# Patient Record
Sex: Male | Born: 1960
Health system: Southern US, Community
[De-identification: ages and names within clinical notes are randomized; demographics above are authoritative.]

## PROBLEM LIST (undated history)

## (undated) DIAGNOSIS — I4891 Unspecified atrial fibrillation: Secondary | ICD-10-CM

## (undated) DIAGNOSIS — I4819 Other persistent atrial fibrillation: Secondary | ICD-10-CM

## (undated) DIAGNOSIS — N529 Male erectile dysfunction, unspecified: Secondary | ICD-10-CM

## (undated) DIAGNOSIS — R072 Precordial pain: Secondary | ICD-10-CM

## (undated) DIAGNOSIS — Z79899 Other long term (current) drug therapy: Secondary | ICD-10-CM

## (undated) DIAGNOSIS — Z5181 Encounter for therapeutic drug level monitoring: Secondary | ICD-10-CM

## (undated) DIAGNOSIS — K219 Gastro-esophageal reflux disease without esophagitis: Secondary | ICD-10-CM

## (undated) DIAGNOSIS — E876 Hypokalemia: Secondary | ICD-10-CM

## (undated) DIAGNOSIS — J302 Other seasonal allergic rhinitis: Secondary | ICD-10-CM

## (undated) DIAGNOSIS — I10 Essential (primary) hypertension: Secondary | ICD-10-CM

## (undated) DIAGNOSIS — R42 Dizziness and giddiness: Secondary | ICD-10-CM

## (undated) DIAGNOSIS — I4892 Unspecified atrial flutter: Secondary | ICD-10-CM

## (undated) DIAGNOSIS — E785 Hyperlipidemia, unspecified: Secondary | ICD-10-CM

## (undated) DIAGNOSIS — M1990 Unspecified osteoarthritis, unspecified site: Secondary | ICD-10-CM

## (undated) DIAGNOSIS — K648 Other hemorrhoids: Secondary | ICD-10-CM

## (undated) DIAGNOSIS — R001 Bradycardia, unspecified: Secondary | ICD-10-CM

## (undated) DIAGNOSIS — G4730 Sleep apnea, unspecified: Secondary | ICD-10-CM

## (undated) HISTORY — DX: Other seasonal allergic rhinitis: J30.2

## (undated) HISTORY — DX: Hypokalemia: E87.6

## (undated) HISTORY — DX: Unspecified atrial flutter: I48.92

## (undated) HISTORY — DX: Sleep apnea, unspecified: G47.30

## (undated) HISTORY — PX: ELBOW SURGERY: SHX618

## (undated) HISTORY — DX: Hyperlipidemia, unspecified: E78.5

## (undated) HISTORY — DX: Essential (primary) hypertension: I10

## (undated) HISTORY — DX: Gastro-esophageal reflux disease without esophagitis: K21.9

## (undated) HISTORY — PX: CHALAZION EXCISION: SHX213

## (undated) HISTORY — DX: Dizziness and giddiness: R42

## (undated) HISTORY — DX: Unspecified atrial fibrillation: I48.91

## (undated) HISTORY — DX: Other hemorrhoids: K64.8

## (undated) HISTORY — DX: Male erectile dysfunction, unspecified: N52.9

## (undated) HISTORY — DX: Precordial pain: R07.2

---

## 1998-03-31 ENCOUNTER — Encounter: Payer: Self-pay | Admitting: Family Medicine

## 1998-03-31 ENCOUNTER — Ambulatory Visit (HOSPITAL_COMMUNITY): Admission: RE | Admit: 1998-03-31 | Discharge: 1998-03-31 | Payer: Self-pay | Admitting: Family Medicine

## 1999-06-04 ENCOUNTER — Encounter: Payer: Self-pay | Admitting: Emergency Medicine

## 1999-06-04 ENCOUNTER — Emergency Department (HOSPITAL_COMMUNITY): Admission: EM | Admit: 1999-06-04 | Discharge: 1999-06-04 | Payer: Self-pay | Admitting: Emergency Medicine

## 2003-08-31 LAB — HM COLONOSCOPY: HM Colonoscopy: NORMAL

## 2004-07-13 ENCOUNTER — Ambulatory Visit: Payer: Self-pay | Admitting: Internal Medicine

## 2004-07-20 ENCOUNTER — Ambulatory Visit: Payer: Self-pay | Admitting: Internal Medicine

## 2004-07-31 ENCOUNTER — Ambulatory Visit: Payer: Self-pay

## 2004-08-06 ENCOUNTER — Ambulatory Visit: Payer: Self-pay | Admitting: Internal Medicine

## 2004-08-10 ENCOUNTER — Ambulatory Visit: Payer: Self-pay

## 2004-08-10 ENCOUNTER — Ambulatory Visit: Payer: Self-pay | Admitting: Internal Medicine

## 2004-08-14 ENCOUNTER — Ambulatory Visit: Payer: Self-pay | Admitting: Cardiology

## 2004-08-22 ENCOUNTER — Ambulatory Visit: Payer: Self-pay | Admitting: Cardiovascular Disease

## 2004-08-29 ENCOUNTER — Ambulatory Visit: Payer: Self-pay | Admitting: Cardiology

## 2004-09-05 ENCOUNTER — Ambulatory Visit: Payer: Self-pay | Admitting: *Deleted

## 2004-09-19 ENCOUNTER — Ambulatory Visit: Payer: Self-pay | Admitting: Internal Medicine

## 2004-10-03 ENCOUNTER — Ambulatory Visit: Payer: Self-pay | Admitting: *Deleted

## 2004-10-17 ENCOUNTER — Ambulatory Visit: Payer: Self-pay | Admitting: Cardiology

## 2004-11-13 ENCOUNTER — Ambulatory Visit: Payer: Self-pay | Admitting: Internal Medicine

## 2004-11-15 ENCOUNTER — Ambulatory Visit (HOSPITAL_COMMUNITY): Admission: RE | Admit: 2004-11-15 | Discharge: 2004-11-15 | Payer: Self-pay | Admitting: Internal Medicine

## 2004-11-20 ENCOUNTER — Ambulatory Visit: Payer: Self-pay | Admitting: Internal Medicine

## 2004-12-06 ENCOUNTER — Ambulatory Visit: Payer: Self-pay | Admitting: Internal Medicine

## 2004-12-13 ENCOUNTER — Ambulatory Visit: Payer: Self-pay | Admitting: Internal Medicine

## 2004-12-20 ENCOUNTER — Ambulatory Visit: Payer: Self-pay

## 2004-12-27 ENCOUNTER — Ambulatory Visit: Payer: Self-pay | Admitting: Internal Medicine

## 2005-01-01 ENCOUNTER — Ambulatory Visit: Payer: Self-pay | Admitting: Internal Medicine

## 2005-01-08 ENCOUNTER — Ambulatory Visit: Payer: Self-pay | Admitting: Internal Medicine

## 2005-02-19 ENCOUNTER — Ambulatory Visit: Payer: Self-pay | Admitting: Internal Medicine

## 2005-03-05 ENCOUNTER — Ambulatory Visit: Payer: Self-pay | Admitting: *Deleted

## 2005-03-26 ENCOUNTER — Ambulatory Visit: Payer: Self-pay | Admitting: Cardiology

## 2005-04-08 ENCOUNTER — Ambulatory Visit: Payer: Self-pay | Admitting: Internal Medicine

## 2005-04-09 ENCOUNTER — Ambulatory Visit: Payer: Self-pay | Admitting: Cardiology

## 2005-04-09 ENCOUNTER — Ambulatory Visit: Payer: Self-pay

## 2005-04-19 ENCOUNTER — Ambulatory Visit: Payer: Self-pay | Admitting: Cardiology

## 2005-05-24 ENCOUNTER — Ambulatory Visit: Payer: Self-pay | Admitting: Internal Medicine

## 2005-06-07 ENCOUNTER — Ambulatory Visit: Payer: Self-pay | Admitting: Cardiology

## 2005-06-21 ENCOUNTER — Ambulatory Visit: Payer: Self-pay | Admitting: Cardiology

## 2005-07-12 ENCOUNTER — Ambulatory Visit: Payer: Self-pay | Admitting: Cardiovascular Disease

## 2005-08-09 ENCOUNTER — Ambulatory Visit: Payer: Self-pay | Admitting: Cardiology

## 2005-09-05 ENCOUNTER — Ambulatory Visit: Payer: Self-pay | Admitting: Cardiology

## 2005-11-18 ENCOUNTER — Ambulatory Visit: Payer: Self-pay | Admitting: Cardiology

## 2005-12-10 ENCOUNTER — Ambulatory Visit: Payer: Self-pay | Admitting: Internal Medicine

## 2005-12-16 ENCOUNTER — Ambulatory Visit: Payer: Self-pay | Admitting: Cardiology

## 2005-12-31 ENCOUNTER — Ambulatory Visit: Payer: Self-pay | Admitting: Internal Medicine

## 2006-01-27 ENCOUNTER — Ambulatory Visit: Payer: Self-pay | Admitting: Cardiology

## 2006-02-24 ENCOUNTER — Ambulatory Visit: Payer: Self-pay | Admitting: Cardiology

## 2006-03-14 ENCOUNTER — Ambulatory Visit: Payer: Self-pay | Admitting: Cardiology

## 2006-03-28 ENCOUNTER — Ambulatory Visit: Payer: Self-pay | Admitting: Internal Medicine

## 2006-05-07 ENCOUNTER — Ambulatory Visit: Payer: Self-pay | Admitting: Cardiology

## 2006-07-17 ENCOUNTER — Ambulatory Visit: Payer: Self-pay | Admitting: Internal Medicine

## 2006-07-19 ENCOUNTER — Ambulatory Visit: Payer: Self-pay | Admitting: Family Medicine

## 2006-08-11 ENCOUNTER — Ambulatory Visit: Payer: Self-pay | Admitting: Internal Medicine

## 2006-09-05 ENCOUNTER — Ambulatory Visit: Payer: Self-pay | Admitting: Internal Medicine

## 2006-09-05 ENCOUNTER — Ambulatory Visit: Payer: Self-pay | Admitting: Cardiology

## 2006-10-15 ENCOUNTER — Ambulatory Visit: Payer: Self-pay | Admitting: Internal Medicine

## 2006-10-21 ENCOUNTER — Ambulatory Visit (HOSPITAL_BASED_OUTPATIENT_CLINIC_OR_DEPARTMENT_OTHER): Admission: RE | Admit: 2006-10-21 | Discharge: 2006-10-21 | Payer: Self-pay | Admitting: General Surgery

## 2006-10-21 ENCOUNTER — Encounter (INDEPENDENT_AMBULATORY_CARE_PROVIDER_SITE_OTHER): Payer: Self-pay | Admitting: General Surgery

## 2006-11-02 HISTORY — PX: INGUINAL HERNIA REPAIR: SUR1180

## 2006-11-03 ENCOUNTER — Ambulatory Visit: Payer: Self-pay | Admitting: Cardiovascular Disease

## 2006-12-09 ENCOUNTER — Ambulatory Visit: Payer: Self-pay | Admitting: Internal Medicine

## 2006-12-09 LAB — CONVERTED CEMR LAB
ALT: 27 units/L (ref 0–53)
AST: 24 units/L (ref 0–37)
Albumin: 4 g/dL (ref 3.5–5.2)
Alkaline Phosphatase: 43 units/L (ref 39–117)
BUN: 14 mg/dL (ref 6–23)
Basophils Absolute: 0.1 10*3/uL (ref 0.0–0.1)
Basophils Relative: 0.7 % (ref 0.0–1.0)
Bilirubin Urine: NEGATIVE
Bilirubin, Direct: 0.2 mg/dL (ref 0.0–0.3)
CO2: 31 meq/L (ref 19–32)
Calcium: 9.1 mg/dL (ref 8.4–10.5)
Chloride: 107 meq/L (ref 96–112)
Cholesterol: 150 mg/dL (ref 0–200)
Creatinine, Ser: 1.2 mg/dL (ref 0.4–1.5)
Direct LDL: 59.4 mg/dL
Eosinophils Absolute: 0.2 10*3/uL (ref 0.0–0.6)
Eosinophils Relative: 1.7 % (ref 0.0–5.0)
GFR calc Af Amer: 83 mL/min
GFR calc non Af Amer: 69 mL/min
Glucose, Bld: 89 mg/dL (ref 70–99)
HCT: 38.1 % — ABNORMAL LOW (ref 39.0–52.0)
HDL: 44.4 mg/dL (ref >39.0)
Hemoglobin, Urine: NEGATIVE
Hemoglobin: 13.3 g/dL (ref 13.0–17.0)
Ketones, ur: NEGATIVE mg/dL
Leukocytes, UA: NEGATIVE
Lymphocytes Relative: 25.5 % (ref 12.0–46.0)
MCHC: 34.9 g/dL (ref 30.0–36.0)
MCV: 87.7 fL (ref 78.0–100.0)
Monocytes Absolute: 1 10*3/uL — ABNORMAL HIGH (ref 0.2–0.7)
Monocytes Relative: 10.1 % (ref 3.0–11.0)
Neutro Abs: 6.4 10*3/uL (ref 1.4–7.7)
Neutrophils Relative %: 62 % (ref 43.0–77.0)
Nitrite: NEGATIVE
PSA: 0.38 ng/mL (ref 0.10–4.00)
Platelets: 233 10*3/uL (ref 150–400)
Potassium: 3.5 meq/L (ref 3.5–5.1)
RBC: 4.34 M/uL (ref 4.22–5.81)
RDW: 12.3 % (ref 11.5–14.6)
Sodium: 140 meq/L (ref 135–145)
Specific Gravity, Urine: 1.02 (ref 1.000–1.03)
TSH: 1.55 microintl units/mL (ref 0.35–5.50)
Total Bilirubin: 1.3 mg/dL — ABNORMAL HIGH (ref 0.3–1.2)
Total CHOL/HDL Ratio: 3.4
Total Protein, Urine: NEGATIVE mg/dL
Total Protein: 7.3 g/dL (ref 6.0–8.3)
Triglycerides: 201 mg/dL (ref 0–149)
Urine Glucose: NEGATIVE mg/dL
Urobilinogen, UA: 1 (ref 0.0–1.0)
VLDL: 40 mg/dL (ref 0–40)
WBC: 10.3 10*3/uL (ref 4.5–10.5)
pH: 8 (ref 5.0–8.0)

## 2006-12-12 ENCOUNTER — Ambulatory Visit: Payer: Self-pay | Admitting: Internal Medicine

## 2007-01-29 ENCOUNTER — Ambulatory Visit: Payer: Self-pay | Admitting: Cardiology

## 2007-04-14 ENCOUNTER — Ambulatory Visit: Payer: Self-pay | Admitting: Internal Medicine

## 2007-04-28 ENCOUNTER — Ambulatory Visit: Payer: Self-pay | Admitting: Cardiovascular Disease

## 2007-05-27 ENCOUNTER — Ambulatory Visit: Payer: Self-pay | Admitting: Internal Medicine

## 2007-05-27 LAB — CONVERTED CEMR LAB
ALT: 49 units/L (ref 0–53)
AST: 25 units/L (ref 0–37)
Albumin: 4.1 g/dL (ref 3.5–5.2)
Alkaline Phosphatase: 44 units/L (ref 39–117)
BUN: 19 mg/dL (ref 6–23)
Bilirubin, Direct: 0.2 mg/dL (ref 0.0–0.3)
CO2: 30 meq/L (ref 19–32)
Calcium: 9.3 mg/dL (ref 8.4–10.5)
Chloride: 102 meq/L (ref 96–112)
Cholesterol: 203 mg/dL (ref 0–200)
Creatinine, Ser: 1 mg/dL (ref 0.4–1.5)
Direct LDL: 94.7 mg/dL
GFR calc Af Amer: 103 mL/min
GFR calc non Af Amer: 85 mL/min
Glucose, Bld: 104 mg/dL — ABNORMAL HIGH (ref 70–99)
HDL: 67.3 mg/dL (ref >39.0)
Potassium: 3.5 meq/L (ref 3.5–5.1)
Sodium: 138 meq/L (ref 135–145)
Total Bilirubin: 1.5 mg/dL — ABNORMAL HIGH (ref 0.3–1.2)
Total CHOL/HDL Ratio: 3
Total Protein: 7.9 g/dL (ref 6.0–8.3)
Triglycerides: 91 mg/dL (ref 0–149)
VLDL: 18 mg/dL (ref 0–40)

## 2007-07-13 ENCOUNTER — Ambulatory Visit: Payer: Self-pay | Admitting: Internal Medicine

## 2007-08-10 ENCOUNTER — Ambulatory Visit: Payer: Self-pay | Admitting: Cardiology

## 2007-09-07 ENCOUNTER — Ambulatory Visit: Payer: Self-pay | Admitting: Cardiovascular Disease

## 2007-09-10 ENCOUNTER — Ambulatory Visit: Payer: Self-pay | Admitting: Internal Medicine

## 2007-09-21 ENCOUNTER — Emergency Department (HOSPITAL_COMMUNITY): Admission: EM | Admit: 2007-09-21 | Discharge: 2007-09-21 | Payer: Self-pay | Admitting: Emergency Medicine

## 2007-10-23 ENCOUNTER — Telehealth (INDEPENDENT_AMBULATORY_CARE_PROVIDER_SITE_OTHER): Payer: Self-pay | Admitting: *Deleted

## 2007-12-11 ENCOUNTER — Ambulatory Visit: Payer: Self-pay | Admitting: Internal Medicine

## 2007-12-11 LAB — CONVERTED CEMR LAB
ALT: 37 units/L (ref 0–53)
AST: 23 units/L (ref 0–37)
Albumin: 4.1 g/dL (ref 3.5–5.2)
Alkaline Phosphatase: 39 units/L (ref 39–117)
BUN: 20 mg/dL (ref 6–23)
Basophils Absolute: 0.1 10*3/uL (ref 0.0–0.1)
Basophils Relative: 1 % (ref 0.0–1.0)
Bilirubin Urine: NEGATIVE
Bilirubin, Direct: 0.2 mg/dL (ref 0.0–0.3)
CO2: 28 meq/L (ref 19–32)
Calcium: 9.2 mg/dL (ref 8.4–10.5)
Chloride: 104 meq/L (ref 96–112)
Cholesterol: 135 mg/dL (ref 0–200)
Creatinine, Ser: 1.2 mg/dL (ref 0.4–1.5)
Eosinophils Absolute: 0.2 10*3/uL (ref 0.0–0.7)
Eosinophils Relative: 1.7 % (ref 0.0–5.0)
GFR calc Af Amer: 83 mL/min
GFR calc non Af Amer: 69 mL/min
Glucose, Bld: 96 mg/dL (ref 70–99)
HCT: 38.6 % — ABNORMAL LOW (ref 39.0–52.0)
HDL: 43.5 mg/dL (ref >39.0)
Hemoglobin: 13.2 g/dL (ref 13.0–17.0)
Ketones, ur: NEGATIVE mg/dL
LDL Cholesterol: 69 mg/dL (ref 0–99)
Leukocytes, UA: NEGATIVE
Lymphocytes Relative: 24 % (ref 12.0–46.0)
MCHC: 34.2 g/dL (ref 30.0–36.0)
MCV: 89.3 fL (ref 78.0–100.0)
Monocytes Absolute: 0.8 10*3/uL (ref 0.1–1.0)
Monocytes Relative: 8.7 % (ref 3.0–12.0)
Neutro Abs: 5.7 10*3/uL (ref 1.4–7.7)
Neutrophils Relative %: 64.6 % (ref 43.0–77.0)
Nitrite: NEGATIVE
PSA: 0.4 ng/mL (ref 0.10–4.00)
Platelets: 215 10*3/uL (ref 150–400)
Potassium: 3.4 meq/L — ABNORMAL LOW (ref 3.5–5.1)
RBC: 4.32 M/uL (ref 4.22–5.81)
RDW: 12.4 % (ref 11.5–14.6)
Sodium: 138 meq/L (ref 135–145)
Specific Gravity, Urine: >=1.03 (ref 1.000–1.03)
TSH: 0.9 microintl units/mL (ref 0.35–5.50)
Total Bilirubin: 1.3 mg/dL — ABNORMAL HIGH (ref 0.3–1.2)
Total CHOL/HDL Ratio: 3.1
Total Protein: 7.2 g/dL (ref 6.0–8.3)
Triglycerides: 113 mg/dL (ref 0–149)
Urine Glucose: NEGATIVE mg/dL
Urobilinogen, UA: 1 (ref 0.0–1.0)
VLDL: 23 mg/dL (ref 0–40)
WBC: 9 10*3/uL (ref 4.5–10.5)
pH: 6 (ref 5.0–8.0)

## 2007-12-15 ENCOUNTER — Ambulatory Visit: Payer: Self-pay | Admitting: Internal Medicine

## 2007-12-15 DIAGNOSIS — E876 Hypokalemia: Secondary | ICD-10-CM | POA: Insufficient documentation

## 2007-12-15 DIAGNOSIS — I10 Essential (primary) hypertension: Secondary | ICD-10-CM | POA: Insufficient documentation

## 2007-12-15 DIAGNOSIS — E785 Hyperlipidemia, unspecified: Secondary | ICD-10-CM | POA: Insufficient documentation

## 2007-12-15 HISTORY — DX: Hyperlipidemia, unspecified: E78.5

## 2007-12-15 HISTORY — DX: Essential (primary) hypertension: I10

## 2007-12-15 HISTORY — DX: Hypokalemia: E87.6

## 2007-12-17 ENCOUNTER — Ambulatory Visit: Payer: Self-pay | Admitting: Cardiology

## 2007-12-31 ENCOUNTER — Ambulatory Visit: Payer: Self-pay | Admitting: Internal Medicine

## 2007-12-31 ENCOUNTER — Telehealth (INDEPENDENT_AMBULATORY_CARE_PROVIDER_SITE_OTHER): Payer: Self-pay | Admitting: *Deleted

## 2008-01-01 ENCOUNTER — Ambulatory Visit: Payer: Self-pay | Admitting: Internal Medicine

## 2008-01-01 DIAGNOSIS — R1013 Epigastric pain: Secondary | ICD-10-CM | POA: Insufficient documentation

## 2008-01-01 LAB — CONVERTED CEMR LAB: H Pylori IgG: NEGATIVE

## 2008-01-12 ENCOUNTER — Ambulatory Visit: Payer: Self-pay | Admitting: Internal Medicine

## 2008-01-26 ENCOUNTER — Ambulatory Visit: Payer: Self-pay | Admitting: Cardiology

## 2008-02-16 ENCOUNTER — Ambulatory Visit: Payer: Self-pay | Admitting: Cardiology

## 2008-03-08 ENCOUNTER — Ambulatory Visit: Payer: Self-pay | Admitting: Cardiovascular Disease

## 2008-03-08 ENCOUNTER — Ambulatory Visit: Payer: Self-pay | Admitting: Internal Medicine

## 2008-03-22 ENCOUNTER — Encounter: Payer: Self-pay | Admitting: Internal Medicine

## 2008-04-05 ENCOUNTER — Ambulatory Visit: Payer: Self-pay | Admitting: Internal Medicine

## 2008-04-14 ENCOUNTER — Ambulatory Visit: Payer: Self-pay | Admitting: Cardiology

## 2008-05-03 ENCOUNTER — Ambulatory Visit: Payer: Self-pay | Admitting: Cardiology

## 2008-05-24 ENCOUNTER — Ambulatory Visit: Payer: Self-pay | Admitting: Internal Medicine

## 2008-06-08 ENCOUNTER — Ambulatory Visit: Payer: Self-pay | Admitting: Cardiology

## 2008-06-27 ENCOUNTER — Ambulatory Visit: Payer: Self-pay | Admitting: Internal Medicine

## 2008-07-06 ENCOUNTER — Ambulatory Visit: Payer: Self-pay | Admitting: Internal Medicine

## 2008-07-06 DIAGNOSIS — R42 Dizziness and giddiness: Secondary | ICD-10-CM

## 2008-07-06 HISTORY — DX: Dizziness and giddiness: R42

## 2008-07-11 ENCOUNTER — Ambulatory Visit: Payer: Self-pay | Admitting: Cardiology

## 2008-08-01 ENCOUNTER — Ambulatory Visit: Payer: Self-pay | Admitting: Cardiology

## 2008-08-29 ENCOUNTER — Ambulatory Visit: Payer: Self-pay | Admitting: Cardiology

## 2008-09-12 ENCOUNTER — Ambulatory Visit: Payer: Self-pay | Admitting: Internal Medicine

## 2008-09-12 ENCOUNTER — Encounter: Payer: Self-pay | Admitting: Internal Medicine

## 2008-09-26 ENCOUNTER — Ambulatory Visit: Payer: Self-pay | Admitting: Cardiology

## 2008-10-11 ENCOUNTER — Ambulatory Visit: Payer: Self-pay | Admitting: Internal Medicine

## 2008-11-01 ENCOUNTER — Ambulatory Visit: Payer: Self-pay | Admitting: Cardiology

## 2008-11-01 ENCOUNTER — Encounter: Payer: Self-pay | Admitting: *Deleted

## 2008-11-01 LAB — CONVERTED CEMR LAB
POC INR: 2.8
Protime: 20.4

## 2008-11-22 ENCOUNTER — Ambulatory Visit: Payer: Self-pay | Admitting: Cardiovascular Disease

## 2008-11-22 LAB — CONVERTED CEMR LAB
POC INR: 2.7
Protime: 19.8

## 2008-12-07 ENCOUNTER — Encounter: Payer: Self-pay | Admitting: *Deleted

## 2008-12-20 ENCOUNTER — Ambulatory Visit: Payer: Self-pay | Admitting: Cardiology

## 2008-12-20 LAB — CONVERTED CEMR LAB: POC INR: 3.1

## 2009-01-09 ENCOUNTER — Ambulatory Visit: Payer: Self-pay | Admitting: Internal Medicine

## 2009-01-09 LAB — CONVERTED CEMR LAB
ALT: 31 units/L (ref 0–53)
AST: 23 units/L (ref 0–37)
Albumin: 4.1 g/dL (ref 3.5–5.2)
Alkaline Phosphatase: 53 units/L (ref 39–117)
BUN: 20 mg/dL (ref 6–23)
Basophils Absolute: 0 10*3/uL (ref 0.0–0.1)
Basophils Relative: 0.3 % (ref 0.0–3.0)
Bilirubin Urine: NEGATIVE
CO2: 28 meq/L (ref 19–32)
Calcium: 8.9 mg/dL (ref 8.4–10.5)
Chloride: 110 meq/L (ref 96–112)
Creatinine, Ser: 1 mg/dL (ref 0.4–1.5)
Eosinophils Relative: 1.9 % (ref 0.0–5.0)
HCT: 42 % (ref 39.0–52.0)
Hemoglobin: 14.4 g/dL (ref 13.0–17.0)
Ketones, ur: NEGATIVE mg/dL
LDL Cholesterol: 63 mg/dL (ref 0–99)
Lymphocytes Relative: 26.8 % (ref 12.0–46.0)
Lymphs Abs: 2.4 10*3/uL (ref 0.7–4.0)
Monocytes Relative: 10 % (ref 3.0–12.0)
Neutro Abs: 5.5 10*3/uL (ref 1.4–7.7)
RBC: 4.73 M/uL (ref 4.22–5.81)
Specific Gravity, Urine: >=1.03 (ref 1.000–1.030)
Total CHOL/HDL Ratio: 2
Urine Glucose: NEGATIVE mg/dL
Urobilinogen, UA: 0.2 (ref 0.0–1.0)
WBC: 9 10*3/uL (ref 4.5–10.5)

## 2009-01-10 ENCOUNTER — Encounter: Payer: Self-pay | Admitting: Cardiology

## 2009-01-12 ENCOUNTER — Ambulatory Visit: Payer: Self-pay | Admitting: Internal Medicine

## 2009-01-17 ENCOUNTER — Ambulatory Visit: Payer: Self-pay | Admitting: Cardiovascular Disease

## 2009-01-17 LAB — CONVERTED CEMR LAB: POC INR: 2.7

## 2009-02-13 ENCOUNTER — Ambulatory Visit: Payer: Self-pay | Admitting: Cardiovascular Disease

## 2009-02-13 LAB — CONVERTED CEMR LAB: POC INR: 2.6

## 2009-03-13 ENCOUNTER — Ambulatory Visit: Payer: Self-pay | Admitting: Internal Medicine

## 2009-04-12 ENCOUNTER — Ambulatory Visit: Payer: Self-pay | Admitting: Internal Medicine

## 2009-04-12 LAB — CONVERTED CEMR LAB: POC INR: 2.6

## 2009-05-10 ENCOUNTER — Ambulatory Visit: Payer: Self-pay | Admitting: Cardiology

## 2009-05-10 LAB — CONVERTED CEMR LAB: POC INR: 2.1

## 2009-05-25 ENCOUNTER — Ambulatory Visit: Payer: Self-pay | Admitting: Internal Medicine

## 2009-05-25 DIAGNOSIS — R072 Precordial pain: Secondary | ICD-10-CM | POA: Insufficient documentation

## 2009-05-25 HISTORY — DX: Precordial pain: R07.2

## 2009-06-07 ENCOUNTER — Ambulatory Visit: Payer: Self-pay | Admitting: Cardiovascular Disease

## 2009-06-07 LAB — CONVERTED CEMR LAB: POC INR: 1.8

## 2009-07-06 ENCOUNTER — Ambulatory Visit: Payer: Self-pay

## 2009-07-06 ENCOUNTER — Ambulatory Visit: Payer: Self-pay | Admitting: Internal Medicine

## 2009-07-06 ENCOUNTER — Ambulatory Visit: Payer: Self-pay | Admitting: Cardiology

## 2009-07-27 ENCOUNTER — Ambulatory Visit: Payer: Self-pay | Admitting: Cardiology

## 2009-07-27 LAB — CONVERTED CEMR LAB: POC INR: 2.4

## 2009-08-15 ENCOUNTER — Telehealth: Payer: Self-pay | Admitting: Internal Medicine

## 2009-08-23 ENCOUNTER — Ambulatory Visit: Payer: Self-pay | Admitting: Internal Medicine

## 2009-09-25 ENCOUNTER — Ambulatory Visit: Payer: Self-pay | Admitting: Cardiovascular Disease

## 2009-10-25 ENCOUNTER — Ambulatory Visit: Payer: Self-pay | Admitting: Internal Medicine

## 2009-12-20 ENCOUNTER — Telehealth (INDEPENDENT_AMBULATORY_CARE_PROVIDER_SITE_OTHER): Payer: Self-pay | Admitting: *Deleted

## 2009-12-20 ENCOUNTER — Ambulatory Visit: Payer: Self-pay | Admitting: Cardiology

## 2009-12-20 LAB — CONVERTED CEMR LAB: POC INR: 2.6

## 2010-01-12 ENCOUNTER — Ambulatory Visit: Payer: Self-pay | Admitting: Internal Medicine

## 2010-01-16 ENCOUNTER — Ambulatory Visit: Payer: Self-pay | Admitting: Internal Medicine

## 2010-01-16 DIAGNOSIS — N529 Male erectile dysfunction, unspecified: Secondary | ICD-10-CM

## 2010-01-16 HISTORY — DX: Male erectile dysfunction, unspecified: N52.9

## 2010-01-16 LAB — CONVERTED CEMR LAB: POC INR: 2.3

## 2010-01-19 ENCOUNTER — Ambulatory Visit: Payer: Self-pay | Admitting: Internal Medicine

## 2010-01-19 LAB — CONVERTED CEMR LAB
Albumin: 4.2 g/dL (ref 3.5–5.2)
Alkaline Phosphatase: 50 units/L (ref 39–117)
Basophils Absolute: 0 10*3/uL (ref 0.0–0.1)
Bilirubin Urine: NEGATIVE
Bilirubin, Direct: 0.1 mg/dL (ref 0.0–0.3)
Chloride: 110 meq/L (ref 96–112)
Cholesterol: 141 mg/dL (ref 0–200)
Eosinophils Absolute: 0.1 10*3/uL (ref 0.0–0.7)
LDL Cholesterol: 65 mg/dL (ref 0–99)
Leukocytes, UA: NEGATIVE
Lymphocytes Relative: 25.7 % (ref 12.0–46.0)
MCHC: 34.5 g/dL (ref 30.0–36.0)
Neutrophils Relative %: 63.6 % (ref 43.0–77.0)
Nitrite: NEGATIVE
Platelets: 222 10*3/uL (ref 150.0–400.0)
Potassium: 3.9 meq/L (ref 3.5–5.1)
RDW: 13.5 % (ref 11.5–14.6)
Specific Gravity, Urine: >=1.03 (ref 1.000–1.030)
Testosterone: 462.72 ng/dL (ref 350.00–890.00)
Triglycerides: 133 mg/dL (ref 0.0–149.0)
VLDL: 26.6 mg/dL (ref 0.0–40.0)
pH: 5.5 (ref 5.0–8.0)

## 2010-01-24 ENCOUNTER — Ambulatory Visit: Payer: Self-pay | Admitting: Internal Medicine

## 2010-02-13 ENCOUNTER — Ambulatory Visit: Payer: Self-pay | Admitting: Cardiology

## 2010-03-08 ENCOUNTER — Telehealth: Payer: Self-pay | Admitting: Internal Medicine

## 2010-03-13 ENCOUNTER — Ambulatory Visit: Payer: Self-pay | Admitting: Internal Medicine

## 2010-03-13 LAB — CONVERTED CEMR LAB: POC INR: 2.2

## 2010-03-19 ENCOUNTER — Telehealth: Payer: Self-pay | Admitting: Internal Medicine

## 2010-04-10 ENCOUNTER — Ambulatory Visit: Payer: Self-pay | Admitting: Cardiology

## 2010-04-10 LAB — CONVERTED CEMR LAB: POC INR: 1.9

## 2010-05-09 ENCOUNTER — Ambulatory Visit: Payer: Self-pay | Admitting: Internal Medicine

## 2010-05-09 LAB — CONVERTED CEMR LAB: POC INR: 2.2

## 2010-05-29 ENCOUNTER — Ambulatory Visit: Payer: Self-pay | Admitting: Internal Medicine

## 2010-06-26 ENCOUNTER — Ambulatory Visit: Admit: 2010-06-26 | Payer: Self-pay

## 2010-06-27 ENCOUNTER — Ambulatory Visit
Admission: RE | Admit: 2010-06-27 | Discharge: 2010-06-27 | Payer: Self-pay | Source: Home / Self Care | Attending: Cardiology | Admitting: Cardiology

## 2010-06-27 LAB — CONVERTED CEMR LAB
INR: 2.4
POC INR: 2.4

## 2010-07-03 NOTE — Progress Notes (Signed)
Summary: Levitra ?  Phone Note Call from Patient Call back at Work Phone [phone]   Caller: Patient Summary of Call: pt called stating he would like Levitra sent to [facility] [address]  in place of Cialis.  He states he talked to you about it 2 wks ago but pharm is telling him they never got it.  Initial call taken by: Lanier Prude, [employer]),  March 19, 2010 12:34 PM  Follow-up for Phone Call        Rx for Cialis was faxed instead of requested medication, Levitra  * see phone note 03/08/2010*. Okay to Rx Levitra for pt? Follow-up by: Margaret Pyle, CMA,  March 19, 2010 1:29 PM  Additional Follow-up for Phone Call Additional follow up Details #1::        done per emr Additional Follow-up by: Corwin Levins MD,  March 19, 2010 2:12 PM    Additional Follow-up for Phone Call Additional follow up Details #2::    Pt advised Follow-up by: Margaret Pyle, CMA,  March 19, 2010 3:42 PM  New/Updated Medications: LEVITRA 20 MG TABS (VARDENAFIL HCL) 1po every other day as needed Prescriptions: LEVITRA 20 MG TABS (VARDENAFIL HCL) 1po every other day as needed  #5 x 11   Entered and Authorized by:   Corwin Levins MD   Signed by:   Corwin Levins MD on 03/19/2010   Method used:   Electronically to        [facility] [phone]* (retail)       [address]       [place], [place]  [address]       Ph: [phone]       Fax: [phone]   RxID:   [id number]

## 2010-07-03 NOTE — Medication Information (Signed)
Summary: rov/ewj  Anticoagulant Therapy  Managed by: Bethena Midget, RN, BSN Referring MD: Arvilla Meres PCP: Corwin Levins MD Supervising MD: Graciela Husbands MD, Viviann Spare Indication 1: Atrial Fibrillation (ICD-427.31) Lab Used: [facility] Site: [facility] INR POC 1.9 INR RANGE 2 - 3  Dietary changes: yes       Details: Slightly more leafy veggies  Health status changes: no    Bleeding/hemorrhagic complications: no    Recent/future hospitalizations: no    Any changes in medication regimen? no    Recent/future dental: no  Any missed doses?: no       Is patient compliant with meds? yes      Comments: Pending eye procedure on Friday where they will scrape tissue under eyelid to clear possible blocked duct.   Allergies: 1)  ! Ibuprofen  Anticoagulation Management History:      The patient is taking warfarin and comes in today for a routine follow up visit.  Negative risk factors for bleeding include an age less than 50 years old and no history of CVA/TIA.  The bleeding index is 'low risk'.  Positive CHADS2 values include History of HTN.  Negative CHADS2 values include Age > 46 years old, History of Diabetes, and Prior Stroke/CVA/TIA.  The start date was 08/10/2004.  Anticoagulation responsible provider: Graciela Husbands MD, Viviann Spare.  INR POC: 1.9.  Cuvette Lot#: [id number].  Exp: 05/2011.    Anticoagulation Management Assessment/Plan:      The patient's current anticoagulation dose is Coumadin 5 mg tabs: Take as directed by coumadin clinic..  The target INR is 2.0-3.0.  The next INR is due 05/08/2010.  Anticoagulation instructions were given to patient.  Results were reviewed/authorized by Bethena Midget, RN, BSN.  He was notified by Bethena Midget, RN, BSN.         Prior Anticoagulation Instructions: INR 2.2  Continue on same dosage 1.5 tablets daily except 1 tablet on Mondays and Fridays.  Recheck in 4 weeks.    Current Anticoagulation Instructions: INR 1.9 Today take 10mg s then resume 7.5mg s daily  except 5mg s on Mondays and Fridays. Recheck in 4 weeks.

## 2010-07-03 NOTE — Medication Information (Signed)
Summary: rov/mlw  Anticoagulant Therapy  Managed by: Weston Brass, PharmD Referring MD: Arvilla Meres PCP: Corwin Levins MD Supervising MD: Gala Romney MD, Reuel Boom Indication 1: Atrial Fibrillation (ICD-427.31) Lab Used: [facility] Site: [facility] INR POC 2.1 INR RANGE 2 - 3  Dietary changes: no    Health status changes: no    Bleeding/hemorrhagic complications: no    Recent/future hospitalizations: no    Any changes in medication regimen? no    Recent/future dental: no  Any missed doses?: no       Is patient compliant with meds? yes       Allergies: 1)  ! Ibuprofen  Anticoagulation Management History:      The patient is taking warfarin and comes in today for a routine follow up visit.  Negative risk factors for bleeding include an age less than 88 years old and no history of CVA/TIA.  The bleeding index is 'low risk'.  Positive CHADS2 values include History of HTN.  Negative CHADS2 values include Age > 35 years old, History of Diabetes, and Prior Stroke/CVA/TIA.  The start date was 08/10/2004.  Anticoagulation responsible provider: Brian Zeitlin MD, Reuel Boom.  INR POC: 2.1.  Exp: 10/2010.    Anticoagulation Management Assessment/Plan:      The patient's current anticoagulation dose is Coumadin 5 mg tabs: Take as directed by coumadin clinic..  The target INR is 2.0-3.0.  The next INR is due 11/22/2009.  Anticoagulation instructions were given to patient.  Results were reviewed/authorized by Weston Brass, PharmD.  He was notified by Weston Brass PharmD.         Prior Anticoagulation Instructions: INR 2.3  The patient is to continue with the same dose of coumadin.  This dosage includes: 1.5 tabs (7.5 mg) daily, except 1 tablet (5 mg) on Mondays and Fridays.   Next INR in 4 weeks: May 23rd at 8:30 am.   Current Anticoagulation Instructions: INR 2.1  Continue same dose of 1 1/2 tablet every day except 1 tablet on Monday and Friday

## 2010-07-03 NOTE — Medication Information (Signed)
Summary: rov/jaj  Anticoagulant Therapy  Managed by: Cloyde Reams, RN, BSN Referring MD: Arvilla Meres PCP: Corwin Levins MD Supervising MD: Gala Romney MD, Reuel Boom Indication 1: Atrial Fibrillation (ICD-427.31) Lab Used: [facility] Site: [facility] INR POC 2.2 INR RANGE 2 - 3  Dietary changes: no    Health status changes: no    Bleeding/hemorrhagic complications: no    Recent/future hospitalizations: no    Any changes in medication regimen? no    Recent/future dental: no  Any missed doses?: no       Is patient compliant with meds? yes       Allergies: 1)  ! Ibuprofen  Anticoagulation Management History:      The patient is taking warfarin and comes in today for a routine follow up visit.  Negative risk factors for bleeding include an age less than 28 years old and no history of CVA/TIA.  The bleeding index is 'low risk'.  Positive CHADS2 values include History of HTN.  Negative CHADS2 values include Age > 36 years old, History of Diabetes, and Prior Stroke/CVA/TIA.  The start date was 08/10/2004.  Anticoagulation responsible provider: Verlyn Lambert MD, Reuel Boom.  INR POC: 2.2.  Cuvette Lot#: [id number].  Exp: 04/2011.    Anticoagulation Management Assessment/Plan:      The patient's current anticoagulation dose is Coumadin 5 mg tabs: Take as directed by coumadin clinic..  The target INR is 2.0-3.0.  The next INR is due 04/10/2010.  Anticoagulation instructions were given to patient.  Results were reviewed/authorized by Cloyde Reams, RN, BSN.  He was notified by Cloyde Reams RN.         Prior Anticoagulation Instructions: INR 3.1  Take only 1 tablet today. Then, continue taking 1 1/2 tablets everyday except take 1 tablet on Mondays and Fridays. Re-check INR in 4 weeks.     Current Anticoagulation Instructions: INR 2.2  Continue on same dosage 1.5 tablets daily except 1 tablet on Mondays and Fridays.  Recheck in 4 weeks.   Prescriptions: COUMADIN 5 MG TABS (WARFARIN  SODIUM) Take as directed by coumadin clinic.  #45 x 3   Entered by:   Cloyde Reams RN   Authorized by:   Dolores Patty, MD, [facility]   Signed by:   Cloyde Reams RN on 03/13/2010   Method used:   Electronically to        [facility] [address] [id number] (retail)       [address]       [place], [place]  [address]       Ph: [phone]       Fax: [phone]   RxID:   [id number]

## 2010-07-03 NOTE — Medication Information (Signed)
Summary: rov/tm  Anticoagulant Therapy  Managed by: Bethena Midget, RN, BSN Referring MD: Arvilla Meres PCP: Corwin Levins MD Supervising MD: Gala Romney MD, Reuel Boom Indication 1: Atrial Fibrillation (ICD-427.31) Lab Used: [facility] Site: [facility] INR POC 2.4 INR RANGE 2 - 3  Dietary changes: no    Health status changes: no    Bleeding/hemorrhagic complications: no    Recent/future hospitalizations: no    Any changes in medication regimen? no    Recent/future dental: no  Any missed doses?: no       Is patient compliant with meds? yes       Allergies: 1)  ! Ibuprofen  Anticoagulation Management History:      The patient is taking warfarin and comes in today for a routine follow up visit.  Negative risk factors for bleeding include an age less than 66 years old and no history of CVA/TIA.  The bleeding index is 'low risk'.  Positive CHADS2 values include History of HTN.  Negative CHADS2 values include Age > 74 years old, History of Diabetes, and Prior Stroke/CVA/TIA.  The start date was 08/10/2004.  Anticoagulation responsible provider: Bensimhon MD, Reuel Boom.  INR POC: 2.4.  Cuvette Lot#: [id number].  Exp: 09/2010.    Anticoagulation Management Assessment/Plan:      The patient's current anticoagulation dose is Coumadin 5 mg tabs: Take as directed by coumadin clinic..  The target INR is 2.0-3.0.  The next INR is due 08/24/2009.  Anticoagulation instructions were given to patient.  Results were reviewed/authorized by Bethena Midget, RN, BSN.  He was notified by Bethena Midget, RN, BSN.         Prior Anticoagulation Instructions: INR 2.0 Change dose to 7.5mg s everyday except 5mg s on Mondays and Fridays. Recheck in 3 weeks.   Current Anticoagulation Instructions: INR 2.4 Continue 7.5mg s daily except 5mg s on Mondays and Fridays. Recheck in 4 weeks.

## 2010-07-03 NOTE — Medication Information (Signed)
Summary: rov/tm  Anticoagulant Therapy  Managed by: Bethena Midget, RN, BSN Referring MD: Arvilla Meres PCP: Corwin Levins MD Supervising MD: Juanda Chance MD, Kirill Chatterjee Indication 1: Atrial Fibrillation (ICD-427.31) Lab Used: [facility] Site: [facility] INR POC 2.0 INR RANGE 2 - 3  Dietary changes: yes       Details: has eating more green leafy vegetables  Health status changes: no    Bleeding/hemorrhagic complications: no    Recent/future hospitalizations: no    Any changes in medication regimen? no    Recent/future dental: no  Any missed doses?: no       Is patient compliant with meds? yes       Allergies: 1)  ! Ibuprofen  Anticoagulation Management History:      The patient is taking warfarin and comes in today for a routine follow up visit.  Negative risk factors for bleeding include an age less than 75 years old.  The bleeding index is 'low risk'.  Positive CHADS2 values include History of HTN.  Negative CHADS2 values include Age > 56 years old.  The start date was 08/10/2004.  Anticoagulation responsible provider: Juanda Chance MD, Smitty Cords.  INR POC: 2.0.  Cuvette Lot#: [id number].  Exp: 09/2010.    Anticoagulation Management Assessment/Plan:      The patient's current anticoagulation dose is Coumadin 5 mg tabs: Take as directed by coumadin clinic..  The target INR is 2.0-3.0.  The next INR is due 07/27/2009.  Anticoagulation instructions were given to patient.  Results were reviewed/authorized by Bethena Midget, RN, BSN.  He was notified by Bethena Midget, RN, BSN.         Prior Anticoagulation Instructions: INR 1.8 Today take 7.5mg s then resume 7.5mg s everyday except 5mg s on Mondays, Wednesdays, and Fridays. Recheck in 3-4weeks.   Current Anticoagulation Instructions: INR 2.0 Change dose to 7.5mg s everyday except 5mg s on Mondays and Fridays. Recheck in 3 weeks.

## 2010-07-03 NOTE — Progress Notes (Signed)
Summary: med change?  Phone Note Call from Patient Call back at Work Phone [phone]   Caller: Patient Summary of Call: Pt states that he cannot afford Cialis and would like to try Levitra instead-please advise Initial call taken by: Brenton Grills MA,  March 08, 2010 4:45 PM  Follow-up for Phone Call        ok to change  done hardcopy to LIM side B - dahlia  Follow-up by: Corwin Levins MD,  March 08, 2010 4:48 PM  Additional Follow-up for Phone Call Additional follow up Details #1::        Rx faxed to [facility] [address] , pt informed via VM Additional Follow-up by: Margaret Pyle, CMA,  March 08, 2010 5:00 PM    Prescriptions: CIALIS 20 MG TABS (TADALAFIL) 1 by mouth once daily as needed  #5 x 11   Entered and Authorized by:   Corwin Levins MD   Signed by:   Corwin Levins MD on 03/08/2010   Method used:   Print then Give to Patient   RxID:   [id number]

## 2010-07-03 NOTE — Medication Information (Signed)
Summary: ccr/jml      Allergies Added:  Anticoagulant Therapy  Managed by: Lynann Bologna, PharmD Referring MD: Arvilla Meres PCP: Corwin Levins MD Supervising MD: Excell Seltzer MD, Casimiro Needle Indication 1: Atrial Fibrillation (ICD-427.31) Lab Used: [facility] Site: [facility] INR POC 2.3 INR RANGE 2 - 3  Dietary changes: no    Health status changes: no    Bleeding/hemorrhagic complications: no    Recent/future hospitalizations: no    Any changes in medication regimen? no    Recent/future dental: no  Any missed doses?: no       Is patient compliant with meds? yes       Current Medications (verified): 1)  Cialis 20 Mg Tabs (Tadalafil) .Marland Kitchen.. 1 By Mouth Once Daily As Needed 2)  Flecainide Acetate 100 Mg Tabs (Flecainide Acetate) .... Take 1 1/2  Tablet Twice A Day 3)  Losartan Potassium 100 Mg Tabs (Losartan Potassium) .Marland Kitchen.. 1po Once Daily 4)  Lipitor 20 Mg Tabs (Atorvastatin Calcium) .Marland Kitchen.. 1 By Mouth Once Daily 5)  Coumadin 5 Mg Tabs (Warfarin Sodium) .... Take As Directed By Coumadin Clinic. 6)  Aspirin 81 Mg Tbec (Aspirin) .Marland Kitchen.. 1 By Mouth Once Daily 7)  Amlodipine Besylate 2.5 Mg Tabs (Amlodipine Besylate) .... Take One Tablet By Mouth Daily 8)  Nexium 40 Mg Cpdr (Esomeprazole Magnesium) .... Once Daily  Allergies (verified): 1)  ! Ibuprofen  Anticoagulation Management History:      The patient is taking warfarin and comes in today for a routine follow up visit.  Negative risk factors for bleeding include an age less than 10 years old and no history of CVA/TIA.  The bleeding index is 'low risk'.  Positive CHADS2 values include History of HTN.  Negative CHADS2 values include Age > 22 years old, History of Diabetes, and Prior Stroke/CVA/TIA.  The start date was 08/10/2004.  Anticoagulation responsible provider: Excell Seltzer MD, Casimiro Needle.  INR POC: 2.3.  Cuvette Lot#: [id number].  Exp: 10/2010.    Anticoagulation Management Assessment/Plan:      The patient's current anticoagulation  dose is Coumadin 5 mg tabs: Take as directed by coumadin clinic..  The target INR is 2.0-3.0.  The next INR is due 10/23/2009.  Anticoagulation instructions were given to patient.  Results were reviewed/authorized by Lynann Bologna, PharmD.  He was notified by Lynann Bologna.         Prior Anticoagulation Instructions: INR 1.9 Today take 10mg s then resume 7.5mg s daily except 5mg s on Mondays and Fridays. Recheck in 4 weeks.   Current Anticoagulation Instructions: INR 2.3  The patient is to continue with the same dose of coumadin.  This dosage includes: 1.5 tabs (7.5 mg) daily, except 1 tablet (5 mg) on Mondays and Fridays.   Next INR in 4 weeks: May 23rd at 8:30 am.

## 2010-07-03 NOTE — Assessment & Plan Note (Signed)
Summary: CPX/BCBS/#/CD   Vital Signs:  Patient profile:   50 year old male Height:      72.5 inches Weight:      192 pounds BMI:     25.77 O2 Sat:      98 % on Room air Temp:     98.1 degrees F oral Pulse rate:   112 / minute BP sitting:   122 / 90  (left arm) Cuff size:   regular  Vitals Entered By: Zella Ball Ewing CMA Duncan Dull) (January 24, 2010 2:12 PM)  O2 Flow:  Room air  CC: Adult Physical/RE   Primary Care Provider:  Corwin Levins MD  CC:  Adult Physical/RE.  History of Present Illness: here to f/u - overall doing well;  Pt denies CP, worsening sob, doe, wheezing, orthopnea, pnd, worsening LE edema, palps, dizziness or syncope  Pt denies new neuro symptoms such as headache, facial or extremity weakness  No fever, wt loss, night sweats, loss of appetite or other constitutional symptoms Overall good med compliacne and good tolerance.    Problems Prior to Update: 1)  Erectile Dysfunction, Organic  (ICD-607.84) 2)  Chest Pain-precordial  (ICD-786.51) 3)  Postural Lightheadedness  (ICD-780.4) 4)  Abdominal Pain, Epigastric  (ICD-789.06) 5)  Hypokalemia  (ICD-276.8) 6)  Preventive Health Care  (ICD-V70.0) 7)  Anticoagulation Therapy  (ICD-V58.61) 8)  Hypertension  (ICD-401.9) 9)  Hyperlipidemia  (ICD-272.4) 10)  Atrial Fibrillation  (ICD-427.31)  Medications Prior to Update: 1)  Cialis 20 Mg Tabs (Tadalafil) .Marland Kitchen.. 1 By Mouth Once Daily As Needed 2)  Flecainide Acetate 100 Mg Tabs (Flecainide Acetate) .... Take 1 1/2  Tablet Twice A Day 3)  Losartan Potassium 100 Mg Tabs (Losartan Potassium) .Marland Kitchen.. 1po Once Daily 4)  Lipitor 20 Mg Tabs (Atorvastatin Calcium) .Marland Kitchen.. 1 By Mouth Once Daily 5)  Coumadin 5 Mg Tabs (Warfarin Sodium) .... Take As Directed By Coumadin Clinic. 6)  Aspirin 81 Mg Tbec (Aspirin) .Marland Kitchen.. 1 By Mouth Once Daily 7)  Amlodipine Besylate 2.5 Mg Tabs (Amlodipine Besylate) .... Take One Tablet By Mouth Daily 8)  Cardura 1 Mg Tabs (Doxazosin Mesylate) .... Every  Evening  Current Medications (verified): 1)  Cialis 20 Mg Tabs (Tadalafil) .Marland Kitchen.. 1 By Mouth Once Daily As Needed 2)  Flecainide Acetate 100 Mg Tabs (Flecainide Acetate) .... Take 1 1/2  Tablet Twice A Day 3)  Losartan Potassium 100 Mg Tabs (Losartan Potassium) .Marland Kitchen.. 1po Once Daily 4)  Lipitor 20 Mg Tabs (Atorvastatin Calcium) .Marland Kitchen.. 1 By Mouth Once Daily 5)  Coumadin 5 Mg Tabs (Warfarin Sodium) .... Take As Directed By Coumadin Clinic. 6)  Aspirin 81 Mg Tbec (Aspirin) .Marland Kitchen.. 1 By Mouth Once Daily 7)  Amlodipine Besylate 2.5 Mg Tabs (Amlodipine Besylate) .... Take One Tablet By Mouth Daily 8)  Cardura 1 Mg Tabs (Doxazosin Mesylate) .... Every Evening  Allergies (verified): 1)  ! Ibuprofen  Past History:  Past Medical History: Last updated: 05/25/2009 1.Atrial fibrillation - complicated by sick sinus syndrome 2. Hyperlipidemia 3. Hypertension 4. Anticoagulation therapy 5. Erectil dysfunction 6. cervical disc disease 7. glucose intolerance 8. 1st degree AV block 9. 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         all otherwise negative per pt -  except for occasional  forgetfulness  Physical Exam  General:  alert and well-developed.   Head:  normocephalic and atraumatic.   Eyes:  vision grossly intact, pupils equal, and pupils round.   Ears:  R ear normal and L ear normal.   Nose:  no external deformity and no nasal discharge.   Mouth:  no gingival abnormalities and pharynx pink and moist.   Neck:  supple and no masses.   Lungs:  normal respiratory effort and normal breath sounds.   Heart:  normal rate and regular rhythm.   Abdomen:  soft, non-tender, and normal bowel sounds.   Msk:  no joint tenderness and no joint swelling.   Extremities:  no edema, no erythema  Neurologic:  cranial nerves II-XII intact and strength normal in all extremities.   Psych:  not anxious appearing and not depressed appearing.     Impression & Recommendations:  Problem # 1:  Preventive Health Care (ICD-V70.0) Overall doing well, age appropriate education and counseling updated and referral for appropriate preventive services done unless declined, immunizations up to date or declined, diet counseling done if overweight, urged to quit smoking if smokes , most recent labs reviewed and current ordered if appropriate, ecg reviewed or declined (interpretation per ECG scanned in the EMR if done); information regarding Medicare Prevention requirements given if appropriate; speciality referrals updated as appropriate   Complete Medication List: 1)  Cialis 20 Mg Tabs (Tadalafil) .Marland Kitchen.. 1 by mouth once daily as needed 2)  Flecainide Acetate 100 Mg Tabs (Flecainide acetate) .... Take 1 1/2  tablet twice a day 3)  Losartan Potassium 100 Mg Tabs (Losartan potassium) .Marland Kitchen.. 1po once daily 4)  Lipitor 20 Mg Tabs (Atorvastatin calcium) .Marland Kitchen.. 1 by mouth once daily 5)  Coumadin 5 Mg Tabs (Warfarin sodium) .... Take as directed by coumadin clinic. 6)  Aspirin 81 Mg Tbec (Aspirin) .Marland Kitchen.. 1 by mouth once daily 7)  Amlodipine Besylate 2.5 Mg Tabs (Amlodipine besylate) .... Take one tablet by mouth  daily 8)  Cardura 1 Mg Tabs (Doxazosin mesylate) .... Every evening  Patient Instructions: 1)  You are up to date with prevention 2)  Continue all previous medications as before this visit  3)  Please schedule a follow-up appointment in 1 year or sooner if needed Prescriptions: CIALIS 20 MG TABS (TADALAFIL) 1 by mouth once daily as needed  #5 x 11   Entered and Authorized by:   Corwin Levins MD   Signed by:   Corwin Levins MD on 01/24/2010   Method used:   Electronically to        [facility] [phone]* (retail)       [address]       [place], [place]  [address]       Ph: [phone]       Fax: [phone]   RxID:   [id number] CIALIS 20 MG TABS (TADALAFIL) 1 by mouth once daily as needed  #3 x 0   Entered and Authorized by:   Corwin Levins MD   Signed by:   Len Blalock  John MD on 01/24/2010   Method used:   Print then Give to Patient   RxID:   [id number]

## 2010-07-03 NOTE — Medication Information (Signed)
Summary: rov/tm   Anticoagulant Therapy  Managed by: Weston Brass, PharmD Referring MD: Arvilla Meres PCP: Corwin Levins MD Supervising MD: [name] MD, Sharlot Gowda Indication 1: Atrial Fibrillation (ICD-427.31) Lab Used: [facility] Site: [facility] INR POC 2.2 INR RANGE 2 - 3  Dietary changes: no    Health status changes: no    Bleeding/hemorrhagic complications: no    Recent/future hospitalizations: no    Any changes in medication regimen? no    Recent/future dental: no  Any missed doses?: no       Is patient compliant with meds? yes       Allergies: 1)  ! Ibuprofen  Anticoagulation Management History:      The patient is taking warfarin and comes in today for a routine follow up visit.  Negative risk factors for bleeding include an age less than 48 years old and no history of CVA/TIA.  The bleeding index is 'low risk'.  Positive CHADS2 values include History of HTN.  Negative CHADS2 values include Age > 80 years old, History of Diabetes, and Prior Stroke/CVA/TIA.  The start date was 08/10/2004.  Anticoagulation responsible provider: [name] MD, Sharlot Gowda.  INR POC: 2.2.  Cuvette Lot#: [id number].  Exp: 03/2011.    Anticoagulation Management Assessment/Plan:      The patient's current anticoagulation dose is Coumadin 5 mg tabs: Take as directed by coumadin clinic..  The target INR is 2.0-3.0.  The next INR is due 06/06/2010.  Anticoagulation instructions were given to patient.  Results were reviewed/authorized by Weston Brass, PharmD.  He was notified by Weston Brass PharmD.         Prior Anticoagulation Instructions: INR 1.9 Today take 10mg s then resume 7.5mg s daily except 5mg s on Mondays and Fridays. Recheck in 4 weeks.   Current Anticoagulation Instructions: INR 2.2  Continue same dose of 1 1/2 tablets every day except 1 tablet on Monday and Friday.  Recheck INR in 4 weeks.

## 2010-07-03 NOTE — Medication Information (Signed)
Summary: [name] [name]  Anticoagulant Therapy  Managed by: Bethena Midget, RN, BSN Referring MD: Arvilla Meres PCP: Corwin Levins MD Supervising MD: Clifton James MD, Cristal Deer Indication 1: Atrial Fibrillation (ICD-427.31) Lab Used: [facility] Site: [facility] INR POC 1.8 INR RANGE 2 - 3  Dietary changes: yes       Details: Ate more green leafy vegetables   Health status changes: no    Bleeding/hemorrhagic complications: no    Recent/future hospitalizations: no    Any changes in medication regimen? no    Recent/future dental: no  Any missed doses?: no       Is patient compliant with meds? yes       Allergies: 1)  ! Ibuprofen  Anticoagulation Management History:      The patient is taking warfarin and comes in today for a routine follow up visit.  Negative risk factors for bleeding include an age less than 25 years old.  The bleeding index is 'low risk'.  Positive CHADS2 values include History of HTN.  Negative CHADS2 values include Age > 44 years old.  The start date was 08/10/2004.  Anticoagulation responsible provider: Clifton James MD, Cristal Deer.  INR POC: 1.8.  Cuvette Lot#: [id number].  Exp: 07/2010.    Anticoagulation Management Assessment/Plan:      The patient's current anticoagulation dose is Coumadin 5 mg tabs: Take as directed by coumadin clinic..  The target INR is 2.0-3.0.  The next INR is due 07/06/2009.  Anticoagulation instructions were given to patient.  Results were reviewed/authorized by Bethena Midget, RN, BSN.  He was notified by Bethena Midget, RN, BSN.         Prior Anticoagulation Instructions: INR 2.1  Continue on same dosage 1.5 tablets daily except 1 tablet on Mondays, Wednesdays and Fridays.  Recheck in 4 weeks.    Current Anticoagulation Instructions: INR 1.8 Today take 7.5mg s then resume 7.5mg s everyday except 5mg s on Mondays, Wednesdays, and Fridays. Recheck in 3-4weeks.

## 2010-07-03 NOTE — Progress Notes (Signed)
Summary: referral  Phone Note Call from Patient Call back at Work Phone [phone]   Caller: Patient Summary of Call: pt called requesting referral to ENT. pt says that he has LT side ear pain after using Q-Tip. Initial call taken by: Margaret Pyle, CMA,  August 15, 2009 2:16 PM  Follow-up for Phone Call        if no blood or hearing loss, please make appt here for evaluation Follow-up by: Corwin Levins MD,  August 15, 2009 7:48 PM  Additional Follow-up for Phone Call Additional follow up Details #1::        pt sttaed thathe went to an ENT MD yesterday because the pain was intense. pt will sch f/u with PCP as needed Additional Follow-up by: Margaret Pyle, CMA,  August 16, 2009 8:55 AM

## 2010-07-03 NOTE — Progress Notes (Signed)
Summary: Pt request call   Phone Note Call from Patient Call back at Work Phone [phone]   Summary of Call: pt request call about appt for coumadin Initial call taken by: Judie Grieve,  December 20, 2009 9:27 AM  Follow-up for Phone Call        Spoke with pt and appt schedule for today.  Follow-up by: Bethena Midget, RN, BSN,  December 20, 2009 10:57 AM

## 2010-07-03 NOTE — Medication Information (Signed)
Summary: rov/tm   Anticoagulant Therapy  Managed by: Weston Brass, PharmD Referring MD: Arvilla Meres PCP: Corwin Levins MD Supervising MD: Shirlee Latch MD, Dalton Indication 1: Atrial Fibrillation (ICD-427.31) Lab Used[facility]  Site: [facility] INR POC 3.1 INR RANGE 2 - 3  Dietary changes: yes       Details: Less greens than usual  Health status changes: no    Bleeding/hemorrhagic complications: no    Recent/future hospitalizations: no    Any changes in medication regimen? no    Recent/future dental: no  Any missed doses?: yes     Details: Possibly missed 1/2 tablet one day 3 weeks ago  Is patient compliant with meds? yes       Allergies: 1)  ! Ibuprofen  Anticoagulation Management History:      The patient is taking warfarin and comes in today for a routine follow up visit.  Negative risk factors for bleeding include an age less than 45 years old and no history of CVA/TIA.  The bleeding index is 'low risk'.  Positive CHADS2 values include History of HTN.  Negative CHADS2 values include Age > 31 years old, History of Diabetes, and Prior Stroke/CVA/TIA.  The start date was 08/10/2004.  Anticoagulation responsible [name] [name]: Shirlee Latch MD, Dalton.  INR POC: 3.1.  Cuvette Lot#: [id number].  Exp: 04/2011.    Anticoagulation Management Assessment/Plan:      The patient's current anticoagulation dose is Coumadin 5 mg tabs: Take as directed by coumadin clinic..  The target INR is 2.0-3.0.  The next INR is due 03/13/2010.  Anticoagulation instructions were given to patient.  Results were reviewed/authorized by Weston Brass, PharmD.  He was notified by Harrel Carina, PharmD candidate.         Prior Anticoagulation Instructions: INR 2.3 Continue 7.5mg s daily except 5mg s on Mondays and Fridays. Recheck in 4 weeks.   Current Anticoagulation Instructions: INR 3.1  Take only 1 tablet today. Then, continue taking 1 1/2 tablets everyday except take 1 tablet on Mondays and Fridays. Re-check  INR in 4 weeks.

## 2010-07-03 NOTE — Medication Information (Signed)
Summary: rov/tm  Anticoagulant Therapy  Managed by: Bethena Midget, RN, BSN Referring MD: Arvilla Meres PCP: Corwin Levins MD Supervising MD: Gala Romney MD, Reuel Boom Indication 1: Atrial Fibrillation (ICD-427.31) Lab Used: [facility] Site: [facility] INR POC 1.9 INR RANGE 2 - 3  Dietary changes: yes       Details: Over the past weekend had extra green leafy veggies.   Health status changes: no    Bleeding/hemorrhagic complications: no    Recent/future hospitalizations: no    Any changes in medication regimen? no    Recent/future dental: no  Any missed doses?: no       Is patient compliant with meds? yes       Allergies: 1)  ! Ibuprofen  Anticoagulation Management History:      The patient is taking warfarin and comes in today for a routine follow up visit.  Negative risk factors for bleeding include an age less than 59 years old and no history of CVA/TIA.  The bleeding index is 'low risk'.  Positive CHADS2 values include History of HTN.  Negative CHADS2 values include Age > 80 years old, History of Diabetes, and Prior Stroke/CVA/TIA.  The start date was 08/10/2004.  Anticoagulation responsible provider: Breeanne Oblinger MD, Reuel Boom.  INR POC: 1.9.  Cuvette Lot#: [id number].  Exp: 10/2010.    Anticoagulation Management Assessment/Plan:      The patient's current anticoagulation dose is Coumadin 5 mg tabs: Take as directed by coumadin clinic..  The target INR is 2.0-3.0.  The next INR is due 09/20/2009.  Anticoagulation instructions were given to patient.  Results were reviewed/authorized by Bethena Midget, RN, BSN.  He was notified by Bethena Midget, RN, BSN.         Prior Anticoagulation Instructions: INR 2.4 Continue 7.5mg s daily except 5mg s on Mondays and Fridays. Recheck in 4 weeks.   Current Anticoagulation Instructions: INR 1.9 Today take 10mg s then resume 7.5mg s daily except 5mg s on Mondays and Fridays. Recheck in 4 weeks.

## 2010-07-03 NOTE — Medication Information (Signed)
Summary: rov/sp  Anticoagulant Therapy  Managed by: Bethena Midget, RN, BSN Referring MD: Arvilla Meres PCP: Corwin Levins MD Supervising MD: Gala Romney MD, Reuel Boom Indication 1: Atrial Fibrillation (ICD-427.31) Lab Used: [facility] Site: [facility] INR POC 2.3 INR RANGE 2 - 3  Dietary changes: no    Health status changes: no    Bleeding/hemorrhagic complications: no    Recent/future hospitalizations: no    Any changes in medication regimen? yes       Details: Added today new med Cardura 1mg  daily  Recent/future dental: no  Any missed doses?: no       Is patient compliant with meds? yes      Comments: Saw Dr Gala Romney today.   Allergies: 1)  ! Ibuprofen  Anticoagulation Management History:      The patient is taking warfarin and comes in today for a routine follow up visit.  Negative risk factors for bleeding include an age less than 20 years old and no history of CVA/TIA.  The bleeding index is 'low risk'.  Positive CHADS2 values include History of HTN.  Negative CHADS2 values include Age > 75 years old, History of Diabetes, and Prior Stroke/CVA/TIA.  The start date was 08/10/2004.  Anticoagulation responsible provider: Oran Dillenburg MD, Reuel Boom.  INR POC: 2.3.  Cuvette Lot#: [id number].  Exp: 03/2011.    Anticoagulation Management Assessment/Plan:      The patient's current anticoagulation dose is Coumadin 5 mg tabs: Take as directed by coumadin clinic..  The target INR is 2.0-3.0.  The next INR is due 02/13/2010.  Anticoagulation instructions were given to patient.  Results were reviewed/authorized by Bethena Midget, RN, BSN.  He was notified by Bethena Midget, RN, BSN.         Prior Anticoagulation Instructions: INR 2.6  Continue same dose of 1 1/2 tablets every day except 1 tablet on Monday and Friday.   Current Anticoagulation Instructions: INR 2.3 Continue 7.5mg s daily except 5mg s on Mondays and Fridays. Recheck in 4 weeks.

## 2010-07-03 NOTE — Assessment & Plan Note (Signed)
Summary: [name] [name] per pt call/lg  Medications Added CARDURA 1 MG TABS (DOXAZOSIN MESYLATE) every evening        Visit Type:  Follow-up Primary Provider:  Corwin Levins MD   History of Present Illness: [name] [name] is a delightful 50 year old male with history of paroxysmal atrial fibrillation complicated by sick sinus syndrome. He is maintaining sinus rhythm on Flecainide. He also has hypertension and hyperlipidemia.  He returns today for routine followup.   He is doing well. Feels good. Taking BP regularly usually 130/80s and occasionally 140s/90-100. He has just very occasional minimal palpitations, these just last for a few seconds, nothing prolonged.  He has not had any bleeding on his Coumadin.  No syncope or presyncope. Having nocturia 2-3x/night   Current Medications (verified): 1)  Cialis 20 Mg Tabs (Tadalafil) .Marland Kitchen.. 1 By Mouth Once Daily As Needed 2)  Flecainide Acetate 100 Mg Tabs (Flecainide Acetate) .... Take 1 1/2  Tablet Twice A Day 3)  Losartan Potassium 100 Mg Tabs (Losartan Potassium) .Marland Kitchen.. 1po Once Daily 4)  Lipitor 20 Mg Tabs (Atorvastatin Calcium) .Marland Kitchen.. 1 By Mouth Once Daily 5)  Coumadin 5 Mg Tabs (Warfarin Sodium) .... Take As Directed By Coumadin Clinic. 6)  Aspirin 81 Mg Tbec (Aspirin) .Marland Kitchen.. 1 By Mouth Once Daily 7)  Amlodipine Besylate 2.5 Mg Tabs (Amlodipine Besylate) .... Take One Tablet By Mouth Daily  Allergies: 1)  ! Ibuprofen  Past History:  Past Medical History: Last updated: 05/25/2009 1.Atrial fibrillation - complicated by sick sinus syndrome 2. Hyperlipidemia 3. Hypertension 4. Anticoagulation therapy 5. Erectil dysfunction 6. cervical disc disease 7. glucose intolerance 8. 1st degree AV block 9. H/o CP     -normal ETT 2006  Review of Systems       As per HPI and past medical history; otherwise all systems negative.   Vital Signs:  Patient profile:   50 year old male Height:      72.25 inches Weight:      200 pounds BMI:     27.03 Pulse rate:    47 / minute BP sitting:   160 / 104  (right arm)  Vitals Entered By: Laurance Flatten CMA (January 16, 2010 8:37 AM)   Physical Exam  General:  Muscular. no acute distress. HEENT: normal Neck: supple. no JVD. Carotids 2+ bilat; no bruits. No lymphadenopathy or thryomegaly appreciated. Cor: PMI nondisplaced. Regular. Bradycardic. No rubs, gallops, murmur. Lungs: clear Abdomen: soft, nontender, nondistended. No hepatosplenomegaly. No bruits or masses. Good bowel sounds. Extremities: no cyanosis, clubbing, rash, edema Neuro: alert & orientedx3, cranial nerves grossly intact. moves all 4 extremities w/o difficulty. affect pleasant    Impression & Recommendations:  Problem # 1:  ATRIAL FIBRILLATION (ICD-427.31) Maintaining SR on flecainide. continue coumadin.  Orders: EKG w/ Interpretation (93000)  Problem # 2:  HYPERTENSION (ICD-401.9) BP moderately elevated. Given BPH symptoms will statrt Cardura 1mg  once daily.  Problem # 3:  ERECTILE DYSFUNCTION, ORGANIC (ICD-607.84) Continue as needed Cialis. Check testerone level at his request.   Patient Instructions: 1)  Your physician recommends that you schedule a follow-up appointment in: 9 months with DrBensimhon 2)  Your physician has recommended you make the following change in your medication: start Cardura 1mg  every night Prescriptions: CARDURA 1 MG TABS (DOXAZOSIN MESYLATE) every evening  #30 x 11   Entered by:   Charolotte Capuchin, RN   Authorized by:   Dolores Patty, MD, [facility]   Signed by:   Charolotte Capuchin, RN on 01/16/2010  Method used:   Electronically to        [facility] (ret[facility])       [address]       [place], [place]  [address]       Ph: [phone]       Fax: [phone]   RxID:   [id number]

## 2010-07-03 NOTE — Medication Information (Signed)
Summary: rov/tm  Anticoagulant Therapy  Managed by: Weston Brass, PharmD Referring MD: Arvilla Meres PCP: Corwin Levins MD Supervising MD: Shirlee Latch MD, Goldman Birchall Indication 1: Atrial Fibrillation (ICD-427.31) Lab Used: [facility] Site: [facility] INR POC 2.6 INR RANGE 2 - 3  Dietary changes: no    Health status changes: no    Bleeding/hemorrhagic complications: no    Recent/future hospitalizations: no    Any changes in medication regimen? no    Recent/future dental: no  Any missed doses?: yes     Details: missed 1 dose on Friday  Is patient compliant with meds? yes       Allergies: 1)  ! Ibuprofen  Anticoagulation Management History:      The patient is taking warfarin and comes in today for a routine follow up visit.  Negative risk factors for bleeding include an age less than 31 years old and no history of CVA/TIA.  The bleeding index is 'low risk'.  Positive CHADS2 values include History of HTN.  Negative CHADS2 values include Age > 31 years old, History of Diabetes, and Prior Stroke/CVA/TIA.  The start date was 08/10/2004.  Anticoagulation responsible provider: Shirlee Latch MD, Shanetha Bradham.  INR POC: 2.6.  Cuvette Lot#: [id number].  Exp: 03/2011.    Anticoagulation Management Assessment/Plan:      The patient's current anticoagulation dose is Coumadin 5 mg tabs: Take as directed by coumadin clinic..  The target INR is 2.0-3.0.  The next INR is due 01/16/2010.  Anticoagulation instructions were given to patient.  Results were reviewed/authorized by Weston Brass, PharmD.  He was notified by Weston Brass PharmD.         Prior Anticoagulation Instructions: INR 2.1  Continue same dose of 1 1/2 tablet every day except 1 tablet on Monday and Friday   Current Anticoagulation Instructions: INR 2.6  Continue same dose of 1 1/2 tablets every day except 1 tablet on Monday and Friday.

## 2010-07-05 NOTE — Medication Information (Signed)
Summary: [name] [name]   Anticoagulant Therapy  Managed by: Louann Sjogren, PharmD Referring MD: Arvilla Meres PCP: Corwin Levins MD Supervising MD: Patty Sermons MD Indication 1: Atrial Fibrillation (ICD-427.31) Lab Used[facility]  Site: [facility] INR POC 2.4 INR RANGE 2 - 3  Dietary changes: no    Health status changes: no    Bleeding/hemorrhagic complications: no    Recent/future hospitalizations: no    Any changes in medication regimen? no    Recent/future dental: no  Any missed doses?: no       Is patient compliant with meds? yes       Allergies: 1)  ! Ibuprofen  Anticoagulation Management History:      The patient is taking warfarin and comes in today for a routine follow up visit.  Negative risk factors for bleeding include an age less than 51 years old and no history of CVA/TIA.  The bleeding index is 'low risk'.  Positive CHADS2 values include History of HTN.  Negative CHADS2 values include Age > 51 years old, History of Diabetes, and Prior Stroke/CVA/TIA.  The start date was 08/10/2004.  Today's INR is 2.4.  Anticoagulation responsible provider: Amarri Michaelson MD.  INR POC: 2.4.  Cuvette Lot#: [id number].  Exp: 07/2011.    Anticoagulation Management Assessment/Plan:      The patient's current anticoagulation dose is Coumadin 5 mg tabs: Take as directed by coumadin clinic..  The target INR is 2.0-3.0.  The next INR is due 07/25/2010.  Anticoagulation instructions were given to patient.  Results were reviewed/authorized by Louann Sjogren, PharmD.         Prior Anticoagulation Instructions: INR 3.1  Take 1 tablet today then resume same dose of 1 1/2 tablets every day except 1 tablet on Monday and Friday.  Recheck INR in 4 weeks.   Current Anticoagulation Instructions: INR 2.4 (goal 2-3)  Continue taking 1 1/2 tablets everyday except take 1 tablet on Mondays and Fridays. Recheck INR in 4 weeks.

## 2010-07-05 NOTE — Medication Information (Signed)
Summary: rov/sp   Anticoagulant Therapy  Managed by: Weston Brass, PharmD Referring MD: Arvilla Meres PCP: Corwin Levins MD Supervising MD: Graciela Husbands MD, Viviann Spare Indication 1: Atrial Fibrillation (ICD-427.31) Lab Used: [facility] Site: [facility] INR POC 3.1 INR RANGE 2 - 3  Dietary changes: no    Health status changes: no    Bleeding/hemorrhagic complications: no    Recent/future hospitalizations: no    Any changes in medication regimen? no    Recent/future dental: no  Any missed doses?: no       Is patient compliant with meds? yes       Allergies: 1)  ! Ibuprofen  Anticoagulation Management History:      The patient is taking warfarin and comes in today for a routine follow up visit.  Negative risk factors for bleeding include an age less than 82 years old and no history of CVA/TIA.  The bleeding index is 'low risk'.  Positive CHADS2 values include History of HTN.  Negative CHADS2 values include Age > 62 years old, History of Diabetes, and Prior Stroke/CVA/TIA.  The start date was 08/10/2004.  Anticoagulation responsible Demaurion Dicioccio: Graciela Husbands MD, Viviann Spare.  INR POC: 3.1.  Cuvette Lot#: [id number].  Exp: 07/2011.    Anticoagulation Management Assessment/Plan:      The patient's current anticoagulation dose is Coumadin 5 mg tabs: Take as directed by coumadin clinic..  The target INR is 2.0-3.0.  The next INR is due 06/26/2010.  Anticoagulation instructions were given to patient.  Results were reviewed/authorized by Weston Brass, PharmD.  He was notified by Weston Brass PharmD.         Prior Anticoagulation Instructions: INR 2.2  Continue same dose of 1 1/2 tablets every day except 1 tablet on Monday and Friday.  Recheck INR in 4 weeks.   Current Anticoagulation Instructions: INR 3.1  Take 1 tablet today then resume same dose of 1 1/2 tablets every day except 1 tablet on Monday and Friday.  Recheck INR in 4 weeks.

## 2010-07-17 DIAGNOSIS — I4891 Unspecified atrial fibrillation: Secondary | ICD-10-CM

## 2010-10-04 ENCOUNTER — Encounter: Payer: PRIVATE HEALTH INSURANCE | Admitting: *Deleted

## 2010-10-05 ENCOUNTER — Ambulatory Visit (INDEPENDENT_AMBULATORY_CARE_PROVIDER_SITE_OTHER): Payer: PRIVATE HEALTH INSURANCE | Admitting: *Deleted

## 2010-10-05 ENCOUNTER — Encounter: Payer: PRIVATE HEALTH INSURANCE | Admitting: *Deleted

## 2010-10-05 DIAGNOSIS — I4891 Unspecified atrial fibrillation: Secondary | ICD-10-CM

## 2010-10-05 LAB — POCT INR: INR: 2.5

## 2010-10-16 NOTE — Op Note (Signed)
NAMEBENN, TARVER              ACCOUNT NO.:  [id number]   MEDICAL RECORD NO.:  [id number]          PATIENT TYPE:  AMB   LOCATION:  [facility]                         FACILITY:  [facility]   PHYSICIAN:  Anselm Pancoast. Weatherly, M.D.DATE OF BIRTH:  01/20/1961   DATE OF PROCEDURE:  10/21/2006  DATE OF DISCHARGE:                               OPERATIVE REPORT   PREOPERATIVE DIAGNOSIS:  Right inguinal hernia, it is indirect direct  combination.   POSTOPERATIVE DIAGNOSIS:  Right inguinal hernia, it is indirect direct  combination.   OPERATION:  Right inguinal herniorrhaphy with mesh reinforcement.   Local anesthesia with sedation.   SURGEON:  Dr. Consuello Bossier.   HISTORY:  Sarim L. Staubs is a 50 year old fairly muscular male who  was seen originally in our office by Dr. Luisa Hart for a right inguinal  hernia.  He is friends with several patients that I have repaired  hernias on and he switched and I first saw him about the 1st of April  with the right inguinal hernia.  He desired that I fix his hernia and he  is here today for the scheduled procedure.  The patient has a history of  atrial fibrillation, he is on chronic Coumadin and an adult aspirin a  day.  Dr. [name] is his cardiologist and he thought it would be fine to  just stop the Coumadin for about 5 days, since he is in normal sinus  rhythm today, and then go ahead and proceed with the hernia repair and  restart the Coumadin on the day of surgery.  The patient is here for  this planned procedure.  His INR yesterday was 14 and preoperatively he  was given 1 g of Ancef.  The patient was taken to the operative suite.  The right area had been marked and initialed and then after the IV had  been started, the gram of Kefzol, the clippers were used to shave the  pubic hair in the right inguinal area.  He was then prepped with  Betadine solution and draped in a sterile manner.  I infiltrated the  skin and subcutaneous tissue where the  incision will be with about 10 mL  of a mixture of 0.5% Xylocaine and 0.25 Marcaine with adrenaline and  then the ilioinguinal nerve area was anesthetized with approximately 10  mL of the same solution at the anterior iliac crest with a blunted 22  gauge needle.  Sharp dissection down through the skin and subcutaneous  tissue.  Three veins were identified, clamped, divided and ligated with  Vicryl and the external oblique aponeurosis was opened through the  external ring.  The cord structures were elevated.  It is a fairly large  indirect hernia that you could see medial and superior to the cord  structures and the floor has been weakened because of the large hernia  sac kind of stretching everything.  I first separated the hernia sac and  a little lipoma from the cord structures and then opened the sac and  then did a high sac ligation under direct vision, taking care that the  vessels were  not incorporated in this high sac ligation.  A second  suture of #2-0 silk was placed, Surgilon was placed just distal to the  first and then the hernia sac, removed some of the lipomatas.  Pedicles  were clamped and ligated with #4-0 Vicryl.  Next, the floor was closed  in a Lichtenstein modified procedure, more like Shouldice suturing, with  a running #2-0 Prolene reforming the internal ring and then going back  suturing the two and tying the two ends together.  Next, a piece of  Prolene mesh, like a sail slit laterally, was used to reinforce the  floor of the ilioinguinal nerve that had been dissected, protected  inferiorly, was placed back with the cord structures to come out with  the cord structures.  Next, the inferior limb edge was sutured to the  inguinal ligament with a running #2-0 Prolene, the two tails sutured  together laterally and the knot distally and I protected the area.  Next, the superior flap was sutured down with interrupted sutures of #2-  0 Prolene, about 6 or 7 sutures were  used and the mesh was lying flat to  provide excessive tension.  I had put some more Marcaine in the floor  where I had repaired it and also about 2 or 3 mL where the high sac  ligation had been performed.  The external oblique was closed with a  running #2-0 Vicryl, some #3-0 Vicryl were used to close Scarpa's fascia  and then a #4-0 Vicryl subcuticular, benzoin and Steri-Strips on the  skin.  Patient tolerated procedure nicely and was taken to the recovery  room in a stable postop condition.  He will be released after a short  stay in the recovery room, hopefully will be able to void and will  resume all of his chronic medications.  I am going to use Vicodin for  postoperative pain management and I will see him back in the office in  approximately a week and he will be out of work approximately 2 weeks.           ______________________________  Anselm Pancoast. Zachery Dakins, M.D.     WJW/MEDQ  D:  10/21/2006  T:  10/21/2006  Job:  [id number]   cc:   Corwin Levins, MD  [address]  [place]  [place] [address]   Patient's Chart

## 2010-10-16 NOTE — Assessment & Plan Note (Signed)
[facility]                            CARDIOLOGY OFFICE NOTE   ARMON, ORVIS                     MRN:          [id number]  DATE:03/08/2008                            DOB:          28-Dec-1960    PRIMARY CARE PHYSICIAN:  Corwin Levins, MD   INTERVAL HISTORY:  [name] [name] is a delightful 50 year old male with history  of paroxysmal atrial fibrillation complicated by sick sinus syndrome.  He also has hypertension and hyperlipidemia.  He returns today for  routine followup.  He is doing well.  He has just very occasional  minimal palpitations, these just last for a few seconds, nothing  prolonged.  He has not had any bleeding on his Coumadin.  No syncope or  presyncope.  No chest pain or shortness of breath, though he does have  some fatigue.  He has been taking Cialis for erectile dysfunction and  states the timing of it has not been working very well for him.   CURRENT MEDICATIONS:  1. Lipitor 20 a day.  2. Aspirin 81 a day.  3. Coumadin.  4. Hyzaar 100/25.  5. Flecainide 150 b.i.d.   PHYSICAL EXAMINATION:  GENERAL:  He is well appearing in no acute  distress, ambulatory around the clinic without any respiratory  difficulty.  VITAL SIGNS:  Blood pressure is 122/82, heart rate is 54, and weight is  206.  HEENT:  Normal.  NECK:  Supple.  No JVD.  Carotids are 2+ bilaterally without bruits.  There is no lymphadenopathy or thyromegaly.  CARDIAC:  PMI is nondisplaced.  He is bradycardic and regular with an  S4, no murmurs.  LUNGS:  Clear.  ABDOMEN:  Soft, nontender, and nondistended.  No hepatosplenomegaly, no  bruits, no masses.  Good bowel sounds.  EXTREMITIES:  Warm with no cyanosis, clubbing, or edema.  No rash.  NEURO:  Alert and oriented x3.  Cranial nerves II through XII are  grossly intact.  Moves all 4 extremities without difficulty.  Affect is  pleasant.   EKG shows sinus bradycardia at a rate of 54 with first-degree AV block  and  PR interval of 210 milliseconds.   ASSESSMENT/PLAN:  1. Paroxysmal atrial fibrillation complicated by sick sinus syndrome.      He is doing well, maintaining sinus rhythm with flecainide.      Unfortunately, we cannot have him on beta-blocker at the same time      due to his bradycardia; however, there has been no evidence of      proarrhythmia.  Continue current therapy.  No indication for a      pacemaker at this point.  2. Hypertension, well controlled.  3. Hyperlipidemia, followed by Dr. Jonny Ruiz.  4. Erectile dysfunction, given him a prescription for Viagra.  We will      see if that works better for him.     Bevelyn Buckles. Bensimhon, MD  Electronically Signed    DRB/MedQ  DD: 03/08/2008  DT: 03/08/2008  Job #: [id number]

## 2010-10-16 NOTE — Assessment & Plan Note (Signed)
[facility]                            CARDIOLOGY OFFICE NOTE   EVER, GUSTAFSON                     MRN:          [id number]  DATE:09/10/2007                            DOB:          25-Apr-1961    PRIMARY CARE PHYSICIAN:  Corwin Levins, MD.   INTERVAL HISTORY:  [name] [name] is a delightful 50 year old male with history  of paroxysmal atrial fibrillation complicated by sick sinus syndrome.  Also, has hypertension, hyperlipidemia.   Returns today for routine follow-up.  We previously increased his  flecainide to 150 b.i.d. for breakthrough atrial fibrillation.  He is  doing well.  He has not any palpitations.  No syncope or presyncope.  He  denies any chest pain or shortness of breath.  He is not exercising as  much as he would like.  He has not had any problems with Coumadin.   CURRENT MEDICATIONS:  1. Lipitor 20 a day.  2. Aspirin 81.  3. Coumadin,.  4. Hyzaar 100/25.  5. Flecainide 150 b.i.d.   PHYSICAL EXAMINATION:  GENERAL:  He is well-appearing.  No acute  distress.  Ambulates around the clinic without any respiratory  difficulty.  VITAL SIGNS:  Blood pressure 108/70, heart rate is 52, weight is 205.  HEENT:  Normal.  NECK:  Supple.  No JVD.  Carotid 2+ without bilaterally bruits.  There  is no lymphadenopathy or thyromegaly.  CARDIAC:  PMI is nondisplaced.  He is bradycardic and regular.  No  murmurs.  LUNGS:  Clear.  ABDOMEN:  Soft, nontender, nondistended, no hepatosplenomegaly.  No  bruits, no masses.  Good bowel sounds.  EXTREMITIES:  Warm with no cyanosis, clubbing or edema.  No rash.  NEUROLOGICAL:  Alert and oriented x3.  Cranial nerves II-XII intact.  Moves all four extremities without difficulty.  Affect is pleasant.   ASSESSMENT/PLAN:  1. Paroxysmal atrial fibrillation/flutter, was complicated by sick      sinus syndrome.  He is maintaining sinus rhythm well on flecainide.      His bradycardia has not been symptomatic.   His intervals are      stable.  2. Hypertension, well-controlled.  3. Hyperlipidemia followed by Dr. Jonny Ruiz.   DISPOSITION:  He will follow back with [place] in 6 months for routine follow-  up.     Bevelyn Buckles. Bensimhon, MD  Electronically Signed    DRB/MedQ  DD: 09/10/2007  DT: 09/10/2007  Job #: [id number]   cc:   Corwin Levins, MD

## 2010-10-16 NOTE — Assessment & Plan Note (Signed)
[name]                            CARDIOLOGY OFFICE NOTE   [name] [name]                     MRN:          [id number]  DATE:05/27/2007                            DOB:          06/16/1960    INTERVAL HISTORY:  [name] [name] is a delightful 50 year old male with a  history of paroxysmal atrial fibrillation, as well as hypertension and  hyperlipidemia.  He returns today for routine follow up.   We have maintained him on flecainide for his atrial fibrillation and he  has done very well.  He does note that he has some breakthrough  palpitations.  He goes out of rhythm about 2-3 times a week just for a  few hours and then pops back in.  He says he felt that this morning.  He  denies any chest pain or shortness of breath with this.  There is no  change in his functional capacity.  He has not had any problems with his  Coumadin.   CURRENT MEDICATIONS:  1. Lipitor 20 a day.  2. Aspirin 81 daily.  3. Coumadin.  4. Flecainide 100 b.i.d.  5. Hyzaar 100/25 daily.   PHYSICAL EXAMINATION:  GENERAL:  He is well appearing, in no acute  distress.  Ambulates around the clinic without any respiratory  difficulty.  VITAL SIGNS:  Blood pressure is 104/76, heart rate 66.  Weight is 204.  HEENT:  Normal.  NECK:  Supple with no JVD.  Carotids are 2+ bilaterally with no bruits.  There is no lymphadenopathy or thyromegaly.  CARDIAC:  PMI is not displaced.  It is irregular irregular with no  murmurs, rubs, or gallops.  LUNGS:  Clear.  ABDOMEN:  Soft, nontender, nondistended.  No hepatosplenomegaly.  No  bruits.  No masses.  Good bowel sounds.  EXTREMITIES:  Warm with no clubbing, cyanosis, or edema.  No rash.  NEUROLOGIC:  Alert and oriented x3.  Cranial nerves II-XII are intact.  Moves all 4 extremities without difficulty.   Initial EKG shows what looks to be atypical atrial flutter with a rate  of 66.  No significant ST-T wave changes.  Shortly after this  EKG, he  said he felt better.  A repeat EKG showed sinus bradycardia at a rate of  50.  Once again, no ST-T wave abnormalities.  He does have prominent T  waves.   ASSESSMENT AND PLAN:  1. Atrial fibrillation/flutter.  Overall, he is doing fairly well with      the flecainide.  I did show his EKG to Dr. [name], who thought this      was possibly an atypical atrial flutter and may not be easily      ablatable.  He suggested checking a flecainide trough level.  If      this was at the very low end of the therapeutic level, we could      consider bumping him up a little bit.  However, his resting heart      rate is quite slow, and I am a bit reluctant to do this at the  moment.  We will see what his flecainide level comes back at, and      we can re-discuss with Dr. [name].  2. Hypertension.  Blood pressure is well controlled.  3. Hyperlipidemia.  He will be due for lipids at his next visit.     [name] [name]. Bensimhon, MD  Electronically Signed    DRB/MedQ  DD: 05/27/2007  DT: 05/27/2007  Job #: [id number]

## 2010-10-18 ENCOUNTER — Other Ambulatory Visit: Payer: Self-pay | Admitting: Internal Medicine

## 2010-10-19 NOTE — Assessment & Plan Note (Signed)
[facility]                         ELECTROPHYSIOLOGY OFFICE NOTE   TORELL, MINDER                     MRN:          [id number]  DATE:08/11/2006                            DOB:          April 22, 1961    [name] is referred today by Dr. Luisa Hart with [facility] for preoperative evaluation for his pending hernia surgery. The  patient is a very pleasant 50 year old man with a history of atrial  fibrillation which has been controlled on flecainide, a history of  Coumadin therapy, history of hypertension, dyslipidemia. He has had  problems over the last several years with worsening pain in his groin  and was found to have an inguinal hernia. He has seen Dr. Luisa Hart with  [facility] regarding surgical repair of this and is here  now for surgical preoperative assessment.   His medications include:  1. Lipitor.  2. Aspirin.  3. Coumadin.  4. Flecainide 100 twice a day.  5. Hyzaar.   His past medical history is noted in the HPI.   SOCIAL HISTORY:  Is notable that the patient denies tobacco or ethanol  use presently. He works as a Electrical engineer at [employer] He  lives in [place].   FAMILY HISTORY:  Notable for a mother dying of stroke at age 89 and a  father dying of complications of peptic ulcer disease in his 58s.   REVIEW OF SYSTEMS:  Really negative except as noted in the HPI. He has  had no syncope. He denies PND or orthopnea. He has no lower extremity  swelling and no other significant symptoms.   His physical exam is notable for a pleasant, well-appearing, middle-aged  man in no acute distress. The blood pressure was 120/90. The pulse was  80 and regular. Respirations were 18. Weight is 207 pounds.  HEENT EXAM:  Normocephalic, atraumatic. Pupils equal and round.  Oropharynx was moist. Sclerae were anicteric.  NECK:  Revealed no jugular venous distention. There was no thyromegaly.  Trachea  was midline. The carotids were 2+ symmetric.  LUNGS:  Clear bilaterally to auscultation. No wheezes, rales or rhonchi.  No increased work of breathing.  CARDIOVASCULAR EXAM:  By my examination, revealed a regular rate and  rhythm with a normal S1 and S2. There were no murmurs, rubs, or gallops.  ABDOMINAL EXAM:  Was soft, nontender, nondistended. There was no  organomegaly. Bowel sounds were present. There was no rebound or  guarding.  EXTREMITIES:  Demonstrated no clubbing, cyanosis, or edema. The pulses  were 2+ and symmetric.   The EKG demonstrates atrial fibrillation with a controlled ventricular  response.   IMPRESSION:  1. Paroxysmal atrial fibrillation.  2. Chronic hypertension.  3. Dyslipidemia.  4. Chronic Coumadin therapy.  5. Inguinal hernia for possible surgical repair.   DISCUSSION:  Overall, Mr. Gonzaga will be a low surgical risk for  cardiopulmonary complications from surgery. With regard to his  anticoagulation regimen, stopping Coumadin four or five days ahead of  time would be very reasonable. He does not need overlapping Lovenox or  heparin. On the day of surgery, he should  go ahead and proceed back with  his initial Coumadin and aspirin therapy. We will be available should  any questions arise. Also, it would be very important to start his  flecainide back on the day of surgery and use IV beta blockers as needed  if he develops atrial fibrillation with a rapid ventricular response.     [name]. [name], MD  Electronically Signed    GWT/MedQ  DD: 08/11/2006  DT: 08/12/2006  Job #: [id number]   cc:   Thomas A. Cornett, M.D.

## 2010-10-19 NOTE — Assessment & Plan Note (Signed)
[facility]                              CARDIOLOGY OFFICE NOTE   BURLE, KWAN                     MRN:          [id number]  DATE:12/31/2005                            DOB:          February 10, 1961    PRIMARY CARE PHYSICIAN:  Dr. Oliver Barre   PATIENT IDENTIFICATION:  [name] [name] is a delightful 50 year old male who  returns for routine follow up.   PAST MEDICAL HISTORY:  1.  Sick sinus syndrome with atrial fibrillation.      1.  Currently on flecainide. Anticoagulated with Coumadin.      2.  Normal LV function by echocardiogram in 2006.  2.  Hypertension, well controlled.  3.  Hyperlipidemia.  4.  History of atypical chest pain.      1.  History of a stress-test several years ago which was negative.  5.  Erectile dysfunction.  6.  Cervical spine disease.   CURRENT MEDICATIONS:  1.  Lipitor 20 daily.  2.  Aspirin 81.  3.  Coumadin.  4.  Flecainide 100 b.i.d.  5.  Hyzaar 100/25.  6.  Cialis p.r.n.   PAST MEDICAL HISTORY:  1.  [name] [name] returns today for routine follow up. Overall he is doing      quite well. He is exercise intermittently without much difficulty though      at the end of 20-30 minutes he does feel quite fatigued. He does not      that over the past couple of weeks he has had several episodes of      profound fatigue. His doctor took him off the pindolol and he says he      feels like this is much better. Denies any syncope or presyncope. Denies      any palpitations.   PHYSICAL EXAMINATION:  GENERAL:  He is well-appearing, in no acute distress.  Ambulates in the clinic without any respiratory difficulty.  VITAL SIGNS:  Blood pressure is 118/72, heart rate is 57.  HEENT:  Sclerae are anicteric. EOMI. No xanthelasma. Mucous membranes are  moist.  NECK:  Supple, no JVD. No carotid bruits. No lymphadenopathy or thyromegaly.  CARDIAC:  Bradycardic and regular, soft S4, no murmur.  LUNGS:  Clear.  ABDOMEN:  Soft,  nontender, nondistended, no hepatosplenomegaly, no bruits,  no masses. Good bowel sounds.  EXTREMITIES:  Warm with no cyanosis, clubbing or edema.  NEURO:  He is alert and oriented x3. Cranial nerves are intact. Moves all  four extremities without difficulty.   EKG shows sinus bradycardia at a rate of 57.   ASSESSMENT AND PLAN:  Problem #1. Atrial fibrillation with sick sinus  syndrome. He is currently in sinus rhythm on flecainide. He does have  significant sick sinus syndrome with a maximum heart rate on his stress-test  of only about 94 beats per minute.  I suspect he will need a pacemaker at  some time in the future. However currently he is doing well. Should he have  more episodes of fatigue we will place a monitor on him to evaluate for  worsening bradycardia.  Problem #2. Hypertension. This is well controlled, continue current  medications.   Problem #3. Hyperlipidemia, currently on Lipitor which is followed by Dr.  Jonny Ruiz.   DISPOSITION:  Return to clinic in 9 months for routine follow up.                                [name] Buckles. Bensimhon, MD    DRB/MedQ  DD:  12/31/2005  DT:  12/31/2005  Job #:  [id number]   cc:   Corwin Levins, MD

## 2010-10-19 NOTE — Assessment & Plan Note (Signed)
[facility]                         ELECTROPHYSIOLOGY OFFICE NOTE   [name] [name], [name] [name]                     MRN:          [id number]  DATE:08/11/2006                            DOB:          [birth year]    Dictation ended at this point.     [name]. [name], MD     GWT/MedQ  DD: 08/11/2006  DT: 08/12/2006  Job #: [id number]

## 2010-10-19 NOTE — Cardiovascular Report (Signed)
NAMEJIMIE, [name] [name]              ACCOUNT NO.:  [id number]   MEDICAL RECORD NO.:  [id number]          PATIENT TYPE:  OIB   LOCATION:  2899                         FACILITY:  [facility]   PHYSICIAN:  Arvilla Meres, M.D. LHCDATE OF BIRTH:  Jun 20, 1960   DATE OF PROCEDURE:  11/15/2004  DATE OF DISCHARGE:                              CARDIAC CATHETERIZATION   PROCEDURE:  Direct current cardioversion.   PATIENT IDENTIFICATION:  [name] is a very pleasant 50 year old male  with a history of hypertension and an approximate three-year history of  atrial fibrillation whom I have been following in clinic.  He was referred  today for direct current cardioversion in an attempt to restore normal sinus  rhythm.  He has been managed with Coumadin with several months of adequate  INRs including most recently being 2.7 in the office on Tuesday.   DESCRIPTION OF PROCEDURE:  The risks and benefits of cardioversion were  explained to [name].  Consent was signed and placed on the chart.  Sedation was performed by anesthesiology with propofol 150 mg IV.  After  adequate sedation, the patient was given 150 joule synchronized biphasic  shock with prompt reversion to sinus bradycardia in the low 50s.  There were  no apparent complications.   [name] will be maintained on his current therapy including Coumadin.  He may  need to wean his Toprol if he remains bradycardic.       DB/MEDQ  D:  11/15/2004  T:  11/15/2004  Job:  [id number]   cc:   Corwin Levins, M.D. [facility]

## 2010-10-19 NOTE — Assessment & Plan Note (Signed)
[name]                           ELECTROPHYSIOLOGY OFFICE NOTE   [name] [name]                     MRN:          [id number]  DATE:03/28/2006                            DOB:          05-Sep-1960    [name] [name] is referred today by Dr. Nicholes [name] for consideration for  possible pacemaker insertion.   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  He was diagnosed with atrial fibrillation  over a year ago and initially was cardioverted but returned to atrial  fibrillation and has subsequently had to be on flecainide for control of his  atrial fibrillation.  The patient does have sinus node dysfunction and sinus  bradycardia which is for the most part asymptomatic.  Prior to several  months ago he had been on a beta-blocker in conjunction with his flecainide  but developed symptomatic bradycardia and this had to be discontinued.  Whether the beta-blocker resulted in bradycardia causing the symptoms or  whether it was related to the intrinsic nature of being on the beta-blocker  in someone who is 50 years old is unclear, but for whatever reason he was  not tolerating this medication.  The patient has never had syncope.  He  continues to be quite active though he does note that with exertion he gives  out a little earlier and easier than he had in the past.  The patient did  undergo exercise stress testing where his maximum heart rate got up only to  approximately 90 beats per minute; however, when he goes into atrial  fibrillation he has had a very rapid ventricular response in the past.   ADDITIONAL PAST MEDICAL HISTORY:  Notable for:  1. Hypertension.  2. Dyslipidemia.  3. Cervical spine disease.  4. Erectile dysfunction.   MEDICATIONS:  1. Cozaar.  2. Coumadin.  3. Flecainide  100 mg twice a day.  4. He is also on Lipitor.  5. He takes p.r.n. Cialis.  6. He is on  Hyzaar 100/25 daily.   SOCIAL HISTORY:  The patient is married and has 2 kids.  He lives in  [place].  He works as a Electrical engineer at [employer]  He  rarely uses alcohol or tobacco products.   FAMILY HISTORY:  Notable for mother dying age 79 of stroke.  Father died in  his 68s of peptic ulcer disease.  He has one sister with morbid obesity and  a questionable congestive heart failure.   REVIEW OF SYSTEMS:  As noted in the HPI.  As noted above, he has no frank  syncope.  He denies PND, orthopnea, or lower extremity swelling.   ON PHYSICAL EXAMINATION:  GENERAL:  He is a pleasant, well-appearing, young  man in no acute distress.  VITAL SIGNS:  Blood pressure today 133/89.  Pulse 63 and regular.  Respirations 18.  Weight 202 pounds.  HEENT EXAM:  Normocephalic, atraumatic.  Pupils equal and round.  Oropharynx  is moist.  Sclerae anicteric.  NECK:  Revealed no jugular venous distention.  There is  no thyromegaly.  The  trachea is midline.  Carotids are 2+ and symmetric.  LUNGS:  Clear bilaterally to auscultation.  There are no wheezes, rales, or  rhonchi.  CARDIOVASCULAR EXAM:  Revealed a regular bradycardia with normal S1 and S2.  There were no murmurs, rubs, or gallops.  EXTREMITIES:  Demonstrated no cyanosis, clubbing, or edema.  Pulses were 2+  and symmetric.  ABDOMINAL EXAM:  Soft, nontender, nondistended.  There is no organomegaly.  Bowel sounds are present.  NEUROLOGIC EXAM:  Alert and oriented x3.  Cranial nerves intact.  Strength  5/5 and symmetric.   EKG demonstrates sinus bradycardia.  Prior ECGs in atrial fibrillation  demonstrate a well controlled ventricular response.   IMPRESSION:  1. Paroxysmal atrial fibrillation.  2. Sinus node dysfunction.  3. Chronic flecainide therapy.  4. Chronic Coumadin therapy.   DISCUSSION:  [name] issues are somewhat difficult.  He really does  not have much in the way of symptoms, particularly not at rest, and  he  actually maintains a fairly active lifestyle with regard to his job walking  on a regular basis.  The patient has been pretty well controlled with regard  to his atrial fibrillation on flecainide.  Unfortunately, he is unable to  take a beta-blocker secondary to symptoms on the beta-blocker.  It is  unclear whether the symptoms on the beta-blocker were related to bradycardia  alone or whether they were from the intrinsic nature of the beta-blocker  that he developed fatigue on them.  Specific issue was that to discuss the  possibility of pacemaker insertion or whether other alternative therapy,  i.e., catheter ablation of his atrial fibrillation would be a consideration.  The patient does have clear sinus node dysfunction and some evidence of  chronotropic incompetence, but the central question is, at his young age,  whether proceeding with pacemaker implantation is the best for him.  At the  present time there is no absolute indication for pacemaker implant.  I have  recommended an initial period of watchful waiting.  Secondly, it is not  clear how symptomatic he is when he goes into atrial fibrillation and  ultimately I wonder if down the road, if need be, we could stop his  flecainide.  He would certainly return to fib, but I suspect that his long-  term likelihood of atrial fibrillation is quite high.  Finally, catheter  ablation of the atrial fibrillation would be a consideration, although the  patient is not particularly symptomatic.  For now, I have  not recommended  that we proceed with an ablation procedure secondary to his really decreased  amount of symptoms of atrial fibrillation.  I will plan to see the patient  back in approximately 3-4 months.    ______________________________  [name] Canning. [name], MD    GWT/MedQ  DD: 03/28/2006  DT: 03/30/2006  Job #: [id number]   cc:   [name] Levins, MD

## 2010-10-19 NOTE — Assessment & Plan Note (Signed)
[facility]                            CARDIOLOGY OFFICE NOTE   [name] [name], [name] [name]                     MRN:          [id number]  DATE:09/05/2006                            DOB:          1961/04/02    INTERVAL HISTORY:  [name] [name] is a delightful 50 year old male with a  history of sick sinus syndrome with atrial fibrillation. He is currently  maintaining sinus rhythm on Flecainide and being anticoagulated with  Coumadin. He has normal LV function by echocardiogram. He also has a  history of hypertension and hyperlipidemia. He returns today for routine  follow up. He is doing quite well. He denies any chest pain. There is no  shortness of breath, nor palpitations. His exercise capacity remains  good. He continues to work as the Runner, broadcasting/film/video for [employer]. He is scheduled for an upcoming inguinal hernia repair for  which we have given him the okay to hold his Coumadin.   CURRENT MEDICATIONS:  1. Lipitor 20 mg daily.  2. Aspirin 81 mg daily.  3. Coumadin.  4. Flecainide 100 mg b.i.d.  5. Cozaar 100/25 mg daily.   PHYSICAL EXAMINATION:  GENERAL:  He is well appearing in no acute  distress. He ambulates around the clinic without respiratory difficulty.  VITAL SIGNS:  Blood pressure 116/82, heart rate 57.  HEENT:  Sclerae anicteric. EO MI. There are no xanthelasmas. Mucous  membranes are moist. Oral pharynx is clear.  NECK:  Supple, no JVD, no carotid bruits. There is no lymphadenopathy or  thyromegaly.  CARDIAC:  He is bradycardic and regular with an S4, no murmur.  LUNGS:  Clear.  ABDOMEN:  Soft and nontender, nondistended, no hepatosplenomegaly, no  bruits, no masses, good bowel sounds.  EXTREMITIES:  Warm with no cyanosis, clubbing, or edema.  NEUROLOGIC:  He alert and oriented x3. Cranial nerves 2-12 are intact.  He can use all 4 extremities without difficulty. Affect is very  pleasant.   EKG shows sinus bradycardia at a  rate of 57. There are no ST-T wave  abnormalities.   ASSESSMENT AND PLAN:  1. Atrial fibrillation with sick sinus syndrome. He is currently doing      well. I would continue current therapy.  2. Hypertension, well controlled.  3. Hyperlipidemia. He is on Lipitor and this is followed by [name] [name].      He is due to follow up with him later this month.   DISPOSITION:  Return to clinic in 9 months for routine follow up.     [name] Buckles. Bensimhon, MD  Electronically Signed    DRB/MedQ  DD: 09/05/2006  DT: 09/06/2006  Job #: [id number]

## 2010-11-02 ENCOUNTER — Ambulatory Visit (INDEPENDENT_AMBULATORY_CARE_PROVIDER_SITE_OTHER): Payer: PRIVATE HEALTH INSURANCE | Admitting: *Deleted

## 2010-11-02 DIAGNOSIS — I4891 Unspecified atrial fibrillation: Secondary | ICD-10-CM

## 2010-11-30 ENCOUNTER — Ambulatory Visit (INDEPENDENT_AMBULATORY_CARE_PROVIDER_SITE_OTHER): Payer: PRIVATE HEALTH INSURANCE | Admitting: *Deleted

## 2010-11-30 DIAGNOSIS — I4891 Unspecified atrial fibrillation: Secondary | ICD-10-CM

## 2010-11-30 LAB — POCT INR: INR: 2.6

## 2010-12-03 ENCOUNTER — Other Ambulatory Visit: Payer: Self-pay | Admitting: Internal Medicine

## 2010-12-03 NOTE — Telephone Encounter (Signed)
Rx Done . 

## 2010-12-24 ENCOUNTER — Other Ambulatory Visit: Payer: Self-pay | Admitting: Internal Medicine

## 2010-12-26 NOTE — Telephone Encounter (Signed)
Lm for pt to call

## 2010-12-28 ENCOUNTER — Other Ambulatory Visit: Payer: Self-pay | Admitting: *Deleted

## 2010-12-28 ENCOUNTER — Ambulatory Visit (INDEPENDENT_AMBULATORY_CARE_PROVIDER_SITE_OTHER): Payer: PRIVATE HEALTH INSURANCE | Admitting: *Deleted

## 2010-12-28 DIAGNOSIS — I4891 Unspecified atrial fibrillation: Secondary | ICD-10-CM

## 2010-12-28 MED ORDER — FLECAINIDE ACETATE 100 MG PO TABS: 150.0000 mg | ORAL_TABLET | Freq: Two times a day (BID) | ORAL | Status: DC

## 2010-12-28 NOTE — Telephone Encounter (Signed)
Pt in for cvrr and needs refill, rx sent into pharmacy, he has been out a few days but will get and start back today

## 2010-12-28 NOTE — Telephone Encounter (Signed)
Pt calling asking to speak with Jewel, as she had called pt earlier. I was unable to locate Jewel. Pt is on his way here for a coumadin appt and I told him to ask to speak with Jewel at that time. Pt said his flecainide refill request was stopped and pt was informed he needed to speak with Jewel.   Pt needs flecainide RX refill called into [facility] on [address]. Please call in refill.

## 2011-01-02 ENCOUNTER — Telehealth: Payer: Self-pay

## 2011-01-02 MED ORDER — FLECAINIDE ACETATE 100 MG PO TABS: 150.0000 mg | ORAL_TABLET | Freq: Two times a day (BID) | ORAL | Status: DC

## 2011-01-02 NOTE — Telephone Encounter (Signed)
Requested a refill for flecainide acetate 100 mg take 1 and 1/2 tablet twice a day.

## 2011-01-10 ENCOUNTER — Other Ambulatory Visit: Payer: Self-pay | Admitting: Internal Medicine

## 2011-01-11 ENCOUNTER — Other Ambulatory Visit: Payer: Self-pay

## 2011-01-11 MED ORDER — ATORVASTATIN CALCIUM 20 MG PO TABS: 20.0000 mg | ORAL_TABLET | Freq: Every day | ORAL | Status: DC

## 2011-01-23 ENCOUNTER — Telehealth: Payer: Self-pay

## 2011-01-23 ENCOUNTER — Other Ambulatory Visit: Payer: Self-pay | Admitting: Internal Medicine

## 2011-01-23 DIAGNOSIS — Z0000 Encounter for general adult medical examination without abnormal findings: Secondary | ICD-10-CM

## 2011-01-23 NOTE — Telephone Encounter (Signed)
Ordered labs for physical in September 2012.

## 2011-01-25 ENCOUNTER — Ambulatory Visit (INDEPENDENT_AMBULATORY_CARE_PROVIDER_SITE_OTHER): Payer: PRIVATE HEALTH INSURANCE | Admitting: *Deleted

## 2011-01-25 DIAGNOSIS — I4891 Unspecified atrial fibrillation: Secondary | ICD-10-CM

## 2011-01-31 ENCOUNTER — Other Ambulatory Visit: Payer: Self-pay | Admitting: Internal Medicine

## 2011-02-07 ENCOUNTER — Other Ambulatory Visit: Payer: PRIVATE HEALTH INSURANCE

## 2011-02-08 ENCOUNTER — Other Ambulatory Visit: Payer: Self-pay | Admitting: Internal Medicine

## 2011-02-08 ENCOUNTER — Other Ambulatory Visit (INDEPENDENT_AMBULATORY_CARE_PROVIDER_SITE_OTHER): Payer: PRIVATE HEALTH INSURANCE

## 2011-02-08 ENCOUNTER — Encounter: Payer: Self-pay | Admitting: Internal Medicine

## 2011-02-08 DIAGNOSIS — Z0000 Encounter for general adult medical examination without abnormal findings: Secondary | ICD-10-CM | POA: Insufficient documentation

## 2011-02-08 DIAGNOSIS — Z0001 Encounter for general adult medical examination with abnormal findings: Secondary | ICD-10-CM | POA: Insufficient documentation

## 2011-02-08 LAB — URINALYSIS, ROUTINE W REFLEX MICROSCOPIC
Hgb urine dipstick: NEGATIVE
Leukocytes, UA: NEGATIVE
Urine Glucose: NEGATIVE
Urobilinogen, UA: 0.2 (ref 0.0–1.0)

## 2011-02-08 LAB — CBC WITH DIFFERENTIAL/PLATELET
Basophils Relative: 0.5 % (ref 0.0–3.0)
Eosinophils Relative: 1.8 % (ref 0.0–5.0)
HCT: 41.8 % (ref 39.0–52.0)
Hemoglobin: 13.9 g/dL (ref 13.0–17.0)
Lymphs Abs: 1.9 10*3/uL (ref 0.7–4.0)
MCV: 89.7 fl (ref 78.0–100.0)
Monocytes Absolute: 1 10*3/uL (ref 0.1–1.0)
Monocytes Relative: 10.8 % (ref 3.0–12.0)
Neutro Abs: 6.1 10*3/uL (ref 1.4–7.7)
RBC: 4.66 Mil/uL (ref 4.22–5.81)
WBC: 9.2 10*3/uL (ref 4.5–10.5)

## 2011-02-08 LAB — LIPID PANEL
Cholesterol: 138 mg/dL (ref 0–200)
LDL Cholesterol: 66 mg/dL (ref 0–99)
Total CHOL/HDL Ratio: 3
Triglycerides: 92 mg/dL (ref 0.0–149.0)
VLDL: 18.4 mg/dL (ref 0.0–40.0)

## 2011-02-08 LAB — HEPATIC FUNCTION PANEL
Albumin: 4.3 g/dL (ref 3.5–5.2)
Alkaline Phosphatase: 48 U/L (ref 39–117)
Total Protein: 7.4 g/dL (ref 6.0–8.3)

## 2011-02-08 LAB — BASIC METABOLIC PANEL
BUN: 19 mg/dL (ref 6–23)
CO2: 23 mEq/L (ref 19–32)
Calcium: 9.2 mg/dL (ref 8.4–10.5)
Creatinine, Ser: 0.9 mg/dL (ref 0.4–1.5)
GFR: 109.95 mL/min (ref >60.00)
Glucose, Bld: 99 mg/dL (ref 70–99)
Sodium: 140 mEq/L (ref 135–145)

## 2011-02-08 LAB — TSH: TSH: 0.45 u[IU]/mL (ref 0.35–5.50)

## 2011-02-14 ENCOUNTER — Encounter: Payer: Self-pay | Admitting: Internal Medicine

## 2011-02-14 ENCOUNTER — Ambulatory Visit (INDEPENDENT_AMBULATORY_CARE_PROVIDER_SITE_OTHER): Payer: PRIVATE HEALTH INSURANCE | Admitting: Internal Medicine

## 2011-02-14 VITALS — BP 110/80 | HR 47 | Temp 98.0°F | Ht 72.0 in | Wt 202.0 lb

## 2011-02-14 DIAGNOSIS — R351 Nocturia: Secondary | ICD-10-CM | POA: Insufficient documentation

## 2011-02-14 DIAGNOSIS — Z0000 Encounter for general adult medical examination without abnormal findings: Secondary | ICD-10-CM

## 2011-02-14 DIAGNOSIS — Z23 Encounter for immunization: Secondary | ICD-10-CM

## 2011-02-14 DIAGNOSIS — N529 Male erectile dysfunction, unspecified: Secondary | ICD-10-CM | POA: Insufficient documentation

## 2011-02-14 NOTE — Assessment & Plan Note (Signed)

## 2011-02-14 NOTE — Patient Instructions (Signed)
You had the flu shot today You will be contacted regarding the referral for: urology Continue all other medications as before Please have the pharmacy call if you need refills

## 2011-02-14 NOTE — Assessment & Plan Note (Signed)
Severe, unresponsive to usual meds, for urology referral

## 2011-02-14 NOTE — Progress Notes (Signed)
Subjective:    Patient ID: [name] [name], male    DOB: [birth year], 50 y.o.   MRN: [id number]  HPI  Here for wellness and f/u;  Overall doing ok;  Pt denies CP, worsening SOB, DOE, wheezing, orthopnea, PND, worsening LE edema, palpitations, dizziness or syncope.  Pt denies neurological change such as new Headache, facial or extremity weakness.  Pt denies polydipsia, polyuria, or low sugar symptoms. Pt states overall good compliance with treatment and medications, good tolerability, and trying to follow lower cholesterol diet.  Pt denies worsening depressive symptoms, suicidal ideation or panic. No fever, wt loss, night sweats, loss of appetite, or other constitutional symptoms.  Pt states good ability with ADL's, low fall risk, home safety reviewed and adequate, no significant changes in hearing or vision, and occasionally active with exercise.  Has had some signifcant urinary urgency and freq without other such as hesitancy, slow stream, dysuria. N hx of BPH or prostatitis. ALso with signficant Ed symptioms, and really has little improvement of function on the cialis, levitra and viagra in the past. Past Medical History  Diagnosis Date  . Abdominal pain, epigastric 01/01/2008  . Atrial fibrillation 12/15/2007  . CHEST PAIN-PRECORDIAL 05/25/2009  . ERECTILE DYSFUNCTION, ORGANIC 01/16/2010  . HYPERLIPIDEMIA 12/15/2007  . HYPERTENSION 12/15/2007  . HYPOKALEMIA 12/15/2007  . POSTURAL LIGHTHEADEDNESS 07/06/2008   Past Surgical History  Procedure Date  . Inguinal herniorrhapy 6/08    reports that he has been smoking.  He does not have any smokeless tobacco history on file. He reports that he drinks alcohol. His drug history not on file. family history includes Heart disease in his sister and Stroke in his mother. Allergies  Allergen Reactions  . Ibuprofen     REACTION: rapid heart beat   Current Outpatient Prescriptions on File Prior to Visit  Medication Sig Dispense Refill  . amLODipine (NORVASC)  2.5 MG tablet TAKE ONE TABLET BY MOUTH DAILY  30 tablet  5  . atorvastatin (LIPITOR) 20 MG tablet Take 1 tablet (20 mg total) by mouth daily.  30 tablet  1  . doxazosin (CARDURA) 1 MG tablet TAKE 1 TABLET BY MOUTH EVERY DAY IN THE EVENING  30 tablet  11  . flecainide (TAMBOCOR) 100 MG tablet TAKE 1 AND 1/2 TABLET TWICE DAILY  90 tablet  4  . flecainide (TAMBOCOR) 100 MG tablet Take 1.5 tablets (150 mg total) by mouth 2 (two) times daily.  45 tablet  6  . LEVITRA 20 MG tablet TAKE ONE TABLET BY MOUTH EVERY OTHER DAY AS NEEDED  5 each  11  . losartan (COZAAR) 100 MG tablet TAKE 1 TABLET EVERY DAY  30 tablet  1  . warfarin (COUMADIN) 5 MG tablet TAKE AS DIRECTED  135 tablet  0   Review of Systems Review of Systems  Constitutional: Negative for diaphoresis, activity change, appetite change and unexpected weight change.  HENT: Negative for hearing loss, ear pain, facial swelling, mouth sores and neck stiffness.   Eyes: Negative for pain, redness and visual disturbance.  Respiratory: Negative for shortness of breath and wheezing.   Cardiovascular: Negative for chest pain and palpitations.  Gastrointestinal: Negative for diarrhea, blood in stool, abdominal distention and rectal pain.  Genitourinary: Negative for hematuria, flank pain and decreased urine volume.  Musculoskeletal: Negative for myalgias and joint swelling.  Skin: Negative for color change and wound.  Neurological: Negative for syncope and numbness.  Hematological: Negative for adenopathy.  Psychiatric/Behavioral: Negative for hallucinations, self-injury, decreased concentration and  agitation.      Objective:   Physical Exam BP 110/80  Pulse 47  Temp(Src) 98 F (36.7 C) (Oral)  Ht 6' (1.829 m)  Wt 202 lb (91.627 kg)  BMI 27.40 kg/m2  SpO2 96% Physical Exam  VS noted Constitutional: Pt is oriented to person, place, and time. Appears well-developed and well-nourished.  HENT:  Head: Normocephalic and atraumatic.  Right Ear:  External ear normal.  Left Ear: External ear normal.  Nose: Nose normal.  Mouth/Throat: Oropharynx is clear and moist.  Eyes: Conjunctivae and EOM are normal. Pupils are equal, round, and reactive to light.  Neck: Normal range of motion. Neck supple. No JVD present. No tracheal deviation present.  Cardiovascular: Normal rate, regular rhythm, normal heart sounds and intact distal pulses.   Pulmonary/Chest: Effort normal and breath sounds normal.  Abdominal: Soft. Bowel sounds are normal. There is no tenderness.  Musculoskeletal: Normal range of motion. Exhibits no edema.  Lymphadenopathy:  Has no cervical adenopathy.  Neurological: Pt is alert and oriented to person, place, and time. Pt has normal reflexes. No cranial nerve deficit.  Skin: Skin is warm and dry. No rash noted.  Psychiatric:  Has  normal mood and affect. Behavior is normal.     Assessment & Plan:

## 2011-02-22 ENCOUNTER — Encounter: Payer: PRIVATE HEALTH INSURANCE | Admitting: *Deleted

## 2011-03-22 ENCOUNTER — Other Ambulatory Visit: Payer: Self-pay | Admitting: Internal Medicine

## 2011-03-28 ENCOUNTER — Other Ambulatory Visit: Payer: Self-pay | Admitting: *Deleted

## 2011-03-28 MED ORDER — FLECAINIDE ACETATE 100 MG PO TABS: 150.0000 mg | ORAL_TABLET | Freq: Two times a day (BID) | ORAL | Status: DC

## 2011-04-03 ENCOUNTER — Other Ambulatory Visit: Payer: Self-pay | Admitting: Internal Medicine

## 2011-04-04 ENCOUNTER — Other Ambulatory Visit: Payer: Self-pay | Admitting: Internal Medicine

## 2011-04-06 ENCOUNTER — Other Ambulatory Visit: Payer: Self-pay | Admitting: Internal Medicine

## 2011-04-15 ENCOUNTER — Other Ambulatory Visit: Payer: Self-pay | Admitting: Internal Medicine

## 2011-04-26 ENCOUNTER — Other Ambulatory Visit: Payer: Self-pay | Admitting: Internal Medicine

## 2011-04-26 NOTE — Telephone Encounter (Signed)
Done per emr 

## 2011-05-10 ENCOUNTER — Ambulatory Visit (INDEPENDENT_AMBULATORY_CARE_PROVIDER_SITE_OTHER): Payer: PRIVATE HEALTH INSURANCE | Admitting: *Deleted

## 2011-05-10 DIAGNOSIS — I4891 Unspecified atrial fibrillation: Secondary | ICD-10-CM

## 2011-05-10 LAB — POCT INR: INR: 1.9

## 2011-05-27 ENCOUNTER — Ambulatory Visit (INDEPENDENT_AMBULATORY_CARE_PROVIDER_SITE_OTHER): Payer: PRIVATE HEALTH INSURANCE | Admitting: Internal Medicine

## 2011-05-27 ENCOUNTER — Encounter: Payer: Self-pay | Admitting: Internal Medicine

## 2011-05-27 VITALS — BP 126/90 | HR 66 | Ht 72.0 in | Wt 202.0 lb

## 2011-05-27 DIAGNOSIS — I4891 Unspecified atrial fibrillation: Secondary | ICD-10-CM

## 2011-05-27 DIAGNOSIS — R0683 Snoring: Secondary | ICD-10-CM | POA: Insufficient documentation

## 2011-05-27 DIAGNOSIS — R0609 Other forms of dyspnea: Secondary | ICD-10-CM

## 2011-05-27 DIAGNOSIS — R0989 Other specified symptoms and signs involving the circulatory and respiratory systems: Secondary | ICD-10-CM

## 2011-05-27 DIAGNOSIS — I10 Essential (primary) hypertension: Secondary | ICD-10-CM

## 2011-05-27 NOTE — Patient Instructions (Addendum)
Your physician recommends that you schedule a follow-up appointment in the [facility] with Dr. Gala Romney in 1 month.  Your physician has recommended that you wear a holter monitor. Holter monitors are medical devices that record the heart's electrical activity. Doctors most often use these monitors to diagnose arrhythmias. Arrhythmias are problems with the speed or rhythm of the heartbeat. The monitor is a small, portable device. You can wear one while you do your normal daily activities. This is usually used to diagnose what is causing palpitations/syncope (passing out).  Your physician has recommended that you have a sleep study. This test records several body functions during sleep, including: brain activity, eye movement, oxygen and carbon dioxide blood levels, heart rate and rhythm, breathing rate and rhythm, the flow of air through your mouth and nose, snoring, body muscle movements, and chest and belly movement.  You have been referred to a pulmonologist for a sleep study.

## 2011-05-27 NOTE — Assessment & Plan Note (Signed)
Suspect he has OSA. Will refer to pulmonary for eval.

## 2011-05-27 NOTE — Assessment & Plan Note (Signed)
SBP looks good but DBP remains slightly up. Will continue meds at current dose as I worry about creating systolic hypotension if we push much further. Suggested more exercise and keeping weight down as this seems to help with his BP the most.

## 2011-05-27 NOTE — Assessment & Plan Note (Addendum)
He is back in AF today. Hard to tell how symptomatic this is for him. Will place 48 monitor to assess for AF burden. If burden is low will continue  Flecainide as is. If burden is high will consider switching to Tikosyn versus evaluation for AF ablation. Will get sleep study as I suspect is contibuting to his symptoms and AF burden. We discussed AF ablation and he is interested in considering if it will help.

## 2011-05-27 NOTE — Progress Notes (Signed)
HPI:  [name] [name] is a delightful 50 year old male with history of paroxysmal atrial fibrillation complicated by sick sinus syndrome. He is maintaining sinus rhythm on Flecainide 150 bid. He also has hypertension and hyperlipidemia.  He returns today for routine followup.  Says he feels his heart go out of rhythm about 1x/week. Mostly at night. Episodes can last hours. Feels fatigued but not sure it is worse than before. Taking BP regularly usually 120-125/85-90.  He has not had any bleeding on his Coumadin.  No syncope or presyncope. No CP. Moderate snoring if he lays on his back. Says BP and AF are better when his weight is down.   ROS: All systems negative except as listed in HPI, PMH and Problem List.  Past Medical History  Diagnosis Date  . Abdominal pain, epigastric 01/01/2008  . Atrial fibrillation 12/15/2007  . CHEST PAIN-PRECORDIAL 05/25/2009  . ERECTILE DYSFUNCTION, ORGANIC 01/16/2010  . HYPERLIPIDEMIA 12/15/2007  . HYPERTENSION 12/15/2007  . HYPOKALEMIA 12/15/2007  . POSTURAL LIGHTHEADEDNESS 07/06/2008    Current Outpatient Prescriptions  Medication Sig Dispense Refill  . amLODipine (NORVASC) 2.5 MG tablet TAKE ONE TABLET BY MOUTH DAILY  30 tablet  5  . atorvastatin (LIPITOR) 20 MG tablet TAKE 1 TABLET BY MOUTH EVERY DAY  30 tablet  5  . CIALIS 20 MG tablet TAKE ONE TABLET BY MOUTH EVERY DAY AS NEEDED  5 each  5  . doxazosin (CARDURA) 1 MG tablet TAKE 1 TABLET BY MOUTH EVERY DAY IN THE EVENING  30 tablet  11  . flecainide (TAMBOCOR) 100 MG tablet Take 1.5 tablets (150 mg total) by mouth 2 (two) times daily.  90 tablet  3  . LEVITRA 20 MG tablet TAKE ONE TABLET BY MOUTH EVERY OTHER DAY AS NEEDED  5 each  11  . losartan (COZAAR) 100 MG tablet TAKE 1 TABLET EVERY DAY  30 tablet  11  . warfarin (COUMADIN) 5 MG tablet TAKE AS DIRECTED  135 tablet  3     PHYSICAL EXAM: Filed Vitals:   05/27/11 1021  BP: 100/90  Pulse: 66   General:  Well appearing. No resp difficulty HEENT:  normal Neck: supple. JVP flat. Carotids 2+ bilaterally; no bruits. No lymphadenopathy or thryomegaly appreciated. Cor: PMI normal. Irregular rhythm. No rubs, gallops or murmurs. Lungs: clear Abdomen: soft, nontender, nondistended. No hepatosplenomegaly. No bruits or masses. Good bowel sounds. Extremities: no cyanosis, clubbing, rash, edema Neuro: alert & orientedx3, cranial nerves grossly intact. Moves all 4 extremities w/o difficulty. Affect pleasant.    ECG: AF 66 No ST-T wave abnormalities.     ASSESSMENT & PLAN:

## 2011-06-07 ENCOUNTER — Encounter (INDEPENDENT_AMBULATORY_CARE_PROVIDER_SITE_OTHER): Payer: BC Managed Care – PPO

## 2011-06-07 ENCOUNTER — Encounter: Payer: PRIVATE HEALTH INSURANCE | Admitting: *Deleted

## 2011-06-07 ENCOUNTER — Ambulatory Visit (INDEPENDENT_AMBULATORY_CARE_PROVIDER_SITE_OTHER): Payer: BC Managed Care – PPO | Admitting: *Deleted

## 2011-06-07 DIAGNOSIS — I4891 Unspecified atrial fibrillation: Secondary | ICD-10-CM

## 2011-06-07 LAB — POCT INR: INR: 1.6

## 2011-06-12 ENCOUNTER — Telehealth: Payer: Self-pay | Admitting: Internal Medicine

## 2011-06-12 ENCOUNTER — Telehealth: Payer: Self-pay | Admitting: *Deleted

## 2011-06-12 NOTE — Telephone Encounter (Signed)
New Problem:    Representative from [employer] called in to try and get consent for [name] to have a tooth extraction procedure done while he is still on coumadin. Please call back or fax consent form to (fax: [phone]).

## 2011-06-12 NOTE — Telephone Encounter (Signed)
Okay for patient to have a tooth extraction per Marilynn Rail Pharm-D.  Pt's INR 1.6 yesterday.

## 2011-06-12 NOTE — Telephone Encounter (Signed)
FU Call: [employer] calling back to speak to someone regarding pt tooth extraction. Pt is in the chair/office. Please call asap.

## 2011-06-12 NOTE — Telephone Encounter (Signed)
OKAY FOR PATIENT TO HAVE A TOOTH EXTRACTION PER [facility] PHARM-D. Pt's INR was 1.6 yesterday.

## 2011-06-13 ENCOUNTER — Telehealth: Payer: Self-pay | Admitting: Internal Medicine

## 2011-06-13 NOTE — Telephone Encounter (Signed)
New Problem  Patient called for the second time.  Need Dr. Ardyth Gal for dentist appnt.  Patient has abscess

## 2011-06-13 NOTE — Telephone Encounter (Signed)
N/A.  LMTC. 

## 2011-06-19 ENCOUNTER — Telehealth: Payer: Self-pay

## 2011-06-19 ENCOUNTER — Ambulatory Visit (INDEPENDENT_AMBULATORY_CARE_PROVIDER_SITE_OTHER): Payer: BC Managed Care – PPO | Admitting: Pulmonary Disease

## 2011-06-19 ENCOUNTER — Encounter: Payer: Self-pay | Admitting: Pulmonary Disease

## 2011-06-19 VITALS — BP 140/88 | HR 52 | Temp 98.3°F | Ht 72.0 in | Wt 195.0 lb

## 2011-06-19 DIAGNOSIS — R0683 Snoring: Secondary | ICD-10-CM

## 2011-06-19 DIAGNOSIS — R0609 Other forms of dyspnea: Secondary | ICD-10-CM

## 2011-06-19 DIAGNOSIS — R351 Nocturia: Secondary | ICD-10-CM

## 2011-06-19 DIAGNOSIS — R0989 Other specified symptoms and signs involving the circulatory and respiratory systems: Secondary | ICD-10-CM

## 2011-06-19 NOTE — Telephone Encounter (Signed)
Patient missed urologist appointment at the end of the year. He is still having urinating problems and would like a referral please. Call back number is [phone] or [phone]

## 2011-06-19 NOTE — Assessment & Plan Note (Signed)
He does not have other symptoms of prostatism. In the context of loud snoring & other sleep symptoms, obstructive sleep apnea is probable. Given excessive daytime somnolence, narrow pharyngeal exam, witnessed apneas & loud snoring, obstructive sleep apnea is very likely & an overnight polysomnogram will be scheduled as a split study. The pathophysiology of obstructive sleep apnea , it's cardiovascular consequences & modes of treatment including CPAP were discused with the patient in detail & he evidenced understanding. Relation of atrial fibrillation & obstructive sleep apnea was discussed

## 2011-06-19 NOTE — Patient Instructions (Signed)
Schedule sleep study

## 2011-06-19 NOTE — Progress Notes (Signed)
  Subjective:    Patient ID: [name] [name], male    DOB: 1960/11/06, 51 y.o.   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 He has a history of paroxysmal atrial fibrillation complicated by sick sinus syndrome. He is maintaining sinus rhythm on Flecainide 150 bid. He also has hypertension and hyperlipidemia. Af ablation is being considered.  His wife has noted loud snoring but not witnessed apneas. His main complaint is nocturia, He has 3-4 awakenings & a prolonged post void latency. He reports a good urine stream & denies hesitancy or burning micturition.  ESS 1/24 Bedtime 10-11 30pm, latency 20 mins, 3-4 awakenings, sleeps on his side x 1-2 pillows, oob at 0630, tired, no dryness or headaches. Feel he may be under-reporting daytime fatigue & somnolence. There is no history suggestive of cataplexy, sleep paralysis or parasomnias    Review of Systems  Constitutional: Negative for fever, appetite change and unexpected weight change.  HENT: Positive for dental problem. Negative for ear pain, congestion, sore throat, rhinorrhea, sneezing, trouble swallowing and postnasal drip.   Eyes: Positive for redness and itching.  Respiratory: Positive for shortness of breath. Negative for cough and wheezing.   Cardiovascular: Positive for palpitations. Negative for chest pain and leg swelling.  Gastrointestinal: Positive for abdominal pain. Negative for nausea, vomiting and diarrhea.  Genitourinary: Negative for dysuria and urgency.  Musculoskeletal: Negative for joint swelling.  Skin: Negative for rash.  Neurological: Negative for syncope and headaches.  Hematological: Bruises/bleeds easily.  Psychiatric/Behavioral: Negative for dysphoric mood. The patient is not nervous/anxious.        Objective:   Physical Exam  Gen. Pleasant, well-nourished, in no distress, normal affect ENT - no lesions, no post nasal drip Neck: No  JVD, no thyromegaly, no carotid bruits Lungs: no use of accessory muscles, no dullness to percussion, clear without rales or rhonchi  Cardiovascular: Rhythm regular, heart sounds  normal, no murmurs or gallops, no peripheral edema Abdomen: soft and non-tender, no hepatosplenomegaly, BS normal. Musculoskeletal: No deformities, no cyanosis or clubbing Neuro:  alert, non focal       Assessment & Plan:

## 2011-06-19 NOTE — Telephone Encounter (Signed)
Done per emr 

## 2011-06-19 NOTE — Telephone Encounter (Signed)
Patient informed. 

## 2011-06-21 ENCOUNTER — Encounter: Payer: BC Managed Care – PPO | Admitting: *Deleted

## 2011-06-25 ENCOUNTER — Ambulatory Visit (INDEPENDENT_AMBULATORY_CARE_PROVIDER_SITE_OTHER): Payer: BC Managed Care – PPO

## 2011-06-25 DIAGNOSIS — I4891 Unspecified atrial fibrillation: Secondary | ICD-10-CM

## 2011-06-25 LAB — POCT INR: INR: 2.3

## 2011-07-03 ENCOUNTER — Telehealth (HOSPITAL_COMMUNITY): Payer: Self-pay | Admitting: *Deleted

## 2011-07-03 NOTE — Telephone Encounter (Signed)
Called pt with results from holter monitor which showed a-fib/flutter per Dr Gala Romney he would like for pt to see Dr Johney Frame to discuss af ablation vs. Tikosyn, pt is aware and agreeable, he will call me back to schedule appt when he can look at his calendar

## 2011-07-07 ENCOUNTER — Encounter (HOSPITAL_BASED_OUTPATIENT_CLINIC_OR_DEPARTMENT_OTHER): Payer: BC Managed Care – PPO

## 2011-07-08 ENCOUNTER — Ambulatory Visit (HOSPITAL_BASED_OUTPATIENT_CLINIC_OR_DEPARTMENT_OTHER): Payer: BC Managed Care – PPO | Attending: Pulmonary Disease | Admitting: Radiology

## 2011-07-08 VITALS — Ht 73.0 in | Wt 197.0 lb

## 2011-07-08 DIAGNOSIS — I4891 Unspecified atrial fibrillation: Secondary | ICD-10-CM | POA: Insufficient documentation

## 2011-07-08 DIAGNOSIS — G4733 Obstructive sleep apnea (adult) (pediatric): Secondary | ICD-10-CM | POA: Insufficient documentation

## 2011-07-08 DIAGNOSIS — Z79899 Other long term (current) drug therapy: Secondary | ICD-10-CM | POA: Insufficient documentation

## 2011-07-08 DIAGNOSIS — Z7982 Long term (current) use of aspirin: Secondary | ICD-10-CM | POA: Insufficient documentation

## 2011-07-08 DIAGNOSIS — R0683 Snoring: Secondary | ICD-10-CM

## 2011-07-08 DIAGNOSIS — I10 Essential (primary) hypertension: Secondary | ICD-10-CM | POA: Insufficient documentation

## 2011-07-10 NOTE — Telephone Encounter (Signed)
Pt is scheduled to see Dr Johney Frame 2/13

## 2011-07-14 DIAGNOSIS — Z7982 Long term (current) use of aspirin: Secondary | ICD-10-CM

## 2011-07-14 DIAGNOSIS — I10 Essential (primary) hypertension: Secondary | ICD-10-CM

## 2011-07-14 DIAGNOSIS — Z79899 Other long term (current) drug therapy: Secondary | ICD-10-CM

## 2011-07-14 DIAGNOSIS — I4891 Unspecified atrial fibrillation: Secondary | ICD-10-CM

## 2011-07-14 DIAGNOSIS — G4733 Obstructive sleep apnea (adult) (pediatric): Secondary | ICD-10-CM

## 2011-07-15 NOTE — Procedures (Signed)
[name] [name], [name] [name]              ACCOUNT NO.:  [id number]  MEDICAL RECORD NO.:  [id number]          PATIENT TYPE:  OUT  LOCATION:  SLEEP CENTER                 FACILITY:  [facility]  PHYSICIAN:  Oretha Milch, MD      DATE OF BIRTH:  April 19, 1961  DATE OF STUDY:  07/08/2011                           NOCTURNAL POLYSOMNOGRAM  REFERRING PHYSICIAN:  Oretha Milch, MD  INDICATION FOR STUDY:  Loud snoring, witnessed apneas, excessive daytime fatigue in this 51 year old gentleman with hypertension and atrial fibrillation. At the time of this study, he weighed 197 pounds with a height of 6 feet 1 inch, BMI of 27, neck size 17 inches.  EPWORTH SLEEPINESS SCORE:  3.  MEDICATIONS:  Bedtime medications include flecainide, losartan, warfarin, amlodipine, doxazosin, atorvastatin, and aspirin.  This nocturnal polysomnogram was performed with a sleep technologist in attendance.  EEG, EOG, EMG, EKG, and respiratory parameters were recorded.  Sleep stages arousals, limb movements, and respiratory data were scored according to criteria laid out by the American Academy of Sleep Medicine.  SLEEP ARCHITECTURE:  Lights out was at 11:00 p.m., lights on was at 5:07 a.m.  Total sleep time was 284 minutes with a sleep period time of 357 minutes.  Sleep latency was 9.5 minutes with latency to REM sleep was 67 minutes.  Sleep stages as a percentage of total sleep time was N1 10%, N2 68%, N3 0%, and REM sleep 22% (62 minutes).  Supine sleep was not noted and the longest period of REM sleep was around 3:00 am.  AROUSAL DATA:  There were total of 132 arousals with an arousal index of 28 events per hour, of these 123 were spontaneous, and the rest were associated with respiratory events.  RESPIRATORY DATA:  There were total of 6 obstructive apneas, 2 central apneas, 0 mixed apnea, and 15 hypopneas with an apnea-hypopnea index of 5 events per hour.  There were 68 respiratory effort-related arousals (RERAs) were  noted in an RDI of 19 events per hour.  Longest hypopnea was 34 seconds and longest apnea was 17 seconds.  Most of the events were noted during REM sleep with a REM-related AHI of 14 events per hour.  LIMB MOVEMENT DATA:  Very few limb movements were noted with an index of 0.8 events per hour.  OXYGEN SATURATION DATA:  The desaturation index was 3 events per hour. The lowest desaturation was 88%.  CARDIAC DATA:  The low heart rate was 35 beats per minute.  The high heart rate was 118 beats per minute.  No arrhythmias were noted other than occasional APCs.  DISCUSSION:  He did not have supine sleep, most events were noted during REM.  He did not meet sleep night criteria.  He was desensitized with a medium full-face mask.  IMPRESSION: 1. Mild obstructive sleep apnea with predominant hypopneas and     respiratory-effort related arousals causing sleep fragmentation and     mild oxygen desaturation. 2. No evidence of cardiac arrhythmias, limb movements, or behavioral     disturbance during sleep. 3. Spontaneous arousals causing sleep fragmentation.  RECOMMENDATION: 1. Treatment options for upper airway resistance syndrome includes     CPAP therapy,  oral appliance, and/or weight loss. 2. He should be asked to avoid medications sedating side effects. 3. He should be cautioned against driving when sleepy.     Oretha Milch, MD    RVA/MEDQ  D:  07/14/2011 14:48:44  T:  07/15/2011 07:12:53  Job:  [id number]

## 2011-07-17 ENCOUNTER — Encounter: Payer: Self-pay | Admitting: Internal Medicine

## 2011-07-17 ENCOUNTER — Encounter: Payer: Self-pay | Admitting: *Deleted

## 2011-07-17 ENCOUNTER — Other Ambulatory Visit (HOSPITAL_COMMUNITY): Payer: Self-pay | Admitting: Radiology

## 2011-07-17 ENCOUNTER — Ambulatory Visit (INDEPENDENT_AMBULATORY_CARE_PROVIDER_SITE_OTHER): Payer: BC Managed Care – PPO | Admitting: Internal Medicine

## 2011-07-17 VITALS — BP 112/72 | HR 51 | Ht 73.0 in | Wt 199.1 lb

## 2011-07-17 DIAGNOSIS — I10 Essential (primary) hypertension: Secondary | ICD-10-CM

## 2011-07-17 DIAGNOSIS — R0609 Other forms of dyspnea: Secondary | ICD-10-CM

## 2011-07-17 DIAGNOSIS — I4891 Unspecified atrial fibrillation: Secondary | ICD-10-CM

## 2011-07-17 DIAGNOSIS — R0683 Snoring: Secondary | ICD-10-CM

## 2011-07-17 NOTE — Assessment & Plan Note (Signed)
Stable No change required today Echo prior to ablation to evaluated for structural heart disease

## 2011-07-17 NOTE — Patient Instructions (Addendum)
Stop Aspirin  Your physician has requested that you have an echocardiogram. Echocardiography is a painless test that uses sound waves to create images of your heart. It provides your doctor with information about the size and shape of your heart and how well your heart's chambers and valves are working. This procedure takes approximately one hour. There are no restrictions for this procedure.  See instruction sheet for afib ablation.

## 2011-07-17 NOTE — Progress Notes (Signed)
Primary Care Physician: Izaiyah Kleinman John, MD, MD Referring Physician:  Dr Bensimhon  [name] [name] is a 50 y.o. male with a h/o paroxysmal atrial fibrillation who presents today for EP consultation.  He reports that he was initially diagnosed with atrial fibrillation 8-10 years ago after presenting to a local cardiologist with symptomatic palpitations.  Episodes increased in frequency and duration.  He required cardioversion 11/15/04 and was placed on metoprolol and coumadin.  He was placed on flecainide in 2007.  He continued to have increasing episodes of afib and therefore his flecainide was increased to 150mg BID.  Presently, he feels that he is in afib 4-5 times per week.  He reports symptoms of fatigue and being "washed out" with afib.  He also reports palpitations. Episodes typically last 1-2 hours and are more likely to occur at night.  He is unware of any other triggers or precipitants for his afib. 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 Today, he denies symptoms of chest pain, shortness of breath, orthopnea, PND, lower extremity edema, dizziness, presyncope, syncope, or neurologic sequela. The patient is tolerating medications without difficulties and is otherwise without complaint today.   Past Medical History  Diagnosis Date  . Abdominal pain, epigastric 01/01/2008  . Paroxysmal atrial fibrillation 12/15/2007  . CHEST PAIN-PRECORDIAL 05/25/2009  . ERECTILE DYSFUNCTION, ORGANIC 01/16/2010  . HYPERLIPIDEMIA 12/15/2007  . HYPERTENSION 12/15/2007  . HYPOKALEMIA 12/15/2007  . POSTURAL LIGHTHEADEDNESS 07/06/2008   Past Surgical History  Procedure Date  . Inguinal herniorrhapy 6/08    Current Outpatient Prescriptions  Medication Sig Dispense Refill  . amLODipine (NORVASC) 2.5 MG tablet TAKE ONE TABLET BY MOUTH DAILY  30 tablet  5  . aspirin 81 MG tablet Take 81 mg by mouth daily.       . atorvastatin (LIPITOR) 20 MG tablet TAKE 1 TABLET BY MOUTH EVERY DAY  30 tablet  5  .  CIALIS 20 MG tablet TAKE ONE TABLET BY MOUTH EVERY DAY AS NEEDED  5 each  5  . doxazosin (CARDURA) 1 MG tablet TAKE 1 TABLET BY MOUTH EVERY DAY IN THE EVENING  30 tablet  11  . flecainide (TAMBOCOR) 100 MG tablet Take 1.5 tablets (150 mg total) by mouth 2 (two) times daily.  90 tablet  3  . LEVITRA 20 MG tablet TAKE ONE TABLET BY MOUTH EVERY OTHER DAY AS NEEDED  5 each  11  . losartan (COZAAR) 100 MG tablet TAKE 1 TABLET EVERY DAY  30 tablet  11  . warfarin (COUMADIN) 5 MG tablet TAKE AS DIRECTED  135 tablet  3    Allergies  Allergen Reactions  . Ibuprofen     REACTION: rapid heart beat    History   Social History  . Marital Status: Married    Spouse Name: N/A    Number of Children: 2  . Years of Education: N/A   Occupational History  . SECURITY [employer]   Social History Main Topics  . Smoking status: Former Smoker -- 0.3 packs/day for 15 years    Types: Cigarettes    Quit date: 12/17/2010  . Smokeless tobacco: Never Used  . Alcohol Use: Yes     rare  . Drug Use: No  . Sexually Active: Not on file   Other Topics Concern  . Not on file   Social History Narrative   Pt lives in [place] with spouse. 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    Family History    Problem Relation Age of Onset  . Stroke Mother   . Heart disease Sister     heart problems, OSA and thyroid problems    ROS- All systems are reviewed and negative except as per the HPI above  Physical Exam: Filed Vitals:   07/17/11 0857  BP: 112/72  Pulse: 51  Height: 6' 1" (1.854 m)  Weight: 199 lb 1.9 oz (90.32 kg)    GEN- The patient is well appearing, alert and oriented x 3 today.   Head- normocephalic, atraumatic Eyes-  Sclera clear, conjunctiva pink Ears- hearing intact Oropharynx- clear Neck- supple, no JVP Lymph- no cervical lymphadenopathy Lungs- Clear to ausculation bilaterally, normal work of breathing Heart- Regular rate and rhythm, no murmurs, rubs or  gallops, PMI not laterally displaced GI- soft, NT, ND, + BS Extremities- no clubbing, cyanosis, or edema MS- no significant deformity or atrophy Skin- no rash or lesion Psych- euthymic mood, full affect Neuro- strength and sensation are intact  EKG today reveals afib with V rate 51 bpm, PR 196, QRS 104, Qtc 429 48 hour event monitor 06/07/11 reviewed which reveals both afib and atrial flutter  Assessment and Plan:  

## 2011-07-17 NOTE — Assessment & Plan Note (Signed)
Sleep study results are pending

## 2011-07-17 NOTE — Assessment & Plan Note (Signed)
The patient has symptomatic paroxysmal atrial fibrillation as well as recently detected atrial flutter.  He has failed medical therapy with flecainide and metoprolol.  He is appropriately anticoagulated with coumadin. Therapeutic strategies for afib and atrial flutter including medicine and ablation were discussed in detail with the patient today. Risk, benefits, and alternatives to EP study and radiofrequency ablation were also discussed in detail today. These risks include but are not limited to stroke, bleeding, vascular damage, tamponade, perforation, damage to the esophagus, lungs, and other structures, pulmonary vein stenosis, worsening renal function, and death. The patient understands these risk and wishes to proceed.  We will therefore proceed with catheter ablation at the next available time.  We will follow his INR closely in the interim.

## 2011-07-18 ENCOUNTER — Other Ambulatory Visit: Payer: Self-pay

## 2011-07-18 ENCOUNTER — Ambulatory Visit (HOSPITAL_COMMUNITY): Payer: BC Managed Care – PPO | Attending: Cardiology

## 2011-07-18 DIAGNOSIS — I4891 Unspecified atrial fibrillation: Secondary | ICD-10-CM

## 2011-07-18 DIAGNOSIS — R079 Chest pain, unspecified: Secondary | ICD-10-CM | POA: Insufficient documentation

## 2011-07-18 DIAGNOSIS — I10 Essential (primary) hypertension: Secondary | ICD-10-CM | POA: Insufficient documentation

## 2011-07-18 DIAGNOSIS — E785 Hyperlipidemia, unspecified: Secondary | ICD-10-CM | POA: Insufficient documentation

## 2011-07-23 ENCOUNTER — Ambulatory Visit (INDEPENDENT_AMBULATORY_CARE_PROVIDER_SITE_OTHER): Payer: BC Managed Care – PPO | Admitting: *Deleted

## 2011-07-23 DIAGNOSIS — I4891 Unspecified atrial fibrillation: Secondary | ICD-10-CM

## 2011-07-30 ENCOUNTER — Telehealth: Payer: Self-pay | Admitting: Pulmonary Disease

## 2011-07-30 ENCOUNTER — Other Ambulatory Visit: Payer: Self-pay | Admitting: Internal Medicine

## 2011-07-30 NOTE — Telephone Encounter (Signed)
I spoke with pt and he stated he wanted to know if Dr. Johney Frame would be able to see his sleep results. I advised him we share the same epic system so he should be able to see them. He voiced his understanding and had no other questions

## 2011-07-31 ENCOUNTER — Ambulatory Visit (INDEPENDENT_AMBULATORY_CARE_PROVIDER_SITE_OTHER): Payer: BC Managed Care – PPO | Admitting: *Deleted

## 2011-07-31 DIAGNOSIS — I4891 Unspecified atrial fibrillation: Secondary | ICD-10-CM

## 2011-08-05 ENCOUNTER — Institutional Professional Consult (permissible substitution): Payer: BC Managed Care – PPO | Admitting: Internal Medicine

## 2011-08-07 ENCOUNTER — Ambulatory Visit (INDEPENDENT_AMBULATORY_CARE_PROVIDER_SITE_OTHER): Payer: BC Managed Care – PPO | Admitting: *Deleted

## 2011-08-07 DIAGNOSIS — I4891 Unspecified atrial fibrillation: Secondary | ICD-10-CM

## 2011-08-07 LAB — CBC WITH DIFFERENTIAL/PLATELET
Basophils Relative: 0.3 % (ref 0.0–3.0)
Eosinophils Absolute: 0.2 10*3/uL (ref 0.0–0.7)
MCHC: 33 g/dL (ref 30.0–36.0)
MCV: 90.7 fl (ref 78.0–100.0)
Monocytes Absolute: 0.8 10*3/uL (ref 0.1–1.0)
Neutrophils Relative %: 60.9 % (ref 43.0–77.0)
RBC: 4.62 Mil/uL (ref 4.22–5.81)

## 2011-08-07 LAB — BASIC METABOLIC PANEL WITH GFR
BUN: 21 mg/dL (ref 6–23)
CO2: 27 mEq/L (ref 19–32)
Calcium: 9.7 mg/dL (ref 8.4–10.5)
Creat: 1.17 mg/dL (ref 0.50–1.35)
Glucose, Bld: 102 mg/dL — ABNORMAL HIGH (ref 70–99)

## 2011-08-09 ENCOUNTER — Encounter (HOSPITAL_COMMUNITY): Payer: Self-pay | Admitting: Pharmacy Technician

## 2011-08-09 ENCOUNTER — Other Ambulatory Visit: Payer: Self-pay | Admitting: *Deleted

## 2011-08-09 DIAGNOSIS — I4891 Unspecified atrial fibrillation: Secondary | ICD-10-CM

## 2011-08-12 ENCOUNTER — Telehealth: Payer: Self-pay | Admitting: Internal Medicine

## 2011-08-12 ENCOUNTER — Encounter (HOSPITAL_COMMUNITY)
Admission: RE | Disposition: A | Discharge status: Home / Self Care | Payer: Self-pay | Source: Ambulatory Visit | Attending: Internal Medicine

## 2011-08-12 ENCOUNTER — Ambulatory Visit: Payer: Self-pay | Admitting: Cardiology

## 2011-08-12 ENCOUNTER — Ambulatory Visit (HOSPITAL_COMMUNITY)
Admission: RE | Admit: 2011-08-12 | Discharge: 2011-08-12 | Disposition: A | Discharge status: Home / Self Care | Payer: BC Managed Care – PPO | Source: Ambulatory Visit | Attending: Internal Medicine | Admitting: Internal Medicine

## 2011-08-12 ENCOUNTER — Encounter (HOSPITAL_COMMUNITY): Payer: Self-pay | Admitting: *Deleted

## 2011-08-12 DIAGNOSIS — I4891 Unspecified atrial fibrillation: Secondary | ICD-10-CM

## 2011-08-12 HISTORY — PX: TEE WITHOUT CARDIOVERSION: SHX5443

## 2011-08-12 SURGERY — ECHOCARDIOGRAM, TRANSESOPHAGEAL
Anesthesia: Moderate Sedation

## 2011-08-12 MED ORDER — LIDOCAINE VISCOUS 2 % MT SOLN
OROMUCOSAL | Status: DC | PRN
  Administered 2011-08-12: 5 mL via OROMUCOSAL

## 2011-08-12 MED ORDER — BENZOCAINE 20 % MT SOLN: 1.0000 "application " | OROMUCOSAL | Status: DC | PRN

## 2011-08-12 MED ORDER — SODIUM CHLORIDE 0.9 % IJ SOLN: 3.0000 mL | INTRAMUSCULAR | Status: DC | PRN

## 2011-08-12 MED ORDER — MIDAZOLAM HCL 10 MG/2ML IJ SOLN
INTRAMUSCULAR | Status: AC
  Filled 2011-08-12: qty 4

## 2011-08-12 MED ORDER — FENTANYL CITRATE 0.05 MG/ML IJ SOLN
250.0000 ug | Freq: Once | INTRAMUSCULAR | Status: DC
Start: 2011-08-12 — End: 2011-08-13

## 2011-08-12 MED ORDER — FENTANYL CITRATE 0.05 MG/ML IJ SOLN
INTRAMUSCULAR | Status: AC
  Filled 2011-08-12: qty 4

## 2011-08-12 MED ORDER — SODIUM CHLORIDE 0.9 % IV SOLN
250.0000 mL | INTRAVENOUS | Status: DC | PRN
  Administered 2011-08-12: 500 mL via INTRAVENOUS

## 2011-08-12 MED ORDER — FENTANYL CITRATE 0.05 MG/ML IJ SOLN
INTRAMUSCULAR | Status: DC | PRN
  Administered 2011-08-12: 25 ug via INTRAVENOUS
  Administered 2011-08-12: 12.5 ug via INTRAVENOUS
  Administered 2011-08-12: 25 ug via INTRAVENOUS

## 2011-08-12 MED ORDER — MIDAZOLAM HCL 10 MG/2ML IJ SOLN
INTRAMUSCULAR | Status: DC | PRN
  Administered 2011-08-12: 1 mg via INTRAVENOUS
  Administered 2011-08-12 (×2): 2 mg via INTRAVENOUS

## 2011-08-12 MED ORDER — LIDOCAINE VISCOUS 2 % MT SOLN
OROMUCOSAL | Status: AC
  Filled 2011-08-12: qty 15

## 2011-08-12 MED ORDER — SODIUM CHLORIDE 0.9 % IJ SOLN: 3.0000 mL | Freq: Two times a day (BID) | INTRAMUSCULAR | Status: DC

## 2011-08-12 MED ORDER — MIDAZOLAM HCL 10 MG/2ML IJ SOLN: 10.0000 mg | Freq: Once | INTRAMUSCULAR | Status: DC

## 2011-08-12 NOTE — Progress Notes (Signed)
*  PRELIMINARY RESULTS* Echocardiogram Echocardiogram Transesophageal has been performed.  [name] [name] 08/12/2011, 12:11 PM

## 2011-08-12 NOTE — Interval H&P Note (Signed)
History and Physical Interval Note:  08/12/2011 11:42 AM  [name] [name]  has presented today for surgery, with the diagnosis of a  fib  The various methods of treatment have been discussed with the patient and family. After consideration of risks, benefits and other options for treatment, the patient has consented to  Procedure(s) (LRB): TRANSESOPHAGEAL ECHOCARDIOGRAM (TEE) (N/A) as a surgical intervention .  The patients' history has been reviewed, patient examined, no change in status, stable for surgery.  I have reviewed the patients' chart and labs.  Questions were answered to the patient's satisfaction.     [name] [name]  Patient seen.  Case reviewed.  Agree with findings/plans of J Allred.  Plan for TEE today

## 2011-08-12 NOTE — Telephone Encounter (Signed)
New msg Pt wants to ask a few questions about procedure scheduled for tomorrow. Please call him back

## 2011-08-12 NOTE — H&P (View-Only) (Signed)
Primary Care Physician: Jazmina Muhlenkamp John, MD, MD Referring Physician:  Dr Bensimhon  [name] [name] is a 51 y.o. male with a h/o paroxysmal atrial fibrillation who presents today for EP consultation.  He reports that he was initially diagnosed with atrial fibrillation 8-10 years ago after presenting to a local cardiologist with symptomatic palpitations.  Episodes increased in frequency and duration.  He required cardioversion 11/15/04 and was placed on metoprolol and coumadin.  He was placed on flecainide in 2007.  He continued to have increasing episodes of afib and therefore his flecainide was increased to 150mg BID.  Presently, he feels that he is in afib 4-5 times per week.  He reports symptoms of fatigue and being "washed out" with afib.  He also reports palpitations. Episodes typically last 1-2 hours and are more likely to occur at night.  He is unware of any other triggers or precipitants for his afib. 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 Today, he denies symptoms of chest pain, shortness of breath, orthopnea, PND, lower extremity edema, dizziness, presyncope, syncope, or neurologic sequela. The patient is tolerating medications without difficulties and is otherwise without complaint today.   Past Medical History  Diagnosis Date  . Abdominal pain, epigastric 01/01/2008  . Paroxysmal atrial fibrillation 12/15/2007  . CHEST PAIN-PRECORDIAL 05/25/2009  . ERECTILE DYSFUNCTION, ORGANIC 01/16/2010  . HYPERLIPIDEMIA 12/15/2007  . HYPERTENSION 12/15/2007  . HYPOKALEMIA 12/15/2007  . POSTURAL LIGHTHEADEDNESS 07/06/2008   Past Surgical History  Procedure Date  . Inguinal herniorrhapy 6/08    Current Outpatient Prescriptions  Medication Sig Dispense Refill  . amLODipine (NORVASC) 2.5 MG tablet TAKE ONE TABLET BY MOUTH DAILY  30 tablet  5  . aspirin 81 MG tablet Take 81 mg by mouth daily.       . atorvastatin (LIPITOR) 20 MG tablet TAKE 1 TABLET BY MOUTH EVERY DAY  30 tablet  5  .  CIALIS 20 MG tablet TAKE ONE TABLET BY MOUTH EVERY DAY AS NEEDED  5 each  5  . doxazosin (CARDURA) 1 MG tablet TAKE 1 TABLET BY MOUTH EVERY DAY IN THE EVENING  30 tablet  11  . flecainide (TAMBOCOR) 100 MG tablet Take 1.5 tablets (150 mg total) by mouth 2 (two) times daily.  90 tablet  3  . LEVITRA 20 MG tablet TAKE ONE TABLET BY MOUTH EVERY OTHER DAY AS NEEDED  5 each  11  . losartan (COZAAR) 100 MG tablet TAKE 1 TABLET EVERY DAY  30 tablet  11  . warfarin (COUMADIN) 5 MG tablet TAKE AS DIRECTED  135 tablet  3    Allergies  Allergen Reactions  . Ibuprofen     REACTION: rapid heart beat    History   Social History  . Marital Status: Married    Spouse Name: N/A    Number of Children: 2  . Years of Education: N/A   Occupational History  . SECURITY [employer]   Social History Main Topics  . Smoking status: Former Smoker -- 0.3 packs/day for 15 years    Types: Cigarettes    Quit date: 12/17/2010  . Smokeless tobacco: Never Used  . Alcohol Use: Yes     rare  . Drug Use: No  . Sexually Active: Not on file   Other Topics Concern  . Not on file   Social History Narrative   Pt lives in [place] with spouse. 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    Family History    Problem Relation Age of Onset  . Stroke Mother   . Heart disease Sister     heart problems, OSA and thyroid problems    ROS- All systems are reviewed and negative except as per the HPI above  Physical Exam: Filed Vitals:   07/17/11 0857  BP: 112/72  Pulse: 51  Height: 6' 1" (1.854 m)  Weight: 199 lb 1.9 oz (90.32 kg)    GEN- The patient is well appearing, alert and oriented x 3 today.   Head- normocephalic, atraumatic Eyes-  Sclera clear, conjunctiva pink Ears- hearing intact Oropharynx- clear Neck- supple, no JVP Lymph- no cervical lymphadenopathy Lungs- Clear to ausculation bilaterally, normal work of breathing Heart- Regular rate and rhythm, no murmurs, rubs or  gallops, PMI not laterally displaced GI- soft, NT, ND, + BS Extremities- no clubbing, cyanosis, or edema MS- no significant deformity or atrophy Skin- no rash or lesion Psych- euthymic mood, full affect Neuro- strength and sensation are intact  EKG today reveals afib with V rate 51 bpm, PR 196, QRS 104, Qtc 429 48 hour event monitor 06/07/11 reviewed which reveals both afib and atrial flutter  Assessment and Plan:  

## 2011-08-12 NOTE — Telephone Encounter (Signed)
INR today is 2.5  Spoke with patient all is good  He is still on for tomorrow

## 2011-08-12 NOTE — Op Note (Signed)
LA and LA appendage without masses. No PFO by color doppler or with injection of agitated saline. TV normal.  Tr TR PV normal AV normal  No AI MV normal.  At least mild centrally directed MR NOrmal LV function. Normal thoracic aorta.

## 2011-08-13 ENCOUNTER — Encounter (HOSPITAL_COMMUNITY)
Admission: RE | Disposition: A | Discharge status: Home / Self Care | Payer: Self-pay | Source: Ambulatory Visit | Attending: Internal Medicine

## 2011-08-13 ENCOUNTER — Encounter (HOSPITAL_COMMUNITY): Payer: Self-pay | Admitting: Internal Medicine

## 2011-08-13 ENCOUNTER — Ambulatory Visit (HOSPITAL_COMMUNITY): Admit: 2011-08-13 | Payer: Self-pay | Admitting: Internal Medicine

## 2011-08-13 ENCOUNTER — Ambulatory Visit (HOSPITAL_COMMUNITY): Admit: 2011-08-13 | Payer: BC Managed Care – PPO | Admitting: Internal Medicine

## 2011-08-13 ENCOUNTER — Ambulatory Visit (HOSPITAL_COMMUNITY): Payer: BC Managed Care – PPO | Admitting: *Deleted

## 2011-08-13 ENCOUNTER — Encounter (HOSPITAL_COMMUNITY): Payer: Self-pay | Admitting: *Deleted

## 2011-08-13 ENCOUNTER — Ambulatory Visit (HOSPITAL_COMMUNITY)
Admission: RE | Admit: 2011-08-13 | Discharge: 2011-08-14 | Disposition: A | Discharge status: Home / Self Care | Payer: BC Managed Care – PPO | Source: Ambulatory Visit | Attending: Internal Medicine | Admitting: Internal Medicine

## 2011-08-13 DIAGNOSIS — E785 Hyperlipidemia, unspecified: Secondary | ICD-10-CM | POA: Insufficient documentation

## 2011-08-13 DIAGNOSIS — N529 Male erectile dysfunction, unspecified: Secondary | ICD-10-CM | POA: Insufficient documentation

## 2011-08-13 DIAGNOSIS — I4891 Unspecified atrial fibrillation: Secondary | ICD-10-CM

## 2011-08-13 DIAGNOSIS — I4819 Other persistent atrial fibrillation: Secondary | ICD-10-CM

## 2011-08-13 DIAGNOSIS — I4892 Unspecified atrial flutter: Secondary | ICD-10-CM | POA: Insufficient documentation

## 2011-08-13 DIAGNOSIS — I10 Essential (primary) hypertension: Secondary | ICD-10-CM | POA: Insufficient documentation

## 2011-08-13 HISTORY — PX: ATRIAL FIBRILLATION ABLATION: SHX5456

## 2011-08-13 LAB — MRSA PCR SCREENING: MRSA by PCR: NEGATIVE

## 2011-08-13 LAB — PROTIME-INR: Prothrombin Time: 27.3 seconds — ABNORMAL HIGH (ref 11.6–15.2)

## 2011-08-13 LAB — POCT ACTIVATED CLOTTING TIME
Activated Clotting Time: 232 seconds
Activated Clotting Time: 243 seconds

## 2011-08-13 SURGERY — ATRIAL FIBRILLATION ABLATION
Anesthesia: Monitor Anesthesia Care

## 2011-08-13 MED ORDER — PROTAMINE SULFATE 10 MG/ML IV SOLN
20.0000 mg | Freq: Once | INTRAVENOUS | Status: AC
  Administered 2011-08-13: 20 mg via INTRAVENOUS
  Filled 2011-08-13: qty 5

## 2011-08-13 MED ORDER — SODIUM CHLORIDE 0.9 % IJ SOLN
3.0000 mL | Freq: Two times a day (BID) | INTRAMUSCULAR | Status: DC
  Administered 2011-08-13 – 2011-08-14 (×2): 3 mL via INTRAVENOUS

## 2011-08-13 MED ORDER — ONDANSETRON HCL 4 MG/2ML IJ SOLN: 4.0000 mg | Freq: Four times a day (QID) | INTRAMUSCULAR | Status: DC | PRN

## 2011-08-13 MED ORDER — HYDROXYUREA 500 MG PO CAPS
ORAL_CAPSULE | ORAL | Status: AC
  Filled 2011-08-13: qty 1

## 2011-08-13 MED ORDER — WARFARIN - PHYSICIAN DOSING INPATIENT: Freq: Every day | Status: DC

## 2011-08-13 MED ORDER — SODIUM CHLORIDE 0.9 % IV SOLN
INTRAVENOUS | Status: DC
  Administered 2011-08-13: 12:00:00 via INTRAVENOUS

## 2011-08-13 MED ORDER — SODIUM CHLORIDE 0.9 % IJ SOLN: 3.0000 mL | INTRAMUSCULAR | Status: DC | PRN

## 2011-08-13 MED ORDER — SODIUM CHLORIDE 0.9 % IV SOLN: INTRAVENOUS | Status: DC

## 2011-08-13 MED ORDER — SODIUM CHLORIDE 0.9 % IV SOLN
INTRAVENOUS | Status: DC | PRN
  Administered 2011-08-13: 12:00:00 via INTRAVENOUS

## 2011-08-13 MED ORDER — SODIUM CHLORIDE 0.9 % IV SOLN: 250.0000 mL | INTRAVENOUS | Status: DC | PRN

## 2011-08-13 MED ORDER — DOXAZOSIN MESYLATE 4 MG PO TABS
4.0000 mg | ORAL_TABLET | Freq: Every day | ORAL | Status: DC
  Administered 2011-08-13: 4 mg via ORAL
  Filled 2011-08-13 (×2): qty 1

## 2011-08-13 MED ORDER — ISOPROTERENOL HCL 0.2 MG/ML IJ SOLN
1000.0000 ug | INTRAVENOUS | Status: DC | PRN
  Administered 2011-08-13: 20 ug/min via INTRAVENOUS

## 2011-08-13 MED ORDER — ONDANSETRON HCL 4 MG/2ML IJ SOLN: 4.0000 mg | Freq: Once | INTRAMUSCULAR | Status: DC | PRN

## 2011-08-13 MED ORDER — PROTAMINE SULFATE 10 MG/ML IV SOLN
INTRAVENOUS | Status: DC | PRN
  Administered 2011-08-13: 30 mg via INTRAVENOUS

## 2011-08-13 MED ORDER — HYDROMORPHONE HCL PF 1 MG/ML IJ SOLN: 0.2500 mg | INTRAMUSCULAR | Status: DC | PRN

## 2011-08-13 MED ORDER — HEPARIN SODIUM (PORCINE) 1000 UNIT/ML IJ SOLN
INTRAMUSCULAR | Status: DC | PRN
  Administered 2011-08-13 (×2): 3000 [IU] via INTRAVENOUS

## 2011-08-13 MED ORDER — FENTANYL CITRATE 0.05 MG/ML IJ SOLN
INTRAMUSCULAR | Status: DC | PRN
  Administered 2011-08-13: 25 ug via INTRAVENOUS
  Administered 2011-08-13: 50 ug via INTRAVENOUS
  Administered 2011-08-13: 100 ug via INTRAVENOUS
  Administered 2011-08-13: 50 ug via INTRAVENOUS
  Administered 2011-08-13: 25 ug via INTRAVENOUS

## 2011-08-13 MED ORDER — PROPOFOL 10 MG/ML IV EMUL
INTRAVENOUS | Status: DC | PRN
  Administered 2011-08-13: 100 ug/kg/min via INTRAVENOUS

## 2011-08-13 MED ORDER — BUPIVACAINE HCL (PF) 0.25 % IJ SOLN
INTRAMUSCULAR | Status: AC
Start: 2011-08-13 — End: 2011-08-13
  Filled 2011-08-13: qty 30

## 2011-08-13 MED ORDER — FLECAINIDE ACETATE 50 MG PO TABS
150.0000 mg | ORAL_TABLET | Freq: Two times a day (BID) | ORAL | Status: DC
  Administered 2011-08-13 – 2011-08-14 (×2): 150 mg via ORAL
  Filled 2011-08-13 (×3): qty 1

## 2011-08-13 MED ORDER — ACETAMINOPHEN 325 MG PO TABS: 650.0000 mg | ORAL_TABLET | ORAL | Status: DC | PRN

## 2011-08-13 MED ORDER — WARFARIN SODIUM 7.5 MG PO TABS
7.5000 mg | ORAL_TABLET | Freq: Every day | ORAL | Status: DC
  Administered 2011-08-13: 7.5 mg via ORAL
  Filled 2011-08-13 (×2): qty 1

## 2011-08-13 MED ORDER — MIDAZOLAM HCL 5 MG/5ML IJ SOLN
INTRAMUSCULAR | Status: DC | PRN
  Administered 2011-08-13: 2 mg via INTRAVENOUS
  Administered 2011-08-13 (×2): 0.5 mg via INTRAVENOUS
  Administered 2011-08-13: 1 mg via INTRAVENOUS

## 2011-08-13 MED ORDER — HEPARIN SODIUM (PORCINE) 1000 UNIT/ML IJ SOLN
INTRAMUSCULAR | Status: AC
  Filled 2011-08-13: qty 1

## 2011-08-13 MED ORDER — HYDROCODONE-ACETAMINOPHEN 5-325 MG PO TABS
1.0000 | ORAL_TABLET | ORAL | Status: DC | PRN
  Administered 2011-08-13 – 2011-08-14 (×3): 1 via ORAL
  Filled 2011-08-13 (×3): qty 1

## 2011-08-13 NOTE — Op Note (Signed)
SURGEON:  Hillis Range, MD  PREPROCEDURE DIAGNOSES: 1. Paroxysmal atrial fibrillation. 2. Typical appearing atrial flutter  POSTPROCEDURE DIAGNOSES: 1. Paroxysmal  atrial fibrillation. 2. Right atrial flutter  PROCEDURES: 1. Comprehensive electrophysiologic study. 2. Coronary sinus pacing and recording. 3. Three-dimensional mapping of supraventricular tachycardia (atrial fibrillation and atrial flutter). 4. Ablation of supraventricular tachycardia (atrial fibrillation and atrial flutter). 5. Arterial blood pressure monitoring. 6. Intracardiac echocardiography. 7. Transseptal puncture of an intact septum. 8. Rotational Angiography with processing at an independent workstation 9. Arrhythmia induction with pacing with isuprel infusion  INTRODUCTION:  [name] [name] is a 51 y.o. male with a history of paroxysmal atrial fibrillation and typical appearing atrial flutter who now presents for EP study and radiofrequency ablation.  The patient reports initially being diagnosed with atrial fibrillation several years ago after presenting with symptomatic palpitations and fatgiue. The patient reports increasing frequency and duration of atrial arrhythmias since that time.  The patient has failed medical therapy with flecainide.  The patient therefore presents today for catheter ablation of atrial fibrillation and atrial flutter.  DESCRIPTION OF PROCEDURE:  Informed written consent was obtained, and the patient was brought to the electrophysiology lab in a fasting state.  The patient was adequately sedated with intravenous medications as outlined in the anesthesia report.  The patient's left and right groins were prepped and draped in the usual sterile fashion by the EP lab staff.  Using a percutaneous Seldinger technique, two 7-French and one 8-French hemostasis sheaths were placed into the right common femoral vein.  A 4- [place] hemostasis sheath was placed in the right common femoral artery for blood  pressure monitoring.  An 11-French hemostasis sheath was placed into the left common femoral vein.   3 Dimensional Rotational Angiography: A 5 french pigtail catheter was introduced through the right common femoral vein and advanced into the inferior venocava.  3 demential rotational angiography was then performed by power injection of 100cc of nonionic contrast.  Reprocessing at an independent work station was then performed.   This demonstrated a moderate sized left atrium with 4 separate pulmonary veins which were also moderate in size.  There were no anomalous veins or significant abnormalities.  A 3 dimensional rendering of the left atrium was then merged using [employer] onto the Well[employer] system and registered with intracardiac echo (see below).  The pigtail catheter was then removed.  Catheter Placement:  A 7-French Biosense Webster Decapolar coronary sinus catheter was introduced through the right common femoral vein and advanced into the coronary sinus for recording and pacing from this location.  A 6-French quadripolar Josephson catheter was introduced through the right common femoral vein and advanced into the right ventricle for recording and pacing.  This catheter was then pulled back to the His bundle location.    Initial Measurements: The patient presented to the electrophysiology lab in sinus rhythm.  His PR interval measured 203 msec with a QRS duration of 108 msec and a QT interval of 1256 msec.  The AH interval measured 100 and the HV interval measured 52 msec.     Intracardiac Echocardiography: A 10-French Biosense Webster AcuNav intracardiac echocardiography catheter was introduced through the left common femoral vein and advanced into the right atrium. Intracardiac echocardiography was performed of the left atrium, and a three-dimensional anatomical rendering of the left atrium was performed using CARTO sound technology.  The patient was noted to have a moderate  sized left atrium.  The interatrial septum was prominent but not  aneurysmal. All 4 pulmonary veins were visualized and noted to have separate ostia.  The pulmonary veins were moderate in size.  The left atrial appendage was visualized and did not reveal thrombus.   There was no evidence of pulmonary vein stenosis.   Transseptal Puncture: The middle right common femoral vein sheath was exchanged for an 8.5 [place] SL2 transseptal sheath and transseptal access was achieved in a standard fashion using a Brockenbrough needle under biplane fluoroscopy with intracardiac echocardiography confirmation of the transseptal puncture.  Once transseptal access had been achieved, heparin was administered intravenously and intra- arterially in order to maintain an ACT of greater than 350 seconds throughout the procedure.    3D Mapping and Ablation: The His bundle catheter was removed and in its place a 3.5 mm Biosense Webster EZ [employer] ablation catheter was advanced into the right atrium.  The transseptal sheath was pulled back into the IVC over a guidewire.  The ablation catheter was advanced across the transseptal hole using the wire as a guide.  The transseptal sheath was then re-advanced over the guidewire into the left atrium.  A duodecapolar Biosense Webster circular mapping catheter was introduced through the transseptal sheath and positioned over the mouth of all 4 pulmonary veins.  Three-dimensional electroanatomical mapping was performed using CARTO technology.  This demonstrated electrical activity within all four pulmonary veins at baseline. The patient underwent successful sequential electrical isolation and anatomical encircling of all four pulmonary veins using radiofrequency current with a circular mapping catheter as a guide.  The ablation catheter was then pulled back into the right atrial and positioned along the cavo-tricuspid isthmus.  Mapping along the atrial side of the isthmus was performed.   This demonstrated a standard isthmus.  A series of radiofrequency applications were then delivered along the isthmus.  Complete bidirectional cavotricuspid isthmus block was achieved as confirmed by differential atrial pacing from the low lateral right atrium.  A stimulus to earliest atrial activation across the isthmus measured 150 msec bi-directionally.  The patient was observe without return of conduction through the isthmus.  Measurements Following Ablation: Following ablation, Isuprel was infused up to 20 mcg/min with no inducible atrial fibrillation, atrial tachycardia, atrial flutter, or sustained PACs. In sinus rhythm with RR interval was 973, with PR , QRS 124 msec, and Qtc 493 msec.  Following ablation the AH interval measured with an HV interval of 45 msec. Ventricular pacing was performed, which revealed midline decremental VA conduction with a VA Wenckebach cycle length of 580 msec.  Rapid atrial pacing was performed, which revealed an AV Wenckebach cycle length of 400 msec.  Electroisolation was then again confirmed in all four pulmonary veins.  Intracardiac echocardiography was again performed, which revealed no pericardial effusion.  The procedure was therefore considered completed.  All catheters were removed, and the sheaths were aspirated and flushed.  The patient was transferred to the recovery area for sheath removal per protocol.  A limited bedside transthoracic echocardiogram revealed no pericardial effusion.  There were no early apparent complications.  CONCLUSIONS: 1. Sinus rhythm upon presentation.  2. Rotational Angiography reveals a moderate sized left atrium with four separate pulmonary veins without evidence of pulmonary vein stenosis. 3. Successful electrical isolation and anatomical encircling of all four pulmonary veins with radiofrequency current. 4. Cavo-tricuspid isthmus ablation was performed with complete bidirectional isthmus block achieved.  5. No  inducible arrhythmias following ablation both on and off of Isuprel 6. No early apparent complications.   Fayrene Fearing Mehgan Santmyer,MD 4:27  PM 08/13/2011

## 2011-08-13 NOTE — Interval H&P Note (Signed)
History and Physical Interval Note:  08/13/2011 12:49 PM  [name] [name]  has presented today for surgery, with the diagnosis of a-fib and atrial flutter. The various methods of treatment have been discussed with the patient and family. After consideration of risks, benefits and other options for treatment, the patient has consented to  Procedure(s) (LRB): EP study and radiofrequency ablation (N/A) as a surgical intervention .  The patients' history has been reviewed, patient examined, no change in status, stable for surgery.  I have reviewed the patients' chart and labs.  Questions were answered to the patient's satisfaction.     Hillis Range

## 2011-08-13 NOTE — Transfer of Care (Signed)
Immediate Anesthesia Transfer of Care Note  Patient: [name] [name]  Procedure(s) Performed: Procedure(s) (LRB): ATRIAL FIBRILLATION ABLATION (N/A)  Patient Location: PACU  Anesthesia Type: MAC  Level of Consciousness: awake, sedated and patient cooperative  Airway & Oxygen Therapy: Patient Spontanous Breathing and Patient connected to face mask oxygen  Post-op Assessment: Report given to PACU RN, Post -op Vital signs reviewed and stable and Patient moving all extremities  Post vital signs: Reviewed and stable  Complications: No apparent anesthesia complications

## 2011-08-13 NOTE — Preoperative (Signed)
Beta Blockers   Reason not to administer Beta Blockers:Not Applicable 

## 2011-08-13 NOTE — H&P (View-Only) (Signed)
Primary Care Physician: Oliver Barre, MD, MD Referring Physician:  Dr Kerrin Champagne is a 51 y.o. male with a h/o paroxysmal atrial fibrillation who presents today for EP consultation.  He reports that he was initially diagnosed with atrial fibrillation 8-10 years ago after presenting to a local cardiologist with symptomatic palpitations.  Episodes increased in frequency and duration.  He required cardioversion 11/15/04 and was placed on metoprolol and coumadin.  He was placed on flecainide in 2007.  He continued to have increasing episodes of afib and therefore his flecainide was increased to 150mg  BID.  Presently, he feels that he is in afib 4-5 times per week.  He reports symptoms of fatigue and being "washed out" with afib.  He also reports palpitations. Episodes typically last 1-2 hours and are more likely to occur at night.  He is unware of any other triggers or precipitants for his afib. 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 Today, he denies symptoms of chest pain, shortness of breath, orthopnea, PND, lower extremity edema, dizziness, presyncope, syncope, or neurologic sequela. The patient is tolerating medications without difficulties and is otherwise without complaint today.   Past Medical History  Diagnosis Date  . Abdominal pain, epigastric 01/01/2008  . Paroxysmal atrial fibrillation 12/15/2007  . CHEST PAIN-PRECORDIAL 05/25/2009  . ERECTILE DYSFUNCTION, ORGANIC 01/16/2010  . HYPERLIPIDEMIA 12/15/2007  . HYPERTENSION 12/15/2007  . HYPOKALEMIA 12/15/2007  . POSTURAL LIGHTHEADEDNESS 07/06/2008   Past Surgical History  Procedure Date  . Inguinal herniorrhapy 6/08    Current Outpatient Prescriptions  Medication Sig Dispense Refill  . amLODipine (NORVASC) 2.5 MG tablet TAKE ONE TABLET BY MOUTH DAILY  30 tablet  5  . aspirin 81 MG tablet Take 81 mg by mouth daily.       Marland Kitchen atorvastatin (LIPITOR) 20 MG tablet TAKE 1 TABLET BY MOUTH EVERY DAY  30 tablet  5  .  CIALIS 20 MG tablet TAKE ONE TABLET BY MOUTH EVERY DAY AS NEEDED  5 each  5  . doxazosin (CARDURA) 1 MG tablet TAKE 1 TABLET BY MOUTH EVERY DAY IN THE EVENING  30 tablet  11  . flecainide (TAMBOCOR) 100 MG tablet Take 1.5 tablets (150 mg total) by mouth 2 (two) times daily.  90 tablet  3  . LEVITRA 20 MG tablet TAKE ONE TABLET BY MOUTH EVERY OTHER DAY AS NEEDED  5 each  11  . losartan (COZAAR) 100 MG tablet TAKE 1 TABLET EVERY DAY  30 tablet  11  . warfarin (COUMADIN) 5 MG tablet TAKE AS DIRECTED  135 tablet  3    Allergies  Allergen Reactions  . Ibuprofen     REACTION: rapid heart beat    History   Social History  . Marital Status: Married    Spouse Name: N/A    Number of Children: 2  . Years of Education: N/A   Occupational History  . SECURITY [employer]   Social History Main Topics  . Smoking status: Former Smoker -- 0.3 packs/day for 15 years    Types: Cigarettes    Quit date: 12/17/2010  . Smokeless tobacco: Never Used  . Alcohol Use: Yes     rare  . Drug Use: No  . Sexually Active: Not on file   Other Topics Concern  . Not on file   Social History Narrative   Pt lives in [place] with spouse. 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    Family History  Problem Relation Age of Onset  . Stroke Mother   . Heart disease Sister     heart problems, OSA and thyroid problems    ROS- All systems are reviewed and negative except as per the HPI above  Physical Exam: Filed Vitals:   07/17/11 0857  BP: 112/72  Pulse: 51  Height: 6\' 1"  (1.854 m)  Weight: 199 lb 1.9 oz (90.32 kg)    GEN- The patient is well appearing, alert and oriented x 3 today.   Head- normocephalic, atraumatic Eyes-  Sclera clear, conjunctiva pink Ears- hearing intact Oropharynx- clear Neck- supple, no JVP Lymph- no cervical lymphadenopathy Lungs- Clear to ausculation bilaterally, normal work of breathing Heart- Regular rate and rhythm, no murmurs, rubs or  gallops, PMI not laterally displaced GI- soft, NT, ND, + BS Extremities- no clubbing, cyanosis, or edema MS- no significant deformity or atrophy Skin- no rash or lesion Psych- euthymic mood, full affect Neuro- strength and sensation are intact  EKG today reveals afib with V rate 51 bpm, PR 196, QRS 104, Qtc 429 48 hour event monitor 06/07/11 reviewed which reveals both afib and atrial flutter  Assessment and Plan:

## 2011-08-13 NOTE — Anesthesia Postprocedure Evaluation (Signed)
  Anesthesia Post-op Note  Patient: [name] [name]  Procedure(s) Performed: Procedure(s) (LRB): ATRIAL FIBRILLATION ABLATION (N/A)  Patient Location: Cath Lab  Anesthesia Type: MAC  Level of Consciousness: awake, oriented and patient cooperative  Airway and Oxygen Therapy: Patient Spontanous Breathing and Patient connected to nasal cannula oxygen  Post-op Pain: none  Post-op Assessment: Post-op Vital signs reviewed, Patient's Cardiovascular Status Stable, Respiratory Function Stable, Patent Airway, No signs of Nausea or vomiting and Pain level controlled  Post-op Vital Signs: stable  Complications: No apparent anesthesia complications

## 2011-08-13 NOTE — Anesthesia Preprocedure Evaluation (Addendum)
Anesthesia Evaluation  Patient identified by MRN, date of birth, ID band Patient awake    Reviewed: Allergy & Precautions, H&P , NPO status , Patient's Chart, lab work & pertinent test results  History of Anesthesia Complications Negative for: history of anesthetic complications  Airway Mallampati: I TM Distance: >3 FB Neck ROM: full    Dental  (+) Dental Advisory Given   Pulmonary Current Smoker,          Cardiovascular hypertension, Pt. on medications + dysrhythmias Atrial Fibrillation Rhythm:irregular Rate:Tachycardia     Neuro/Psych negative neurological ROS     GI/Hepatic negative GI ROS, Neg liver ROS,   Endo/Other  negative endocrine ROS  Renal/GU negative Renal ROS  negative genitourinary   Musculoskeletal   Abdominal   Peds  Hematology negative hematology ROS (+)   Anesthesia Other Findings   Reproductive/Obstetrics                          Anesthesia Physical Anesthesia Plan  ASA: III  Anesthesia Plan: MAC   Post-op Pain Management:    Induction: Intravenous  Airway Management Planned: Simple Face Mask  Additional Equipment:   Intra-op Plan:   Post-operative Plan:   Informed Consent: I have reviewed the patients History and Physical, chart, labs and discussed the procedure including the risks, benefits and alternatives for the proposed anesthesia with the patient or authorized representative who has indicated his/her understanding and acceptance.     Plan Discussed with: Anesthesiologist, CRNA and Surgeon  Anesthesia Plan Comments:         Anesthesia Quick Evaluation

## 2011-08-14 ENCOUNTER — Telehealth: Payer: Self-pay | Admitting: Internal Medicine

## 2011-08-14 DIAGNOSIS — I4819 Other persistent atrial fibrillation: Secondary | ICD-10-CM

## 2011-08-14 DIAGNOSIS — I4891 Unspecified atrial fibrillation: Secondary | ICD-10-CM

## 2011-08-14 LAB — BASIC METABOLIC PANEL
CO2: 24 mEq/L (ref 19–32)
Chloride: 108 mEq/L (ref 96–112)
GFR calc Af Amer: >90 mL/min (ref >90)
Potassium: 3.6 mEq/L (ref 3.5–5.1)

## 2011-08-14 LAB — PROTIME-INR
INR: 2.7 — ABNORMAL HIGH (ref 0.00–1.49)
Prothrombin Time: 29.1 seconds — ABNORMAL HIGH (ref 11.6–15.2)

## 2011-08-14 LAB — POCT ACTIVATED CLOTTING TIME: Activated Clotting Time: 166 seconds

## 2011-08-14 MED ORDER — PANTOPRAZOLE SODIUM 40 MG PO TBEC: 40.0000 mg | DELAYED_RELEASE_TABLET | Freq: Every day | ORAL | Status: DC

## 2011-08-14 NOTE — Discharge Instructions (Signed)
***  PLEASE REMEMBER TO BRING ALL OF YOUR MEDICATIONS TO EACH OF YOUR FOLLOW-UP OFFICE VISITS.  NO HEAVY LIFTING OR SEXUAL ACTIVITY X 7 DAYS. NO DRIVING X 2-3 DAYS. NO SOAKING BATHS, HOT TUBS, POOLS, ETC., X 7 DAYS.  

## 2011-08-14 NOTE — Telephone Encounter (Signed)
New msg Pt had procedure yesterday and wanted to talk to you

## 2011-08-14 NOTE — Progress Notes (Signed)
IV removed. D/C instructions given, pt and wife verbalized understanding of all instructions given and will comply. Pt taken to main entrance via wheelchair and assisted into vehicle.

## 2011-08-14 NOTE — Discharge Summary (Signed)
Patient ID: [name] [name],  MRN: [id number], DOB/AGE: 1960-11-16 51 y.o.  Admit date: 08/13/2011 Discharge date: 08/14/2011  Primary Care Provider: Oliver Barre, MD Primary Cardiologist: J. Allred, MD  Discharge Diagnoses Principal Problem:  *Paroxysmal atrial fibrillation Active Problems:  HYPERLIPIDEMIA  HYPERTENSION  Erectile dysfunction  Paroxysmal atrial flutter  Allergies Allergies  Allergen Reactions  . Ibuprofen     REACTION: rapid heart beat    Procedures  08/13/2011 EP study followed by atrial fibrillation and right atrial flutter ablation  CONCLUSIONS:  1. Sinus rhythm upon presentation.  2. Rotational Angiography reveals a moderate sized left atrium with four separate pulmonary veins without evidence of pulmonary vein stenosis.  3. Successful electrical isolation and anatomical encircling of all four pulmonary veins with radiofrequency current.  4. Cavo-tricuspid isthmus ablation was performed with complete bidirectional isthmus block achieved.  5. No inducible arrhythmias following ablation both on and off of Isuprel  6. No early apparent complications. ___________________________  History of Present Illness  51 y/o male with the above problem list.  He was recently seen in clinic by Dr. Johney Frame secondary to symptomatic PAF along with recently detected atrial flutter despite antiarrhythmic therapy.  Decision was made to pursue EP study with afib & flutter ablation.  Pt subsequently underwent TEE on August 12, 2011, which showed no evidence of left atrial or left atrial appendage thrombus.  Arrangements were then finalized for EPS and catheter ablation on 08/13/2011.  Hospital Course  Pt presented to the EP lab on 08/13/2011.  He underwent electrophysiologic study followed by successful catheter ablation for his a. Flutter and a. Fib.  He tolerated this procedure well and post-procedure has been ambulating without symptoms, limitations, or recurrent arrhythmias.  He  will be discharged home today in good condition.  Discharge Vitals Blood pressure 110/71, pulse 49, temperature 98.3 F (36.8 C), temperature source Oral, resp. rate 14, height 6\' 1"  (1.854 m), weight 198 lb 13.7 oz (90.2 kg), SpO2 100.00%.  Filed Weights   08/13/11 1700  Weight: 198 lb 13.7 oz (90.2 kg)    Labs  Basic Metabolic Panel  Basename 08/14/11 0525  NA 140  K 3.6  CL 108  CO2 24  GLUCOSE 86  BUN 12  CREATININE 1.03  CALCIUM 8.4  MG --  PHOS --   Disposition  Pt is being discharged home today in good condition.  Follow-up Plans & Appointments  Follow-up Information    Follow up with Hillis Range, MD on 11/07/2011. (10:15 AM)    Contact information:   [address], [address] [place] [address] [phone]       Follow up with [facility] on 08/22/2011. (8:30 AM)       Follow up with Oliver Barre, MD on 02/14/2012. (9:00 AM)    Contact information:   [address] [facility] [address] [place] [address] [place] [address] [phone]          Discharge Medications  Medication List  As of 08/14/2011  9:37 AM   TAKE these medications         amLODipine 2.5 MG tablet   Commonly known as: NORVASC   Take 2.5 mg by mouth daily.      atorvastatin 20 MG tablet   Commonly known as: LIPITOR   Take 20 mg by mouth daily.      B-complex with vitamin C tablet   Take 1 tablet by mouth daily.      doxazosin  4 MG tablet   Commonly known as: CARDURA   Take 4 mg by mouth at bedtime.      flecainide 100 MG tablet   Commonly known as: TAMBOCOR   Take 100 mg by mouth 2 (two) times daily.      losartan 100 MG tablet   Commonly known as: COZAAR   Take 100 mg by mouth daily.      omega-3 acid ethyl esters 1 G capsule   Commonly known as: LOVAZA   Take 1 g by mouth 2 (two) times daily.      pantoprazole 40 MG tablet   Commonly known as: PROTONIX   Take 1 tablet (40 mg total) by mouth daily.      tadalafil 20 MG  tablet   Commonly known as: CIALIS   Take 20 mg by mouth daily as needed. Erectile dysfunction      vardenafil 20 MG tablet   Commonly known as: LEVITRA   Take 20 mg by mouth every other day.      warfarin 7.5 MG tablet   Commonly known as: COUMADIN   Take 7.5 mg by mouth daily.            Outstanding Labs/Studies  F/U INR as scheduled above.  Duration of Discharge Encounter   Greater than 30 minutes including physician time.  Signed, Nicolasa Ducking NP 08/14/2011, 9:37 AM

## 2011-08-14 NOTE — Telephone Encounter (Signed)
Spoke with patient  He is home now and was wanting pain medications.  I have asked him to try Tylenol and if he does not get any relief to call me tomorrow.  He has some Tylox at home that he may try if regular Tylenol does not work for him  He will call me back if he feels he needs more than regular Tylenol

## 2011-08-14 NOTE — Progress Notes (Signed)
SUBJECTIVE: The patient is doing well today s/p afib ablation.  At this time, he denies chest pain, shortness of breath, or any new concerns.     . doxazosin  4 mg Oral QHS  . flecainide  150 mg Oral BID  . hydroxyurea      . protamine  20 mg Intravenous Once  . sodium chloride  3 mL Intravenous Q12H  . warfarin  7.5 mg Oral q1800  . Warfarin - Physician Dosing Inpatient   Does not apply q1800      . DISCONTD: sodium chloride 50 mL/hr at 08/13/11 1213    OBJECTIVE: Physical Exam: Filed Vitals:   08/14/11 0500 08/14/11 0600 08/14/11 0700 08/14/11 0731  BP: 94/65 103/64 106/64   Pulse: 47 44 45   Temp:    98.3 F (36.8 C)  TempSrc:    Oral  Resp: 12 0 16   Height:      Weight:      SpO2: 100% 100% 100%     Intake/Output Summary (Last 24 hours) at 08/14/11 0753 Last data filed at 08/13/11 2112  Gross per 24 hour  Intake   1040 ml  Output    400 ml  Net    640 ml    Telemetry reveals sinus rhythm HRs 40s-60s  GEN- The patient is well appearing, alert and oriented x 3 today.   Head- normocephalic, atraumatic Eyes-  Sclera clear, conjunctiva pink Ears- hearing intact Oropharynx- clear Neck- supple, no JVP Lymph- no cervical lymphadenopathy Lungs- Clear to ausculation bilaterally, normal work of breathing Heart- Regular rate and rhythm, no murmurs, rubs or gallops, PMI not laterally displaced GI- soft, NT, ND, + BS Extremities- no clubbing, cyanosis, or edema, no hematoma/ bruits  LABS: Basic Metabolic Panel:  Basename 08/14/11 0525  NA 140  K 3.6  CL 108  CO2 24  GLUCOSE 86  BUN 12  CREATININE 1.03  CALCIUM 8.4  MG --  PHOS --   Liver Function Tests: No results found for this basename: AST:2,ALT:2,ALKPHOS:2,BILITOT:2,PROT:2,ALBUMIN:2 in the last 72 hours No results found for this basename: LIPASE:2,AMYLASE:2 in the last 72 hours CBC: No results found for this basename: WBC:2,NEUTROABS:2,HGB:2,HCT:2,MCV:2,PLT:2 in the last 72 hours Cardiac  Enzymes: No results found for this basename: CKTOTAL:3,CKMB:3,CKMBINDEX:3,TROPONINI:3 in the last 72 hours BNP: No components found with this basename: POCBNP:3 D-Dimer: No results found for this basename: DDIMER:2 in the last 72 hours Hemoglobin A1C: No results found for this basename: HGBA1C in the last 72 hours Fasting Lipid Panel: No results found for this basename: CHOL,HDL,LDLCALC,TRIG,CHOLHDL,LDLDIRECT in the last 72 hours Thyroid Function Tests: No results found for this basename: TSH,T4TOTAL,FREET3,T3FREE,THYROIDAB in the last 72 hours Anemia Panel: No results found for this basename: VITAMINB12,FOLATE,FERRITIN,TIBC,IRON,RETICCTPCT in the last 72 hours  RADIOLOGY: No results found.  ASSESSMENT AND PLAN:  Active Problems:  Atrial fibrillation  1. afib- doing well s/p ablation Continue flecainide and coumadin Add protonix 40mg  daily x 6 weeks  Follow-up with me in 12 weeks  DC to home today  Hillis Range, MD 08/14/2011 7:53 AM

## 2011-08-17 NOTE — Discharge Summary (Signed)
I have seen, examined the patient, and reviewed the above assessment and plan.   Co Sign: Marquavius Scaife, MD  

## 2011-08-19 ENCOUNTER — Telehealth: Payer: Self-pay | Admitting: Internal Medicine

## 2011-08-19 ENCOUNTER — Encounter: Payer: Self-pay | Admitting: *Deleted

## 2011-08-19 NOTE — Telephone Encounter (Signed)
Swelling has gone down.  Still has some soreness in chest and leg on rt side is more sore than left but he had hernia surgery on that side.  He says each day is getting better.  Feels like he has to take a deep breath when he walks to the rest room

## 2011-08-19 NOTE — Telephone Encounter (Signed)
Fax a return to work [phone] on [employer] with Dr Johney Frame  All of his symptoms are normal for the healing process.  He is okay to return to work without restrictions

## 2011-08-19 NOTE — Telephone Encounter (Signed)
FU Call: pt calling back to speak with Tresa Endo from surgery last week and some questions pt had regarding some conditions. Please return pt call to discuss further.

## 2011-08-22 ENCOUNTER — Ambulatory Visit (INDEPENDENT_AMBULATORY_CARE_PROVIDER_SITE_OTHER): Payer: BC Managed Care – PPO | Admitting: *Deleted

## 2011-08-22 DIAGNOSIS — I4891 Unspecified atrial fibrillation: Secondary | ICD-10-CM

## 2011-09-12 ENCOUNTER — Ambulatory Visit (INDEPENDENT_AMBULATORY_CARE_PROVIDER_SITE_OTHER): Payer: BC Managed Care – PPO | Admitting: Pharmacist

## 2011-09-12 DIAGNOSIS — I480 Paroxysmal atrial fibrillation: Secondary | ICD-10-CM

## 2011-09-12 DIAGNOSIS — I4891 Unspecified atrial fibrillation: Secondary | ICD-10-CM

## 2011-09-18 ENCOUNTER — Other Ambulatory Visit: Payer: Self-pay | Admitting: Internal Medicine

## 2011-09-23 ENCOUNTER — Other Ambulatory Visit: Payer: Self-pay | Admitting: Internal Medicine

## 2011-09-23 MED ORDER — FLECAINIDE ACETATE 100 MG PO TABS: 100.0000 mg | ORAL_TABLET | Freq: Two times a day (BID) | ORAL | Status: DC

## 2011-09-26 ENCOUNTER — Telehealth: Payer: Self-pay | Admitting: Internal Medicine

## 2011-09-26 ENCOUNTER — Other Ambulatory Visit: Payer: Self-pay | Admitting: *Deleted

## 2011-09-26 MED ORDER — FLECAINIDE ACETATE 100 MG PO TABS: ORAL_TABLET | ORAL | Status: DC

## 2011-09-26 NOTE — Telephone Encounter (Signed)
Pt needs to talk to you about flecinide dosing he has not been taking the correct dose and needs to make sure what he needs to do or if it will affect him in any way

## 2011-09-26 NOTE — Telephone Encounter (Signed)
Is to be on 1 1/2 tablets of Flecainide twice daliy  Patient aware

## 2011-09-29 ENCOUNTER — Other Ambulatory Visit: Payer: Self-pay | Admitting: Internal Medicine

## 2011-10-24 ENCOUNTER — Other Ambulatory Visit: Payer: Self-pay | Admitting: *Deleted

## 2011-10-24 ENCOUNTER — Ambulatory Visit (INDEPENDENT_AMBULATORY_CARE_PROVIDER_SITE_OTHER): Payer: BC Managed Care – PPO | Admitting: *Deleted

## 2011-10-24 DIAGNOSIS — I480 Paroxysmal atrial fibrillation: Secondary | ICD-10-CM

## 2011-10-24 DIAGNOSIS — I4891 Unspecified atrial fibrillation: Secondary | ICD-10-CM

## 2011-10-24 MED ORDER — FLECAINIDE ACETATE 100 MG PO TABS: ORAL_TABLET | ORAL | Status: DC

## 2011-11-07 ENCOUNTER — Ambulatory Visit (INDEPENDENT_AMBULATORY_CARE_PROVIDER_SITE_OTHER): Payer: BC Managed Care – PPO | Admitting: Internal Medicine

## 2011-11-07 ENCOUNTER — Encounter: Payer: Self-pay | Admitting: Internal Medicine

## 2011-11-07 VITALS — BP 130/72 | HR 63 | Resp 18 | Ht 73.0 in | Wt 201.4 lb

## 2011-11-07 DIAGNOSIS — I4892 Unspecified atrial flutter: Secondary | ICD-10-CM

## 2011-11-07 DIAGNOSIS — I10 Essential (primary) hypertension: Secondary | ICD-10-CM

## 2011-11-07 DIAGNOSIS — I4891 Unspecified atrial fibrillation: Secondary | ICD-10-CM

## 2011-11-07 DIAGNOSIS — I480 Paroxysmal atrial fibrillation: Secondary | ICD-10-CM

## 2011-11-07 NOTE — Assessment & Plan Note (Signed)
Making good recovery s/p ablation

## 2011-11-07 NOTE — Progress Notes (Signed)
PCP: [name], MD, MD Primary Cardiologist:  Dr Klein  [name] [name] is a 51 y.o. male who presents today for routine electrophysiology followup.  Since his recent ablation, the patient reports doing very well.  He denies procedure related complications.  He feels that he has been predominantly in sinus rhythm.  He has occasional postural dizziness and mild SOB.  Today, he denies symptoms of palpitations, chest pain,   lower extremity edema, presyncope, or syncope.  The patient is otherwise without complaint today.   Past Medical History  Diagnosis Date  . Abdominal pain, epigastric 01/01/2008  . Paroxysmal atrial fibrillation 12/15/2007  . CHEST PAIN-PRECORDIAL 05/25/2009  . ERECTILE DYSFUNCTION, ORGANIC 01/16/2010  . HYPERLIPIDEMIA 12/15/2007  . HYPERTENSION 12/15/2007  . HYPOKALEMIA 12/15/2007  . POSTURAL LIGHTHEADEDNESS 07/06/2008   Past Surgical History  Procedure Date  . Inguinal herniorrhapy 6/08  . Hernia repair   . Elbow surgery bone spur  . Tee without cardioversion 08/12/2011    Procedure: TRANSESOPHAGEAL ECHOCARDIOGRAM (TEE);  Surgeon: Paula V Ross, MD;  Location: [facility];  Service: Cardiovascular;  Laterality: N/A;  . Atrial fibrillation and atrial flutter ablation 08/13/11    PVI and CTI ablation by Dr Hassaan Crite    Current Outpatient Prescriptions  Medication Sig Dispense Refill  . amLODipine (NORVASC) 2.5 MG tablet Take 2.5 mg by mouth daily.      . atorvastatin (LIPITOR) 20 MG tablet Take 20 mg by mouth daily.      . atorvastatin (LIPITOR) 20 MG tablet TAKE 1 TABLET BY MOUTH EVERY DAY  30 tablet  5  . B Complex-C (B-COMPLEX WITH VITAMIN C) tablet Take 1 tablet by mouth daily.      . CIALIS 20 MG tablet TAKE 1 TABLET ONCE DAILY AS NEEDED  5 tablet  6  . doxazosin (CARDURA) 4 MG tablet Take 4 mg by mouth at bedtime.      . flecainide (TAMBOCOR) 100 MG tablet Take 1 1/2 tablets twice daily  270 tablet  3  . losartan (COZAAR) 100 MG tablet Take 100 mg by mouth daily.        . omega-3 acid ethyl esters (LOVAZA) 1 G capsule Take 1 g by mouth 2 (two) times daily.      . pantoprazole (PROTONIX) 40 MG tablet Take 1 tablet (40 mg total) by mouth daily.  30 tablet  6  . vardenafil (LEVITRA) 20 MG tablet Take 20 mg by mouth every other day.      . warfarin (COUMADIN) 7.5 MG tablet Take 7.5 mg by mouth daily.        Physical Exam: Filed Vitals:   11/07/11 1032  BP: 130/72  Pulse: 63  Resp: 18  Height: 6' 1" (1.854 m)  Weight: 201 lb 6.4 oz (91.354 kg)    GEN- The patient is well appearing, alert and oriented x 3 today.   Head- normocephalic, atraumatic Eyes-  Sclera clear, conjunctiva pink Ears- hearing intact Oropharynx- clear Lungs- Clear to ausculation bilaterally, normal work of breathing Heart- irregular rate and rhythm, no murmurs, rubs or gallops, PMI not laterally displaced GI- soft, NT, ND, + BS Extremities- no clubbing, cyanosis, or edema  ekg today reveals atrial flutter, V rate 63   Assessment and Plan:      

## 2011-11-07 NOTE — Patient Instructions (Signed)
Your physician recommends that you schedule a nurse visit with an EKG on Thursday, June 13.  Your physician recommends that you schedule a follow-up appointment in: 3 months with Dr. Johney Frame.

## 2011-11-07 NOTE — Assessment & Plan Note (Signed)
The patient is in atrial flutter today with minimal symptoms.  Though this could be recurrence of isthmus dependant atrial flutter, atypical atrial flutter is also quite plausible s/p ablation. He feels that he is mostly in sinus rhythm.  I will therefore have him return in 1 week.  IF he remains in atrial flutter, then we will proceed with cardioversion.  If he is in sinus, then we will continue to monitor. No medicine changes are made today.

## 2011-11-07 NOTE — Assessment & Plan Note (Signed)
Stable No change required today  

## 2011-11-12 NOTE — Progress Notes (Signed)
Addended by: Reine Just on: 11/12/2011 04:15 PM   Modules accepted: Orders

## 2011-11-13 ENCOUNTER — Ambulatory Visit (INDEPENDENT_AMBULATORY_CARE_PROVIDER_SITE_OTHER): Payer: BC Managed Care – PPO | Admitting: *Deleted

## 2011-11-13 ENCOUNTER — Encounter: Payer: Self-pay | Admitting: *Deleted

## 2011-11-13 DIAGNOSIS — Z01812 Encounter for preprocedural laboratory examination: Secondary | ICD-10-CM

## 2011-11-13 DIAGNOSIS — I4892 Unspecified atrial flutter: Secondary | ICD-10-CM

## 2011-11-13 LAB — CBC
HCT: 45.2 % (ref 39.0–52.0)
Hemoglobin: 14.9 g/dL (ref 13.0–17.0)
MCV: 90 fl (ref 78.0–100.0)
Platelets: 205 10*3/uL (ref 150.0–400.0)
RBC: 5.02 Mil/uL (ref 4.22–5.81)
WBC: 9.8 10*3/uL (ref 4.5–10.5)

## 2011-11-13 LAB — BASIC METABOLIC PANEL
CO2: 24 mEq/L (ref 19–32)
Chloride: 108 mEq/L (ref 96–112)
GFR: 88.46 mL/min (ref >60.00)
Glucose, Bld: 106 mg/dL — ABNORMAL HIGH (ref 70–99)
Potassium: 3.8 mEq/L (ref 3.5–5.1)
Sodium: 138 mEq/L (ref 135–145)

## 2011-11-13 LAB — MAGNESIUM: Magnesium: 2 mg/dL (ref 1.5–2.5)

## 2011-11-13 NOTE — Progress Notes (Signed)
Pt here for EKG, and after viewing by dr allred pt is scheduled for cardioversion

## 2011-11-20 ENCOUNTER — Encounter (HOSPITAL_COMMUNITY): Payer: Self-pay | Admitting: Pharmacy Technician

## 2011-11-21 ENCOUNTER — Ambulatory Visit (HOSPITAL_COMMUNITY): Payer: BC Managed Care – PPO | Admitting: Certified Registered"

## 2011-11-21 ENCOUNTER — Other Ambulatory Visit: Payer: Self-pay | Admitting: *Deleted

## 2011-11-21 ENCOUNTER — Ambulatory Visit (HOSPITAL_COMMUNITY)
Admission: RE | Admit: 2011-11-21 | Discharge: 2011-11-21 | Disposition: A | Discharge status: Home / Self Care | Payer: BC Managed Care – PPO | Source: Ambulatory Visit | Attending: Internal Medicine | Admitting: Internal Medicine

## 2011-11-21 ENCOUNTER — Encounter (HOSPITAL_COMMUNITY): Payer: Self-pay | Admitting: Certified Registered"

## 2011-11-21 ENCOUNTER — Encounter (HOSPITAL_COMMUNITY)
Admission: RE | Disposition: A | Discharge status: Home / Self Care | Payer: Self-pay | Source: Ambulatory Visit | Attending: Internal Medicine

## 2011-11-21 DIAGNOSIS — I4892 Unspecified atrial flutter: Secondary | ICD-10-CM

## 2011-11-21 DIAGNOSIS — I4891 Unspecified atrial fibrillation: Secondary | ICD-10-CM

## 2011-11-21 DIAGNOSIS — I10 Essential (primary) hypertension: Secondary | ICD-10-CM | POA: Insufficient documentation

## 2011-11-21 HISTORY — PX: CARDIOVERSION: SHX1299

## 2011-11-21 SURGERY — CARDIOVERSION
Anesthesia: Monitor Anesthesia Care | Wound class: Clean

## 2011-11-21 MED ORDER — PROPOFOL 10 MG/ML IV EMUL
INTRAVENOUS | Status: DC | PRN
  Administered 2011-11-21: 50 mg via INTRAVENOUS
  Administered 2011-11-21: 60 mg via INTRAVENOUS

## 2011-11-21 MED ORDER — SODIUM CHLORIDE 0.9 % IJ SOLN: 3.0000 mL | Freq: Two times a day (BID) | INTRAMUSCULAR | Status: DC

## 2011-11-21 MED ORDER — HYDROCORTISONE 1 % EX CREA
TOPICAL_CREAM | CUTANEOUS | Status: AC
  Filled 2011-11-21: qty 28

## 2011-11-21 MED ORDER — HYDROCORTISONE 1 % EX CREA: 1.0000 "application " | TOPICAL_CREAM | Freq: Three times a day (TID) | CUTANEOUS | Status: DC | PRN

## 2011-11-21 MED ORDER — SODIUM CHLORIDE 0.9 % IJ SOLN: 3.0000 mL | INTRAMUSCULAR | Status: DC | PRN

## 2011-11-21 MED ORDER — SODIUM CHLORIDE 0.9 % IV SOLN: 250.0000 mL | INTRAVENOUS | Status: DC

## 2011-11-21 MED ORDER — SODIUM CHLORIDE 0.9 % IV SOLN
INTRAVENOUS | Status: DC | PRN
  Administered 2011-11-21: 10:00:00 via INTRAVENOUS

## 2011-11-21 NOTE — Anesthesia Postprocedure Evaluation (Signed)
  Anesthesia Post-op Note  Patient: [name] [name]  Procedure(s) Performed: Procedure(s) (LRB): CARDIOVERSION (N/A)  Patient Location: Short Stay  Anesthesia Type: General  Level of Consciousness: awake, alert , oriented and patient cooperative  Airway and Oxygen Therapy: Patient Spontanous Breathing and Patient connected to nasal cannula oxygen  Post-op Pain: none  Post-op Assessment: Post-op Vital signs reviewed, Patient's Cardiovascular Status Stable, Respiratory Function Stable, Patent Airway, No signs of Nausea or vomiting and Pain level controlled  Post-op Vital Signs: stable  Complications: No apparent anesthesia complications

## 2011-11-21 NOTE — Transfer of Care (Signed)
Immediate Anesthesia Transfer of Care Note  Patient: [name] [name]  Procedure(s) Performed: Procedure(s) (LRB): CARDIOVERSION (N/A)  Patient Location: Short Stay  Anesthesia Type: MAC  Level of Consciousness: awake, alert  and oriented  Airway & Oxygen Therapy: Patient Spontanous Breathing and Patient connected to nasal cannula oxygen  Post-op Assessment: Report given to PACU RN, Post -op Vital signs reviewed and stable and Patient moving all extremities  Post vital signs: Reviewed and stable  Complications: No apparent anesthesia complications

## 2011-11-21 NOTE — Preoperative (Signed)
Beta Blockers   Reason not to administer Beta Blockers:Not Applicable 

## 2011-11-21 NOTE — Progress Notes (Signed)
1240  Cardioversion discharge instructions given to patient.  Appointment made for patient to see Dr Johney Frame Monday December 09, 2011  At 1235.  Patient instructed to continue current medications and med discharge instruction sheet given to patient.  Both spouse and patient voice understanding of instructions.

## 2011-11-21 NOTE — H&P (View-Only) (Signed)
PCP: [name] Barre, MD, MD Primary Cardiologist:  [name] [name] is a 51 y.o. male who presents today for routine electrophysiology followup.  Since his recent ablation, the patient reports doing very well.  He denies procedure related complications.  He feels that he has been predominantly in sinus rhythm.  He has occasional postural dizziness and mild SOB.  Today, he denies symptoms of palpitations, chest pain,   lower extremity edema, presyncope, or syncope.  The patient is otherwise without complaint today.   Past Medical History  Diagnosis Date  . Abdominal pain, epigastric 01/01/2008  . Paroxysmal atrial fibrillation 12/15/2007  . CHEST PAIN-PRECORDIAL 05/25/2009  . ERECTILE DYSFUNCTION, ORGANIC 01/16/2010  . HYPERLIPIDEMIA 12/15/2007  . HYPERTENSION 12/15/2007  . HYPOKALEMIA 12/15/2007  . POSTURAL LIGHTHEADEDNESS 07/06/2008   Past Surgical History  Procedure Date  . Inguinal herniorrhapy 6/08  . Hernia repair   . Elbow surgery bone spur  . Tee without cardioversion 08/12/2011    Procedure: TRANSESOPHAGEAL ECHOCARDIOGRAM (TEE);  Surgeon: [name] Riffle, MD;  Location: [facility] ENDOSCOPY;  Service: Cardiovascular;  Laterality: N/A;  . Atrial fibrillation and atrial flutter ablation 08/13/11    PVI and CTI ablation by [name] [name] [name]    Current Outpatient Prescriptions  Medication Sig Dispense Refill  . amLODipine (NORVASC) 2.5 MG tablet Take 2.5 mg by mouth daily.      Marland Kitchen atorvastatin (LIPITOR) 20 MG tablet Take 20 mg by mouth daily.      Marland Kitchen atorvastatin (LIPITOR) 20 MG tablet TAKE 1 TABLET BY MOUTH EVERY DAY  30 tablet  5  . B Complex-C (B-COMPLEX WITH VITAMIN C) tablet Take 1 tablet by mouth daily.      Marland Kitchen CIALIS 20 MG tablet TAKE 1 TABLET ONCE DAILY AS NEEDED  5 tablet  6  . doxazosin (CARDURA) 4 MG tablet Take 4 mg by mouth at bedtime.      . flecainide (TAMBOCOR) 100 MG tablet Take 1 1/2 tablets twice daily  270 tablet  3  . losartan (COZAAR) 100 MG tablet Take 100 mg by mouth daily.        Marland Kitchen omega-3 acid ethyl esters (LOVAZA) 1 G capsule Take 1 g by mouth 2 (two) times daily.      . pantoprazole (PROTONIX) 40 MG tablet Take 1 tablet (40 mg total) by mouth daily.  30 tablet  6  . vardenafil (LEVITRA) 20 MG tablet Take 20 mg by mouth every other day.      . warfarin (COUMADIN) 7.5 MG tablet Take 7.5 mg by mouth daily.        Physical Exam: Filed Vitals:   11/07/11 1032  BP: 130/72  Pulse: 63  Resp: 18  Height: 6\' 1"  (1.854 m)  Weight: 201 lb 6.4 oz (91.354 kg)    GEN- The patient is well appearing, alert and oriented x 3 today.   Head- normocephalic, atraumatic Eyes-  Sclera clear, conjunctiva pink Ears- hearing intact Oropharynx- clear Lungs- Clear to ausculation bilaterally, normal work of breathing Heart- irregular rate and rhythm, no murmurs, rubs or gallops, PMI not laterally displaced GI- soft, NT, ND, + BS Extremities- no clubbing, cyanosis, or edema  ekg today reveals atrial flutter, V rate 63   Assessment and Plan:

## 2011-11-21 NOTE — Anesthesia Postprocedure Evaluation (Signed)
  Anesthesia Post-op Note  Patient: [name] [name]  Procedure(s) Performed: Procedure(s) (LRB): CARDIOVERSION (N/A)  Patient Location: Short Stay  Anesthesia Type: MAC  Level of Consciousness: awake, alert  and oriented  Airway and Oxygen Therapy: Patient Spontanous Breathing and Patient connected to nasal cannula oxygen  Post-op Pain: none  Post-op Assessment: Post-op Vital signs reviewed, Patient's Cardiovascular Status Stable, Respiratory Function Stable, Patent Airway, No signs of Nausea or vomiting and Pain level controlled  Post-op Vital Signs: Reviewed and stable  Complications: No apparent anesthesia complications

## 2011-11-21 NOTE — Progress Notes (Signed)
1130 awake and alert, following commands.  O2 dc'd, taking po's.  Spouse at bedside.

## 2011-11-21 NOTE — Interval H&P Note (Signed)
History and Physical Interval Note:  11/21/2011 11:26 AM  [name] [name]  has presented today for surgery, with the diagnosis of a-flutter  The various methods of treatment have been discussed with the patient and family. After consideration of risks, benefits and other options for treatment, the patient has consented to  Procedure(s) (LRB): CARDIOVERSION (N/A) as a surgical intervention .  The patient's history has been reviewed, patient examined, no change in status, stable for surgery.  I have reviewed the patients' chart and labs.  Questions were answered to the patient's satisfaction.     Kord Monette [employer]

## 2011-11-21 NOTE — Procedures (Addendum)
Electrical Cardioversion Procedure Note JARID SASSO [id number] [birth year]  Procedure: Electrical Cardioversion Indications:  Atrial Flutter (atypical).  INR has been therapeutic for > 1 month.  INR 2.47 today.   Procedure Details Consent: Risks of procedure as well as the alternatives and risks of each were explained to the (patient/caregiver).  Consent for procedure obtained. Time Out: Verified patient identification, verified procedure, site/side was marked, verified correct patient position, special equipment/implants available, medications/allergies/relevent history reviewed, required imaging and test results available.  Performed  Patient placed on cardiac monitor, pulse oximetry, supplemental oxygen as necessary.  Sedation given: Propofol Pacer pads placed anterior and posterior chest.  Cardioverted 2 time(s).  Cardioverted at 200J, sternal pressure used with 2nd attempt.   Evaluation Findings: Post procedure EKG shows: NSR Complications: None Patient did tolerate procedure well.   Marca Ancona 11/21/2011, 11:27 AM

## 2011-11-21 NOTE — Anesthesia Preprocedure Evaluation (Addendum)
Anesthesia Evaluation  Patient identified by MRN, date of birth, ID band Patient awake    Reviewed: Allergy & Precautions, H&P , NPO status , Patient's Chart, lab work & pertinent test results, reviewed documented beta blocker date and time   Airway Mallampati: I TM Distance: >3 FB Neck ROM: full    Dental   Pulmonary          Cardiovascular hypertension, Pt. on medications + dysrhythmias Atrial Fibrillation Rhythm:irregular Rate:Normal     Neuro/Psych PSYCHIATRIC DISORDERS    GI/Hepatic   Endo/Other    Renal/GU      Musculoskeletal   Abdominal   Peds  Hematology   Anesthesia Other Findings   Reproductive/Obstetrics                          Anesthesia Physical Anesthesia Plan  ASA: III  Anesthesia Plan: General   Post-op Pain Management:    Induction: Intravenous  Airway Management Planned: Mask  Additional Equipment:   Intra-op Plan:   Post-operative Plan:   Informed Consent: I have reviewed the patients History and Physical, chart, labs and discussed the procedure including the risks, benefits and alternatives for the proposed anesthesia with the patient or authorized representative who has indicated his/her understanding and acceptance.     Plan Discussed with: CRNA, Anesthesiologist and Surgeon  Anesthesia Plan Comments:         Anesthesia Quick Evaluation

## 2011-11-22 ENCOUNTER — Encounter (HOSPITAL_COMMUNITY): Payer: Self-pay | Admitting: Cardiology

## 2011-12-02 ENCOUNTER — Ambulatory Visit (INDEPENDENT_AMBULATORY_CARE_PROVIDER_SITE_OTHER): Payer: BC Managed Care – PPO | Admitting: *Deleted

## 2011-12-02 ENCOUNTER — Encounter: Payer: Self-pay | Admitting: Internal Medicine

## 2011-12-02 ENCOUNTER — Ambulatory Visit (INDEPENDENT_AMBULATORY_CARE_PROVIDER_SITE_OTHER): Payer: BC Managed Care – PPO | Admitting: Internal Medicine

## 2011-12-02 VITALS — BP 118/82 | HR 100 | Ht 72.0 in | Wt 193.0 lb

## 2011-12-02 DIAGNOSIS — I480 Paroxysmal atrial fibrillation: Secondary | ICD-10-CM

## 2011-12-02 DIAGNOSIS — I4892 Unspecified atrial flutter: Secondary | ICD-10-CM

## 2011-12-02 DIAGNOSIS — I10 Essential (primary) hypertension: Secondary | ICD-10-CM

## 2011-12-02 DIAGNOSIS — IMO0002 Reserved for concepts with insufficient information to code with codable children: Secondary | ICD-10-CM

## 2011-12-02 DIAGNOSIS — I4891 Unspecified atrial fibrillation: Secondary | ICD-10-CM

## 2011-12-02 LAB — POCT INR: INR: 3.7

## 2011-12-02 MED ORDER — DILTIAZEM HCL ER COATED BEADS 180 MG PO CP24: 180.0000 mg | ORAL_CAPSULE | Freq: Every day | ORAL | Status: DC

## 2011-12-02 NOTE — Progress Notes (Signed)
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  Since his recent ablation, the patient reports doing reasonably well.  He felt "much better" after cardioversion.  Unfortunately he has subsequently developed progressive fatigue and SOB with moderate activity.  EKG today reveals that he is back in atrial flutter.  He has occasional postural dizziness and mild SOB.  Today, he denies symptoms of palpitations, chest pain,   lower extremity edema, presyncope, or syncope.  The patient is otherwise without complaint today.   Past Medical History  Diagnosis Date  . Abdominal pain, epigastric 01/01/2008  . Paroxysmal atrial fibrillation 12/15/2007  . CHEST PAIN-PRECORDIAL 05/25/2009  . ERECTILE DYSFUNCTION, ORGANIC 01/16/2010  . HYPERLIPIDEMIA 12/15/2007  . HYPERTENSION 12/15/2007  . HYPOKALEMIA 12/15/2007  . POSTURAL LIGHTHEADEDNESS 07/06/2008   Past Surgical History  Procedure Date  . Inguinal herniorrhapy 6/08  . Hernia repair   . Elbow surgery bone spur  . Tee without cardioversion 08/12/2011    Procedure: TRANSESOPHAGEAL ECHOCARDIOGRAM (TEE);  Surgeon: Pricilla Riffle, MD;  Location: [facility] ENDOSCOPY;  Service: Cardiovascular;  Laterality: N/A;  . Atrial fibrillation and atrial flutter ablation 08/13/11    PVI and CTI ablation by Dr Johney Frame  . Cardioversion 11/21/2011    Procedure: CARDIOVERSION;  Surgeon: Laurey Morale, MD;  Location: [facility] OR;  Service: Cardiovascular;  Laterality: N/A;    Current Outpatient Prescriptions  Medication Sig Dispense Refill  . amLODipine (NORVASC) 2.5 MG tablet Take 2.5 mg by mouth daily.      Marland Kitchen atorvastatin (LIPITOR) 20 MG tablet Take 20 mg by mouth daily.      . B Complex-C (B-COMPLEX WITH VITAMIN C) tablet Take 1 tablet by mouth daily.      Marland Kitchen doxazosin (CARDURA) 4 MG tablet Take 4 mg by mouth at bedtime.      Marland Kitchen losartan (COZAAR) 100 MG tablet Take 100 mg by mouth daily.      Marland Kitchen  omega-3 acid ethyl esters (LOVAZA) 1 G capsule Take 1 g by mouth daily.       . pantoprazole (PROTONIX) 40 MG tablet Take 1 tablet (40 mg total) by mouth daily.  30 tablet  6  . tadalafil (CIALIS) 20 MG tablet Take 20 mg by mouth daily as needed. For erectile dysfunction      . warfarin (COUMADIN) 7.5 MG tablet Take 7.5 mg by mouth daily.      Marland Kitchen diltiazem (CARDIZEM CD) 180 MG 24 hr capsule Take 1 capsule (180 mg total) by mouth daily.  90 capsule  3    Physical Exam: Filed Vitals:   12/02/11 1431  BP: 118/82  Pulse: 100  Height: 6' (1.829 m)  Weight: 193 lb (87.544 kg)    GEN- The patient is well appearing, alert and oriented x 3 today.   Head- normocephalic, atraumatic Eyes-  Sclera clear, conjunctiva pink Ears- hearing intact Oropharynx- clear Lungs- Clear to ausculation bilaterally, normal work of breathing Heart- irregular rate and rhythm, no murmurs, rubs or gallops, PMI not laterally displaced GI- soft, NT, ND, + BS Extremities- no clubbing, cyanosis, or edema  ekg today reveals atrial flutter, V rate 100 bpm (2:1 AV conduction)  Assessment and Plan:

## 2011-12-02 NOTE — Patient Instructions (Addendum)
Your physician has recommended you make the following change in your medication: Stop your flecainide and Start diltiazem 180mg  daily  Please have your inrs checked weekly for an ablation  Call Dr Jenel Lucks nurse, Tresa Endo, when you are ready to schedule your ablation at [phone].

## 2011-12-02 NOTE — Assessment & Plan Note (Signed)
As above.

## 2011-12-02 NOTE — Assessment & Plan Note (Signed)
Stable No change required today  

## 2011-12-02 NOTE — Assessment & Plan Note (Signed)
Mr Bordas has developed recurrent flutter s/p ablation.  EKG suggests typical appearance.  This may be recurrent of isthmus dependant right atrial flutter vs a new atrial flutter. Therapeutic strategies for atrial flutter including medicine and ablation were discussed in detail with the patient today. Risk, benefits, and alternatives to EP study and radiofrequency ablation were also discussed in detail today. These risks include but are not limited to stroke, bleeding, vascular damage, tamponade, perforation, damage to the heart and other structures, AV block requiring pacemaker, worsening renal function, and death. The patient understands these risk and wishes to proceed.  He will check with his work to see when he can proceed and contact our office. In the interim, we will begin weekly INRs.  I would anticipate using carto to map the tachycardia prior to ablation in case this is an atypical flutter. Stop flecainide and start diltiazem 180mg  daily.

## 2011-12-09 ENCOUNTER — Encounter: Payer: BC Managed Care – PPO | Admitting: Internal Medicine

## 2011-12-09 ENCOUNTER — Ambulatory Visit (INDEPENDENT_AMBULATORY_CARE_PROVIDER_SITE_OTHER): Payer: BC Managed Care – PPO | Admitting: *Deleted

## 2011-12-09 DIAGNOSIS — I480 Paroxysmal atrial fibrillation: Secondary | ICD-10-CM

## 2011-12-09 DIAGNOSIS — I4891 Unspecified atrial fibrillation: Secondary | ICD-10-CM

## 2011-12-13 NOTE — Addendum Note (Signed)
Addended by: Lacie Scotts on: 12/13/2011 02:52 PM   Modules accepted: Orders

## 2011-12-16 ENCOUNTER — Ambulatory Visit (INDEPENDENT_AMBULATORY_CARE_PROVIDER_SITE_OTHER): Payer: BC Managed Care – PPO

## 2011-12-16 DIAGNOSIS — I480 Paroxysmal atrial fibrillation: Secondary | ICD-10-CM

## 2011-12-16 DIAGNOSIS — I4891 Unspecified atrial fibrillation: Secondary | ICD-10-CM

## 2011-12-16 LAB — POCT INR: INR: 4.1

## 2011-12-19 ENCOUNTER — Other Ambulatory Visit: Payer: Self-pay | Admitting: *Deleted

## 2011-12-19 ENCOUNTER — Encounter: Payer: Self-pay | Admitting: *Deleted

## 2011-12-19 DIAGNOSIS — I4891 Unspecified atrial fibrillation: Secondary | ICD-10-CM

## 2011-12-19 DIAGNOSIS — I4892 Unspecified atrial flutter: Secondary | ICD-10-CM

## 2011-12-23 ENCOUNTER — Other Ambulatory Visit (INDEPENDENT_AMBULATORY_CARE_PROVIDER_SITE_OTHER): Payer: BC Managed Care – PPO

## 2011-12-23 ENCOUNTER — Ambulatory Visit (INDEPENDENT_AMBULATORY_CARE_PROVIDER_SITE_OTHER): Payer: BC Managed Care – PPO

## 2011-12-23 DIAGNOSIS — I4891 Unspecified atrial fibrillation: Secondary | ICD-10-CM

## 2011-12-23 DIAGNOSIS — I4892 Unspecified atrial flutter: Secondary | ICD-10-CM

## 2011-12-23 DIAGNOSIS — I480 Paroxysmal atrial fibrillation: Secondary | ICD-10-CM

## 2011-12-23 LAB — BASIC METABOLIC PANEL
BUN: 19 mg/dL (ref 6–23)
CO2: 24 mEq/L (ref 19–32)
Calcium: 9 mg/dL (ref 8.4–10.5)
Creatinine, Ser: 1 mg/dL (ref 0.4–1.5)
GFR: 97.39 mL/min (ref >60.00)
Glucose, Bld: 119 mg/dL — ABNORMAL HIGH (ref 70–99)
Sodium: 139 mEq/L (ref 135–145)

## 2011-12-23 LAB — CBC WITH DIFFERENTIAL/PLATELET
Basophils Relative: 0.3 % (ref 0.0–3.0)
Eosinophils Relative: 2.6 % (ref 0.0–5.0)
HCT: 40.5 % (ref 39.0–52.0)
Hemoglobin: 13.4 g/dL (ref 13.0–17.0)
Lymphs Abs: 2 10*3/uL (ref 0.7–4.0)
MCV: 90 fl (ref 78.0–100.0)
Monocytes Absolute: 0.7 10*3/uL (ref 0.1–1.0)
Monocytes Relative: 9.4 % (ref 3.0–12.0)
Neutro Abs: 4.9 10*3/uL (ref 1.4–7.7)
WBC: 7.9 10*3/uL (ref 4.5–10.5)

## 2011-12-23 LAB — POCT INR: INR: 3.5

## 2011-12-24 ENCOUNTER — Encounter (HOSPITAL_COMMUNITY): Payer: Self-pay | Admitting: Pharmacy Technician

## 2011-12-27 ENCOUNTER — Encounter (HOSPITAL_COMMUNITY): Payer: Self-pay | Admitting: Anesthesiology

## 2011-12-27 ENCOUNTER — Encounter (HOSPITAL_COMMUNITY)
Admission: RE | Disposition: A | Discharge status: Home / Self Care | Payer: Self-pay | Source: Ambulatory Visit | Attending: Internal Medicine

## 2011-12-27 ENCOUNTER — Ambulatory Visit (HOSPITAL_COMMUNITY)
Admission: RE | Admit: 2011-12-27 | Discharge: 2011-12-28 | Disposition: A | Discharge status: Home / Self Care | Payer: BC Managed Care – PPO | Source: Ambulatory Visit | Attending: Internal Medicine | Admitting: Internal Medicine

## 2011-12-27 ENCOUNTER — Ambulatory Visit (HOSPITAL_COMMUNITY): Payer: BC Managed Care – PPO | Admitting: Anesthesiology

## 2011-12-27 ENCOUNTER — Encounter (HOSPITAL_COMMUNITY): Payer: Self-pay | Admitting: General Practice

## 2011-12-27 DIAGNOSIS — K219 Gastro-esophageal reflux disease without esophagitis: Secondary | ICD-10-CM | POA: Insufficient documentation

## 2011-12-27 DIAGNOSIS — I4891 Unspecified atrial fibrillation: Secondary | ICD-10-CM

## 2011-12-27 DIAGNOSIS — G4730 Sleep apnea, unspecified: Secondary | ICD-10-CM | POA: Insufficient documentation

## 2011-12-27 DIAGNOSIS — E785 Hyperlipidemia, unspecified: Secondary | ICD-10-CM | POA: Diagnosis present

## 2011-12-27 DIAGNOSIS — I4819 Other persistent atrial fibrillation: Secondary | ICD-10-CM | POA: Diagnosis present

## 2011-12-27 DIAGNOSIS — I4892 Unspecified atrial flutter: Secondary | ICD-10-CM | POA: Insufficient documentation

## 2011-12-27 DIAGNOSIS — F17200 Nicotine dependence, unspecified, uncomplicated: Secondary | ICD-10-CM | POA: Insufficient documentation

## 2011-12-27 DIAGNOSIS — I10 Essential (primary) hypertension: Secondary | ICD-10-CM | POA: Diagnosis present

## 2011-12-27 HISTORY — PX: ATRIAL FLUTTER ABLATION: SHX5733

## 2011-12-27 SURGERY — ATRIAL FLUTTER ABLATION
Anesthesia: Monitor Anesthesia Care

## 2011-12-27 MED ORDER — DOXAZOSIN MESYLATE 4 MG PO TABS
4.0000 mg | ORAL_TABLET | Freq: Every day | ORAL | Status: DC
Start: 2011-12-27 — End: 2011-12-28
  Administered 2011-12-27: 4 mg via ORAL
  Filled 2011-12-27 (×2): qty 1

## 2011-12-27 MED ORDER — ONDANSETRON HCL 4 MG/2ML IJ SOLN
INTRAMUSCULAR | Status: DC | PRN
  Administered 2011-12-27: 4 mg via INTRAVENOUS

## 2011-12-27 MED ORDER — SUFENTANIL CITRATE 50 MCG/ML IV SOLN
INTRAVENOUS | Status: DC | PRN
  Administered 2011-12-27: 10 ug via INTRAVENOUS

## 2011-12-27 MED ORDER — ACETAMINOPHEN 325 MG PO TABS: 650.0000 mg | ORAL_TABLET | ORAL | Status: DC | PRN

## 2011-12-27 MED ORDER — PROPOFOL 10 MG/ML IV EMUL
INTRAVENOUS | Status: DC | PRN
  Administered 2011-12-27: 50 ug/kg/min via INTRAVENOUS

## 2011-12-27 MED ORDER — PANTOPRAZOLE SODIUM 40 MG PO TBEC
40.0000 mg | DELAYED_RELEASE_TABLET | Freq: Every day | ORAL | Status: DC
  Administered 2011-12-27: 21:00:00 40 mg via ORAL
  Filled 2011-12-27: qty 1

## 2011-12-27 MED ORDER — AMLODIPINE BESYLATE 2.5 MG PO TABS
2.5000 mg | ORAL_TABLET | Freq: Every day | ORAL | Status: DC
  Administered 2011-12-27: 2.5 mg via ORAL
  Filled 2011-12-27 (×2): qty 1

## 2011-12-27 MED ORDER — HYDROCODONE-ACETAMINOPHEN 5-325 MG PO TABS
1.0000 | ORAL_TABLET | ORAL | Status: DC | PRN
  Administered 2011-12-27: 17:00:00 1 via ORAL
  Administered 2011-12-28: 2 via ORAL
  Filled 2011-12-27: qty 2
  Filled 2011-12-27: qty 1

## 2011-12-27 MED ORDER — WARFARIN - PHYSICIAN DOSING INPATIENT: Freq: Every day | Status: DC

## 2011-12-27 MED ORDER — SODIUM CHLORIDE 0.9 % IJ SOLN: 3.0000 mL | Freq: Two times a day (BID) | INTRAMUSCULAR | Status: DC

## 2011-12-27 MED ORDER — SODIUM CHLORIDE 0.9 % IJ SOLN: 3.0000 mL | INTRAMUSCULAR | Status: DC | PRN

## 2011-12-27 MED ORDER — ONDANSETRON HCL 4 MG/2ML IJ SOLN: 4.0000 mg | Freq: Four times a day (QID) | INTRAMUSCULAR | Status: DC | PRN

## 2011-12-27 MED ORDER — ATORVASTATIN CALCIUM 20 MG PO TABS
20.0000 mg | ORAL_TABLET | Freq: Every day | ORAL | Status: DC
  Administered 2011-12-27: 21:00:00 20 mg via ORAL
  Filled 2011-12-27 (×2): qty 1

## 2011-12-27 MED ORDER — LOSARTAN POTASSIUM 50 MG PO TABS
100.0000 mg | ORAL_TABLET | Freq: Every day | ORAL | Status: DC
  Administered 2011-12-27: 21:00:00 100 mg via ORAL
  Filled 2011-12-27 (×2): qty 2

## 2011-12-27 MED ORDER — SODIUM CHLORIDE 0.9 % IV SOLN: 250.0000 mL | INTRAVENOUS | Status: DC | PRN

## 2011-12-27 MED ORDER — BUPIVACAINE HCL (PF) 0.25 % IJ SOLN
INTRAMUSCULAR | Status: AC
  Filled 2011-12-27: qty 60

## 2011-12-27 MED ORDER — MIDAZOLAM HCL 5 MG/5ML IJ SOLN
INTRAMUSCULAR | Status: DC | PRN
  Administered 2011-12-27: 2 mg via INTRAVENOUS

## 2011-12-27 MED ORDER — WARFARIN SODIUM 7.5 MG PO TABS
7.5000 mg | ORAL_TABLET | Freq: Every day | ORAL | Status: DC
  Administered 2011-12-27: 18:00:00 7.5 mg via ORAL
  Filled 2011-12-27 (×2): qty 1

## 2011-12-27 MED ORDER — LOSARTAN POTASSIUM 50 MG PO TABS: 100.0000 mg | ORAL_TABLET | Freq: Every day | ORAL | Status: DC

## 2011-12-27 MED ORDER — SODIUM CHLORIDE 0.9 % IV SOLN
INTRAVENOUS | Status: DC | PRN
  Administered 2011-12-27: 11:00:00 via INTRAVENOUS

## 2011-12-27 NOTE — H&P (View-Only) (Signed)
PCP: Jackye Dever John, MD Primary Cardiologist:  Dr Klein  [name] [name] is a 51 y.o. male who presents today for electrophysiology followup.  Since his recent ablation, the patient reports doing reasonably well.  He felt "much better" after cardioversion.  Unfortunately he has subsequently developed progressive fatigue and SOB with moderate activity.  EKG today reveals that he is back in atrial flutter.  He has occasional postural dizziness and mild SOB.  Today, he denies symptoms of palpitations, chest pain,   lower extremity edema, presyncope, or syncope.  The patient is otherwise without complaint today.   Past Medical History  Diagnosis Date  . Abdominal pain, epigastric 01/01/2008  . Paroxysmal atrial fibrillation 12/15/2007  . CHEST PAIN-PRECORDIAL 05/25/2009  . ERECTILE DYSFUNCTION, ORGANIC 01/16/2010  . HYPERLIPIDEMIA 12/15/2007  . HYPERTENSION 12/15/2007  . HYPOKALEMIA 12/15/2007  . POSTURAL LIGHTHEADEDNESS 07/06/2008   Past Surgical History  Procedure Date  . Inguinal herniorrhapy 6/08  . Hernia repair   . Elbow surgery bone spur  . Tee without cardioversion 08/12/2011    Procedure: TRANSESOPHAGEAL ECHOCARDIOGRAM (TEE);  Surgeon: Paula V Ross, MD;  Location: [facility] ENDOSCOPY;  Service: Cardiovascular;  Laterality: N/A;  . Atrial fibrillation and atrial flutter ablation 08/13/11    PVI and CTI ablation by Dr Quanah Majka  . Cardioversion 11/21/2011    Procedure: CARDIOVERSION;  Surgeon: Dalton S McLean, MD;  Location: [facility] OR;  Service: Cardiovascular;  Laterality: N/A;    Current Outpatient Prescriptions  Medication Sig Dispense Refill  . amLODipine (NORVASC) 2.5 MG tablet Take 2.5 mg by mouth daily.      . atorvastatin (LIPITOR) 20 MG tablet Take 20 mg by mouth daily.      . B Complex-C (B-COMPLEX WITH VITAMIN C) tablet Take 1 tablet by mouth daily.      . doxazosin (CARDURA) 4 MG tablet Take 4 mg by mouth at bedtime.      . losartan (COZAAR) 100 MG tablet Take 100 mg by mouth daily.      .  omega-3 acid ethyl esters (LOVAZA) 1 G capsule Take 1 g by mouth daily.       . pantoprazole (PROTONIX) 40 MG tablet Take 1 tablet (40 mg total) by mouth daily.  30 tablet  6  . tadalafil (CIALIS) 20 MG tablet Take 20 mg by mouth daily as needed. For erectile dysfunction      . warfarin (COUMADIN) 7.5 MG tablet Take 7.5 mg by mouth daily.      . diltiazem (CARDIZEM CD) 180 MG 24 hr capsule Take 1 capsule (180 mg total) by mouth daily.  90 capsule  3    Physical Exam: Filed Vitals:   12/02/11 1431  BP: 118/82  Pulse: 100  Height: 6' (1.829 m)  Weight: 193 lb (87.544 kg)    GEN- The patient is well appearing, alert and oriented x 3 today.   Head- normocephalic, atraumatic Eyes-  Sclera clear, conjunctiva pink Ears- hearing intact Oropharynx- clear Lungs- Clear to ausculation bilaterally, normal work of breathing Heart- irregular rate and rhythm, no murmurs, rubs or gallops, PMI not laterally displaced GI- soft, NT, ND, + BS Extremities- no clubbing, cyanosis, or edema  ekg today reveals atrial flutter, V rate 100 bpm (2:1 AV conduction)  Assessment and Plan:      

## 2011-12-27 NOTE — Anesthesia Preprocedure Evaluation (Addendum)
Anesthesia Evaluation  Patient identified by MRN, date of birth, ID band Patient awake    Reviewed: Allergy & Precautions, H&P , NPO status , Patient's Chart, lab work & pertinent test results  History of Anesthesia Complications Negative for: history of anesthetic complications  Airway Mallampati: I TM Distance: >3 FB Neck ROM: Full    Dental No notable dental hx. (+) Teeth Intact and Dental Advisory Given   Pulmonary sleep apnea , Current Smoker,  Occasional smoker, about 2 or 3 times weekly     Sleep study: mild case of OSA, no CPAP recommended breath sounds clear to auscultation  Pulmonary exam normal       Cardiovascular hypertension, Pt. on medications + dysrhythmias (TEE '13: normal LVF, normal valves) Atrial Fibrillation Rhythm:Irregular Rate:Normal     Neuro/Psych negative neurological ROS  negative psych ROS   GI/Hepatic Neg liver ROS, GERD-  Medicated and Controlled,  Endo/Other  negative endocrine ROS  Renal/GU negative Renal ROS     Musculoskeletal   Abdominal   Peds  Hematology negative hematology ROS (+)   Anesthesia Other Findings   Reproductive/Obstetrics                          Anesthesia Physical Anesthesia Plan  ASA: III  Anesthesia Plan: MAC and General   Post-op Pain Management:    Induction: Intravenous  Airway Management Planned: Mask  Additional Equipment: Arterial line  Intra-op Plan:   Post-operative Plan:   Informed Consent: I have reviewed the patients History and Physical, chart, labs and discussed the procedure including the risks, benefits and alternatives for the proposed anesthesia with the patient or authorized representative who has indicated his/her understanding and acceptance.   Dental advisory given  Plan Discussed with: CRNA, Anesthesiologist and Surgeon  Anesthesia Plan Comments: (Plan routine monitors, MAC with GA if cardioversion  required  )       Anesthesia Quick Evaluation

## 2011-12-27 NOTE — Brief Op Note (Signed)
12/27/2011  1:03 PM  PATIENT:  [name] [name]  51 y.o. male  PRE-OPERATIVE DIAGNOSIS:  Atrial flutter  POST-OPERATIVE DIAGNOSIS:  Atrial flutter  PROCEDURE:  Procedure(s) (LRB): ATRIAL FLUTTER ABLATION (N/A)  SURGEON:  Surgeon(s) and Role:    * Hillis Range, MD - Primary  PHYSICIAN ASSISTANT:   ASSISTANTS: none   ANESTHESIA:   IV sedation  EBL:  Total I/O In: 500 [I.V.:500] Out: -   BLOOD ADMINISTERED:none  DRAINS: none   LOCAL MEDICATIONS USED:  NONE  SPECIMEN:  No Specimen  DISPOSITION OF SPECIMEN:  N/A  COUNTS:  YES  TOURNIQUET:  * No tourniquets in log *  DICTATION: . Dictation number [id number]  PLAN OF CARE: Admit for overnight observation  PATIENT DISPOSITION:  PACU - hemodynamically stable.   Delay start of Pharmacological VTE agent (>24hrs) due to surgical blood loss or risk of bleeding: not applicable

## 2011-12-27 NOTE — Transfer of Care (Signed)
Immediate Anesthesia Transfer of Care Note  Patient: [name] [name]  Procedure(s) Performed: Procedure(s) (LRB): ATRIAL FLUTTER ABLATION (N/A)  Patient Location: PACU  Anesthesia Type: General  Level of Consciousness: awake and alert   Airway & Oxygen Therapy: Patient Spontanous Breathing and Patient connected to nasal cannula oxygen  Post-op Assessment: Report given to PACU RN, Post -op Vital signs reviewed and stable, Patient moving all extremities and Patient moving all extremities X 4  Post vital signs: Reviewed and stable  Complications: No apparent anesthesia complications

## 2011-12-27 NOTE — Interval H&P Note (Signed)
History and Physical Interval Note:  12/27/2011 10:50 AM  [name] [name]  has presented today for surgery, with the diagnosis of Flutter  The various methods of treatment have been discussed with the patient and family. After consideration of risks, benefits and other options for treatment, the patient has consented to  Procedure(s) (LRB): ATRIAL FLUTTER ABLATION (N/A) as a surgical intervention .  The patient's history has been reviewed, patient examined, no change in status, stable for surgery.  I have reviewed the patient's chart and labs.  Questions were answered to the patient's satisfaction.     Hillis Range

## 2011-12-27 NOTE — Anesthesia Postprocedure Evaluation (Signed)
  Anesthesia Post-op Note  Patient: [name] [name]  Procedure(s) Performed: Procedure(s) (LRB): ATRIAL FLUTTER ABLATION (N/A)  Patient Location: PACU and Nursing Unit  Anesthesia Type: MAC  Level of Consciousness: awake, alert  and oriented  Airway and Oxygen Therapy: Patient Spontanous Breathing and Patient connected to nasal cannula oxygen  Post-op Pain: none  Post-op Assessment: Post-op Vital signs reviewed, Patient's Cardiovascular Status Stable, Respiratory Function Stable, Patent Airway, No signs of Nausea or vomiting and Pain level controlled  Post-op Vital Signs: Reviewed and stable  Complications: No apparent anesthesia complications

## 2011-12-28 DIAGNOSIS — I4892 Unspecified atrial flutter: Secondary | ICD-10-CM

## 2011-12-28 LAB — PROTIME-INR
INR: 2.71 — ABNORMAL HIGH (ref 0.00–1.49)
Prothrombin Time: 29.2 seconds — ABNORMAL HIGH (ref 11.6–15.2)

## 2011-12-28 MED ORDER — FLECAINIDE ACETATE 100 MG PO TABS: 100.0000 mg | ORAL_TABLET | Freq: Two times a day (BID) | ORAL | Status: DC

## 2011-12-28 MED ORDER — HYDROCORTISONE 1 % EX CREA
TOPICAL_CREAM | Freq: Once | CUTANEOUS | Status: AC
  Administered 2011-12-28: 10:00:00 via TOPICAL
  Filled 2011-12-28: qty 28

## 2011-12-28 MED ORDER — HYDROCODONE-ACETAMINOPHEN 5-325 MG PO TABS: 1.0000 | ORAL_TABLET | ORAL | Status: AC | PRN

## 2011-12-28 NOTE — Discharge Summary (Signed)
Discharge Summary   Patient ID: [name] [name],  MRN: [id number], DOB/AGE: 52-May-1962 52 y.o.  Admit date: 12/27/2011 Discharge date: 12/28/2011  Primary Physician: Oliver Barre, MD Primary Cardiologist: Nicholes Mango, MD; Hillis Range, MD (EP)  Discharge Diagnoses Principal Problem:  *Paroxysmal atrial flutter Active Problems:  HYPERLIPIDEMIA  HYPERTENSION  Paroxysmal atrial fibrillation   Allergies Allergies  Allergen Reactions  . Ibuprofen Other (See Comments)    REACTION: rapid heart beat    Diagnostic Studies/Procedures  RADIOFREQUENCY CATHETER ABLATION/DCCV/EP STUDY - 12/27/11  CONCLUSIONS:  1. Atrial fibrillation upon presentation today successfully terminated  with cardioversion.  2. The patient's clinical arrhythmia has been typical atrial flutter.  Today I was able to demonstrate counterclockwise right atrial  flutter with return of conduction through the cavotricuspid  isthmus.  3. Cavotricuspid isthmus ablation was performed today with complete  bidirectional isthmus block achieved.  4. No inducible arrhythmias following ablation.  5. No early apparent complications.   History of Present Illness/Hospital Course  [name] is a 51yo AA male with the above problem list who was referred to EP by Dr. Gala Romney for symptomatic atrial flutter. He underwent RFA in 08/2011, but on follow-up, was found to have reverted to atrial flutter with associated postural dizziness and mild sob. Therapeutic strategies including medical management vs repeat ablation were discussed at length with the patient, including risks, benefits and alternative to EP study and RFA. The decision was made to proceed to repeat RFA. He presented for the procedure yesterday. Informed consent was obtained. As above, this resulted evidence of atrial fibrillation with successful DCCV to NSR. RFA ablation of the cavotricuspid isthmus was achieved. No inducible arrhythmias were noted. The patient  tolerated the procedure well without complications. He was assessed by Dr. Johney Frame today and found to be stable for discharge. Cardizem will be discontinued in the setting of asymptomatic, mild sinus bradycardia. He was advised to continue flecainide 100mg  PO BID until follow-up in 4 weeks. At that time, if the patient is in NSR, will discontinue this. He will resume Coumadin, and follow-up in the Coumadin clinic 1 week post-RFA. He was given a prescription for Norco PRN for pain as well.   Discharge Vitals:  Blood pressure 130/70, pulse 51, temperature 97.7 F (36.5 C), temperature source Oral, resp. rate 23, height 6' 0.5" (1.842 m), weight 89.812 kg (198 lb), SpO2 98.00%.   Labs:  Lab 12/23/11 0947  NA 139  K 3.8  CL 107  CO2 24  BUN 19  CREATININE 1.0  CALCIUM 9.0  PROT --  BILITOT --  ALKPHOS --  ALT --  AST --  AMYLASE --  LIPASE --  GLUCOSE 119*   Disposition:  Discharge Orders    Future Appointments: Provider: Department: Dept Phone: Center:   01/03/2012 8:45 AM [facility] [phone] None   02/12/2012 9:30 AM Hillis Range, MD [facility] [phone] [facility]   02/14/2012 9:00 AM Corwin Levins, MD [facility] [phone] [facility]     Follow-up Information    Follow up with [facility]. (The office will contact you for follow-up with Dr. Johney Frame in 4 weeks. You will follow-up in the Coumadin clinic next week. )    Contact information:   [address] [place] [address]          Discharge Medications:  Medication List  As of 12/28/2011  9:20 AM   START taking these medications  flecainide 100 MG tablet   Commonly known as: TAMBOCOR   Take 1 tablet (100 mg total) by mouth 2 (two) times daily. Please resume prior home prescription as prescribed here.      HYDROcodone-acetaminophen 5-325 MG per tablet   Commonly known as: NORCO/VICODIN   Take 1-2 tablets by mouth every 4 (four) hours as  needed.         CONTINUE taking these medications         amLODipine 2.5 MG tablet   Commonly known as: NORVASC      atorvastatin 20 MG tablet   Commonly known as: LIPITOR      B-complex with vitamin C tablet      doxazosin 4 MG tablet   Commonly known as: CARDURA      losartan 100 MG tablet   Commonly known as: COZAAR      omega-3 acid ethyl esters 1 G capsule   Commonly known as: LOVAZA      pantoprazole 40 MG tablet   Commonly known as: PROTONIX   Take 1 tablet (40 mg total) by mouth daily.      tadalafil 20 MG tablet   Commonly known as: CIALIS      warfarin 7.5 MG tablet   Commonly known as: COUMADIN         STOP taking these medications         diltiazem 180 MG 24 hr capsule          Where to get your medications    These are the prescriptions that you need to pick up.   You may get these medications from any pharmacy.         HYDROcodone-acetaminophen 5-325 MG per tablet         Information on where to get these meds is not yet available. Ask your nurse or doctor.         flecainide 100 MG tablet           Outstanding Labs/Studies: None  Duration of Discharge Encounter: Greater than 30 minutes including physician time.  Signed, R. Hurman Horn, PA-C 12/28/2011, 9:20 AM    I have seen, examined the patient, and reviewed the above assessment and plan.  Changes to above are made where necessary.    Co Sign: Hillis Range, MD 12/28/2011 3:02 PM

## 2011-12-28 NOTE — Progress Notes (Signed)
   SUBJECTIVE: The patient is doing well today.  At this time, he denies chest pain, shortness of breath, or any new concerns.     Marland Kitchen amLODipine  2.5 mg Oral Daily  . atorvastatin  20 mg Oral q1800  . bupivacaine      . doxazosin  4 mg Oral QHS  . losartan  100 mg Oral Daily  . pantoprazole  40 mg Oral Daily  . sodium chloride  3 mL Intravenous Q12H  . warfarin  7.5 mg Oral q1800  . Warfarin - Physician Dosing Inpatient   Does not apply q1800  . DISCONTD: losartan  100 mg Oral Daily      OBJECTIVE: Physical Exam: Filed Vitals:   12/27/11 1700 12/27/11 2100 12/28/11 0000 12/28/11 0500  BP: 107/71 105/72  108/65  Pulse: 53 51 48   Temp:  97.9 F (36.6 C) 97.6 F (36.4 C) 97.4 F (36.3 C)  TempSrc:  Oral Oral   Resp: 16 18    Height:      Weight:      SpO2: 100% 100% 98%     Intake/Output Summary (Last 24 hours) at 12/28/11 1610 Last data filed at 12/28/11 0600  Gross per 24 hour  Intake    980 ml  Output    600 ml  Net    380 ml    Telemetry reveals sinus rhythm  GEN- The patient is well appearing, alert and oriented x 3 today.   Head- normocephalic, atraumatic Eyes-  Sclera clear, conjunctiva pink Ears- hearing intact Oropharynx- clear Neck- supple, no JVP Lymph- no cervical lymphadenopathy Lungs- Clear to ausculation bilaterally, normal work of breathing Heart- Regular rate and rhythm, no murmurs, rubs or gallops, PMI not laterally displaced GI- soft, NT, ND, + BS Extremities- no clubbing, cyanosis, or edema Skin- no rash or lesion Psych- euthymic mood, full affect Neuro- strength and sensation are intact  ASSESSMENT AND PLAN:  Active Problems:  Paroxysmal atrial flutter  Paroxysmal atrial fibrillation  1. Atrial flutter-  Doing well s/p ablation Stop diltiazem  2. Atrial fibrillation- stable Resume flecainide 100mg  BID Continue coumadin  3. Bradycardia- asymptomatic Stop diltiazem  Follow-up with me in 4 weeks I will likely stop flecainide  at that point if no further arrhythmias  Johnell Comings, MD 12/28/2011 8:07 AM

## 2011-12-28 NOTE — Op Note (Signed)
NAMEAMJAD, FIKES NO.:  [id number]  MEDICAL RECORD NO.:  [id number]  LOCATION:  6523                         FACILITY:  [facility]  PHYSICIAN:  Hillis Range, MD       DATE OF BIRTH:  1960/09/07  DATE OF PROCEDURE:  12/27/2011 DATE OF DISCHARGE:                              OPERATIVE REPORT   PREPROCEDURE DIAGNOSES: 1. Atrial flutter. 2. Atrial fibrillation.  POSTPROCEDURE DIAGNOSES: 1. Right atrial flutter. 2. Atrial fibrillation.  PROCEDURES: 1. Comprehensive EP study. 2. Coronary sinus pacing and recording. 3. Mapping of atrial flutter. 4. Ablation of atrial flutter. 5. Cardioversion.  INTRODUCTION:  Mr. Broner is a pleasant 51 year old gentleman with a history of symptomatic atrial flutter and atrial fibrillation.  He has previously failed antiarrhythmic medicine and underwent prior catheter ablation by me in March 2013.  He did well initially following ablation. Unfortunately, he has developed recurrent typical-appearing atrial flutter.  He therefore presents today for EP study and radiofrequency ablation.  DESCRIPTION OF PROCEDURE:  Informed written consent was obtained, and the patient was brought to the electrophysiology lab in a fasting state. He was adequately sedated with intravenous medications as outlined in the anesthesia report.  The patient's right groin was prepped and draped in the usual sterile fashion by the EP lab staff.  Using a percutaneous Seldinger technique, one 6, one 7, and one 8-French hemostasis sheaths were placed into the right common femoral vein.  A 6-French decapolar Polaris X catheter was introduced through the right common femoral vein and advanced into the coronary sinus for recording and pacing from this location.  A 6-French quadripolar Josephson catheter was introduced through the right common femoral vein and advanced into the right ventricle for recording and pacing.  This catheter was then pulled back to the  His bundle location.  The patient presented to the electrophysiology lab today in atrial fibrillation.  He was recently in the office with typical atrial flutter, which has subsequently degenerated while waiting to have this procedure performed into atrial fibrillation.  During atrial fibrillation, his average RR interval was 1099 msec.  There were occasional prolonged RR intervals up to 2000 msec observed.  His QRS duration was 93 msec with a QT interval of 380 msec. His HV interval measured 47 msec.  The patient was cardioverted with cardioversion electrodes in the anterior-posterior thoracic configuration with a single 360-joule biphasic shock delivered with cardioversion electrodes in the anterior-posterior thoracic configuration.  Once sinus rhythm was achieved, atrial pacing was begun at a cycle length of 600 msec and the patient was noted to have AV Wenckebach.  His AV Wenckebach cycle length was found to be 700 msec. In sinus rhythm, his PR interval measured 171 msec with a QRS duration of 88 msec and a QT interval of 468 msec.  His RR interval measured 1407 msec.  His AH interval measured 106 msec with an HV interval of 41 msec. Ventricular pacing was performed, which revealed midline concentric decremental VA conduction with a VA Wenckebach cycle length of 670 msec. A duodecapolar Halo catheter was introduced through the right common femoral vein and advanced into the right atrium.  This catheter was positioned around the  tricuspid valve annulus.  Pacing from the coronary sinus was performed and revealed that the patient had, had return of conduction through the cavotricuspid isthmus with a stimulus earliest atrial activation recorded across the isthmus measuring only 60 msec. Rapid atrial pacing was performed down to a cycle length of 220 msec at which time, the patient developed counter clockwise annular rotation around the right atrium with proximal to distal coronary  sinus activation suggestive of right atrial flutter.  Entrainment was performed from the left atrium, which revealed a long post pacing interval when compared to the tachycardia cycle length.  Entrainment was attempted from the cavotricuspid isthmus, however, the patient converted to atrial fibrillation with atrial pacing and again required cardioversion with a single 360-joule shock delivered.  Sinus rhythm was therefore achieved and atrial pacing was again begun.  As his clinical arrhythmia is typical atrial flutter and today I was able to demonstrate counterclockwise annular rotation around the tricuspid valve anulus, it was felt prudent to perform repeat catheter ablation along the cavotricuspid isthmus.  A 7-French [employer] II XP 8 mm ablation catheter was therefore advanced through the right common femoral vein and advanced into the right atrium.  Mapping along the cavotricuspid isthmus was performed, which revealed a standard isthmus. A series of 8 radiofrequency applications were delivered along the cavotricuspid isthmus with a target temperature of 60 degrees at 50 watts for 120 seconds each.  Complete bidirectional cavotricuspid isthmus block was achieved as evident by differential atrial pacing. With the Halo catheter in place, the patient clearly had complete bidirectional isthmus block achieved.  The stimulus to earliest atrial activation recorded bidirectionally across the isthmus measured 160 msec.  The patient was observed for 20 minutes without return of conduction through the isthmus.  Following ablation, the AH interval measured 106 msec with an HV interval of 41 msec.  Rapid atrial pacing was again performed, which revealed an AV Wenckebach cycle length of 680 msec.  Rapid atrial pacing was continued down to a cycle length of 250 msec with no arrhythmias observed.  The procedure was therefore considered completed.  All catheters were removed and the  sheaths were aspirated and flushed.  The sheaths were removed and hemostasis was assured.  There were no early apparent complications.  CONCLUSIONS: 1. Atrial fibrillation upon presentation today successfully terminated     with cardioversion. 2. The patient's clinical arrhythmia has been typical atrial flutter.     Today I was able to demonstrate counterclockwise right atrial     flutter with return of conduction through the cavotricuspid     isthmus. 3. Cavotricuspid isthmus ablation was performed today with complete     bidirectional isthmus block achieved. 4. No inducible arrhythmias following ablation. 5. No early apparent complications.     Hillis Range, MD     JA/MEDQ  D:  12/27/2011  T:  12/28/2011  Job:  [id number]

## 2011-12-31 ENCOUNTER — Encounter: Payer: Self-pay | Admitting: Internal Medicine

## 2011-12-31 ENCOUNTER — Telehealth: Payer: Self-pay | Admitting: *Deleted

## 2011-12-31 NOTE — Telephone Encounter (Signed)
Patient called c/o being out of rhythm since last night.  I asked him to come over for an EKG.  Shows afib with a rate of 99.  Discussed with Dr Johney Frame to increase his Flecainide to 150mg  bid and call me back when he converts.  Pt verbalized understanding

## 2012-01-01 NOTE — Telephone Encounter (Signed)
Has taking 2 doses of the Flecainide 150mg  and still is out of rhythm, I will continue to check on him and see if he converts

## 2012-01-02 NOTE — Telephone Encounter (Signed)
Spoke with patient and he feels like his heart in trying to convert

## 2012-01-03 ENCOUNTER — Ambulatory Visit (INDEPENDENT_AMBULATORY_CARE_PROVIDER_SITE_OTHER): Payer: BC Managed Care – PPO

## 2012-01-03 DIAGNOSIS — I480 Paroxysmal atrial fibrillation: Secondary | ICD-10-CM

## 2012-01-03 DIAGNOSIS — I4891 Unspecified atrial fibrillation: Secondary | ICD-10-CM

## 2012-01-03 LAB — POCT INR: INR: 3.6

## 2012-01-17 ENCOUNTER — Ambulatory Visit (INDEPENDENT_AMBULATORY_CARE_PROVIDER_SITE_OTHER): Payer: BC Managed Care – PPO

## 2012-01-17 DIAGNOSIS — I480 Paroxysmal atrial fibrillation: Secondary | ICD-10-CM

## 2012-01-17 DIAGNOSIS — I4891 Unspecified atrial fibrillation: Secondary | ICD-10-CM

## 2012-01-17 MED ORDER — WARFARIN SODIUM 5 MG PO TABS: ORAL_TABLET | ORAL | Status: DC

## 2012-01-27 ENCOUNTER — Encounter: Payer: Self-pay | Admitting: Internal Medicine

## 2012-01-27 ENCOUNTER — Ambulatory Visit (INDEPENDENT_AMBULATORY_CARE_PROVIDER_SITE_OTHER): Payer: BC Managed Care – PPO | Admitting: Internal Medicine

## 2012-01-27 VITALS — BP 136/86 | HR 44 | Ht 72.0 in | Wt 198.0 lb

## 2012-01-27 DIAGNOSIS — I4891 Unspecified atrial fibrillation: Secondary | ICD-10-CM

## 2012-01-27 DIAGNOSIS — I4892 Unspecified atrial flutter: Secondary | ICD-10-CM

## 2012-01-27 DIAGNOSIS — I480 Paroxysmal atrial fibrillation: Secondary | ICD-10-CM

## 2012-01-27 MED ORDER — FLECAINIDE ACETATE 100 MG PO TABS: 100.0000 mg | ORAL_TABLET | Freq: Two times a day (BID) | ORAL | Status: DC

## 2012-01-27 MED ORDER — DILTIAZEM HCL 30 MG PO TABS: ORAL_TABLET | ORAL | Status: DC

## 2012-01-27 NOTE — Assessment & Plan Note (Signed)
Decrease flecainide to 100mg  bid Continue coumadin  Return in 3 months He can take cardizem as needed for afib with RVR

## 2012-01-27 NOTE — Patient Instructions (Addendum)
Your physician recommends that you schedule a follow-up appointment in: 3 MONTHS WITH DR Allred  DECREASE FLECAINIDE TO 100 MG TWICE DAILY  TAKE CARDIZEM 30 MG ONE TABLET EVERY 6 HOURS AS NEEDED FOR ELEVATED HEART RATE

## 2012-01-27 NOTE — Progress Notes (Signed)
PCP: [name] Barre, MD Primary Cardiologist:  Dr Kerrin Champagne is a 51 y.o. male who presents today for routine electrophysiology followup.  Since his repeat atrial flutter ablation, the patient reports doing very well.  He initially had afib following the procedure.  He was restarted on flecainide and has done well since.  He denies any further arrhythmias.  He denies procedure related complications. Today, he denies symptoms of palpitations, chest pain, shortness of breath,  lower extremity edema, dizziness, presyncope, or syncope.  The patient is otherwise without complaint today.   Past Medical History  Diagnosis Date  . Abdominal pain, epigastric 01/01/2008  . Paroxysmal atrial fibrillation 12/15/2007  . CHEST PAIN-PRECORDIAL 05/25/2009  . ERECTILE DYSFUNCTION, ORGANIC 01/16/2010  . HYPERLIPIDEMIA 12/15/2007  . HYPERTENSION 12/15/2007  . HYPOKALEMIA 12/15/2007  . POSTURAL LIGHTHEADEDNESS 07/06/2008  . Atrial flutter    Past Surgical History  Procedure Date  . Inguinal herniorrhapy 6/08  . Hernia repair   . Elbow surgery bone spur  . Tee without cardioversion 08/12/2011    Procedure: TRANSESOPHAGEAL ECHOCARDIOGRAM (TEE);  Surgeon: Pricilla Riffle, MD;  Location: [facility] ENDOSCOPY;  Service: Cardiovascular;  Laterality: N/A;  . Atrial fibrillation and atrial flutter ablation 08/13/11    PVI and CTI ablation by Dr Johney Frame  . Cardioversion 11/21/2011    Procedure: CARDIOVERSION;  Surgeon: Laurey Morale, MD;  Location: [facility] OR;  Service: Cardiovascular;  Laterality: N/A;  . Ablation of dysrhythmic focus 12/27/2011    CTI repeat ablation by Dr Johney Frame    Current Outpatient Prescriptions  Medication Sig Dispense Refill  . amLODipine (NORVASC) 2.5 MG tablet Take 2.5 mg by mouth daily.      Marland Kitchen atorvastatin (LIPITOR) 20 MG tablet Take 20 mg by mouth daily.      . B Complex-C (B-COMPLEX WITH VITAMIN C) tablet Take 1 tablet by mouth daily.      Marland Kitchen doxazosin (CARDURA) 4 MG tablet Take 4 mg by mouth  at bedtime.      . flecainide (TAMBOCOR) 100 MG tablet Take 1 tablet (100 mg total) by mouth 2 (two) times daily. Please resume prior home prescription as prescribed here.      . losartan (COZAAR) 100 MG tablet Take 100 mg by mouth daily.      Marland Kitchen omega-3 acid ethyl esters (LOVAZA) 1 G capsule Take 1 g by mouth daily.       . pantoprazole (PROTONIX) 40 MG tablet Take 1 tablet (40 mg total) by mouth daily.  30 tablet  6  . tadalafil (CIALIS) 20 MG tablet Take 20 mg by mouth daily as needed. For erectile dysfunction      . warfarin (COUMADIN) 5 MG tablet Take as directed by anticoagulation clinic  45 tablet  3    Physical Exam: Filed Vitals:   01/27/12 0909  BP: 136/86  Pulse: 44  Height: 6' (1.829 m)  Weight: 198 lb (89.812 kg)    GEN- The patient is well appearing, alert and oriented x 3 today.   Head- normocephalic, atraumatic Eyes-  Sclera clear, conjunctiva pink Ears- hearing intact Oropharynx- clear Lungs- Clear to ausculation bilaterally, normal work of breathing Heart- Regular rate and rhythm, no murmurs, rubs or gallops, PMI not laterally displaced GI- soft, NT, ND, + BS Extremities- no clubbing, cyanosis, or edema  ekg today reveals sinus bradycardia 44 bpm, PR 212, QRS 104, Qtc 424  Assessment and Plan:

## 2012-01-27 NOTE — Assessment & Plan Note (Signed)
Resolved s/p ablation  

## 2012-02-04 ENCOUNTER — Other Ambulatory Visit (HOSPITAL_COMMUNITY): Payer: Self-pay | Admitting: *Deleted

## 2012-02-04 MED ORDER — AMLODIPINE BESYLATE 2.5 MG PO TABS: 2.5000 mg | ORAL_TABLET | Freq: Every day | ORAL | Status: DC

## 2012-02-05 ENCOUNTER — Encounter: Payer: Self-pay | Admitting: Pharmacist

## 2012-02-07 ENCOUNTER — Ambulatory Visit (INDEPENDENT_AMBULATORY_CARE_PROVIDER_SITE_OTHER): Payer: BC Managed Care – PPO | Admitting: Pharmacist

## 2012-02-07 DIAGNOSIS — I480 Paroxysmal atrial fibrillation: Secondary | ICD-10-CM

## 2012-02-07 DIAGNOSIS — I4891 Unspecified atrial fibrillation: Secondary | ICD-10-CM

## 2012-02-07 LAB — POCT INR: INR: 2.2

## 2012-02-12 ENCOUNTER — Ambulatory Visit: Payer: BC Managed Care – PPO | Admitting: Internal Medicine

## 2012-02-14 ENCOUNTER — Ambulatory Visit: Payer: PRIVATE HEALTH INSURANCE | Admitting: Internal Medicine

## 2012-02-24 ENCOUNTER — Other Ambulatory Visit (INDEPENDENT_AMBULATORY_CARE_PROVIDER_SITE_OTHER): Payer: BC Managed Care – PPO

## 2012-02-24 DIAGNOSIS — Z0000 Encounter for general adult medical examination without abnormal findings: Secondary | ICD-10-CM

## 2012-02-24 LAB — CBC WITH DIFFERENTIAL/PLATELET
Eosinophils Absolute: 0.2 10*3/uL (ref 0.0–0.7)
Eosinophils Relative: 2.4 % (ref 0.0–5.0)
HCT: 38.2 % — ABNORMAL LOW (ref 39.0–52.0)
Hemoglobin: 12.6 g/dL — ABNORMAL LOW (ref 13.0–17.0)
Lymphocytes Relative: 25.4 % (ref 12.0–46.0)
Monocytes Absolute: 0.8 10*3/uL (ref 0.1–1.0)
Monocytes Relative: 9.3 % (ref 3.0–12.0)
RBC: 4.2 Mil/uL — ABNORMAL LOW (ref 4.22–5.81)
RDW: 13.3 % (ref 11.5–14.6)

## 2012-02-24 LAB — URINALYSIS, ROUTINE W REFLEX MICROSCOPIC
Bilirubin Urine: NEGATIVE
Ketones, ur: NEGATIVE
Leukocytes, UA: NEGATIVE
Urine Glucose: NEGATIVE
Urobilinogen, UA: 0.2 (ref 0.0–1.0)
pH: 7 (ref 5.0–8.0)

## 2012-02-25 LAB — BASIC METABOLIC PANEL
BUN: 17 mg/dL (ref 6–23)
Chloride: 106 mEq/L (ref 96–112)
Glucose, Bld: 92 mg/dL (ref 70–99)
Potassium: 3.5 mEq/L (ref 3.5–5.1)

## 2012-02-25 LAB — HEPATIC FUNCTION PANEL
Bilirubin, Direct: 0.1 mg/dL (ref 0.0–0.3)
Total Bilirubin: 0.9 mg/dL (ref 0.3–1.2)

## 2012-02-25 LAB — TSH: TSH: 1.41 u[IU]/mL (ref 0.35–5.50)

## 2012-02-25 LAB — LIPID PANEL
LDL Cholesterol: 72 mg/dL (ref 0–99)
VLDL: 26.4 mg/dL (ref 0.0–40.0)

## 2012-02-25 LAB — PSA: PSA: 0.75 ng/mL (ref 0.10–4.00)

## 2012-02-26 ENCOUNTER — Ambulatory Visit (INDEPENDENT_AMBULATORY_CARE_PROVIDER_SITE_OTHER): Payer: BC Managed Care – PPO | Admitting: Internal Medicine

## 2012-02-26 ENCOUNTER — Telehealth: Payer: Self-pay | Admitting: Internal Medicine

## 2012-02-26 ENCOUNTER — Encounter: Payer: Self-pay | Admitting: Internal Medicine

## 2012-02-26 VITALS — BP 130/80 | HR 57 | Temp 97.0°F | Ht 72.0 in | Wt 198.0 lb

## 2012-02-26 DIAGNOSIS — K625 Hemorrhage of anus and rectum: Secondary | ICD-10-CM

## 2012-02-26 DIAGNOSIS — Z23 Encounter for immunization: Secondary | ICD-10-CM

## 2012-02-26 DIAGNOSIS — N529 Male erectile dysfunction, unspecified: Secondary | ICD-10-CM

## 2012-02-26 DIAGNOSIS — I10 Essential (primary) hypertension: Secondary | ICD-10-CM

## 2012-02-26 DIAGNOSIS — Z0000 Encounter for general adult medical examination without abnormal findings: Secondary | ICD-10-CM

## 2012-02-26 MED ORDER — IRBESARTAN 300 MG PO TABS: 300.0000 mg | ORAL_TABLET | Freq: Every day | ORAL | Status: DC

## 2012-02-26 NOTE — Telephone Encounter (Signed)
Left a message for patient to call me back to schedule OV.

## 2012-02-26 NOTE — Patient Instructions (Addendum)
Continue all other medications as before Please have the pharmacy call with any refills you may need. You will be contacted regarding the referral for: Gastroenterology (for office visit, since you are on coumadin) Ok to finish the losartan that you have Then start the generic for Avapro 300 mg per day (please note the 300 mg of this medication is like the 100 mg of the losartan) You had the flu shot today You are otherwise up to date with prevention We will add the testosterone to labs for next time Please return in 1 year for your yearly visit, or sooner if needed, with Lab testing done 3-5 days before

## 2012-02-27 NOTE — Telephone Encounter (Signed)
Spoke with patient and offered OV on 03/03/12 but he states he cannot come until late next week. Scheduled patient on 03/06/12 at 2:30 PM.

## 2012-02-28 ENCOUNTER — Encounter: Payer: Self-pay | Admitting: *Deleted

## 2012-02-29 ENCOUNTER — Encounter: Payer: Self-pay | Admitting: Internal Medicine

## 2012-02-29 NOTE — Assessment & Plan Note (Addendum)
For colonoscopy but is on coumadin, for GI referral

## 2012-02-29 NOTE — Progress Notes (Signed)
Subjective:    Patient ID: [name] [name], male    DOB: 02/22/1961, 51 y.o.   MRN: [id number]  HPI  Here for wellness and f/u;  Overall doing ok;  Pt denies CP, worsening SOB, DOE, wheezing, orthopnea, PND, worsening LE edema, palpitations, dizziness or syncope.  Pt denies neurological change such as new Headache, facial or extremity weakness.  Pt denies polydipsia, polyuria, or low sugar symptoms. Pt states overall good compliance with treatment and medications, good tolerability, and trying to follow lower cholesterol diet.  Pt denies worsening depressive symptoms, suicidal ideation or panic. No fever, wt loss, night sweats, loss of appetite, or other constitutional symptoms.  Pt states good ability with ADL's, low fall risk, home safety reviewed and adequate, no significant changes in hearing or vision, and occasionally active with exercise.  Has had several episodes small volume BRBPR in the past few months.  BP at home has been slightly elevated. Past Medical History  Diagnosis Date  . Paroxysmal atrial fibrillation 12/15/2007  . CHEST PAIN-PRECORDIAL 05/25/2009  . ERECTILE DYSFUNCTION, ORGANIC 01/16/2010  . HYPERLIPIDEMIA 12/15/2007  . HYPERTENSION 12/15/2007  . HYPOKALEMIA 12/15/2007  . POSTURAL LIGHTHEADEDNESS 07/06/2008  . Atrial flutter   . Internal hemorrhoids    Past Surgical History  Procedure Date  . Inguinal herniorrhapy 6/08  . Hernia repair   . Elbow surgery bone spur  . Tee without cardioversion 08/12/2011    Procedure: TRANSESOPHAGEAL ECHOCARDIOGRAM (TEE);  Surgeon: Pricilla Riffle, MD;  Location: [facility] ENDOSCOPY;  Service: Cardiovascular;  Laterality: N/A;  . Atrial fibrillation and atrial flutter ablation 08/13/11    PVI and CTI ablation by Dr Johney Frame  . Cardioversion 11/21/2011    Procedure: CARDIOVERSION;  Surgeon: Laurey Morale, MD;  Location[facility] OR;  Service: Cardiovascular;  Laterality: N/A;  . Ablation of dysrhythmic focus 12/27/2011    CTI repeat ablation by Dr Johney Frame    reports that he quit smoking about 14 months ago. His smoking use included Cigarettes. He has a 4.5 pack-year smoking history. He has never used smokeless tobacco. He reports that he drinks about .6 ounces of alcohol per week. He reports that he does not use illicit drugs. family history includes Heart disease in his sister; Sleep apnea in his sister; Stroke in his mother; and Thyroid disease in his sister.  There is no history of Anesthesia problems. Allergies  Allergen Reactions  . Ibuprofen Other (See Comments)    REACTION: rapid heart beat   Current Outpatient Prescriptions on File Prior to Visit  Medication Sig Dispense Refill  . amLODipine (NORVASC) 2.5 MG tablet Take 1 tablet (2.5 mg total) by mouth daily.  30 tablet  6  . atorvastatin (LIPITOR) 20 MG tablet Take 20 mg by mouth daily.      . B Complex-C (B-COMPLEX WITH VITAMIN C) tablet Take 1 tablet by mouth daily.      Marland Kitchen diltiazem (CARDIZEM) 30 MG tablet Take one tablet every 6 hours as needed for elevated heart rate  30 tablet  12  . doxazosin (CARDURA) 4 MG tablet Take 4 mg by mouth at bedtime.      . flecainide (TAMBOCOR) 100 MG tablet Take 1 tablet (100 mg total) by mouth 2 (two) times daily. Please resume prior home prescription as prescribed here.      . omega-3 acid ethyl esters (LOVAZA) 1 G capsule Take 1 g by mouth daily.       . pantoprazole (PROTONIX) 40 MG tablet Take 1 tablet (40  mg total) by mouth daily.  30 tablet  6  . tadalafil (CIALIS) 20 MG tablet Take 20 mg by mouth daily as needed. For erectile dysfunction      . warfarin (COUMADIN) 5 MG tablet Take as directed by anticoagulation clinic  45 tablet  3  . irbesartan (AVAPRO) 300 MG tablet Take 1 tablet (300 mg total) by mouth at bedtime.  90 tablet  3   Review of Systems Review of Systems  Constitutional: Negative for diaphoresis, activity change, appetite change and unexpected weight change.  HENT: Negative for hearing loss, ear pain, facial swelling, mouth sores  and neck stiffness.   Eyes: Negative for pain, redness and visual disturbance.  Respiratory: Negative for shortness of breath and wheezing.   Cardiovascular: Negative for chest pain and palpitations.  Gastrointestinal: Negative for diarrhea, blood in stool, abdominal distention and rectal pain.  Genitourinary: Negative for hematuria, flank pain and decreased urine volume.  Musculoskeletal: Negative for myalgias and joint swelling.  Skin: Negative for color change and wound.  Neurological: Negative for syncope and numbness.  Hematological: Negative for adenopathy.  Psychiatric/Behavioral: Negative for hallucinations, self-injury, decreased concentration and agitation.      Objective:   Physical Exam BP 130/80  Pulse 57  Temp 97 F (36.1 C) (Oral)  Ht 6' (1.829 m)  Wt 198 lb (89.812 kg)  BMI 26.85 kg/m2  SpO2 98% Physical Exam  VS noted Constitutional: Pt is oriented to person, place, and time. Appears well-developed and well-nourished.  HENT:  Head: Normocephalic and atraumatic.  Right Ear: External ear normal.  Left Ear: External ear normal.  Nose: Nose normal.  Mouth/Throat: Oropharynx is clear and moist.  Eyes: Conjunctivae and EOM are normal. Pupils are equal, round, and reactive to light.  Neck: Normal range of motion. Neck supple. No JVD present. No tracheal deviation present.  Cardiovascular: Normal rate, regular rhythm, normal heart sounds and intact distal pulses.   Pulmonary/Chest: Effort normal and breath sounds normal.  Abdominal: Soft. Bowel sounds are normal. There is no tenderness.  Musculoskeletal: Normal range of motion. Exhibits no edema.  Lymphadenopathy:  Has no cervical adenopathy.  Neurological: Pt is alert and oriented to person, place, and time. Pt has normal reflexes. No cranial nerve deficit.  Skin: Skin is warm and dry. No rash noted.  Psychiatric:  Has  normal mood and affect. Behavior is normal.     Assessment & Plan:

## 2012-02-29 NOTE — Assessment & Plan Note (Signed)
To change cozaar to avapro for better control,  to f/u any worsening symptoms or concerns

## 2012-02-29 NOTE — Assessment & Plan Note (Signed)
For cialis prn 

## 2012-02-29 NOTE — Assessment & Plan Note (Signed)

## 2012-03-06 ENCOUNTER — Ambulatory Visit (INDEPENDENT_AMBULATORY_CARE_PROVIDER_SITE_OTHER): Payer: BC Managed Care – PPO | Admitting: *Deleted

## 2012-03-06 ENCOUNTER — Ambulatory Visit: Payer: BC Managed Care – PPO | Admitting: Internal Medicine

## 2012-03-06 DIAGNOSIS — I4891 Unspecified atrial fibrillation: Secondary | ICD-10-CM

## 2012-03-06 DIAGNOSIS — I480 Paroxysmal atrial fibrillation: Secondary | ICD-10-CM

## 2012-03-13 ENCOUNTER — Telehealth: Payer: Self-pay | Admitting: Internal Medicine

## 2012-03-13 NOTE — Telephone Encounter (Signed)
Message copied by Arna Snipe on Fri Mar 13, 2012  2:51 PM ------      Message from: Richardson Chiquito      Created: Mon Mar 09, 2012  2:43 PM                   ----- Message -----         From: Hart Carwin, MD         Sent: 03/09/2012   2:43 PM           To: Richardson Chiquito, CMA            Yes, charge a no show fee. DB      ----- Message -----         From: Richardson Chiquito, CMA         Sent: 03/09/2012  11:18 AM           To: Hart Carwin, MD            Patient n/s appt with Dr Juanda Chance on 03-06-12. Dr Juanda Chance, do you want to charge no show fee?

## 2012-03-16 ENCOUNTER — Other Ambulatory Visit (HOSPITAL_COMMUNITY): Payer: Self-pay | Admitting: Nurse Practitioner

## 2012-03-27 ENCOUNTER — Encounter: Payer: Self-pay | Admitting: Internal Medicine

## 2012-04-05 ENCOUNTER — Other Ambulatory Visit: Payer: Self-pay | Admitting: Internal Medicine

## 2012-04-29 ENCOUNTER — Ambulatory Visit (INDEPENDENT_AMBULATORY_CARE_PROVIDER_SITE_OTHER): Payer: BC Managed Care – PPO | Admitting: Internal Medicine

## 2012-04-29 ENCOUNTER — Encounter: Payer: Self-pay | Admitting: Internal Medicine

## 2012-04-29 VITALS — BP 139/91 | HR 94 | Ht 72.0 in | Wt 196.0 lb

## 2012-04-29 DIAGNOSIS — I4892 Unspecified atrial flutter: Secondary | ICD-10-CM

## 2012-04-29 DIAGNOSIS — E785 Hyperlipidemia, unspecified: Secondary | ICD-10-CM

## 2012-04-29 DIAGNOSIS — I4819 Other persistent atrial fibrillation: Secondary | ICD-10-CM

## 2012-04-29 DIAGNOSIS — I4891 Unspecified atrial fibrillation: Secondary | ICD-10-CM

## 2012-04-29 DIAGNOSIS — I10 Essential (primary) hypertension: Secondary | ICD-10-CM

## 2012-04-29 DIAGNOSIS — R072 Precordial pain: Secondary | ICD-10-CM

## 2012-04-29 DIAGNOSIS — I480 Paroxysmal atrial fibrillation: Secondary | ICD-10-CM

## 2012-04-29 NOTE — Patient Instructions (Addendum)
Your physician recommends that you schedule a follow-up appointment in 3 months with Dr Allred    

## 2012-05-09 ENCOUNTER — Other Ambulatory Visit: Payer: Self-pay | Admitting: Internal Medicine

## 2012-05-10 NOTE — Assessment & Plan Note (Signed)
Denies exertional symptoms Pain is atypical.  No further workup is planned at this time.

## 2012-05-10 NOTE — Assessment & Plan Note (Signed)
Stable No change required today  

## 2012-05-10 NOTE — Progress Notes (Signed)
PCP: [name] Barre, MD Primary Cardiologist:  [name] [name] is a 51 y.o. male who presents today for routine electrophysiology followup. He has returned to atrial fibrillation. He reports symptoms of fatigue. Today, he denies symptoms of palpitations, chest pain,   lower extremity edema, dizziness, presyncope, or syncope.  He has occasional SOB.  The patient is otherwise without complaint today.   Past Medical History  Diagnosis Date  . Paroxysmal atrial fibrillation 12/15/2007  . CHEST PAIN-PRECORDIAL 05/25/2009  . ERECTILE DYSFUNCTION, ORGANIC 01/16/2010  . HYPERLIPIDEMIA 12/15/2007  . HYPERTENSION 12/15/2007  . HYPOKALEMIA 12/15/2007  . POSTURAL LIGHTHEADEDNESS 07/06/2008  . Atrial flutter   . Internal hemorrhoids    Past Surgical History  Procedure Date  . Inguinal herniorrhapy 6/08  . Hernia repair   . Elbow surgery bone spur  . Tee without cardioversion 08/12/2011    Procedure: TRANSESOPHAGEAL ECHOCARDIOGRAM (TEE);  Surgeon: Pricilla Riffle, MD;  Location: [facility] ENDOSCOPY;  Service: Cardiovascular;  Laterality: N/A;  . Atrial fibrillation and atrial flutter ablation 08/13/11    PVI and CTI ablation by [name] Johney Frame  . Cardioversion 11/21/2011    Procedure: CARDIOVERSION;  Surgeon: Laurey Morale, MD;  Location: [facility] OR;  Service: Cardiovascular;  Laterality: N/A;  . Ablation of dysrhythmic focus 12/27/2011    CTI repeat ablation by [name] Johney Frame    Current Outpatient Prescriptions  Medication Sig Dispense Refill  . amLODipine (NORVASC) 2.5 MG tablet Take 1 tablet (2.5 mg total) by mouth daily.  30 tablet  6  . atorvastatin (LIPITOR) 20 MG tablet Take 20 mg by mouth daily.      Marland Kitchen atorvastatin (LIPITOR) 20 MG tablet TAKE 1 TABLET BY MOUTH EVERY DAY  30 tablet  0  . B Complex-C (B-COMPLEX WITH VITAMIN C) tablet Take 1 tablet by mouth daily.      Marland Kitchen diltiazem (CARDIZEM) 30 MG tablet Take one tablet every 6 hours as needed for elevated heart rate  30 tablet  12  . flecainide (TAMBOCOR) 100  MG tablet Take 1 tablet (100 mg total) by mouth 2 (two) times daily. Please resume prior home prescription as prescribed here.      . irbesartan (AVAPRO) 300 MG tablet Take 1 tablet (300 mg total) by mouth at bedtime.  90 tablet  3  . omega-3 acid ethyl esters (LOVAZA) 1 G capsule Take 1 g by mouth daily.       . pantoprazole (PROTONIX) 40 MG tablet TAKE 1 TABLET EVERY DAY  30 tablet  6  . tadalafil (CIALIS) 20 MG tablet Take 20 mg by mouth daily as needed. For erectile dysfunction      . warfarin (COUMADIN) 5 MG tablet Take as directed by anticoagulation clinic  45 tablet  3  . doxazosin (CARDURA) 4 MG tablet Take 4 mg by mouth at bedtime.        Physical Exam: Filed Vitals:   04/29/12 0904  BP: 139/91  Pulse: 94  Height: 6' (1.829 m)  Weight: 196 lb (88.905 kg)    GEN- The patient is well appearing, alert and oriented x 3 today.   Head- normocephalic, atraumatic Eyes-  Sclera clear, conjunctiva pink Ears- hearing intact Oropharynx- clear Lungs- Clear to ausculation bilaterally, normal work of breathing Heart- irregular rate and rhythm, no murmurs, rubs or gallops, PMI not laterally displaced GI- soft, NT, ND, + BS Extremities- no clubbing, cyanosis, or edema  ekg today reveals afib, V rate 94 bpm, nonspecific ST/ T changes  Assessment and Plan:

## 2012-05-10 NOTE — Assessment & Plan Note (Signed)
The patient has returned to atrial fibrillation. Therapeutic strategies for afib including medicine and repeat ablation were discussed in detail with the patient today.  I think that the most prudent option would be to admit for tikosyn loading.  If he fails tikosyn, then our options would be repeat catheter ablation vs convergent procedure. He wishes to further contemplate this option and will contact our office if he decides to proceed with tikosyn.  He should continue anticoagulation in the interim.

## 2012-05-21 ENCOUNTER — Ambulatory Visit (INDEPENDENT_AMBULATORY_CARE_PROVIDER_SITE_OTHER): Payer: BC Managed Care – PPO

## 2012-05-21 DIAGNOSIS — I4819 Other persistent atrial fibrillation: Secondary | ICD-10-CM

## 2012-05-21 DIAGNOSIS — I4891 Unspecified atrial fibrillation: Secondary | ICD-10-CM

## 2012-05-29 ENCOUNTER — Other Ambulatory Visit: Payer: Self-pay | Admitting: *Deleted

## 2012-05-29 MED ORDER — WARFARIN SODIUM 5 MG PO TABS: ORAL_TABLET | ORAL | Status: DC

## 2012-07-02 ENCOUNTER — Ambulatory Visit (INDEPENDENT_AMBULATORY_CARE_PROVIDER_SITE_OTHER): Payer: BC Managed Care – PPO | Admitting: *Deleted

## 2012-07-02 DIAGNOSIS — I4891 Unspecified atrial fibrillation: Secondary | ICD-10-CM

## 2012-07-02 DIAGNOSIS — I4819 Other persistent atrial fibrillation: Secondary | ICD-10-CM

## 2012-07-02 LAB — POCT INR: INR: 1.5

## 2012-07-15 ENCOUNTER — Ambulatory Visit (INDEPENDENT_AMBULATORY_CARE_PROVIDER_SITE_OTHER): Payer: BC Managed Care – PPO | Admitting: *Deleted

## 2012-07-15 DIAGNOSIS — I4891 Unspecified atrial fibrillation: Secondary | ICD-10-CM

## 2012-07-15 DIAGNOSIS — I4819 Other persistent atrial fibrillation: Secondary | ICD-10-CM

## 2012-07-29 ENCOUNTER — Ambulatory Visit (INDEPENDENT_AMBULATORY_CARE_PROVIDER_SITE_OTHER): Payer: BC Managed Care – PPO | Admitting: Internal Medicine

## 2012-07-29 ENCOUNTER — Encounter: Payer: Self-pay | Admitting: Internal Medicine

## 2012-07-29 VITALS — BP 131/95 | HR 54 | Ht 72.5 in | Wt 188.4 lb

## 2012-07-29 DIAGNOSIS — I4819 Other persistent atrial fibrillation: Secondary | ICD-10-CM

## 2012-07-29 DIAGNOSIS — I4891 Unspecified atrial fibrillation: Secondary | ICD-10-CM

## 2012-07-29 NOTE — Progress Notes (Signed)
PCP: Oliver Barre, MD Primary Cardiologist:  Dr Kerrin Champagne is a 52 y.o. male who presents today for routine electrophysiology followup.His afib is much improved.  He has had no episodes over the past few weeks. Today, he denies symptoms of palpitations, chest pain,   lower extremity edema, dizziness, presyncope, or syncope.  He has occasional SOB.  The patient is otherwise without complaint today.   Past Medical History  Diagnosis Date  . Paroxysmal atrial fibrillation 12/15/2007  . CHEST PAIN-PRECORDIAL 05/25/2009  . ERECTILE DYSFUNCTION, ORGANIC 01/16/2010  . HYPERLIPIDEMIA 12/15/2007  . HYPERTENSION 12/15/2007  . HYPOKALEMIA 12/15/2007  . POSTURAL LIGHTHEADEDNESS 07/06/2008  . Atrial flutter   . Internal hemorrhoids    Past Surgical History  Procedure Laterality Date  . Inguinal herniorrhapy  6/08  . Hernia repair    . Elbow surgery  bone spur  . Tee without cardioversion  08/12/2011    Procedure: TRANSESOPHAGEAL ECHOCARDIOGRAM (TEE);  Surgeon: Pricilla Riffle, MD;  Location: [facility] ENDOSCOPY;  Service: Cardiovascular;  Laterality: N/A;  . Atrial fibrillation and atrial flutter ablation  08/13/11    PVI and CTI ablation by Dr Johney Frame  . Cardioversion  11/21/2011    Procedure: CARDIOVERSION;  Surgeon: Laurey Morale, MD;  Location: [facility] OR;  Service: Cardiovascular;  Laterality: N/A;  . Ablation of dysrhythmic focus  12/27/2011    CTI repeat ablation by Dr Johney Frame    Current Outpatient Prescriptions  Medication Sig Dispense Refill  . amLODipine (NORVASC) 2.5 MG tablet Take 1 tablet (2.5 mg total) by mouth daily.  30 tablet  6  . atorvastatin (LIPITOR) 20 MG tablet TAKE 1 TABLET BY MOUTH EVERY DAY  30 tablet  11  . B Complex-C (B-COMPLEX WITH VITAMIN C) tablet Take 1 tablet by mouth daily.      Marland Kitchen diltiazem (CARDIZEM) 30 MG tablet Take one tablet every 6 hours as needed for elevated heart rate  30 tablet  12  . doxazosin (CARDURA) 4 MG tablet Take 4 mg by mouth at bedtime.      .  flecainide (TAMBOCOR) 100 MG tablet Take 1 tablet (100 mg total) by mouth 2 (two) times daily. Please resume prior home prescription as prescribed here.      . irbesartan (AVAPRO) 300 MG tablet Take 1 tablet (300 mg total) by mouth at bedtime.  90 tablet  3  . pantoprazole (PROTONIX) 40 MG tablet TAKE 1 TABLET EVERY DAY  30 tablet  6  . tadalafil (CIALIS) 20 MG tablet Take 20 mg by mouth daily as needed. For erectile dysfunction      . warfarin (COUMADIN) 5 MG tablet Take as directed by anticoagulation clinic  45 tablet  3  . omega-3 acid ethyl esters (LOVAZA) 1 G capsule Take 1 g by mouth daily.        No current facility-administered medications for this visit.    Physical Exam: Filed Vitals:   07/29/12 0916  BP: 131/95  Pulse: 54  Height: 6' 0.5" (1.842 m)  Weight: 188 lb 6.4 oz (85.458 kg)    GEN- The patient is well appearing, alert and oriented x 3 today.   Head- normocephalic, atraumatic Eyes-  Sclera clear, conjunctiva pink Ears- hearing intact Oropharynx- clear Lungs- Clear to ausculation bilaterally, normal work of breathing Heart- regular rate and rhythm, no murmurs, rubs or gallops, PMI not laterally displaced GI- soft, NT, ND, + BS Extremities- no clubbing, cyanosis, or edema  ekg today reveals sinus bradycardia 52  bpm, otherwise normal ekg  Assessment and Plan:

## 2012-07-29 NOTE — Assessment & Plan Note (Signed)
Doing well at this time with flecainde No changes today  Return in 3 months

## 2012-07-29 NOTE — Patient Instructions (Addendum)
Your physician recommends that you schedule a follow-up appointment in 3 months with Dr Allred    

## 2012-09-08 ENCOUNTER — Other Ambulatory Visit: Payer: Self-pay

## 2012-09-08 MED ORDER — AMLODIPINE BESYLATE 2.5 MG PO TABS: 2.5000 mg | ORAL_TABLET | Freq: Every day | ORAL | Status: DC

## 2012-09-19 ENCOUNTER — Other Ambulatory Visit: Payer: Self-pay | Admitting: Internal Medicine

## 2012-10-09 ENCOUNTER — Ambulatory Visit (INDEPENDENT_AMBULATORY_CARE_PROVIDER_SITE_OTHER): Payer: BC Managed Care – PPO | Admitting: *Deleted

## 2012-10-09 DIAGNOSIS — I4819 Other persistent atrial fibrillation: Secondary | ICD-10-CM

## 2012-10-09 DIAGNOSIS — I4891 Unspecified atrial fibrillation: Secondary | ICD-10-CM

## 2012-10-09 LAB — POCT INR: INR: 2.7

## 2012-10-09 MED ORDER — WARFARIN SODIUM 5 MG PO TABS: ORAL_TABLET | ORAL | Status: DC

## 2012-10-28 ENCOUNTER — Ambulatory Visit (INDEPENDENT_AMBULATORY_CARE_PROVIDER_SITE_OTHER): Payer: BC Managed Care – PPO | Admitting: Internal Medicine

## 2012-10-28 ENCOUNTER — Encounter: Payer: Self-pay | Admitting: Internal Medicine

## 2012-10-28 VITALS — BP 114/75 | HR 52 | Ht 72.0 in | Wt 196.8 lb

## 2012-10-28 DIAGNOSIS — I4891 Unspecified atrial fibrillation: Secondary | ICD-10-CM

## 2012-10-28 DIAGNOSIS — I10 Essential (primary) hypertension: Secondary | ICD-10-CM

## 2012-10-28 DIAGNOSIS — I4819 Other persistent atrial fibrillation: Secondary | ICD-10-CM

## 2012-10-28 NOTE — Patient Instructions (Addendum)
Your physician wants you to follow-up in: 6 months with Dr. Allred. You will receive a reminder letter in the mail two months in advance. If you don't receive a letter, please call our office to schedule the follow-up appointment.  

## 2012-10-28 NOTE — Progress Notes (Signed)
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   Today, he denies symptoms of palpitations, chest pain,   lower extremity edema, dizziness, presyncope, or syncope.  He reports ED for which he takes cialis.  He finds this helpful.  The patient is otherwise without complaint today.   Past Medical History  Diagnosis Date  . Paroxysmal atrial fibrillation 12/15/2007  . CHEST PAIN-PRECORDIAL 05/25/2009  . ERECTILE DYSFUNCTION, ORGANIC 01/16/2010  . HYPERLIPIDEMIA 12/15/2007  . HYPERTENSION 12/15/2007  . HYPOKALEMIA 12/15/2007  . POSTURAL LIGHTHEADEDNESS 07/06/2008  . Atrial flutter   . Internal hemorrhoids    Past Surgical History  Procedure Laterality Date  . Inguinal herniorrhapy  6/08  . Hernia repair    . Elbow surgery  bone spur  . Tee without cardioversion  08/12/2011    Procedure: TRANSESOPHAGEAL ECHOCARDIOGRAM (TEE);  Surgeon: Pricilla Riffle, MD;  Location: [facility] ENDOSCOPY;  Service: Cardiovascular;  Laterality: N/A;  . Atrial fibrillation and atrial flutter ablation  08/13/11    PVI and CTI ablation by Dr Johney Frame  . Cardioversion  11/21/2011    Procedure: CARDIOVERSION;  Surgeon: Laurey Morale, MD;  Location: [facility] OR;  Service: Cardiovascular;  Laterality: N/A;  . Ablation of dysrhythmic focus  12/27/2011    CTI repeat ablation by Dr Johney Frame    Current Outpatient Prescriptions  Medication Sig Dispense Refill  . amLODipine (NORVASC) 2.5 MG tablet Take 1 tablet (2.5 mg total) by mouth daily.  30 tablet  6  . atorvastatin (LIPITOR) 20 MG tablet TAKE 1 TABLET BY MOUTH EVERY DAY  30 tablet  11  . B Complex-C (B-COMPLEX WITH VITAMIN C) tablet Take 1 tablet by mouth daily.      Marland Kitchen CIALIS 20 MG tablet TAKE 1 TABLET ONCE DAILY AS NEEDED  5 tablet  5  . diltiazem (CARDIZEM) 30 MG tablet Take one tablet every 6 hours as needed for elevated  heart rate  30 tablet  12  . doxazosin (CARDURA) 4 MG tablet Take 4 mg by mouth at bedtime.      . flecainide (TAMBOCOR) 100 MG tablet Take 1 tablet (100 mg total) by mouth 2 (two) times daily. Please resume prior home prescription as prescribed here.      . irbesartan (AVAPRO) 300 MG tablet Take 1 tablet (300 mg total) by mouth at bedtime.  90 tablet  3  . pantoprazole (PROTONIX) 40 MG tablet TAKE 1 TABLET EVERY DAY  30 tablet  6  . warfarin (COUMADIN) 5 MG tablet Take as directed by anticoagulation clinic  45 tablet  1   No current facility-administered medications for this visit.    Physical Exam: Filed Vitals:   10/28/12 0942  BP: 114/75  Pulse: 52  Height: 6' (1.829 m)  Weight: 196 lb 12.8 oz (89.268 kg)    GEN- The patient is well appearing, alert and oriented x 3 today.   Head- normocephalic, atraumatic Eyes-  Sclera clear, conjunctiva pink Ears- hearing intact Oropharynx- clear Lungs- Clear to ausculation bilaterally, normal work of breathing Heart- regular rate and rhythm, no murmurs, rubs or gallops, PMI not laterally displaced GI- soft, NT, ND, + BS Extremities- no clubbing, cyanosis, or edema  ekg today reveals sinus bradycardia 52 bpm, otherwise normal ekg  Assessment and Plan:  1. afib Well controlled No changes chads2vasc score is 1.  Consider stopping coumadin if his arrhythmias are controlled upon return in 6 months  2. HTN Stable No change required today  Return in 6 months

## 2012-11-04 ENCOUNTER — Other Ambulatory Visit: Payer: Self-pay | Admitting: *Deleted

## 2012-11-04 MED ORDER — PANTOPRAZOLE SODIUM 40 MG PO TBEC: 40.0000 mg | DELAYED_RELEASE_TABLET | Freq: Every day | ORAL | Status: DC

## 2012-11-05 ENCOUNTER — Other Ambulatory Visit: Payer: Self-pay | Admitting: *Deleted

## 2012-11-05 MED ORDER — PANTOPRAZOLE SODIUM 40 MG PO TBEC: 40.0000 mg | DELAYED_RELEASE_TABLET | Freq: Every day | ORAL | Status: DC

## 2012-12-15 ENCOUNTER — Other Ambulatory Visit: Payer: Self-pay | Admitting: Internal Medicine

## 2012-12-16 ENCOUNTER — Ambulatory Visit (INDEPENDENT_AMBULATORY_CARE_PROVIDER_SITE_OTHER): Payer: BC Managed Care – PPO | Admitting: Pharmacist

## 2012-12-16 DIAGNOSIS — I4819 Other persistent atrial fibrillation: Secondary | ICD-10-CM

## 2012-12-16 DIAGNOSIS — I4891 Unspecified atrial fibrillation: Secondary | ICD-10-CM

## 2012-12-16 MED ORDER — TADALAFIL 20 MG PO TABS: ORAL_TABLET | ORAL | Status: DC

## 2013-01-20 ENCOUNTER — Ambulatory Visit (INDEPENDENT_AMBULATORY_CARE_PROVIDER_SITE_OTHER): Payer: BC Managed Care – PPO | Admitting: *Deleted

## 2013-01-20 ENCOUNTER — Other Ambulatory Visit: Payer: Self-pay | Admitting: Internal Medicine

## 2013-01-20 DIAGNOSIS — I4819 Other persistent atrial fibrillation: Secondary | ICD-10-CM

## 2013-01-20 DIAGNOSIS — I4891 Unspecified atrial fibrillation: Secondary | ICD-10-CM

## 2013-01-20 LAB — POCT INR: INR: 2.4

## 2013-02-24 ENCOUNTER — Ambulatory Visit (INDEPENDENT_AMBULATORY_CARE_PROVIDER_SITE_OTHER): Payer: BC Managed Care – PPO | Admitting: *Deleted

## 2013-02-24 DIAGNOSIS — I4819 Other persistent atrial fibrillation: Secondary | ICD-10-CM

## 2013-02-24 DIAGNOSIS — I4891 Unspecified atrial fibrillation: Secondary | ICD-10-CM

## 2013-03-20 ENCOUNTER — Other Ambulatory Visit: Payer: Self-pay | Admitting: Internal Medicine

## 2013-03-22 NOTE — Telephone Encounter (Signed)
Refill done.  

## 2013-03-31 ENCOUNTER — Ambulatory Visit (INDEPENDENT_AMBULATORY_CARE_PROVIDER_SITE_OTHER): Payer: BC Managed Care – PPO | Admitting: Pharmacist

## 2013-03-31 DIAGNOSIS — I4891 Unspecified atrial fibrillation: Secondary | ICD-10-CM

## 2013-03-31 DIAGNOSIS — I4819 Other persistent atrial fibrillation: Secondary | ICD-10-CM

## 2013-03-31 LAB — POCT INR: INR: 1.7

## 2013-04-08 ENCOUNTER — Telehealth: Payer: Self-pay

## 2013-04-08 ENCOUNTER — Encounter: Payer: BC Managed Care – PPO | Admitting: Internal Medicine

## 2013-04-08 DIAGNOSIS — Z0000 Encounter for general adult medical examination without abnormal findings: Secondary | ICD-10-CM

## 2013-04-08 NOTE — Telephone Encounter (Signed)
cpx labs entered  

## 2013-04-23 ENCOUNTER — Other Ambulatory Visit: Payer: Self-pay

## 2013-04-23 MED ORDER — AMLODIPINE BESYLATE 2.5 MG PO TABS: 2.5000 mg | ORAL_TABLET | Freq: Every day | ORAL | Status: DC

## 2013-05-20 ENCOUNTER — Other Ambulatory Visit: Payer: Self-pay | Admitting: Internal Medicine

## 2013-06-19 ENCOUNTER — Other Ambulatory Visit: Payer: Self-pay | Admitting: Internal Medicine

## 2013-06-22 ENCOUNTER — Encounter: Payer: BC Managed Care – PPO | Admitting: Internal Medicine

## 2013-06-26 ENCOUNTER — Other Ambulatory Visit: Payer: Self-pay | Admitting: Internal Medicine

## 2013-07-02 ENCOUNTER — Encounter: Payer: Self-pay | Admitting: Internal Medicine

## 2013-07-09 ENCOUNTER — Other Ambulatory Visit: Payer: Self-pay | Admitting: Internal Medicine

## 2013-07-13 ENCOUNTER — Encounter: Payer: Self-pay | Admitting: Internal Medicine

## 2013-07-13 ENCOUNTER — Telehealth: Payer: Self-pay | Admitting: Internal Medicine

## 2013-07-13 ENCOUNTER — Ambulatory Visit (INDEPENDENT_AMBULATORY_CARE_PROVIDER_SITE_OTHER): Payer: BC Managed Care – PPO | Admitting: General Practice

## 2013-07-13 ENCOUNTER — Other Ambulatory Visit (INDEPENDENT_AMBULATORY_CARE_PROVIDER_SITE_OTHER): Payer: BC Managed Care – PPO

## 2013-07-13 ENCOUNTER — Ambulatory Visit (INDEPENDENT_AMBULATORY_CARE_PROVIDER_SITE_OTHER): Payer: BC Managed Care – PPO | Admitting: Internal Medicine

## 2013-07-13 ENCOUNTER — Other Ambulatory Visit: Payer: Self-pay | Admitting: General Practice

## 2013-07-13 VITALS — BP 140/78 | HR 68 | Temp 98.0°F | Ht 72.0 in | Wt 201.2 lb

## 2013-07-13 DIAGNOSIS — I4819 Other persistent atrial fibrillation: Secondary | ICD-10-CM

## 2013-07-13 DIAGNOSIS — I4891 Unspecified atrial fibrillation: Secondary | ICD-10-CM

## 2013-07-13 DIAGNOSIS — Z5181 Encounter for therapeutic drug level monitoring: Secondary | ICD-10-CM

## 2013-07-13 DIAGNOSIS — Z0000 Encounter for general adult medical examination without abnormal findings: Secondary | ICD-10-CM

## 2013-07-13 LAB — CBC WITH DIFFERENTIAL/PLATELET
BASOS ABS: 0 10*3/uL (ref 0.0–0.1)
Basophils Relative: 0.2 % (ref 0.0–3.0)
EOS PCT: 3.6 % (ref 0.0–5.0)
Eosinophils Absolute: 0.3 10*3/uL (ref 0.0–0.7)
HEMATOCRIT: 44.4 % (ref 39.0–52.0)
HEMOGLOBIN: 14.6 g/dL (ref 13.0–17.0)
LYMPHS ABS: 2.2 10*3/uL (ref 0.7–4.0)
Lymphocytes Relative: 24.5 % (ref 12.0–46.0)
MCHC: 32.9 g/dL (ref 30.0–36.0)
MCV: 90.8 fl (ref 78.0–100.0)
MONO ABS: 0.8 10*3/uL (ref 0.1–1.0)
MONOS PCT: 9.1 % (ref 3.0–12.0)
NEUTROS ABS: 5.7 10*3/uL (ref 1.4–7.7)
Neutrophils Relative %: 62.6 % (ref 43.0–77.0)
PLATELETS: 223 10*3/uL (ref 150.0–400.0)
RBC: 4.89 Mil/uL (ref 4.22–5.81)
RDW: 13.2 % (ref 11.5–14.6)
WBC: 9.1 10*3/uL (ref 4.5–10.5)

## 2013-07-13 LAB — URINALYSIS, ROUTINE W REFLEX MICROSCOPIC
BILIRUBIN URINE: NEGATIVE
KETONES UR: NEGATIVE
Leukocytes, UA: NEGATIVE
Nitrite: NEGATIVE
PH: 5.5 (ref 5.0–8.0)
Specific Gravity, Urine: 1.025 (ref 1.000–1.030)
Total Protein, Urine: NEGATIVE
Urine Glucose: NEGATIVE
Urobilinogen, UA: 0.2 (ref 0.0–1.0)

## 2013-07-13 LAB — BASIC METABOLIC PANEL
BUN: 17 mg/dL (ref 6–23)
CHLORIDE: 106 meq/L (ref 96–112)
CO2: 25 mEq/L (ref 19–32)
Calcium: 9.2 mg/dL (ref 8.4–10.5)
Creatinine, Ser: 1.1 mg/dL (ref 0.4–1.5)
GFR: 86.12 mL/min (ref >60.00)
GLUCOSE: 105 mg/dL — AB (ref 70–99)
POTASSIUM: 4 meq/L (ref 3.5–5.1)
SODIUM: 138 meq/L (ref 135–145)

## 2013-07-13 LAB — LIPID PANEL
Cholesterol: 258 mg/dL — ABNORMAL HIGH (ref 0–200)
HDL: 51.6 mg/dL (ref >39.00)
Total CHOL/HDL Ratio: 5
Triglycerides: 275 mg/dL — ABNORMAL HIGH (ref 0.0–149.0)
VLDL: 55 mg/dL — ABNORMAL HIGH (ref 0.0–40.0)

## 2013-07-13 LAB — POCT INR: INR: 2.4

## 2013-07-13 LAB — HEPATIC FUNCTION PANEL
ALBUMIN: 4.1 g/dL (ref 3.5–5.2)
ALK PHOS: 56 U/L (ref 39–117)
ALT: 20 U/L (ref 0–53)
AST: 17 U/L (ref 0–37)
Bilirubin, Direct: 0.1 mg/dL (ref 0.0–0.3)
Total Bilirubin: 1.1 mg/dL (ref 0.3–1.2)
Total Protein: 7.2 g/dL (ref 6.0–8.3)

## 2013-07-13 LAB — LDL CHOLESTEROL, DIRECT: LDL DIRECT: 115.7 mg/dL

## 2013-07-13 LAB — TSH: TSH: 0.97 u[IU]/mL (ref 0.35–5.50)

## 2013-07-13 LAB — PSA: PSA: 0.53 ng/mL (ref 0.10–4.00)

## 2013-07-13 MED ORDER — DOXAZOSIN MESYLATE 4 MG PO TABS: 4.0000 mg | ORAL_TABLET | Freq: Every day | ORAL | Status: DC

## 2013-07-13 MED ORDER — ATORVASTATIN CALCIUM 20 MG PO TABS: 20.0000 mg | ORAL_TABLET | Freq: Every day | ORAL | Status: DC

## 2013-07-13 MED ORDER — PANTOPRAZOLE SODIUM 40 MG PO TBEC: 40.0000 mg | DELAYED_RELEASE_TABLET | Freq: Every day | ORAL | Status: DC

## 2013-07-13 MED ORDER — IRBESARTAN 300 MG PO TABS: ORAL_TABLET | ORAL | Status: DC

## 2013-07-13 MED ORDER — WARFARIN SODIUM 5 MG PO TABS: ORAL_TABLET | ORAL | Status: DC

## 2013-07-13 MED ORDER — DILTIAZEM HCL 30 MG PO TABS: ORAL_TABLET | ORAL | Status: DC

## 2013-07-13 MED ORDER — TADALAFIL 20 MG PO TABS: ORAL_TABLET | ORAL | Status: DC

## 2013-07-13 MED ORDER — AMLODIPINE BESYLATE 2.5 MG PO TABS: 2.5000 mg | ORAL_TABLET | Freq: Every day | ORAL | Status: DC

## 2013-07-13 NOTE — Assessment & Plan Note (Signed)
Took last dose coumadin yest, lost to f/u since oct 2014 at [facility], since here will ask to see our clinic today to get back "in the system" and get a refill as he has none further

## 2013-07-13 NOTE — Progress Notes (Signed)
Subjective:    Patient ID: [name] [name], male    DOB: 1961/01/24, 53 y.o.   MRN: [id number]  HPI  Here for wellness and f/u;  Overall doing ok;  Pt denies CP, worsening SOB, DOE, wheezing, orthopnea, PND, worsening LE edema, palpitations, dizziness or syncope.  Pt denies neurological change such as new headache, facial or extremity weakness.  Pt denies polydipsia, polyuria, or low sugar symptoms. Pt states overall good compliance with treatment and medications, good tolerability, and has been trying to follow lower cholesterol diet.  Pt denies worsening depressive symptoms, suicidal ideation or panic. No fever, night sweats, wt loss, loss of appetite, or other constitutional symptoms.  Pt states good ability with ADL's, has low fall risk, home safety reviewed and adequate, no other significant changes in hearing or vision, and only occasionally active with exercise.  BP at work ave 130/78, out of meds for one wk, due to insurance issues. Also asks for cialis samples and rx.  Past Medical History  Diagnosis Date  . Paroxysmal atrial fibrillation 12/15/2007  . CHEST PAIN-PRECORDIAL 05/25/2009  . ERECTILE DYSFUNCTION, ORGANIC 01/16/2010  . HYPERLIPIDEMIA 12/15/2007  . HYPERTENSION 12/15/2007  . HYPOKALEMIA 12/15/2007  . POSTURAL LIGHTHEADEDNESS 07/06/2008  . Atrial flutter   . Internal hemorrhoids    Past Surgical History  Procedure Laterality Date  . Inguinal herniorrhapy  6/08  . Hernia repair    . Elbow surgery  bone spur  . Tee without cardioversion  08/12/2011    Procedure: TRANSESOPHAGEAL ECHOCARDIOGRAM (TEE);  Surgeon: Fay Records, MD;  Location: [facility] ENDOSCOPY;  Service: Cardiovascular;  Laterality: N/A;  . Atrial fibrillation and atrial flutter ablation  08/13/11    PVI and CTI ablation by Dr Rayann Heman  . Cardioversion  11/21/2011    Procedure: CARDIOVERSION;  Surgeon: Larey Dresser, MD;  Location: [place];  Service: Cardiovascular;  Laterality: N/A;  . Ablation of dysrhythmic focus   12/27/2011    CTI repeat ablation by Dr Rayann Heman    reports that he quit smoking about 2 years ago. His smoking use included Cigarettes. He has a 4.5 pack-year smoking history. He has never used smokeless tobacco. He reports that he drinks about 0.6 ounces of alcohol per week. He reports that he does not use illicit drugs. family history includes Heart disease in his sister; Sleep apnea in his sister; Stroke in his mother; Thyroid disease in his sister. There is no history of Anesthesia problems. Allergies  Allergen Reactions  . Ibuprofen Other (See Comments)    REACTION: rapid heart beat   Current Outpatient Prescriptions on File Prior to Visit  Medication Sig Dispense Refill  . amLODipine (NORVASC) 2.5 MG tablet Take 1 tablet (2.5 mg total) by mouth daily.  30 tablet  3  . B Complex-C (B-COMPLEX WITH VITAMIN C) tablet Take 1 tablet by mouth daily.      Marland Kitchen CIALIS 20 MG tablet TAKE 1 TABLET ONCE DAILY AS NEEDED  5 tablet  5  . diltiazem (CARDIZEM) 30 MG tablet Take one tablet every 6 hours as needed for elevated heart rate  30 tablet  12  . doxazosin (CARDURA) 4 MG tablet Take 4 mg by mouth at bedtime.      . flecainide (TAMBOCOR) 100 MG tablet TAKE 1&1/2 TABLETS BY MOUTH TWICE DAILY  270 tablet  2  . irbesartan (AVAPRO) 300 MG tablet TAKE 1 TABLET AT BEDTIME  90 tablet  1  . pantoprazole (PROTONIX) 40 MG tablet Take 1 tablet (  40 mg total) by mouth daily.  90 tablet  3  . tadalafil (CIALIS) 20 MG tablet TAKE 1 TABLET ONCE DAILY AS NEEDED  3 tablet  0  . warfarin (COUMADIN) 5 MG tablet TAKE AS DIRECTED BY ANTICOAGULATION CLINIC  135 tablet  1   No current facility-administered medications on file prior to visit.    Review of Systems Constitutional: Negative for diaphoresis, activity change, appetite change or unexpected weight change.  HENT: Negative for hearing loss, ear pain, facial swelling, mouth sores and neck stiffness.   Eyes: Negative for pain, redness and visual disturbance.    Respiratory: Negative for shortness of breath and wheezing.   Cardiovascular: Negative for chest pain and palpitations.  Gastrointestinal: Negative for diarrhea, blood in stool, abdominal distention or other pain Genitourinary: Negative for hematuria, flank pain or change in urine volume.  Musculoskeletal: Negative for myalgias and joint swelling.  Skin: Negative for color change and wound.  Neurological: Negative for syncope and numbness. other than noted Hematological: Negative for adenopathy.  Psychiatric/Behavioral: Negative for hallucinations, self-injury, decreased concentration and agitation.      Objective:   Physical Exam BP 140/78  Pulse 68  Temp(Src) 98 F (36.7 C) (Oral)  Ht 6' (1.829 m)  Wt 201 lb 4 oz (91.286 kg)  BMI 27.29 kg/m2  SpO2 98% VS noted,  Constitutional: Pt is oriented to person, place, and time. Appears well-developed and well-nourished.  Head: Normocephalic and atraumatic.  Right Ear: External ear normal.  Left Ear: External ear normal.  Nose: Nose normal.  Mouth/Throat: Oropharynx is clear and moist.  Eyes: Conjunctivae and EOM are normal. Pupils are equal, round, and reactive to light.  Neck: Normal range of motion. Neck supple. No JVD present. No tracheal deviation present.  Cardiovascular: Normal rate, regular rhythm, normal heart sounds and intact distal pulses.   Pulmonary/Chest: Effort normal and breath sounds normal.  Abdominal: Soft. Bowel sounds are normal. There is no tenderness. No HSM  Musculoskeletal: Normal range of motion. Exhibits no edema.  Lymphadenopathy:  Has no cervical adenopathy.  Neurological: Pt is alert and oriented to person, place, and time. Pt has normal reflexes. No cranial nerve deficit.  Skin: Skin is warm and dry. No rash noted.  Psychiatric:  Has  normal mood and affect. Behavior is normal.     Assessment & Plan:

## 2013-07-13 NOTE — Patient Instructions (Signed)
Please continue all other medications as before, and refills have been done if requested. Please have the pharmacy call with any other refills you may need.  You will be contacted regarding the referral for: Dr Rayann Heman, as you are overdue  Please wait in the [place] waiting area to be seen by [facility] for Coumadin clinic this morning  Please go to the LAB in the Basement (turn left off the elevator) for the tests to be done today You will be contacted by phone if any changes need to be made immediately.  Otherwise, you will receive a letter about your results with an explanation, but please check with MyChart first.  Please remember to sign up for MyChart if you have not done so, as this will be important to you in the future with finding out test results, communicating by private email, and scheduling acute appointments online when needed.  Please return in 1 year for your yearly visit, or sooner if needed, with Lab testing done 3-5 days before

## 2013-07-13 NOTE — Assessment & Plan Note (Signed)
I asked pt to bring up the idea of change coumadin to eliquis as has some difficulty recently with keeping up with the INR check and Dr Rayann Heman; will defer and refer to Dr Allred/card

## 2013-07-13 NOTE — Progress Notes (Signed)
Pre-visit discussion using our clinic review tool. No additional management support is needed unless otherwise documented below in the visit note.  

## 2013-07-13 NOTE — Telephone Encounter (Signed)
Called the patient back and he just needed confirmed what had been refilled today.

## 2013-07-13 NOTE — Assessment & Plan Note (Signed)

## 2013-07-13 NOTE — Telephone Encounter (Signed)
07/13/2013  Patient is a very urgent medication concern regarding discontinued meds.   Please contact asap.

## 2013-07-15 ENCOUNTER — Encounter: Payer: BC Managed Care – PPO | Admitting: Physician Assistant

## 2013-07-15 NOTE — Progress Notes (Signed)
This encounter was created in error - please disregard.

## 2013-07-15 NOTE — Progress Notes (Deleted)
[address] [place], [place]  [address] Phone: [phone] Fax:  [phone]  Date:  07/15/2013   ID:  [name] [name], DOB May 22, 1961, MRN [id number]  PCP:  Cathlean Cower, MD  Cardiologist:  Dr. Thompson Grayer     History of Present Illness: [name] [name] is a 53 y.o. male with a history of paroxysmal atrial fibrillation, HTN, HL. He has failed Flecainide and cardioversion in the past and has undergone PVI and CTI ablations with Dr. Rayann Heman. Last seen by Dr. Rayann Heman 10/2012.  CHADS2-VASc=***  Echocardiogram (07/18/11): Mild LVH, EF 55-60%, no WMA, mild MR, mild RVE, mildly reduced RVSF, mild RAE.  ***  Recent Labs: 07/13/2013: ALT 20; Creatinine 1.1; Direct LDL 115.7; HDL Cholesterol 51.60; Hemoglobin 14.6; Potassium 4.0; TSH 0.97   Wt Readings from Last 3 Encounters:  07/13/13 201 lb 4 oz (91.286 kg)  10/28/12 196 lb 12.8 oz (89.268 kg)  07/29/12 188 lb 6.4 oz (85.458 kg)     Past Medical History  Diagnosis Date  . Paroxysmal atrial fibrillation 12/15/2007  . CHEST PAIN-PRECORDIAL 05/25/2009  . ERECTILE DYSFUNCTION, ORGANIC 01/16/2010  . HYPERLIPIDEMIA 12/15/2007  . HYPERTENSION 12/15/2007  . HYPOKALEMIA 12/15/2007  . POSTURAL LIGHTHEADEDNESS 07/06/2008  . Atrial flutter   . Internal hemorrhoids     Current Outpatient Prescriptions  Medication Sig Dispense Refill  . amLODipine (NORVASC) 2.5 MG tablet Take 1 tablet (2.5 mg total) by mouth daily.  90 tablet  3  . atorvastatin (LIPITOR) 20 MG tablet Take 1 tablet (20 mg total) by mouth daily.  90 tablet  3  . B Complex-C (B-COMPLEX WITH VITAMIN C) tablet Take 1 tablet by mouth daily.      Marland Kitchen diltiazem (CARDIZEM) 30 MG tablet Take one tablet every 6 hours as needed for elevated heart rate  30 tablet  12  . doxazosin (CARDURA) 4 MG tablet Take 1 tablet (4 mg total) by mouth at bedtime.  90 tablet  3  . flecainide (TAMBOCOR) 100 MG tablet TAKE 1&1/2 TABLETS BY MOUTH TWICE DAILY  270 tablet  2  . irbesartan (AVAPRO) 300 MG  tablet TAKE 1 TABLET every am  90 tablet  3  . pantoprazole (PROTONIX) 40 MG tablet Take 1 tablet (40 mg total) by mouth daily.  90 tablet  3  . tadalafil (CIALIS) 20 MG tablet TAKE 1 TABLET ONCE DAILY AS NEEDED  3 tablet  0  . tadalafil (CIALIS) 20 MG tablet TAKE 1 TABLET ONCE DAILY AS NEEDED  4 tablet  11  . warfarin (COUMADIN) 5 MG tablet Take as directed  135 tablet  0   No current facility-administered medications for this visit.    Allergies:   Ibuprofen   Social History:  The patient  reports that he quit smoking about 2 years ago. His smoking use included Cigarettes. He has a 4.5 pack-year smoking history. He has never used smokeless tobacco. He reports that he drinks about 0.6 ounces of alcohol per week. He reports that he does not use illicit drugs.   Family History:  The patient's family history includes Heart disease in his sister; Sleep apnea in his sister; Stroke in his mother; Thyroid disease in his sister. There is no history of Anesthesia problems.   ROS:  Please see the history of present illness.   ***   All other systems reviewed and negative.   PHYSICAL EXAM: VS:  There were no vitals taken for this visit. Well nourished, well developed, in  no acute distress HEENT: normal Neck: no JVD Cardiac:  normal S1, S2; RRR; no murmur Lungs:  clear to auscultation bilaterally, no wheezing, rhonchi or rales Abd: soft, nontender, no hepatomegaly Ext: no edema Skin: warm and dry Neuro:  CNs 2-12 intact, no focal abnormalities noted  EKG:  ***     ASSESSMENT AND PLAN:  1. Atrial Fibrillation:  *** 2. Hypertension:  *** 3. Disposition:  ***  Signed, Richardson Dopp, PA-C  07/15/2013 8:57 AM   This encounter was created in error - please disregard.

## 2013-07-16 ENCOUNTER — Encounter: Payer: Self-pay | Admitting: Physician Assistant

## 2013-07-28 ENCOUNTER — Encounter: Payer: BC Managed Care – PPO | Admitting: Internal Medicine

## 2013-08-11 ENCOUNTER — Ambulatory Visit (INDEPENDENT_AMBULATORY_CARE_PROVIDER_SITE_OTHER): Payer: BC Managed Care – PPO | Admitting: *Deleted

## 2013-08-11 DIAGNOSIS — Z5181 Encounter for therapeutic drug level monitoring: Secondary | ICD-10-CM

## 2013-08-11 DIAGNOSIS — I4819 Other persistent atrial fibrillation: Secondary | ICD-10-CM

## 2013-08-11 DIAGNOSIS — I4891 Unspecified atrial fibrillation: Secondary | ICD-10-CM

## 2013-08-11 LAB — POCT INR: INR: 2.4

## 2013-09-05 ENCOUNTER — Other Ambulatory Visit: Payer: Self-pay | Admitting: Internal Medicine

## 2013-09-09 ENCOUNTER — Ambulatory Visit (INDEPENDENT_AMBULATORY_CARE_PROVIDER_SITE_OTHER): Payer: BC Managed Care – PPO | Admitting: Pharmacist Clinician (PhC)/ Clinical Pharmacy Specialist

## 2013-09-09 DIAGNOSIS — I4819 Other persistent atrial fibrillation: Secondary | ICD-10-CM

## 2013-09-09 DIAGNOSIS — Z5181 Encounter for therapeutic drug level monitoring: Secondary | ICD-10-CM

## 2013-09-09 DIAGNOSIS — I4891 Unspecified atrial fibrillation: Secondary | ICD-10-CM

## 2013-09-09 LAB — POCT INR: INR: 1.8

## 2013-10-07 ENCOUNTER — Ambulatory Visit (INDEPENDENT_AMBULATORY_CARE_PROVIDER_SITE_OTHER): Payer: BC Managed Care – PPO

## 2013-10-07 DIAGNOSIS — I4891 Unspecified atrial fibrillation: Secondary | ICD-10-CM

## 2013-10-07 DIAGNOSIS — Z5181 Encounter for therapeutic drug level monitoring: Secondary | ICD-10-CM

## 2013-10-07 DIAGNOSIS — I4819 Other persistent atrial fibrillation: Secondary | ICD-10-CM

## 2013-10-07 LAB — POCT INR: INR: 2.6

## 2013-10-14 ENCOUNTER — Other Ambulatory Visit: Payer: Self-pay | Admitting: Internal Medicine

## 2013-11-02 ENCOUNTER — Other Ambulatory Visit: Payer: Self-pay | Admitting: Internal Medicine

## 2013-11-02 NOTE — Telephone Encounter (Signed)
Kim, can you please look at the dose on this? The last ov directions do not match the rx request nor the way it was last sent in. Thanks, MI

## 2014-01-11 ENCOUNTER — Other Ambulatory Visit: Payer: Self-pay | Admitting: Internal Medicine

## 2014-01-14 ENCOUNTER — Ambulatory Visit (INDEPENDENT_AMBULATORY_CARE_PROVIDER_SITE_OTHER): Payer: BC Managed Care – PPO | Admitting: *Deleted

## 2014-01-14 DIAGNOSIS — Z5181 Encounter for therapeutic drug level monitoring: Secondary | ICD-10-CM

## 2014-01-14 DIAGNOSIS — I4819 Other persistent atrial fibrillation: Secondary | ICD-10-CM

## 2014-01-14 DIAGNOSIS — I4891 Unspecified atrial fibrillation: Secondary | ICD-10-CM

## 2014-01-14 LAB — POCT INR: INR: 3.7

## 2014-02-02 ENCOUNTER — Ambulatory Visit (INDEPENDENT_AMBULATORY_CARE_PROVIDER_SITE_OTHER): Payer: BC Managed Care – PPO | Admitting: *Deleted

## 2014-02-02 ENCOUNTER — Ambulatory Visit (INDEPENDENT_AMBULATORY_CARE_PROVIDER_SITE_OTHER): Payer: BC Managed Care – PPO | Admitting: Internal Medicine

## 2014-02-02 ENCOUNTER — Encounter: Payer: Self-pay | Admitting: Internal Medicine

## 2014-02-02 VITALS — BP 140/100 | HR 84 | Ht 72.0 in | Wt 191.0 lb

## 2014-02-02 DIAGNOSIS — I10 Essential (primary) hypertension: Secondary | ICD-10-CM

## 2014-02-02 DIAGNOSIS — I4891 Unspecified atrial fibrillation: Secondary | ICD-10-CM

## 2014-02-02 DIAGNOSIS — Z5181 Encounter for therapeutic drug level monitoring: Secondary | ICD-10-CM

## 2014-02-02 DIAGNOSIS — I4819 Other persistent atrial fibrillation: Secondary | ICD-10-CM

## 2014-02-02 LAB — POCT INR: INR: 3.1

## 2014-02-02 NOTE — Progress Notes (Signed)
PCP: Cathlean Cower, MD Primary Cardiologist:  Dr Rosiland Oz is a 53 y.o. male who presents today for routine electrophysiology followup.He reports feeling irregular heart beat intermittently and ekg does show afib, rate controlled. Pt got confused with his meds and has been taking flecainide 100 mg only one x a day. However he is very active and has good stamina. He exercises regularly and has lost weight which has helped his history of mild sleep apnea. He continues on warfarin. BP initially elevated at 140/100, BP 138/84 on recheck. He is concerned that after his weight loss he has been difficult to gain weight. Will check a TSH with next coumadin lab.  Past Medical History  Diagnosis Date  . Paroxysmal atrial fibrillation 12/15/2007  . CHEST PAIN-PRECORDIAL 05/25/2009  . ERECTILE DYSFUNCTION, ORGANIC 01/16/2010  . HYPERLIPIDEMIA 12/15/2007  . HYPERTENSION 12/15/2007  . HYPOKALEMIA 12/15/2007  . POSTURAL LIGHTHEADEDNESS 07/06/2008  . Atrial flutter   . Internal hemorrhoids    Past Surgical History  Procedure Laterality Date  . Inguinal herniorrhapy  6/08  . Hernia repair    . Elbow surgery  bone spur  . Tee without cardioversion  08/12/2011    Procedure: TRANSESOPHAGEAL ECHOCARDIOGRAM (TEE);  Surgeon: Fay Records, MD;  Location: [facility] ENDOSCOPY;  Service: Cardiovascular;  Laterality: N/A;  . Atrial fibrillation and atrial flutter ablation  08/13/11    PVI and CTI ablation by Dr Rayann Heman  . Cardioversion  11/21/2011    Procedure: CARDIOVERSION;  Surgeon: Larey Dresser, MD;  Location: [place];  Service: Cardiovascular;  Laterality: N/A;  . Ablation of dysrhythmic focus  12/27/2011    CTI repeat ablation by Dr Rayann Heman    Current Outpatient Prescriptions  Medication Sig Dispense Refill  . amLODipine (NORVASC) 2.5 MG tablet Take 1 tablet (2.5 mg total) by mouth daily.  90 tablet  3  . atorvastatin (LIPITOR) 20 MG tablet Take 1 tablet (20 mg total) by mouth daily.  90 tablet  3  .  diltiazem (CARDIZEM) 30 MG tablet Take one tablet every 6 hours as needed for elevated heart rate  30 tablet  12  . doxazosin (CARDURA) 4 MG tablet Take 1 tablet (4 mg total) by mouth at bedtime.  90 tablet  3  . flecainide (TAMBOCOR) 100 MG tablet 1 tablet daily      . irbesartan (AVAPRO) 300 MG tablet TAKE 1 TABLET every am  90 tablet  3  . pantoprazole (PROTONIX) 40 MG tablet Take 1 tablet (40 mg total) by mouth daily.  90 tablet  3  . sildenafil (REVATIO) 20 MG tablet Take 20 mg by mouth 3 (three) times daily.      . tadalafil (CIALIS) 20 MG tablet TAKE 1 TABLET ONCE DAILY AS NEEDED  4 tablet  11  . warfarin (COUMADIN) 5 MG tablet TAKE AS DIRECTED BY ANTICOAGULATION CLINIC  135 tablet  1   No current facility-administered medications for this visit.    Physical Exam: Filed Vitals:   02/02/14 1635  BP: 140/100  Pulse: 84  Height: 6' (1.829 m)  Weight: 86.637 kg (191 lb)    GEN- The patient is well appearing, alert and oriented x 3 today.   Head- normocephalic, atraumatic Eyes-  Sclera clear, conjunctiva pink Ears- hearing intact Oropharynx- clear Lungs- Clear to ausculation bilaterally, normal work of breathing Heart- Irregular rate and rhythm, no murmurs, rubs or gallops, PMI not laterally displaced GI- soft, NT, ND, + BS Extremities- no clubbing, cyanosis, or  edema  ekg today reveals afib at 69 bpm.  Assessment and Plan:  1.  Persistent afib Restart flecainide at 100 mg bid--> compliance encouraged. I also discussed ablation as an option today.  He would like to avoid this if possible. EKG in 3 weeks. Chads vasc score of 1, for now continue coumadin  2. HTN Stable Ok on recheck. No changes  3. Weight loss Beneficial for h/o mild  sleep apnea, which pt reports snoring less, but pt is concerned that weight is staying too low. TSH,Free T4 with next coumadin check.  F/U 6 months.

## 2014-02-02 NOTE — Patient Instructions (Addendum)
Your physician recommends that you schedule a follow-up appointment on February 23, 2014 for labs and EKG.   Your physician has recommended you make the following change in your medication:  1) Increase Flecainide to 100mg  twice daily.  Your physician wants you to follow-up in: 6 months with Dr. Rayann Heman. You will receive a reminder letter in the mail two months in advance. If you don't receive a letter, please call our office to schedule the follow-up appointment.

## 2014-02-23 ENCOUNTER — Ambulatory Visit (INDEPENDENT_AMBULATORY_CARE_PROVIDER_SITE_OTHER): Payer: BC Managed Care – PPO | Admitting: Pharmacist

## 2014-02-23 ENCOUNTER — Ambulatory Visit: Payer: BC Managed Care – PPO | Admitting: Interventional Cardiology

## 2014-02-23 ENCOUNTER — Other Ambulatory Visit (INDEPENDENT_AMBULATORY_CARE_PROVIDER_SITE_OTHER): Payer: BC Managed Care – PPO

## 2014-02-23 DIAGNOSIS — I4819 Other persistent atrial fibrillation: Secondary | ICD-10-CM

## 2014-02-23 DIAGNOSIS — Z5181 Encounter for therapeutic drug level monitoring: Secondary | ICD-10-CM

## 2014-02-23 DIAGNOSIS — I4891 Unspecified atrial fibrillation: Secondary | ICD-10-CM

## 2014-02-23 LAB — POCT INR: INR: 3.1

## 2014-02-23 NOTE — Progress Notes (Signed)
Patient in for nurse visit to have EKG. Patient completely asymptomatic and has no complaints.  Confirmed medications with patient. Completed EKG and took to DOD Irish Lack) for sign off. No new orders were given.  Routed to Drs Remo Lipps, and Jenny Reichmann.

## 2014-02-24 LAB — T4, FREE: FREE T4: 1.39 ng/dL (ref 0.60–1.60)

## 2014-02-24 LAB — TSH: TSH: 0.56 u[IU]/mL (ref 0.35–4.50)

## 2014-03-17 ENCOUNTER — Ambulatory Visit (INDEPENDENT_AMBULATORY_CARE_PROVIDER_SITE_OTHER): Payer: BC Managed Care – PPO | Admitting: Pharmacist

## 2014-03-17 ENCOUNTER — Encounter: Payer: Self-pay | Admitting: Internal Medicine

## 2014-03-17 DIAGNOSIS — I4819 Other persistent atrial fibrillation: Secondary | ICD-10-CM

## 2014-03-17 DIAGNOSIS — I4891 Unspecified atrial fibrillation: Secondary | ICD-10-CM

## 2014-03-17 DIAGNOSIS — I481 Persistent atrial fibrillation: Secondary | ICD-10-CM

## 2014-03-17 DIAGNOSIS — Z5181 Encounter for therapeutic drug level monitoring: Secondary | ICD-10-CM

## 2014-03-17 LAB — POCT INR: INR: 2.5

## 2014-04-14 ENCOUNTER — Ambulatory Visit (INDEPENDENT_AMBULATORY_CARE_PROVIDER_SITE_OTHER): Payer: BC Managed Care – PPO | Admitting: Pharmacist

## 2014-04-14 DIAGNOSIS — I481 Persistent atrial fibrillation: Secondary | ICD-10-CM

## 2014-04-14 DIAGNOSIS — Z5181 Encounter for therapeutic drug level monitoring: Secondary | ICD-10-CM

## 2014-04-14 DIAGNOSIS — I4891 Unspecified atrial fibrillation: Secondary | ICD-10-CM

## 2014-04-14 DIAGNOSIS — I4819 Other persistent atrial fibrillation: Secondary | ICD-10-CM

## 2014-04-14 LAB — POCT INR: INR: 2.8

## 2014-05-12 ENCOUNTER — Encounter (HOSPITAL_COMMUNITY): Payer: Self-pay | Admitting: Internal Medicine

## 2014-05-12 ENCOUNTER — Ambulatory Visit (INDEPENDENT_AMBULATORY_CARE_PROVIDER_SITE_OTHER): Payer: BC Managed Care – PPO

## 2014-05-12 DIAGNOSIS — Z5181 Encounter for therapeutic drug level monitoring: Secondary | ICD-10-CM

## 2014-05-12 DIAGNOSIS — I4819 Other persistent atrial fibrillation: Secondary | ICD-10-CM

## 2014-05-12 DIAGNOSIS — I4891 Unspecified atrial fibrillation: Secondary | ICD-10-CM

## 2014-05-12 DIAGNOSIS — I481 Persistent atrial fibrillation: Secondary | ICD-10-CM

## 2014-05-12 LAB — POCT INR: INR: 2.3

## 2014-05-12 MED ORDER — WARFARIN SODIUM 5 MG PO TABS: ORAL_TABLET | ORAL | Status: DC

## 2014-06-16 ENCOUNTER — Encounter (HOSPITAL_COMMUNITY): Payer: Self-pay | Admitting: Internal Medicine

## 2014-06-23 ENCOUNTER — Ambulatory Visit (INDEPENDENT_AMBULATORY_CARE_PROVIDER_SITE_OTHER): Payer: BLUE CROSS/BLUE SHIELD | Admitting: *Deleted

## 2014-06-23 DIAGNOSIS — I4819 Other persistent atrial fibrillation: Secondary | ICD-10-CM

## 2014-06-23 DIAGNOSIS — I481 Persistent atrial fibrillation: Secondary | ICD-10-CM

## 2014-06-23 DIAGNOSIS — I4891 Unspecified atrial fibrillation: Secondary | ICD-10-CM

## 2014-06-23 DIAGNOSIS — Z5181 Encounter for therapeutic drug level monitoring: Secondary | ICD-10-CM

## 2014-06-23 LAB — POCT INR: INR: 2.7

## 2014-07-07 ENCOUNTER — Other Ambulatory Visit: Payer: Self-pay | Admitting: Internal Medicine

## 2014-07-14 ENCOUNTER — Encounter: Payer: Self-pay | Admitting: Internal Medicine

## 2014-07-14 ENCOUNTER — Ambulatory Visit (INDEPENDENT_AMBULATORY_CARE_PROVIDER_SITE_OTHER): Payer: BLUE CROSS/BLUE SHIELD | Admitting: Internal Medicine

## 2014-07-14 ENCOUNTER — Other Ambulatory Visit (INDEPENDENT_AMBULATORY_CARE_PROVIDER_SITE_OTHER): Payer: BLUE CROSS/BLUE SHIELD

## 2014-07-14 VITALS — BP 138/80 | HR 53 | Temp 98.5°F | Ht 71.0 in | Wt 190.0 lb

## 2014-07-14 DIAGNOSIS — Z0000 Encounter for general adult medical examination without abnormal findings: Secondary | ICD-10-CM

## 2014-07-14 LAB — HEPATIC FUNCTION PANEL
ALBUMIN: 4.4 g/dL (ref 3.5–5.2)
ALT: 26 U/L (ref 0–53)
AST: 16 U/L (ref 0–37)
Alkaline Phosphatase: 62 U/L (ref 39–117)
Bilirubin, Direct: 0.2 mg/dL (ref 0.0–0.3)
Total Bilirubin: 0.9 mg/dL (ref 0.2–1.2)
Total Protein: 7.9 g/dL (ref 6.0–8.3)

## 2014-07-14 LAB — URINALYSIS, ROUTINE W REFLEX MICROSCOPIC
Bilirubin Urine: NEGATIVE
KETONES UR: NEGATIVE
Leukocytes, UA: NEGATIVE
Nitrite: NEGATIVE
Specific Gravity, Urine: >=1.03 — AB (ref 1.000–1.030)
Total Protein, Urine: NEGATIVE
URINE GLUCOSE: NEGATIVE
UROBILINOGEN UA: 0.2 (ref 0.0–1.0)
pH: 5.5 (ref 5.0–8.0)

## 2014-07-14 LAB — LIPID PANEL
CHOLESTEROL: 158 mg/dL (ref 0–200)
HDL: 65.9 mg/dL (ref >39.00)
LDL Cholesterol: 73 mg/dL (ref 0–99)
NonHDL: 92.1
Total CHOL/HDL Ratio: 2
Triglycerides: 96 mg/dL (ref 0.0–149.0)
VLDL: 19.2 mg/dL (ref 0.0–40.0)

## 2014-07-14 LAB — CBC WITH DIFFERENTIAL/PLATELET
BASOS PCT: 0.3 % (ref 0.0–3.0)
Basophils Absolute: 0 10*3/uL (ref 0.0–0.1)
Eosinophils Absolute: 0.2 10*3/uL (ref 0.0–0.7)
Eosinophils Relative: 2.6 % (ref 0.0–5.0)
HEMATOCRIT: 43 % (ref 39.0–52.0)
HEMOGLOBIN: 14.7 g/dL (ref 13.0–17.0)
LYMPHS ABS: 2.2 10*3/uL (ref 0.7–4.0)
Lymphocytes Relative: 23.9 % (ref 12.0–46.0)
MCHC: 34.1 g/dL (ref 30.0–36.0)
MCV: 87.7 fl (ref 78.0–100.0)
Monocytes Absolute: 0.9 10*3/uL (ref 0.1–1.0)
Monocytes Relative: 9.1 % (ref 3.0–12.0)
Neutro Abs: 6 10*3/uL (ref 1.4–7.7)
Neutrophils Relative %: 64.1 % (ref 43.0–77.0)
Platelets: 211 10*3/uL (ref 150.0–400.0)
RBC: 4.91 Mil/uL (ref 4.22–5.81)
RDW: 13.8 % (ref 11.5–15.5)
WBC: 9.4 10*3/uL (ref 4.0–10.5)

## 2014-07-14 LAB — BASIC METABOLIC PANEL
BUN: 23 mg/dL (ref 6–23)
CHLORIDE: 106 meq/L (ref 96–112)
CO2: 27 mEq/L (ref 19–32)
Calcium: 9.5 mg/dL (ref 8.4–10.5)
Creatinine, Ser: 1.03 mg/dL (ref 0.40–1.50)
GFR: 96.45 mL/min (ref >60.00)
GLUCOSE: 88 mg/dL (ref 70–99)
Potassium: 3.9 mEq/L (ref 3.5–5.1)
Sodium: 139 mEq/L (ref 135–145)

## 2014-07-14 LAB — TSH: TSH: 0.86 u[IU]/mL (ref 0.35–4.50)

## 2014-07-14 NOTE — Progress Notes (Signed)
Subjective:    Patient ID: [name] [name], male    DOB: 08-30-1960, 54 y.o.   MRN: [id number]  HPI Here for wellness and f/u;  Overall doing ok;  Pt denies CP, worsening SOB, DOE, wheezing, orthopnea, PND, worsening LE edema, palpitations, dizziness or syncope.  Pt denies neurological change such as new headache, facial or extremity weakness.  Pt denies polydipsia, polyuria, or low sugar symptoms. Pt states overall good compliance with treatment and medications, good tolerability, and has been trying to follow lower cholesterol diet.  Pt denies worsening depressive symptoms, suicidal ideation or panic. No fever, night sweats, wt loss, loss of appetite, or other constitutional symptoms.  Pt states good ability with ADL's, has low fall risk, home safety reviewed and adequate, no other significant changes in hearing or vision, and only occasionally active with exercise.  Has been somewhat forgetful more lately, but no other new complaints Past Medical History  Diagnosis Date  . Paroxysmal atrial fibrillation 12/15/2007  . CHEST PAIN-PRECORDIAL 05/25/2009  . ERECTILE DYSFUNCTION, ORGANIC 01/16/2010  . HYPERLIPIDEMIA 12/15/2007  . HYPERTENSION 12/15/2007  . HYPOKALEMIA 12/15/2007  . POSTURAL LIGHTHEADEDNESS 07/06/2008  . Atrial flutter   . Internal hemorrhoids    Past Surgical History  Procedure Laterality Date  . Inguinal herniorrhapy  6/08  . Hernia repair    . Elbow surgery  bone spur  . Tee without cardioversion  08/12/2011    Procedure: TRANSESOPHAGEAL ECHOCARDIOGRAM (TEE);  Surgeon: Fay Records, MD;  Location: [facility] ENDOSCOPY;  Service: Cardiovascular;  Laterality: N/A;  . Atrial fibrillation and atrial flutter ablation  08/13/11    PVI and CTI ablation by Dr Rayann Heman  . Cardioversion  11/21/2011    Procedure: CARDIOVERSION;  Surgeon: Larey Dresser, MD;  Location: [place];  Service: Cardiovascular;  Laterality: N/A;  . Ablation of dysrhythmic focus  12/27/2011    CTI repeat ablation by Dr Rayann Heman    . Atrial fibrillation ablation N/A 08/13/2011    Procedure: ATRIAL FIBRILLATION ABLATION;  Surgeon: Thompson Grayer, MD;  Location: [facility] CATH LAB;  Service: Cardiovascular;  Laterality: N/A;  . Atrial flutter ablation N/A 12/27/2011    Procedure: ATRIAL FLUTTER ABLATION;  Surgeon: Thompson Grayer, MD;  Location: [facility] CATH LAB;  Service: Cardiovascular;  Laterality: N/A;    reports that he quit smoking about 3 years ago. His smoking use included Cigarettes. He has a 4.5 pack-year smoking history. He has never used smokeless tobacco. He reports that he drinks about 0.6 oz of alcohol per week. He reports that he does not use illicit drugs. family history includes Heart disease in his sister; Sleep apnea in his sister; Stroke in his mother; Thyroid disease in his sister. There is no history of Anesthesia problems. Allergies  Allergen Reactions  . Ibuprofen Other (See Comments)    REACTION: rapid heart beat   Current Outpatient Prescriptions on File Prior to Visit  Medication Sig Dispense Refill  . amLODipine (NORVASC) 2.5 MG tablet TAKE 1 TABLET BY MOUTH DAILY 90 tablet 0  . atorvastatin (LIPITOR) 20 MG tablet Take 1 tablet (20 mg total) by mouth daily. 90 tablet 3  . diltiazem (CARDIZEM) 30 MG tablet Take one tablet every 6 hours as needed for elevated heart rate 30 tablet 12  . doxazosin (CARDURA) 4 MG tablet Take 1 tablet (4 mg total) by mouth at bedtime. 90 tablet 3  . flecainide (TAMBOCOR) 100 MG tablet 1 tablet twice daily    . irbesartan (AVAPRO) 300 MG tablet TAKE  1 TABLET BY MOUTH EVERY MORNING 90 tablet 0  . pantoprazole (PROTONIX) 40 MG tablet Take 1 tablet (40 mg total) by mouth daily. 90 tablet 3  . sildenafil (REVATIO) 20 MG tablet Take 20 mg by mouth 3 (three) times daily.    . tadalafil (CIALIS) 20 MG tablet TAKE 1 TABLET ONCE DAILY AS NEEDED 4 tablet 11  . warfarin (COUMADIN) 5 MG tablet Take as directed by Coumadin clinic 135 tablet 1   No current facility-administered medications on  file prior to visit.   Review of Systems Constitutional: Negative for increased diaphoresis, other activity, appetite or other siginficant weight change  HENT: Negative for worsening hearing loss, ear pain, facial swelling, mouth sores and neck stiffness.   Eyes: Negative for other worsening pain, redness or visual disturbance.  Respiratory: Negative for shortness of breath and wheezing.   Cardiovascular: Negative for chest pain and palpitations.  Gastrointestinal: Negative for diarrhea, blood in stool, abdominal distention or other pain Genitourinary: Negative for hematuria, flank pain or change in urine volume.  Musculoskeletal: Negative for myalgias or other joint complaints.  Skin: Negative for color change and wound.  Neurological: Negative for syncope and numbness. other than noted Hematological: Negative for adenopathy. or other swelling Psychiatric/Behavioral: Negative for hallucinations, self-injury, decreased concentration or other worsening agitation.      Objective:   Physical Exam BP 138/80 mmHg  Pulse 53  Temp(Src) 98.5 F (36.9 C) (Oral)  Ht 5\' 11"  (1.803 m)  Wt 190 lb (86.183 kg)  BMI 26.51 kg/m2 VS noted,  Constitutional: Pt is oriented to person, place, and time. Appears well-developed and well-nourished.  Head: Normocephalic and atraumatic.  Right Ear: External ear normal.  Left Ear: External ear normal.  Nose: Nose normal.  Mouth/Throat: Oropharynx is clear and moist.  Eyes: Conjunctivae and EOM are normal. Pupils are equal, round, and reactive to light.  Neck: Normal range of motion. Neck supple. No JVD present. No tracheal deviation present.  Cardiovascular: Normal rate, regular rhythm, normal heart sounds and intact distal pulses.   Pulmonary/Chest: Effort normal and breath sounds without rales or wheezing  Abdominal: Soft. Bowel sounds are normal. NT. No HSM  Musculoskeletal: Normal range of motion. Exhibits no edema.  Lymphadenopathy:  Has no cervical  adenopathy.  Neurological: Pt is alert and oriented to person, place, and time. Pt has normal reflexes. No cranial nerve deficit. Motor grossly intact Skin: Skin is warm and dry. No rash noted.  Psychiatric:  Has normal mood and affect. Behavior is normal.      Assessment & Plan:

## 2014-07-14 NOTE — Patient Instructions (Signed)

## 2014-07-14 NOTE — Assessment & Plan Note (Signed)

## 2014-07-14 NOTE — Progress Notes (Signed)
Pre visit review using our clinic review tool, if applicable. No additional management support is needed unless otherwise documented below in the visit note. 

## 2014-08-01 ENCOUNTER — Encounter: Payer: Self-pay | Admitting: Internal Medicine

## 2014-08-01 ENCOUNTER — Ambulatory Visit (INDEPENDENT_AMBULATORY_CARE_PROVIDER_SITE_OTHER): Payer: BLUE CROSS/BLUE SHIELD

## 2014-08-01 ENCOUNTER — Other Ambulatory Visit: Payer: Self-pay | Admitting: Internal Medicine

## 2014-08-01 ENCOUNTER — Ambulatory Visit (INDEPENDENT_AMBULATORY_CARE_PROVIDER_SITE_OTHER): Payer: BLUE CROSS/BLUE SHIELD | Admitting: Internal Medicine

## 2014-08-01 VITALS — BP 124/80 | HR 80 | Ht 73.0 in | Wt 197.8 lb

## 2014-08-01 DIAGNOSIS — I4891 Unspecified atrial fibrillation: Secondary | ICD-10-CM | POA: Diagnosis not present

## 2014-08-01 DIAGNOSIS — I4819 Other persistent atrial fibrillation: Secondary | ICD-10-CM

## 2014-08-01 DIAGNOSIS — I481 Persistent atrial fibrillation: Secondary | ICD-10-CM

## 2014-08-01 DIAGNOSIS — Z5181 Encounter for therapeutic drug level monitoring: Secondary | ICD-10-CM

## 2014-08-01 DIAGNOSIS — I4892 Unspecified atrial flutter: Secondary | ICD-10-CM

## 2014-08-01 DIAGNOSIS — I10 Essential (primary) hypertension: Secondary | ICD-10-CM

## 2014-08-01 LAB — POCT INR: INR: 2.9

## 2014-08-01 NOTE — Progress Notes (Signed)
Electrophysiology Office Note   Date:  08/01/2014   ID:  [name] [name], DOB May 23, 1961, MRN [id number]  PCP:  [name] Cower, MD  Cardiologist:  Dr [name] [name] Primary Electrophysiologist: [name] Grayer, MD    Chief Complaint  Patient presents with  . Follow-up    Persistent AFIB     History of Present Illness: [name] [name] is a 54 y.o. male who presents today for electrophysiology evaluation.   He continues to do very well.  He has intermittent afib but tolerates this well.  He thinks that he was in sinus up until an hour or so again and has since returned to afib.  He does have a recent ekg documenting sinus rhythm.   He continues to work at [employer] and is able to walk around campus without limitation.  Today, he denies symptoms of palpitations, chest pain, shortness of breath, orthopnea, PND, lower extremity edema, claudication, dizziness, presyncope, syncope, bleeding, or neurologic sequela. The patient is tolerating medications without difficulties and is otherwise without complaint today.    Past Medical History  Diagnosis Date  . Paroxysmal atrial fibrillation 12/15/2007  . CHEST PAIN-PRECORDIAL 05/25/2009  . ERECTILE DYSFUNCTION, ORGANIC 01/16/2010  . HYPERLIPIDEMIA 12/15/2007  . HYPERTENSION 12/15/2007  . HYPOKALEMIA 12/15/2007  . POSTURAL LIGHTHEADEDNESS 07/06/2008  . Atrial flutter   . Internal hemorrhoids    Past Surgical History  Procedure Laterality Date  . Inguinal herniorrhapy  6/08  . Hernia repair    . Elbow surgery  bone spur  . Tee without cardioversion  08/12/2011    Procedure: TRANSESOPHAGEAL ECHOCARDIOGRAM (TEE);  Surgeon: [name] Records, MD;  Location: [facility] ENDOSCOPY;  Service: Cardiovascular;  Laterality: N/A;  . Atrial fibrillation and atrial flutter ablation  08/13/11    PVI and CTI ablation by Dr [name] [name]  . Cardioversion  11/21/2011    Procedure: CARDIOVERSION;  Surgeon: [name] Dresser, MD;  Location: [place];  Service: Cardiovascular;  Laterality:  N/A;  . Ablation of dysrhythmic focus  12/27/2011    CTI repeat ablation by Dr [name] [name]  . Atrial fibrillation ablation N/A 08/13/2011    Procedure: ATRIAL FIBRILLATION ABLATION;  Surgeon: [name] Grayer, MD;  Location: [facility] CATH LAB;  Service: Cardiovascular;  Laterality: N/A;  . Atrial flutter ablation N/A 12/27/2011    Procedure: ATRIAL FLUTTER ABLATION;  Surgeon: [name] Grayer, MD;  Location: [facility] CATH LAB;  Service: Cardiovascular;  Laterality: N/A;     Current Outpatient Prescriptions  Medication Sig Dispense Refill  . amLODipine (NORVASC) 2.5 MG tablet TAKE 1 TABLET BY MOUTH DAILY 90 tablet 0  . atorvastatin (LIPITOR) 20 MG tablet TAKE 1 TABLET EVERY DAY (Patient taking differently: TAKE 1 TABLET BY MOUTH EVERY DAY) 90 tablet 3  . diltiazem (CARDIZEM) 30 MG tablet Take one tablet every 6 hours as needed for elevated heart rate (Patient taking differently: Take one tablet by mouth every 6 hours as needed for elevated heart rate) 30 tablet 12  . doxazosin (CARDURA) 4 MG tablet Take 1 tablet (4 mg total) by mouth at bedtime. 90 tablet 3  . flecainide (TAMBOCOR) 100 MG tablet Take 1 tablet by mouth twice daily    . irbesartan (AVAPRO) 300 MG tablet TAKE 1 TABLET BY MOUTH EVERY MORNING 90 tablet 0  . Multiple Vitamins-Minerals (MULTIVITAMIN ADULTS 50+ PO) Take 1 capsule by mouth daily.    . pantoprazole (PROTONIX) 40 MG tablet Take 1 tablet (40 mg total) by mouth daily. 90 tablet 3  . sildenafil (REVATIO) 20 MG  tablet Take 20 mg by mouth 3 (three) times daily.    Marland Kitchen warfarin (COUMADIN) 5 MG tablet Take as directed by Coumadin clinic 135 tablet 1   No current facility-administered medications for this visit.    Allergies:   Ibuprofen   Social History:  The patient  reports that he quit smoking about 3 years ago. His smoking use included Cigarettes. He has a 4.5 pack-year smoking history. He has never used smokeless tobacco. He reports that he drinks about 0.6 oz of alcohol per week. He reports that  he does not use illicit drugs.   Family History:  The patient's  family history includes Heart disease in his sister; Sleep apnea in his sister; Stroke in his mother; Thyroid disease in his sister. There is no history of Anesthesia problems.    ROS:  Please see the history of present illness.   All other systems are reviewed and negative.    PHYSICAL EXAM: VS:  BP 124/80 mmHg  Pulse 80  Ht 6\' 1"  (1.854 m)  Wt 197 lb 12.8 oz (89.721 kg)  BMI 26.10 kg/m2 , BMI Body mass index is 26.1 kg/(m^2). GEN: Well nourished, well developed, in no acute distress HEENT: normal Neck: no JVD, carotid bruits, or masses Cardiac: iRRR; no murmurs, rubs, or gallops,no edema  Respiratory:  clear to auscultation bilaterally, normal work of breathing GI: soft, nontender, nondistended, + BS MS: no deformity or atrophy Skin: warm and dry  Neuro:  Strength and sensation are intact Psych: euthymic mood, full affect  EKG:  EKG is ordered today. The ekg ordered today shows afib, V rate 80 bpm   Recent Labs: 07/14/2014: ALT 26; BUN 23; Creatinine 1.03; Hemoglobin 14.7; Platelets 211.0; Potassium 3.9; Sodium 139; TSH 0.86    Lipid Panel     Component Value Date/Time   CHOL 158 07/14/2014 1415   TRIG 96.0 07/14/2014 1415   HDL 65.90 07/14/2014 1415   CHOLHDL 2 07/14/2014 1415   VLDL 19.2 07/14/2014 1415   LDLCALC 73 07/14/2014 1415   LDLDIRECT 115.7 07/13/2013 1105     Wt Readings from Last 3 Encounters:  08/01/14 197 lb 12.8 oz (89.721 kg)  07/14/14 190 lb (86.183 kg)  02/02/14 191 lb (86.637 kg)      ASSESSMENT AND PLAN:   1.  Persistent afib Doing well with flecainide 100mg  BID I also discussed ablation as an option today.  He would like to avoid this if possible. Chads vasc score of 1, for now continue coumadin  2. HTN Stable No change required today  F/U 6 months with [name] Palau NP in the AF clinic  Current medicines are reviewed at length with the patient today.   The  patient does not have concerns regarding his medicines.  The following changes were made today:  none   Signed, [name] Grayer, MD  08/01/2014 2:16 PM     [address] [place] [address] [phone] (office) [phone] (fax)

## 2014-08-01 NOTE — Patient Instructions (Signed)
Your physician wants you to follow-up in: 6 months with Donna Carroll, NP. You will receive a reminder letter in the mail two months in advance. If you don't receive a letter, please call our office to schedule the follow-up appointment.  

## 2014-08-03 ENCOUNTER — Other Ambulatory Visit: Payer: Self-pay | Admitting: Internal Medicine

## 2014-08-03 MED ORDER — PANTOPRAZOLE SODIUM 40 MG PO TBEC: DELAYED_RELEASE_TABLET | ORAL | Status: DC

## 2014-08-03 NOTE — Addendum Note (Signed)
Addended by: Earnstine Regal on: 08/03/2014 10:15 AM   Modules accepted: Orders

## 2014-08-08 ENCOUNTER — Other Ambulatory Visit: Payer: Self-pay | Admitting: Internal Medicine

## 2014-09-12 ENCOUNTER — Ambulatory Visit (INDEPENDENT_AMBULATORY_CARE_PROVIDER_SITE_OTHER): Payer: BLUE CROSS/BLUE SHIELD | Admitting: *Deleted

## 2014-09-12 DIAGNOSIS — I4891 Unspecified atrial fibrillation: Secondary | ICD-10-CM | POA: Diagnosis not present

## 2014-09-12 DIAGNOSIS — Z5181 Encounter for therapeutic drug level monitoring: Secondary | ICD-10-CM | POA: Diagnosis not present

## 2014-09-12 DIAGNOSIS — I481 Persistent atrial fibrillation: Secondary | ICD-10-CM

## 2014-09-12 DIAGNOSIS — I4819 Other persistent atrial fibrillation: Secondary | ICD-10-CM

## 2014-09-12 LAB — POCT INR: INR: 2.7

## 2014-10-06 ENCOUNTER — Other Ambulatory Visit: Payer: Self-pay

## 2014-10-06 MED ORDER — DOXAZOSIN MESYLATE 4 MG PO TABS: 4.0000 mg | ORAL_TABLET | Freq: Every day | ORAL | Status: DC

## 2014-10-06 MED ORDER — IRBESARTAN 300 MG PO TABS: 300.0000 mg | ORAL_TABLET | Freq: Every morning | ORAL | Status: DC

## 2014-10-07 ENCOUNTER — Other Ambulatory Visit: Payer: Self-pay | Admitting: Internal Medicine

## 2014-10-18 ENCOUNTER — Other Ambulatory Visit: Payer: Self-pay

## 2014-10-18 ENCOUNTER — Telehealth: Payer: Self-pay | Admitting: Internal Medicine

## 2014-10-18 MED ORDER — AMLODIPINE BESYLATE 2.5 MG PO TABS: 2.5000 mg | ORAL_TABLET | Freq: Every day | ORAL | Status: DC

## 2014-10-18 NOTE — Telephone Encounter (Signed)
Rx refilled.

## 2014-10-18 NOTE — Telephone Encounter (Signed)
Patient is requesting refill on amlodipine to be sent to [facility] on [address]

## 2014-12-28 ENCOUNTER — Ambulatory Visit (INDEPENDENT_AMBULATORY_CARE_PROVIDER_SITE_OTHER): Payer: BLUE CROSS/BLUE SHIELD | Admitting: *Deleted

## 2014-12-28 DIAGNOSIS — I481 Persistent atrial fibrillation: Secondary | ICD-10-CM | POA: Diagnosis not present

## 2014-12-28 DIAGNOSIS — Z5181 Encounter for therapeutic drug level monitoring: Secondary | ICD-10-CM | POA: Diagnosis not present

## 2014-12-28 DIAGNOSIS — I4891 Unspecified atrial fibrillation: Secondary | ICD-10-CM | POA: Diagnosis not present

## 2014-12-28 DIAGNOSIS — I4819 Other persistent atrial fibrillation: Secondary | ICD-10-CM

## 2014-12-28 LAB — POCT INR: INR: 2.4

## 2015-02-04 ENCOUNTER — Other Ambulatory Visit: Payer: Self-pay | Admitting: Internal Medicine

## 2015-02-08 ENCOUNTER — Ambulatory Visit (INDEPENDENT_AMBULATORY_CARE_PROVIDER_SITE_OTHER): Payer: BLUE CROSS/BLUE SHIELD | Admitting: *Deleted

## 2015-02-08 DIAGNOSIS — I4891 Unspecified atrial fibrillation: Secondary | ICD-10-CM | POA: Diagnosis not present

## 2015-02-08 DIAGNOSIS — I481 Persistent atrial fibrillation: Secondary | ICD-10-CM | POA: Diagnosis not present

## 2015-02-08 DIAGNOSIS — Z5181 Encounter for therapeutic drug level monitoring: Secondary | ICD-10-CM

## 2015-02-08 DIAGNOSIS — I4819 Other persistent atrial fibrillation: Secondary | ICD-10-CM

## 2015-02-08 LAB — POCT INR: INR: 2

## 2015-03-16 ENCOUNTER — Other Ambulatory Visit: Payer: Self-pay | Admitting: Internal Medicine

## 2015-03-26 ENCOUNTER — Other Ambulatory Visit: Payer: Self-pay | Admitting: Internal Medicine

## 2015-03-27 ENCOUNTER — Ambulatory Visit (INDEPENDENT_AMBULATORY_CARE_PROVIDER_SITE_OTHER): Payer: BLUE CROSS/BLUE SHIELD | Admitting: *Deleted

## 2015-03-27 ENCOUNTER — Other Ambulatory Visit: Payer: Self-pay

## 2015-03-27 DIAGNOSIS — Z5181 Encounter for therapeutic drug level monitoring: Secondary | ICD-10-CM | POA: Diagnosis not present

## 2015-03-27 DIAGNOSIS — I481 Persistent atrial fibrillation: Secondary | ICD-10-CM | POA: Diagnosis not present

## 2015-03-27 DIAGNOSIS — I4891 Unspecified atrial fibrillation: Secondary | ICD-10-CM | POA: Diagnosis not present

## 2015-03-27 DIAGNOSIS — I4819 Other persistent atrial fibrillation: Secondary | ICD-10-CM

## 2015-03-27 LAB — POCT INR: INR: 2.5

## 2015-03-27 MED ORDER — FLECAINIDE ACETATE 100 MG PO TABS: 100.0000 mg | ORAL_TABLET | Freq: Two times a day (BID) | ORAL | Status: DC

## 2015-05-01 ENCOUNTER — Encounter: Payer: Self-pay | Admitting: Internal Medicine

## 2015-05-01 ENCOUNTER — Ambulatory Visit (INDEPENDENT_AMBULATORY_CARE_PROVIDER_SITE_OTHER): Payer: BLUE CROSS/BLUE SHIELD | Admitting: Internal Medicine

## 2015-05-01 VITALS — BP 138/84 | HR 97 | Ht 73.0 in | Wt 196.4 lb

## 2015-05-01 DIAGNOSIS — I481 Persistent atrial fibrillation: Secondary | ICD-10-CM

## 2015-05-01 DIAGNOSIS — I10 Essential (primary) hypertension: Secondary | ICD-10-CM

## 2015-05-01 DIAGNOSIS — I4819 Other persistent atrial fibrillation: Secondary | ICD-10-CM

## 2015-05-01 DIAGNOSIS — I4892 Unspecified atrial flutter: Secondary | ICD-10-CM | POA: Diagnosis not present

## 2015-05-01 MED ORDER — DILTIAZEM HCL ER COATED BEADS 120 MG PO CP24: 120.0000 mg | ORAL_CAPSULE | Freq: Every day | ORAL | Status: DC

## 2015-05-01 NOTE — Patient Instructions (Signed)
Medication Instructions:  Your physician has recommended you make the following change in your medication:  1) Start Cardizem 120 mg daily   Labwork: None ordered   Testing/Procedures: Your physician has requested that you have an echocardiogram. Echocardiography is a painless test that uses sound waves to create images of your heart. It provides your doctor with information about the size and shape of your heart and how well your heart's chambers and valves are working. This procedure takes approximately one hour. There are no restrictions for this procedure.    Follow-Up: Your physician wants you to follow-up in: 6 months with Roderic Palau, NP and 12 months with Dr Vallery Ridge will receive a reminder letter in the mail two months in advance. If you don't receive a letter, please call our office to schedule the follow-up appointment.   Any Other Special Instructions Will Be Listed Below (If Applicable).     If you need a refill on your cardiac medications before your next appointment, please call your pharmacy.

## 2015-05-01 NOTE — Progress Notes (Signed)
Electrophysiology Office Note   Date:  05/01/2015   ID:  [name], DOB 07-Jul-1960, MRN [id number]  PCP:  [name] Cower, MD  Cardiologist:  Dr [name] [name] Primary Electrophysiologist: [name] Grayer, MD    Chief Complaint  Patient presents with  . Persistent AFIB     History of Present Illness: [name] [name] is a 54 y.o. male who presents today for electrophysiology evaluation.   He continues to do very well.  He has intermittent afib but tolerates this well.   He continues to work at [employer] and is able to walk around campus without limitation.  He thinks that he is in sinus the majority of the time and checks his pulse (HRs 60s).  Today, he denies symptoms of palpitations, chest pain, shortness of breath, orthopnea, PND, lower extremity edema, claudication, dizziness, presyncope, syncope, bleeding, or neurologic sequela. The patient is tolerating medications without difficulties and is otherwise without complaint today.    Past Medical History  Diagnosis Date  . Paroxysmal atrial fibrillation ([place]) 12/15/2007  . CHEST PAIN-PRECORDIAL 05/25/2009  . ERECTILE DYSFUNCTION, ORGANIC 01/16/2010  . HYPERLIPIDEMIA 12/15/2007  . HYPERTENSION 12/15/2007  . HYPOKALEMIA 12/15/2007  . POSTURAL LIGHTHEADEDNESS 07/06/2008  . Atrial flutter ([place])   . Internal hemorrhoids    Past Surgical History  Procedure Laterality Date  . Inguinal herniorrhapy  6/08  . Hernia repair    . Elbow surgery  bone spur  . Tee without cardioversion  08/12/2011    Procedure: TRANSESOPHAGEAL ECHOCARDIOGRAM (TEE);  Surgeon: [name] Records, MD;  Location: [facility] ENDOSCOPY;  Service: Cardiovascular;  Laterality: N/A;  . Atrial fibrillation and atrial flutter ablation  08/13/11    PVI and CTI ablation by Dr [name] [name]  . Cardioversion  11/21/2011    Procedure: CARDIOVERSION;  Surgeon: [name] Dresser, MD;  Location: [place];  Service: Cardiovascular;  Laterality: N/A;  . Ablation of dysrhythmic focus  12/27/2011    CTI  repeat ablation by Dr [name] [name]  . Atrial fibrillation ablation N/A 08/13/2011    Procedure: ATRIAL FIBRILLATION ABLATION;  Surgeon: [name] Grayer, MD;  Location: [facility] CATH LAB;  Service: Cardiovascular;  Laterality: N/A;  . Atrial flutter ablation N/A 12/27/2011    Procedure: ATRIAL FLUTTER ABLATION;  Surgeon: [name] Grayer, MD;  Location: [facility] CATH LAB;  Service: Cardiovascular;  Laterality: N/A;     Current Outpatient Prescriptions  Medication Sig Dispense Refill  . amLODipine (NORVASC) 2.5 MG tablet Take 1 tablet (2.5 mg total) by mouth daily. 90 tablet 3  . atorvastatin (LIPITOR) 20 MG tablet Take 20 mg by mouth daily.    Marland Kitchen diltiazem (CARDIZEM) 30 MG tablet Take one tablet by mouth every 6 hours as needed for elevated heart rate    . doxazosin (CARDURA) 4 MG tablet Take 1 tablet (4 mg total) by mouth at bedtime. 90 tablet 3  . flecainide (TAMBOCOR) 100 MG tablet Take 1 tablet (100 mg total) by mouth 2 (two) times daily. 60 tablet 6  . irbesartan (AVAPRO) 300 MG tablet TAKE 1 TABLET BY MOUTH EVERY MORNING 90 tablet 3  . Multiple Vitamins-Minerals (MULTIVITAMIN ADULTS 50+ PO) Take 1 capsule by mouth daily.    . pantoprazole (PROTONIX) 40 MG tablet TAKE 1 TABLET (40 MG TOTAL) BY MOUTH DAILY. 90 tablet 3  . sildenafil (REVATIO) 20 MG tablet Take 20 mg by mouth 3 (three) times daily as needed.     . warfarin (COUMADIN) 5 MG tablet TAKE AS DIRECTED BY ANTICOAGULATION CLINIC 135 tablet 0  .  diltiazem (CARDIZEM CD) 120 MG 24 hr capsule Take 1 capsule (120 mg total) by mouth daily. 90 capsule 3   No current facility-administered medications for this visit.    Allergies:   Ibuprofen   Social History:  The patient  reports that he quit smoking about 4 years ago. His smoking use included Cigarettes. He has a 4.5 pack-year smoking history. He has never used smokeless tobacco. He reports that he drinks about 0.6 oz of alcohol per week. He reports that he does not use illicit drugs.   Family History:  The  patient's  family history includes Heart disease in his sister; Sleep apnea in his sister; Stroke in his mother; Thyroid disease in his sister. There is no history of Anesthesia problems.    ROS:  Please see the history of present illness.   All other systems are reviewed and negative.    PHYSICAL EXAM: VS:  BP 138/84 mmHg  Pulse 97  Ht 6\' 1"  (1.854 m)  Wt 196 lb 6.4 oz (89.086 kg)  BMI 25.92 kg/m2 , BMI Body mass index is 25.92 kg/(m^2). GEN: Well nourished, well developed, in no acute distress HEENT: normal Neck: no JVD, carotid bruits, or masses Cardiac: iRRR; no murmurs, rubs, or gallops,no edema  Respiratory:  clear to auscultation bilaterally, normal work of breathing GI: soft, nontender, nondistended, + BS MS: no deformity or atrophy Skin: warm and dry  Neuro:  Strength and sensation are intact Psych: euthymic mood, full affect  EKG:  EKG is ordered today. The ekg ordered today shows afib, V rate 97 bpm   Recent Labs: 07/14/2014: ALT 26; BUN 23; Creatinine, Ser 1.03; Hemoglobin 14.7; Platelets 211.0; Potassium 3.9; Sodium 139; TSH 0.86    Lipid Panel     Component Value Date/Time   CHOL 158 07/14/2014 1415   TRIG 96.0 07/14/2014 1415   HDL 65.90 07/14/2014 1415   CHOLHDL 2 07/14/2014 1415   VLDL 19.2 07/14/2014 1415   LDLCALC 73 07/14/2014 1415   LDLDIRECT 115.7 07/13/2013 1105     Wt Readings from Last 3 Encounters:  05/01/15 196 lb 6.4 oz (89.086 kg)  08/01/14 197 lb 12.8 oz (89.721 kg)  07/14/14 190 lb (86.183 kg)      ASSESSMENT AND PLAN:   1.  Persistent afib Doing well with flecainide 100mg  BID Add diltiazem CD 120mg  daily Chads vasc score of 1, for now continue coumadin Echo to evaluate for structural changes related to afib  2. HTN Stable No change required today  F/U 6 months with [name] Palau NP in the AF clinic I will see in a year  Current medicines are reviewed at length with the patient today.   The patient does not have  concerns regarding his medicines.  The following changes were made today:  none   Signed, [name] Grayer, MD  05/01/2015 11:56 AM     [facility] [address] [place] [address] [phone] (office) [phone] (fax)

## 2015-05-08 ENCOUNTER — Ambulatory Visit (INDEPENDENT_AMBULATORY_CARE_PROVIDER_SITE_OTHER): Payer: BLUE CROSS/BLUE SHIELD | Admitting: *Deleted

## 2015-05-08 DIAGNOSIS — I4891 Unspecified atrial fibrillation: Secondary | ICD-10-CM

## 2015-05-08 DIAGNOSIS — I481 Persistent atrial fibrillation: Secondary | ICD-10-CM | POA: Diagnosis not present

## 2015-05-08 DIAGNOSIS — Z5181 Encounter for therapeutic drug level monitoring: Secondary | ICD-10-CM

## 2015-05-08 DIAGNOSIS — I4819 Other persistent atrial fibrillation: Secondary | ICD-10-CM

## 2015-05-08 LAB — POCT INR: INR: 2.4

## 2015-05-18 ENCOUNTER — Telehealth: Payer: Self-pay | Admitting: *Deleted

## 2015-05-18 DIAGNOSIS — Z125 Encounter for screening for malignant neoplasm of prostate: Secondary | ICD-10-CM

## 2015-05-18 DIAGNOSIS — Z0000 Encounter for general adult medical examination without abnormal findings: Secondary | ICD-10-CM

## 2015-05-18 NOTE — Telephone Encounter (Signed)
Left msg on triage stating he is has an dental appt schedule for this Tues 12/20, and he is wanting to know if md can rx 2 valium to take prior...[name] [name]

## 2015-05-19 ENCOUNTER — Telehealth: Payer: Self-pay | Admitting: Internal Medicine

## 2015-05-19 ENCOUNTER — Telehealth: Payer: Self-pay | Admitting: *Deleted

## 2015-05-19 ENCOUNTER — Telehealth: Payer: Self-pay | Admitting: Physician Assistant

## 2015-05-19 NOTE — Telephone Encounter (Signed)
Patient called to inform us that he was having a tooth extraction & dental cleaning next week 05/23/15.  He stated that his dental office spoke with someone in reference to the him having the procedure and that they told him not to hold his Anticoagulant.  Advised that we were unaware of the upcoming extraction and to date there was not a note from [facility] or Cardiology in his chart about the conversation, also informed him that for a single extraction the Anticoagulant is not generally held.  Instructed him to have the Dental office contact us using the same number he dialed & he verbalized understanding.

## 2015-05-19 NOTE — Telephone Encounter (Signed)
Taking valium for this purpose would be unusual, and pt has no hx of need for this in the past, and I was hoping he would be ok without this med, He should be able to tolerate ok

## 2015-05-19 NOTE — Telephone Encounter (Signed)
Error. Pt denied giving any information. Transferred call to coumadin per pt req.

## 2015-05-19 NOTE — Telephone Encounter (Signed)
ERROR

## 2015-05-22 NOTE — Telephone Encounter (Signed)
Spoke with pt and he stated he spoke with his dental office & they do not need him to hold his coumadin but will nedd his INR checked the day before his extraction & deep cleaning.  He has appt on Wednesday prior to his procedure on Thursday.

## 2015-05-22 NOTE — Telephone Encounter (Signed)
Notified pt with md response. Pt is also wanting to go ahead and make [facility]X appt for Feb. Made for 07/18/15 @ 8:30 am. Entered [facility] labs...[name] [name]

## 2015-05-24 ENCOUNTER — Ambulatory Visit (INDEPENDENT_AMBULATORY_CARE_PROVIDER_SITE_OTHER): Payer: BLUE CROSS/BLUE SHIELD | Admitting: *Deleted

## 2015-05-24 ENCOUNTER — Ambulatory Visit (HOSPITAL_COMMUNITY): Payer: BLUE CROSS/BLUE SHIELD | Attending: Cardiology

## 2015-05-24 ENCOUNTER — Other Ambulatory Visit: Payer: Self-pay

## 2015-05-24 DIAGNOSIS — I4891 Unspecified atrial fibrillation: Secondary | ICD-10-CM | POA: Diagnosis not present

## 2015-05-24 DIAGNOSIS — I481 Persistent atrial fibrillation: Secondary | ICD-10-CM

## 2015-05-24 DIAGNOSIS — Z5181 Encounter for therapeutic drug level monitoring: Secondary | ICD-10-CM | POA: Diagnosis not present

## 2015-05-24 DIAGNOSIS — I4892 Unspecified atrial flutter: Secondary | ICD-10-CM | POA: Insufficient documentation

## 2015-05-24 DIAGNOSIS — I4819 Other persistent atrial fibrillation: Secondary | ICD-10-CM

## 2015-05-24 LAB — POCT INR: INR: 2.6

## 2015-06-04 HISTORY — PX: POLYPECTOMY: SHX149

## 2015-06-04 HISTORY — PX: COLONOSCOPY: SHX174

## 2015-06-26 ENCOUNTER — Other Ambulatory Visit: Payer: Self-pay | Admitting: Internal Medicine

## 2015-07-18 ENCOUNTER — Other Ambulatory Visit (INDEPENDENT_AMBULATORY_CARE_PROVIDER_SITE_OTHER): Payer: BLUE CROSS/BLUE SHIELD

## 2015-07-18 ENCOUNTER — Encounter: Payer: Self-pay | Admitting: Internal Medicine

## 2015-07-18 ENCOUNTER — Ambulatory Visit (INDEPENDENT_AMBULATORY_CARE_PROVIDER_SITE_OTHER): Payer: BLUE CROSS/BLUE SHIELD | Admitting: Internal Medicine

## 2015-07-18 VITALS — BP 118/68 | HR 66 | Temp 97.9°F | Ht 73.0 in | Wt 193.2 lb

## 2015-07-18 DIAGNOSIS — Z0000 Encounter for general adult medical examination without abnormal findings: Secondary | ICD-10-CM | POA: Diagnosis not present

## 2015-07-18 DIAGNOSIS — N529 Male erectile dysfunction, unspecified: Secondary | ICD-10-CM

## 2015-07-18 DIAGNOSIS — I10 Essential (primary) hypertension: Secondary | ICD-10-CM

## 2015-07-18 LAB — LIPID PANEL
CHOLESTEROL: 174 mg/dL (ref 0–200)
HDL: 59.8 mg/dL (ref >39.00)
LDL CALC: 90 mg/dL (ref 0–99)
NONHDL: 113.73
Total CHOL/HDL Ratio: 3
Triglycerides: 119 mg/dL (ref 0.0–149.0)
VLDL: 23.8 mg/dL (ref 0.0–40.0)

## 2015-07-18 LAB — URINALYSIS, ROUTINE W REFLEX MICROSCOPIC
BILIRUBIN URINE: NEGATIVE
Ketones, ur: NEGATIVE
Leukocytes, UA: NEGATIVE
NITRITE: NEGATIVE
PH: 5.5 (ref 5.0–8.0)
Specific Gravity, Urine: 1.025 (ref 1.000–1.030)
TOTAL PROTEIN, URINE-UPE24: NEGATIVE
Urine Glucose: NEGATIVE
Urobilinogen, UA: 0.2 (ref 0.0–1.0)

## 2015-07-18 LAB — TSH: TSH: 0.97 u[IU]/mL (ref 0.35–4.50)

## 2015-07-18 LAB — HEPATIC FUNCTION PANEL
ALK PHOS: 60 U/L (ref 39–117)
ALT: 30 U/L (ref 0–53)
AST: 19 U/L (ref 0–37)
Albumin: 4.4 g/dL (ref 3.5–5.2)
BILIRUBIN DIRECT: 0.1 mg/dL (ref 0.0–0.3)
BILIRUBIN TOTAL: 0.7 mg/dL (ref 0.2–1.2)
Total Protein: 7.8 g/dL (ref 6.0–8.3)

## 2015-07-18 LAB — CBC WITH DIFFERENTIAL/PLATELET
BASOS PCT: 0.4 % (ref 0.0–3.0)
Basophils Absolute: 0 10*3/uL (ref 0.0–0.1)
EOS ABS: 0.2 10*3/uL (ref 0.0–0.7)
EOS PCT: 2.3 % (ref 0.0–5.0)
HCT: 44.2 % (ref 39.0–52.0)
Hemoglobin: 14.9 g/dL (ref 13.0–17.0)
LYMPHS ABS: 2.4 10*3/uL (ref 0.7–4.0)
Lymphocytes Relative: 26.2 % (ref 12.0–46.0)
MCHC: 33.7 g/dL (ref 30.0–36.0)
MCV: 88.5 fl (ref 78.0–100.0)
MONO ABS: 0.9 10*3/uL (ref 0.1–1.0)
Monocytes Relative: 9.8 % (ref 3.0–12.0)
NEUTROS ABS: 5.5 10*3/uL (ref 1.4–7.7)
Neutrophils Relative %: 61.3 % (ref 43.0–77.0)
PLATELETS: 228 10*3/uL (ref 150.0–400.0)
RBC: 4.99 Mil/uL (ref 4.22–5.81)
RDW: 13.8 % (ref 11.5–15.5)
WBC: 9 10*3/uL (ref 4.0–10.5)

## 2015-07-18 LAB — PSA: PSA: 0.58 ng/mL (ref 0.10–4.00)

## 2015-07-18 LAB — BASIC METABOLIC PANEL
BUN: 17 mg/dL (ref 6–23)
CO2: 29 meq/L (ref 19–32)
Calcium: 9.6 mg/dL (ref 8.4–10.5)
Chloride: 105 mEq/L (ref 96–112)
Creatinine, Ser: 1.17 mg/dL (ref 0.40–1.50)
GFR: 82.96 mL/min (ref >60.00)
GLUCOSE: 111 mg/dL — AB (ref 70–99)
Potassium: 4.1 mEq/L (ref 3.5–5.1)
Sodium: 140 mEq/L (ref 135–145)

## 2015-07-18 NOTE — Assessment & Plan Note (Signed)
stable overall by history and exam, recent data reviewed with pt, and pt to continue medical treatment as before,  to f/u any worsening symptoms or concerns BP Readings from Last 3 Encounters:  07/18/15 118/68  05/01/15 138/84  08/01/14 124/80

## 2015-07-18 NOTE — Assessment & Plan Note (Signed)
Encouraged to see urology soon, ? Eligible for the shots

## 2015-07-18 NOTE — Progress Notes (Signed)
Subjective:    Patient ID: [name] [name], male    DOB: February 24, 1961, 55 y.o.   MRN: [id number]  HPI  Here for wellness and f/u;  Overall doing ok;  Pt denies Chest pain, worsening SOB, DOE, wheezing, orthopnea, PND, worsening LE edema, palpitations, dizziness or syncope.  Pt denies neurological change such as new headache, facial or extremity weakness.  Pt denies polydipsia, polyuria, or low sugar symptoms. Pt states overall good compliance with treatment and medications, good tolerability, and has been trying to follow appropriate diet.  Pt denies worsening depressive symptoms, suicidal ideation or panic. No fever, night sweats, wt loss, loss of appetite, or other constitutional symptoms.  Pt states good ability with ADL's, has low fall risk, home safety reviewed and adequate, no other significant changes in hearing or vision, and only occasionally active with exercise.  Has ongoing ED issues, viagra and cialis not working well. Has f/u appt with urology soon. Causing marital conflict. Past Medical History  Diagnosis Date  . Paroxysmal atrial fibrillation ([place]) 12/15/2007  . CHEST PAIN-PRECORDIAL 05/25/2009  . ERECTILE DYSFUNCTION, ORGANIC 01/16/2010  . HYPERLIPIDEMIA 12/15/2007  . HYPERTENSION 12/15/2007  . HYPOKALEMIA 12/15/2007  . POSTURAL LIGHTHEADEDNESS 07/06/2008  . Atrial flutter ([place])   . Internal hemorrhoids    Past Surgical History  Procedure Laterality Date  . Inguinal herniorrhapy  6/08  . Hernia repair    . Elbow surgery  bone spur  . Tee without cardioversion  08/12/2011    Procedure: TRANSESOPHAGEAL ECHOCARDIOGRAM (TEE);  Surgeon: Fay Records, MD;  Location: [facility] ENDOSCOPY;  Service: Cardiovascular;  Laterality: N/A;  . Atrial fibrillation and atrial flutter ablation  08/13/11    PVI and CTI ablation by Dr Rayann Heman  . Cardioversion  11/21/2011    Procedure: CARDIOVERSION;  Surgeon: Larey Dresser, MD;  Location: [place];  Service: Cardiovascular;  Laterality: N/A;  . Ablation of  dysrhythmic focus  12/27/2011    CTI repeat ablation by Dr Rayann Heman  . Atrial fibrillation ablation N/A 08/13/2011    Procedure: ATRIAL FIBRILLATION ABLATION;  Surgeon: Thompson Grayer, MD;  Location: [facility] CATH LAB;  Service: Cardiovascular;  Laterality: N/A;  . Atrial flutter ablation N/A 12/27/2011    Procedure: ATRIAL FLUTTER ABLATION;  Surgeon: Thompson Grayer, MD;  Location: [facility] CATH LAB;  Service: Cardiovascular;  Laterality: N/A;    reports that he quit smoking about 4 years ago. His smoking use included Cigarettes. He has a 4.5 pack-year smoking history. He has never used smokeless tobacco. He reports that he drinks about 0.6 oz of alcohol per week. He reports that he does not use illicit drugs. family history includes Heart disease in his sister; Sleep apnea in his sister; Stroke in his mother; Thyroid disease in his sister. There is no history of Anesthesia problems. Allergies  Allergen Reactions  . Ibuprofen Other (See Comments)    REACTION: rapid heart beat   Current Outpatient Prescriptions on File Prior to Visit  Medication Sig Dispense Refill  . amLODipine (NORVASC) 2.5 MG tablet Take 1 tablet (2.5 mg total) by mouth daily. 90 tablet 3  . atorvastatin (LIPITOR) 20 MG tablet Take 20 mg by mouth daily.    Marland Kitchen diltiazem (CARDIZEM CD) 120 MG 24 hr capsule Take 1 capsule (120 mg total) by mouth daily. 90 capsule 3  . diltiazem (CARDIZEM) 30 MG tablet Take one tablet by mouth every 6 hours as needed for elevated heart rate    . doxazosin (CARDURA) 4 MG tablet Take 1  tablet (4 mg total) by mouth at bedtime. 90 tablet 3  . flecainide (TAMBOCOR) 100 MG tablet Take 1 tablet (100 mg total) by mouth 2 (two) times daily. 60 tablet 6  . irbesartan (AVAPRO) 300 MG tablet TAKE 1 TABLET BY MOUTH EVERY MORNING 90 tablet 3  . Multiple Vitamins-Minerals (MULTIVITAMIN ADULTS 50+ PO) Take 1 capsule by mouth daily.    . pantoprazole (PROTONIX) 40 MG tablet TAKE 1 TABLET (40 MG TOTAL) BY MOUTH DAILY. 90 tablet 3  .  sildenafil (REVATIO) 20 MG tablet Take 20 mg by mouth 3 (three) times daily as needed.     . warfarin (COUMADIN) 5 MG tablet TAKE AS DIRECTED BY ANTICOAGULATION CLINIC 135 tablet 0   No current facility-administered medications on file prior to visit.    Review of Systems Constitutional: Negative for increased diaphoresis, other activity, appetite or siginficant weight change other than noted HENT: Negative for worsening hearing loss, ear pain, facial swelling, mouth sores and neck stiffness.   Eyes: Negative for other worsening pain, redness or visual disturbance.  Respiratory: Negative for shortness of breath and wheezing  Cardiovascular: Negative for chest pain and palpitations.  Gastrointestinal: Negative for diarrhea, blood in stool, abdominal distention or other pain Genitourinary: Negative for hematuria, flank pain or change in urine volume.  Musculoskeletal: Negative for myalgias or other joint complaints.  Skin: Negative for color change and wound or drainage.  Neurological: Negative for syncope and numbness. other than noted Hematological: Negative for adenopathy. or other swelling Psychiatric/Behavioral: Negative for hallucinations, SI, self-injury, decreased concentration or other worsening agitation.      Objective:   Physical Exam BP 118/68 mmHg  Pulse 66  Temp(Src) 97.9 F (36.6 C) (Oral)  Ht 6\' 1"  (1.854 m)  Wt 193 lb 4 oz (87.658 kg)  BMI 25.50 kg/m2  SpO2 98% VS noted,  Constitutional: Pt is oriented to person, place, and time. Appears well-developed and well-nourished, in no significant distress Head: Normocephalic and atraumatic.  Right Ear: External ear normal.  Left Ear: External ear normal.  Nose: Nose normal.  Mouth/Throat: Oropharynx is clear and moist.  Eyes: Conjunctivae and EOM are normal. Pupils are equal, round, and reactive to light.  Neck: Normal range of motion. Neck supple. No JVD present. No tracheal deviation present or significant neck LA or  mass Cardiovascular: Normal rate, regular rhythm, normal heart sounds and intact distal pulses.   Pulmonary/Chest: Effort normal and breath sounds without rales or wheezing  Abdominal: Soft. Bowel sounds are normal. NT. No HSM  Musculoskeletal: Normal range of motion. Exhibits no edema.  Lymphadenopathy:  Has no cervical adenopathy.  Neurological: Pt is alert and oriented to person, place, and time. Pt has normal reflexes. No cranial nerve deficit. Motor grossly intact Skin: Skin is warm and dry. No rash noted.  Psychiatric:  Has normal mood and affect. Behavior is normal.    Lab Results  Component Value Date   WBC 9.4 07/14/2014   HGB 14.7 07/14/2014   HCT 43.0 07/14/2014   PLT 211.0 07/14/2014   GLUCOSE 88 07/14/2014   CHOL 158 07/14/2014   TRIG 96.0 07/14/2014   HDL 65.90 07/14/2014   LDLDIRECT 115.7 07/13/2013   LDLCALC 73 07/14/2014   ALT 26 07/14/2014   AST 16 07/14/2014   NA 139 07/14/2014   K 3.9 07/14/2014   CL 106 07/14/2014   CREATININE 1.03 07/14/2014   BUN 23 07/14/2014   CO2 27 07/14/2014   TSH 0.86 07/14/2014   PSA 0.53  07/13/2013   INR 2.6 05/24/2015              [address], [place] [id number]              [id number]  ------------------------------------------------------------------- Transthoracic Echocardiography  Patient:  Harrel, Pelfrey MR #:    [id number] Study Date: 05/24/2015 Gender:   M Age:    78 Height:   182.9 cm Weight:   86.2 kg BSA:    2.1 m^2 Pt. Status: Room:  ATTENDING  Kirk Ruths ORDERING   Thompson Grayer, MD REFERRING  Thompson Grayer, MD SONOGRAPHER Cindy Hazy, RDCS PERFORMING  [facility], Outpatient  cc:  ------------------------------------------------------------------- LV EF: 55% -  60%  ------------------------------------------------------------------- Indications:   I48.92 Paroxysmal Atrial  Fibrillation.  ------------------------------------------------------------------- History:  PMH: Acquired from the patient and from the patient&'s chart. Risk factors: Hypertension. Dyslipidemia.  ------------------------------------------------------------------- Study Conclusions  - Left ventricle: The cavity size was normal. Wall thickness was normal. Systolic function was normal. The estimated ejection fraction was in the range of 55% to 60%. Wall motion was normal; there were no regional wall motion abnormalities. - Mitral valve: There was mild regurgitation. - Right ventricle: The cavity size was mildly dilated. - Right atrium: The atrium was mildly dilated.  Impressions:  - Normal LV systolic function; mild MR; mild RAE and RVE; trace TR.    Assessment & Plan:

## 2015-07-18 NOTE — Progress Notes (Signed)
Pre visit review using our clinic review tool, if applicable. No additional management support is needed unless otherwise documented below in the visit note. 

## 2015-07-18 NOTE — Patient Instructions (Addendum)
Please continue all other medications as before, and refills have been done if requested.  Please have the pharmacy call with any other refills you may need.  Please continue your efforts at being more active, low cholesterol diet, and weight control.  You are otherwise up to date with prevention measures today.  Please keep your appointments with your specialists as you may have planned, including calling for appt with Urology  You will be contacted regarding the referral for: colonosocopy  Please go to the LAB in the Basement (turn left off the elevator) for the tests to be done today  You will be contacted by phone if any changes need to be made immediately.  Otherwise, you will receive a letter about your results with an explanation, but please check with MyChart first.  Please remember to sign up for MyChart if you have not done so, as this will be important to you in the future with finding out test results, communicating by private email, and scheduling acute appointments online when needed.  Please return in 1 year for your yearly visit, or sooner if needed, with Lab testing done 3-5 days before

## 2015-07-19 LAB — HEPATITIS C ANTIBODY: HCV AB: NEGATIVE

## 2015-08-04 ENCOUNTER — Other Ambulatory Visit: Payer: Self-pay | Admitting: Internal Medicine

## 2015-08-14 ENCOUNTER — Other Ambulatory Visit: Payer: Self-pay | Admitting: Internal Medicine

## 2015-08-16 ENCOUNTER — Telehealth: Payer: Self-pay | Admitting: Pharmacist

## 2015-08-16 NOTE — Telephone Encounter (Signed)
LMOM for pt to call and schedule Coumadin check, has not been seen in 3 months.

## 2015-08-17 ENCOUNTER — Encounter: Payer: Self-pay | Admitting: Gastroenterology

## 2015-08-18 ENCOUNTER — Other Ambulatory Visit: Payer: Self-pay | Admitting: Internal Medicine

## 2015-09-11 ENCOUNTER — Ambulatory Visit (INDEPENDENT_AMBULATORY_CARE_PROVIDER_SITE_OTHER): Payer: BLUE CROSS/BLUE SHIELD

## 2015-09-11 DIAGNOSIS — I4819 Other persistent atrial fibrillation: Secondary | ICD-10-CM

## 2015-09-11 DIAGNOSIS — Z5181 Encounter for therapeutic drug level monitoring: Secondary | ICD-10-CM | POA: Diagnosis not present

## 2015-09-11 DIAGNOSIS — I4891 Unspecified atrial fibrillation: Secondary | ICD-10-CM | POA: Diagnosis not present

## 2015-09-11 DIAGNOSIS — I481 Persistent atrial fibrillation: Secondary | ICD-10-CM | POA: Diagnosis not present

## 2015-09-11 LAB — POCT INR: INR: 3.1

## 2015-09-25 ENCOUNTER — Other Ambulatory Visit: Payer: Self-pay | Admitting: Internal Medicine

## 2015-09-30 ENCOUNTER — Other Ambulatory Visit: Payer: Self-pay | Admitting: Internal Medicine

## 2015-10-09 ENCOUNTER — Ambulatory Visit (INDEPENDENT_AMBULATORY_CARE_PROVIDER_SITE_OTHER): Payer: BLUE CROSS/BLUE SHIELD | Admitting: Pharmacist

## 2015-10-09 DIAGNOSIS — Z5181 Encounter for therapeutic drug level monitoring: Secondary | ICD-10-CM | POA: Diagnosis not present

## 2015-10-09 DIAGNOSIS — I481 Persistent atrial fibrillation: Secondary | ICD-10-CM | POA: Diagnosis not present

## 2015-10-09 DIAGNOSIS — I4819 Other persistent atrial fibrillation: Secondary | ICD-10-CM

## 2015-10-09 DIAGNOSIS — I4891 Unspecified atrial fibrillation: Secondary | ICD-10-CM

## 2015-10-09 LAB — POCT INR: INR: 3.4

## 2015-10-10 ENCOUNTER — Ambulatory Visit (INDEPENDENT_AMBULATORY_CARE_PROVIDER_SITE_OTHER): Payer: BLUE CROSS/BLUE SHIELD | Admitting: Gastroenterology

## 2015-10-10 ENCOUNTER — Telehealth: Payer: Self-pay | Admitting: *Deleted

## 2015-10-10 ENCOUNTER — Encounter: Payer: Self-pay | Admitting: Gastroenterology

## 2015-10-10 VITALS — BP 110/76 | HR 60 | Ht 71.25 in | Wt 200.5 lb

## 2015-10-10 DIAGNOSIS — Z7901 Long term (current) use of anticoagulants: Secondary | ICD-10-CM

## 2015-10-10 DIAGNOSIS — Z1212 Encounter for screening for malignant neoplasm of rectum: Secondary | ICD-10-CM

## 2015-10-10 DIAGNOSIS — R140 Abdominal distension (gaseous): Secondary | ICD-10-CM | POA: Diagnosis not present

## 2015-10-10 DIAGNOSIS — K219 Gastro-esophageal reflux disease without esophagitis: Secondary | ICD-10-CM

## 2015-10-10 DIAGNOSIS — Z1211 Encounter for screening for malignant neoplasm of colon: Secondary | ICD-10-CM | POA: Diagnosis not present

## 2015-10-10 DIAGNOSIS — Z5181 Encounter for therapeutic drug level monitoring: Secondary | ICD-10-CM | POA: Diagnosis not present

## 2015-10-10 MED ORDER — NA SULFATE-K SULFATE-MG SULF 17.5-3.13-1.6 GM/177ML PO SOLN: 1.0000 | Freq: Once | ORAL | Status: DC

## 2015-10-10 MED ORDER — RANITIDINE HCL 150 MG PO TABS: 150.0000 mg | ORAL_TABLET | Freq: Two times a day (BID) | ORAL | Status: DC

## 2015-10-10 MED ORDER — DICYCLOMINE HCL 10 MG PO CAPS: 10.0000 mg | ORAL_CAPSULE | Freq: Three times a day (TID) | ORAL | Status: DC

## 2015-10-10 NOTE — Telephone Encounter (Signed)
  10/10/2015   RE: [name] [name] DOB: 12/07/1960 MRN: [id number]   Dear Rayann Heman,   We have scheduled the above patient for an endoscopic procedure. Our records show that he is on anticoagulation therapy.   Please advise as to how long the patient may come off his therapy of Coumadin prior to the procedure, which is scheduled for 11/02/2015.  Please fax back/ or route the completed form to [facility] at [phone].   Sincerely,    Genella Mech

## 2015-10-10 NOTE — Patient Instructions (Addendum)
You have been scheduled for a colonoscopy. Please follow written instructions given to you at your visit today.  Please pick up your prep supplies at the pharmacy within the next 1-3 days. If you use inhalers (even only as needed), please bring them with you on the day of your procedure. Your physician has requested that you go to www.startemmi.com and enter the access code given to you at your visit today. This web site gives a general overview about your procedure. However, you should still follow specific instructions given to you by our office regarding your preparation for the procedure.  You will be contacted by our office prior to your procedure for directions on holding your Plavix.  If you do not hear from our office 1 week prior to your scheduled procedure, please call [phone] to discuss.   We will send Bentyl and Zantac to your pharmacy  [place] map diet given   You will be contaced by our office prior to your procedure for directions on holding your Coumadin/Warfarin.  If you do not hear from our office 1 week prior to your scheduled procedure, please call [phone] to discuss.

## 2015-10-10 NOTE — Progress Notes (Signed)
HPI :  55 y/o male here to discuss CRC screening and recent symptoms he has experienced. He is a new patient to our clinic. He has a history of AF on coumadin.  He reports some intermittent bloating in his abdomen in both upper and lower abdomen. He usually has a BM twice per day. He reports having hard stools at times, but also some on the softer side. When he has a bowel movement it can take away his symptoms of gas and bloating. He thinks dairy and certain vegetables can sometimes increase this symptom. No blood in the stools. No weight loss, but tries to eat well to maintain it, he thinks it is harder to gain weight than previously. He is eating well. No vomiting postprandially. No postrpandially pains. Father passed away from "hemorrhaging of the stomach" 50 years ago, unclear diagnosis, he thinks maybe PUD. No known FH of CRC.   Last colonoscopy was done in 2005 which was a normal exam, history of hemorrhoids.   He takes coumadin for history of atrial fibrillation. No history of CVA. He also takes flecanide for AF.   He also has a history of protonix use for GERD. He has been on this for a year but stopped it for the past 2-3 weeks and has questions about the side effects. He has taken this for reflux in the past, has been on it for a year or so and it has worked well for his symptoms of pyrosis. Since he has stopped it he has felt well and GERD is not bothering him.    Past Medical History  Diagnosis Date  . Paroxysmal atrial fibrillation ([place]) 12/15/2007  . CHEST PAIN-PRECORDIAL 05/25/2009  . ERECTILE DYSFUNCTION, ORGANIC 01/16/2010  . HYPERLIPIDEMIA 12/15/2007  . HYPERTENSION 12/15/2007  . HYPOKALEMIA 12/15/2007  . POSTURAL LIGHTHEADEDNESS 07/06/2008  . Atrial flutter ([place])   . Internal hemorrhoids      Past Surgical History  Procedure Laterality Date  . Inguinal hernia repair  6/08  . Elbow surgery Right bone spur  . Tee without cardioversion  08/12/2011    Procedure:  TRANSESOPHAGEAL ECHOCARDIOGRAM (TEE);  Surgeon: Fay Records, MD;  Location: [facility] ENDOSCOPY;  Service: Cardiovascular;  Laterality: N/A;  . Atrial fibrillation and atrial flutter ablation  08/13/11    PVI and CTI ablation by Dr Rayann Heman  . Cardioversion  11/21/2011    Procedure: CARDIOVERSION;  Surgeon: Larey Dresser, MD;  Location: [place];  Service: Cardiovascular;  Laterality: N/A;  . Ablation of dysrhythmic focus  12/27/2011    CTI repeat ablation by Dr Rayann Heman  . Atrial fibrillation ablation N/A 08/13/2011    Procedure: ATRIAL FIBRILLATION ABLATION;  Surgeon: Thompson Grayer, MD;  Location: [facility] CATH LAB;  Service: Cardiovascular;  Laterality: N/A;  . Atrial flutter ablation N/A 12/27/2011    Procedure: ATRIAL FLUTTER ABLATION;  Surgeon: Thompson Grayer, MD;  Location: [facility] CATH LAB;  Service: Cardiovascular;  Laterality: N/A;   Family History  Problem Relation Age of Onset  . Stroke Mother   . Heart disease Sister   . Anesthesia problems Neg Hx   . Thyroid disease Sister   . Sleep apnea Sister   . Other Father     deceased stomach hemorrhage   Social History  Substance Use Topics  . Smoking status: Former Smoker -- 0.30 packs/day for 15 years    Types: Cigarettes    Quit date: 12/17/2010  . Smokeless tobacco: Never Used  . Alcohol Use: 0.6 oz/week  1 Glasses of wine per week     Comment: rare 2 per month   Current Outpatient Prescriptions  Medication Sig Dispense Refill  . amLODipine (NORVASC) 2.5 MG tablet Take 1 tablet (2.5 mg total) by mouth daily. 90 tablet 3  . atorvastatin (LIPITOR) 20 MG tablet TAKE 1 TABLET EVERY DAY 90 tablet 3  . diltiazem (CARDIZEM CD) 120 MG 24 hr capsule Take 1 capsule (120 mg total) by mouth daily. (Patient taking differently: Take 120 mg by mouth as needed. ) 90 capsule 3  . diltiazem (CARDIZEM) 30 MG tablet Take one tablet by mouth every 6 hours as needed for elevated heart rate    . doxazosin (CARDURA) 4 MG tablet TAKE 1 TABLET AT BEDTIME 90 tablet 3  .  flecainide (TAMBOCOR) 100 MG tablet TAKE 1 TABLET (100 MG TOTAL) BY MOUTH 2 (TWO) TIMES DAILY. 60 tablet 7  . irbesartan (AVAPRO) 300 MG tablet TAKE 1 TABLET BY MOUTH EVERY MORNING 90 tablet 3  . Multiple Vitamins-Minerals (MULTIVITAMIN ADULTS 50+ PO) Take 1 capsule by mouth daily.    . pantoprazole (PROTONIX) 40 MG tablet TAKE 1 TABLET EVERY DAY 90 tablet 3  . sildenafil (REVATIO) 20 MG tablet Take 20 mg by mouth 3 (three) times daily as needed.     . warfarin (COUMADIN) 5 MG tablet TAKE AS DIRECTED BY ANTICOAGULATION CLINIC 135 tablet 0   No current facility-administered medications for this visit.   Allergies  Allergen Reactions  . Ibuprofen Other (See Comments)    REACTION: rapid heart beat     Review of Systems: All systems reviewed and negative except where noted in HPI.   Lab Results  Component Value Date   WBC 9.0 07/18/2015   HGB 14.9 07/18/2015   HCT 44.2 07/18/2015   MCV 88.5 07/18/2015   PLT 228.0 07/18/2015    Lab Results  Component Value Date   CREATININE 1.17 07/18/2015   BUN 17 07/18/2015   NA 140 07/18/2015   K 4.1 07/18/2015   CL 105 07/18/2015   CO2 29 07/18/2015    Lab Results  Component Value Date   CREATININE 1.17 07/18/2015   BUN 17 07/18/2015   NA 140 07/18/2015   K 4.1 07/18/2015   CL 105 07/18/2015   CO2 29 07/18/2015   Lab Results  Component Value Date   ALT 30 07/18/2015   AST 19 07/18/2015   ALKPHOS 60 07/18/2015   BILITOT 0.7 07/18/2015     Physical Exam: BP 110/76 mmHg  Pulse 60  Ht 5' 11.25" (1.81 m)  Wt 200 lb 8 oz (90.946 kg)  BMI 27.76 kg/m2 Constitutional: Pleasant,well-developed,male in no acute distress. HEENT: Normocephalic and atraumatic. Conjunctivae are normal. No scleral icterus. Neck supple.  Cardiovascular: Normal rate, irregularly irregular Pulmonary/chest: Effort normal and breath sounds normal. No wheezing, rales or rhonchi. Abdominal: Soft, nondistended, nontender. Bowel sounds active throughout. There  are no masses palpable. No hepatomegaly. Extremities: no edema Lymphadenopathy: No cervical adenopathy noted. Neurological: Alert and oriented to person place and time. Skin: Skin is warm and dry. No rashes noted. Psychiatric: Normal mood and affect. Behavior is normal.   ASSESSMENT AND PLAN: 55 y/o male with a history of AF on coumadin, here to discuss the following:  Colon cancer screening - I discussed modalities for screening with him to include optical colonoscopy and stool testing. Given his recent symptoms as below, he wanted to pursue optical colonoscopy. He understands he will need to hold coumadin for this exam  and understands the risks of doing this. The indications, risks, and benefits of colonoscopy were explained to the patient in detail. Risks include but are not limited to bleeding, perforation, adverse reaction to medications, and cardiopulmonary compromise. Sequelae include but are not limited to the possibility of surgery, hospitalization, and mortality. The patient verbalized understanding and wished to proceed. All questions answered, referred to the scheduler and bowel prep ordered. Further recommendations pending results of the exam. We will touch base with prescribing provider of coumadin to see if okay to hold. He can take an aspirin around the time of the procedure if he wishes.   Bloating - suspect dietary related, perhaps IBS. I discussed options for treatment at this time. Given his dietary triggers will try a low FODMOP diet and provided him a handout for this. I otherwise will try some bentyl to use PRN for cramps. We will await colonoscopy result. If symptoms persist he can follow up as needed  GERD - previously on PPI and has since stopped it for 2-3 weeks and feeling well. We discussed risks / benefits of PPI use. He will continue to hold PPI for now given he is feeling well. I provided him some zantac to use PRN for reflux symptoms, and can hold off on PPI unless  having persistent or severe symptoms. He can follow up as needed for this issue.    [place]ellar, MD [facility] Pager [phone]  CC: Biagio Borg, MD

## 2015-10-11 NOTE — Telephone Encounter (Signed)
Pt on Coumadin for afib with CHADS2 score of 1 (HTN). Ok for pt to hold Coumadin x5 days prior to endoscopy and restart in the PM on 6/1 after procedure. Routed clearance back to [employer].

## 2015-10-12 NOTE — Telephone Encounter (Signed)
Called patient to inform ok to hold coumadin 5 days before procedure, he stated coumadin clinic had already called and informed him when to stop and when to start back up

## 2015-10-14 ENCOUNTER — Other Ambulatory Visit: Payer: Self-pay | Admitting: Internal Medicine

## 2015-10-19 NOTE — Telephone Encounter (Signed)
Will forward to anticoagulation clinic for bridging per protocol

## 2015-11-02 ENCOUNTER — Encounter: Payer: Self-pay | Admitting: Gastroenterology

## 2015-11-02 ENCOUNTER — Ambulatory Visit (AMBULATORY_SURGERY_CENTER): Payer: BLUE CROSS/BLUE SHIELD | Admitting: Gastroenterology

## 2015-11-02 VITALS — BP 116/81 | HR 73 | Temp 96.8°F | Resp 9 | Ht 71.25 in | Wt 200.0 lb

## 2015-11-02 DIAGNOSIS — Z1211 Encounter for screening for malignant neoplasm of colon: Secondary | ICD-10-CM | POA: Diagnosis present

## 2015-11-02 DIAGNOSIS — D125 Benign neoplasm of sigmoid colon: Secondary | ICD-10-CM | POA: Diagnosis not present

## 2015-11-02 DIAGNOSIS — D123 Benign neoplasm of transverse colon: Secondary | ICD-10-CM

## 2015-11-02 MED ORDER — SODIUM CHLORIDE 0.9 % IV SOLN: 500.0000 mL | INTRAVENOUS | Status: DC

## 2015-11-02 NOTE — Patient Instructions (Signed)
Impression/recommendations:  Polyps (handout given) Diverticulosis (handout given) High Fiber Diet (handout given) Hemorrhoids (handout given)  No aspirin, aspirin containing products or NSAIDS for 2 weeks. Tylenol only if needed until 11/17/15.  Resume Coumadin 11/03/15.  YOU HAD AN ENDOSCOPIC PROCEDURE TODAY AT [facility]:   Refer to the procedure report that was given to you for any specific questions about what was found during the examination.  If the procedure report does not answer your questions, please call your gastroenterologist to clarify.  If you requested that your care partner not be given the details of your procedure findings, then the procedure report has been included in a sealed envelope for you to review at your convenience later.  YOU SHOULD EXPECT: Some feelings of bloating in the abdomen. Passage of more gas than usual.  Walking can help get rid of the air that was put into your GI tract during the procedure and reduce the bloating. If you had a lower endoscopy (such as a colonoscopy or flexible sigmoidoscopy) you may notice spotting of blood in your stool or on the toilet paper. If you underwent a bowel prep for your procedure, you may not have a normal bowel movement for a few days.  Please Note:  You might notice some irritation and congestion in your nose or some drainage.  This is from the oxygen used during your procedure.  There is no need for concern and it should clear up in a day or so.  SYMPTOMS TO REPORT IMMEDIATELY:   Following lower endoscopy (colonoscopy or flexible sigmoidoscopy):  Excessive amounts of blood in the stool  Significant tenderness or worsening of abdominal pains  Swelling of the abdomen that is new, acute  Fever of 100F or higher  For urgent or emergent issues, a gastroenterologist can be reached at any hour by calling [phone].   DIET: Your first meal following the procedure should be a small meal and then it is  ok to progress to your normal diet. Heavy or fried foods are harder to digest and may make you feel nauseous or bloated.  Likewise, meals heavy in dairy and vegetables can increase bloating.  Drink plenty of fluids but you should avoid alcoholic beverages for 24 hours.  ACTIVITY:  You should plan to take it easy for the rest of today and you should NOT DRIVE or use heavy machinery until tomorrow (because of the sedation medicines used during the test).    FOLLOW UP: Our staff will call the number listed on your records the next business day following your procedure to check on you and address any questions or concerns that you may have regarding the information given to you following your procedure. If we do not reach you, we will leave a message.  However, if you are feeling well and you are not experiencing any problems, there is no need to return our call.  We will assume that you have returned to your regular daily activities without incident.  If any biopsies were taken you will be contacted by phone or by letter within the next 1-3 weeks.  Please call us at [phone] if you have not heard about the biopsies in 3 weeks.    SIGNATURES/CONFIDENTIALITY: You and/or your care partner have signed paperwork which will be entered into your electronic medical record.  These signatures attest to the fact that that the information above on your After Visit Summary has been reviewed and is understood.  Full responsibility of  the confidentiality of this discharge information lies with you and/or your care-partner. 

## 2015-11-02 NOTE — Op Note (Signed)
[facility] Patient Name: Kelcie Loo Procedure Date: 11/02/2015 1:24 PM MRN: [id number] Endoscopist: Remo Lipps P. Havery Moros , MD Age: 55 Referring MD:  Date of Birth: Feb 02, 1961 Gender: Male Procedure:                Colonoscopy Indications:              Screening for malignant neoplasm in the colon Medicines:                Monitored Anesthesia Care Procedure:                Pre-Anesthesia Assessment:                           - Prior to the procedure, a History and Physical                            was performed, and patient medications and                            allergies were reviewed. The patient's tolerance of                            previous anesthesia was also reviewed. The risks                            and benefits of the procedure and the sedation                            options and risks were discussed with the patient.                            All questions were answered, and informed consent                            was obtained. Prior Anticoagulants: The patient has                            taken Coumadin (warfarin), last dose was 5 days                            prior to procedure. ASA Grade Assessment: III - A                            patient with severe systemic disease. After                            reviewing the risks and benefits, the patient was                            deemed in satisfactory condition to undergo the                            procedure.  After obtaining informed consent, the colonoscope                            was passed under direct vision. Throughout the                            procedure, the patient's blood pressure, pulse, and                            oxygen saturations were monitored continuously. The                            Model CF-HQ190L [id number]) scope was introduced                            through the anus and advanced to the the terminal             ileum, with identification of the appendiceal                            orifice and IC valve. The colonoscopy was performed                            without difficulty. The patient tolerated the                            procedure well. The quality of the bowel                            preparation was good. The terminal ileum, ileocecal                            valve, appendiceal orifice, and rectum were                            photographed. Scope In: 1:26:33 PM Scope Out: 1:43:54 PM Scope Withdrawal Time: 0 hours 15 minutes 26 seconds  Total Procedure Duration: 0 hours 17 minutes 21 seconds  Findings:                 The perianal and digital rectal examinations were                            normal.                           A 4 mm polyp was found in the transverse colon. The                            polyp was sessile. The polyp was removed with a                            cold snare. Resection and retrieval were complete.  A 4 mm polyp was found in the sigmoid colon. The                            polyp was sessile. The polyp was removed with a                            cold snare. Resection and retrieval were complete.                           A few small-mouthed diverticula were found in the                            transverse colon and ascending colon.                           Non-bleeding internal hemorrhoids were found during                            retroflexion.                           The terminal ileum appeared normal.                           The exam was otherwise without abnormality. Complications:            No immediate complications. Estimated blood loss:                            Minimal. Estimated Blood Loss:     Estimated blood loss was minimal. Impression:               - One 4 mm polyp in the transverse colon, removed                            with a cold snare. Resected and retrieved.                            - One 4 mm polyp in the sigmoid colon, removed with                            a cold snare. Resected and retrieved.                           - Diverticulosis in the transverse colon and in the                            ascending colon.                           - Non-bleeding internal hemorrhoids.                           - The examined portion of the ileum was normal.                           -  The examination was otherwise normal. Recommendation:           - Patient has a contact number available for                            emergencies. The signs and symptoms of potential                            delayed complications were discussed with the                            patient. Return to normal activities tomorrow.                            Written discharge instructions were provided to the                            patient.                           - Resume previous diet.                           - Continue present medications.                           - Resume Coumadin tomorrow                           - No aspirin, ibuprofen, naproxen, or other                            non-steroidal anti-inflammatory drugs for 2 weeks                            after polyp removal.                           - Await pathology results.                           - Repeat colonoscopy is recommended for                            surveillance. The colonoscopy date will be                            determined after pathology results from today's                            exam become available for review. Remo Lipps P. Armbruster, MD 11/02/2015 1:48:23 PM This report has been signed electronically.

## 2015-11-02 NOTE — Progress Notes (Signed)
Called to room to assist during endoscopic procedure.  Patient ID and intended procedure confirmed with present staff. Received instructions for my participation in the procedure from the performing physician.  

## 2015-11-02 NOTE — Progress Notes (Signed)
Report to PACU, RN, vss, BBS= Clear.  

## 2015-11-03 ENCOUNTER — Telehealth: Payer: Self-pay | Admitting: *Deleted

## 2015-11-03 NOTE — Telephone Encounter (Signed)
Message left to call if any questions or concerns. [phone]

## 2015-11-09 ENCOUNTER — Encounter: Payer: Self-pay | Admitting: Gastroenterology

## 2015-11-14 ENCOUNTER — Ambulatory Visit (INDEPENDENT_AMBULATORY_CARE_PROVIDER_SITE_OTHER): Payer: BLUE CROSS/BLUE SHIELD | Admitting: *Deleted

## 2015-11-14 DIAGNOSIS — I4891 Unspecified atrial fibrillation: Secondary | ICD-10-CM | POA: Diagnosis not present

## 2015-11-14 DIAGNOSIS — Z5181 Encounter for therapeutic drug level monitoring: Secondary | ICD-10-CM

## 2015-11-14 DIAGNOSIS — I4819 Other persistent atrial fibrillation: Secondary | ICD-10-CM

## 2015-11-14 DIAGNOSIS — I481 Persistent atrial fibrillation: Secondary | ICD-10-CM | POA: Diagnosis not present

## 2015-11-14 LAB — POCT INR: INR: 1.5

## 2015-11-23 ENCOUNTER — Ambulatory Visit (INDEPENDENT_AMBULATORY_CARE_PROVIDER_SITE_OTHER): Payer: BLUE CROSS/BLUE SHIELD | Admitting: *Deleted

## 2015-11-23 DIAGNOSIS — I4891 Unspecified atrial fibrillation: Secondary | ICD-10-CM | POA: Diagnosis not present

## 2015-11-23 DIAGNOSIS — Z5181 Encounter for therapeutic drug level monitoring: Secondary | ICD-10-CM

## 2015-11-23 DIAGNOSIS — I481 Persistent atrial fibrillation: Secondary | ICD-10-CM | POA: Diagnosis not present

## 2015-11-23 DIAGNOSIS — I4819 Other persistent atrial fibrillation: Secondary | ICD-10-CM

## 2015-11-23 LAB — POCT INR: INR: 2.9

## 2015-12-14 ENCOUNTER — Ambulatory Visit (INDEPENDENT_AMBULATORY_CARE_PROVIDER_SITE_OTHER): Payer: BLUE CROSS/BLUE SHIELD | Admitting: *Deleted

## 2015-12-14 DIAGNOSIS — I4891 Unspecified atrial fibrillation: Secondary | ICD-10-CM | POA: Diagnosis not present

## 2015-12-14 DIAGNOSIS — I481 Persistent atrial fibrillation: Secondary | ICD-10-CM | POA: Diagnosis not present

## 2015-12-14 DIAGNOSIS — Z5181 Encounter for therapeutic drug level monitoring: Secondary | ICD-10-CM

## 2015-12-14 DIAGNOSIS — I4819 Other persistent atrial fibrillation: Secondary | ICD-10-CM

## 2015-12-14 LAB — POCT INR: INR: 2.1

## 2015-12-30 ENCOUNTER — Other Ambulatory Visit: Payer: Self-pay | Admitting: Internal Medicine

## 2016-01-07 ENCOUNTER — Other Ambulatory Visit: Payer: Self-pay | Admitting: Internal Medicine

## 2016-02-26 ENCOUNTER — Telehealth: Payer: Self-pay | Admitting: *Deleted

## 2016-02-26 NOTE — Telephone Encounter (Signed)
Spoke with pt and informed that we need to check his INR as he has not been  seen since July 13th and he states he has enough coumadin to last this week and he will call back and make an appt for the end of the week Stressed to him the importance of his having INR checked this week and he states understanding

## 2016-02-27 ENCOUNTER — Other Ambulatory Visit: Payer: Self-pay | Admitting: *Deleted

## 2016-03-01 ENCOUNTER — Ambulatory Visit (INDEPENDENT_AMBULATORY_CARE_PROVIDER_SITE_OTHER): Payer: BLUE CROSS/BLUE SHIELD | Admitting: *Deleted

## 2016-03-01 DIAGNOSIS — I4891 Unspecified atrial fibrillation: Secondary | ICD-10-CM

## 2016-03-01 DIAGNOSIS — I481 Persistent atrial fibrillation: Secondary | ICD-10-CM | POA: Diagnosis not present

## 2016-03-01 DIAGNOSIS — Z5181 Encounter for therapeutic drug level monitoring: Secondary | ICD-10-CM

## 2016-03-01 DIAGNOSIS — I4819 Other persistent atrial fibrillation: Secondary | ICD-10-CM

## 2016-03-01 LAB — POCT INR: INR: 2.2

## 2016-03-01 MED ORDER — WARFARIN SODIUM 5 MG PO TABS: ORAL_TABLET | ORAL | 1 refills | Status: DC

## 2016-03-22 ENCOUNTER — Other Ambulatory Visit: Payer: Self-pay | Admitting: *Deleted

## 2016-03-22 MED ORDER — SILDENAFIL CITRATE 20 MG PO TABS: 20.0000 mg | ORAL_TABLET | Freq: Three times a day (TID) | ORAL | 0 refills | Status: DC | PRN

## 2016-03-25 DIAGNOSIS — N5201 Erectile dysfunction due to arterial insufficiency: Secondary | ICD-10-CM | POA: Diagnosis not present

## 2016-03-25 DIAGNOSIS — N400 Enlarged prostate without lower urinary tract symptoms: Secondary | ICD-10-CM | POA: Diagnosis not present

## 2016-03-29 ENCOUNTER — Ambulatory Visit (INDEPENDENT_AMBULATORY_CARE_PROVIDER_SITE_OTHER): Payer: BLUE CROSS/BLUE SHIELD | Admitting: *Deleted

## 2016-03-29 DIAGNOSIS — I481 Persistent atrial fibrillation: Secondary | ICD-10-CM | POA: Diagnosis not present

## 2016-03-29 DIAGNOSIS — Z5181 Encounter for therapeutic drug level monitoring: Secondary | ICD-10-CM

## 2016-03-29 DIAGNOSIS — I4891 Unspecified atrial fibrillation: Secondary | ICD-10-CM

## 2016-03-29 DIAGNOSIS — I4819 Other persistent atrial fibrillation: Secondary | ICD-10-CM

## 2016-03-29 LAB — POCT INR: INR: 1.4

## 2016-04-05 ENCOUNTER — Ambulatory Visit (INDEPENDENT_AMBULATORY_CARE_PROVIDER_SITE_OTHER): Payer: BLUE CROSS/BLUE SHIELD | Admitting: *Deleted

## 2016-04-05 ENCOUNTER — Encounter (INDEPENDENT_AMBULATORY_CARE_PROVIDER_SITE_OTHER): Payer: Self-pay

## 2016-04-05 DIAGNOSIS — I4819 Other persistent atrial fibrillation: Secondary | ICD-10-CM

## 2016-04-05 DIAGNOSIS — I4891 Unspecified atrial fibrillation: Secondary | ICD-10-CM | POA: Diagnosis not present

## 2016-04-05 DIAGNOSIS — I481 Persistent atrial fibrillation: Secondary | ICD-10-CM | POA: Diagnosis not present

## 2016-04-05 DIAGNOSIS — Z5181 Encounter for therapeutic drug level monitoring: Secondary | ICD-10-CM

## 2016-04-05 LAB — POCT INR: INR: 2.3

## 2016-04-24 ENCOUNTER — Other Ambulatory Visit: Payer: Self-pay | Admitting: Internal Medicine

## 2016-05-20 ENCOUNTER — Ambulatory Visit (INDEPENDENT_AMBULATORY_CARE_PROVIDER_SITE_OTHER): Payer: BLUE CROSS/BLUE SHIELD

## 2016-05-20 DIAGNOSIS — I4891 Unspecified atrial fibrillation: Secondary | ICD-10-CM

## 2016-05-20 DIAGNOSIS — I4819 Other persistent atrial fibrillation: Secondary | ICD-10-CM

## 2016-05-20 DIAGNOSIS — I481 Persistent atrial fibrillation: Secondary | ICD-10-CM | POA: Diagnosis not present

## 2016-05-20 DIAGNOSIS — Z5181 Encounter for therapeutic drug level monitoring: Secondary | ICD-10-CM | POA: Diagnosis not present

## 2016-05-20 LAB — POCT INR: INR: 2.9

## 2016-05-29 ENCOUNTER — Other Ambulatory Visit: Payer: Self-pay

## 2016-05-29 MED ORDER — DILTIAZEM HCL ER COATED BEADS 120 MG PO CP24
120.0000 mg | ORAL_CAPSULE | Freq: Every day | ORAL | 0 refills | Status: DC
Start: 2016-05-29 — End: 2016-06-24

## 2016-06-24 ENCOUNTER — Other Ambulatory Visit: Payer: Self-pay | Admitting: Internal Medicine

## 2016-06-27 ENCOUNTER — Ambulatory Visit (INDEPENDENT_AMBULATORY_CARE_PROVIDER_SITE_OTHER): Payer: BLUE CROSS/BLUE SHIELD | Admitting: *Deleted

## 2016-06-27 DIAGNOSIS — Z5181 Encounter for therapeutic drug level monitoring: Secondary | ICD-10-CM | POA: Diagnosis not present

## 2016-06-27 DIAGNOSIS — I4891 Unspecified atrial fibrillation: Secondary | ICD-10-CM

## 2016-06-27 DIAGNOSIS — I4819 Other persistent atrial fibrillation: Secondary | ICD-10-CM

## 2016-06-27 DIAGNOSIS — I481 Persistent atrial fibrillation: Secondary | ICD-10-CM

## 2016-06-27 LAB — POCT INR: INR: 2.2

## 2016-07-03 ENCOUNTER — Other Ambulatory Visit: Payer: Self-pay | Admitting: Internal Medicine

## 2016-07-14 ENCOUNTER — Other Ambulatory Visit: Payer: Self-pay | Admitting: Internal Medicine

## 2016-07-18 ENCOUNTER — Encounter: Payer: BLUE CROSS/BLUE SHIELD | Admitting: Internal Medicine

## 2016-08-05 ENCOUNTER — Other Ambulatory Visit: Payer: Self-pay | Admitting: *Deleted

## 2016-08-05 ENCOUNTER — Other Ambulatory Visit: Payer: Self-pay | Admitting: Internal Medicine

## 2016-08-06 ENCOUNTER — Ambulatory Visit (INDEPENDENT_AMBULATORY_CARE_PROVIDER_SITE_OTHER): Payer: BLUE CROSS/BLUE SHIELD | Admitting: Internal Medicine

## 2016-08-06 ENCOUNTER — Encounter: Payer: Self-pay | Admitting: Internal Medicine

## 2016-08-06 VITALS — BP 126/84 | HR 58 | Temp 98.6°F | Ht 72.0 in | Wt 190.0 lb

## 2016-08-06 DIAGNOSIS — R739 Hyperglycemia, unspecified: Secondary | ICD-10-CM | POA: Diagnosis not present

## 2016-08-06 DIAGNOSIS — Z0000 Encounter for general adult medical examination without abnormal findings: Secondary | ICD-10-CM | POA: Diagnosis not present

## 2016-08-06 DIAGNOSIS — I10 Essential (primary) hypertension: Secondary | ICD-10-CM | POA: Diagnosis not present

## 2016-08-06 MED ORDER — TADALAFIL 20 MG PO TABS: 20.0000 mg | ORAL_TABLET | Freq: Every day | ORAL | 11 refills | Status: DC | PRN

## 2016-08-06 MED ORDER — DILTIAZEM HCL ER COATED BEADS 120 MG PO CP24: ORAL_CAPSULE | ORAL | 3 refills | Status: DC

## 2016-08-06 NOTE — Progress Notes (Signed)
Subjective:    Patient ID: [name] [name], male    DOB: [birth year], 56 y.o.   MRN: [id number]  HPI  Here for wellness and f/u;  Overall doing ok;  Pt denies Chest pain, worsening SOB, DOE, wheezing, orthopnea, PND, worsening LE edema, palpitations, dizziness or syncope.  Pt denies neurological change such as new headache, facial or extremity weakness.  Pt denies polydipsia, polyuria, or low sugar symptoms. Pt states overall good compliance with treatment and medications, good tolerability, and has been trying to follow appropriate diet.  Pt denies worsening depressive symptoms, suicidal ideation or panic. No fever, night sweats, loss of appetite, or other constitutional symptoms.  Pt states good ability with ADL's, has low fall risk, home safety reviewed and adequate, no other significant changes in hearing or vision, and only occasionally active with exercise. Lost 10 lbs with better diet and more active at the gym.  Wt Readings from Last 3 Encounters:  08/06/16 190 lb (86.2 kg)  11/02/15 200 lb (90.7 kg)  10/10/15 200 lb 8 oz (90.9 kg)  Still with ongoing ED but meds still expensive.  Past Medical History:  Diagnosis Date  . Atrial flutter ([place])   . CHEST PAIN-PRECORDIAL 05/25/2009  . ERECTILE DYSFUNCTION, ORGANIC 01/16/2010  . GERD (gastroesophageal reflux disease)   . HYPERLIPIDEMIA 12/15/2007  . HYPERTENSION 12/15/2007  . HYPOKALEMIA 12/15/2007  . Internal hemorrhoids   . Paroxysmal atrial fibrillation ([place]) 12/15/2007  . POSTURAL LIGHTHEADEDNESS 07/06/2008   Past Surgical History:  Procedure Laterality Date  . ABLATION OF DYSRHYTHMIC FOCUS  12/27/2011   CTI repeat ablation by Dr Rayann Heman  . ATRIAL FIBRILLATION ABLATION N/A 08/13/2011   Procedure: ATRIAL FIBRILLATION ABLATION;  Surgeon: Thompson Grayer, MD;  Location: [facility] C[facility] LAB;  Service: Cardiovascular;  Laterality: N/A;  . atrial fibrillation and atrial flutter ablation  08/13/11   PVI and CTI ablation by Dr Rayann Heman  . ATRIAL FLUTTER  ABLATION N/A 12/27/2011   Procedure: ATRIAL FLUTTER ABLATION;  Surgeon: Thompson Grayer, MD;  Location: [facility] CATH LAB;  Service: Cardiovascular;  Laterality: N/A;  . CARDIOVERSION  11/21/2011   Procedure: CARDIOVERSION;  Surgeon: Larey Dresser, MD;  Location: [place];  Service: Cardiovascular;  Laterality: N/A;  . ELBOW SURGERY Right bone spur  . INGUINAL HERNIA REPAIR  6/08  . TEE WITHOUT CARDIOVERSION  08/12/2011   Procedure: TRANSESOPHAGEAL ECHOCARDIOGRAM (TEE);  Surgeon: Fay Records, MD;  Location: [facility] ENDOSCOPY;  Service: Cardiovascular;  Laterality: N/A;    reports that he quit smoking about 5 years ago. His smoking use included Cigarettes. He has a 4.50 pack-year smoking history. He has never used smokeless tobacco. He reports that he drinks about 0.6 oz of alcohol per week . He reports that he does not use drugs. family history includes Heart disease in his sister; Other in his father; Sleep apnea in his sister; Stroke in his mother; Thyroid disease in his sister. Allergies  Allergen Reactions  . Ibuprofen Other (See Comments)    REACTION: rapid heart beat   Current Outpatient Prescriptions on File Prior to Visit  Medication Sig Dispense Refill  . amLODipine (NORVASC) 2.5 MG tablet TAKE 1 TABLET EVERY DAY 90 tablet 3  . atorvastatin (LIPITOR) 20 MG tablet TAKE 1 TABLET EVERY DAY 90 tablet 3  . doxazosin (CARDURA) 4 MG tablet TAKE 1 TABLET AT BEDTIME 90 tablet 3  . flecainide (TAMBOCOR) 100 MG tablet Take 1 tablet (100 mg total) by mouth 2 (two) times daily. *Patient is overdue for  an appt and must call and schedule for further refills* 30 tablet 0  . irbesartan (AVAPRO) 300 MG tablet TAKE 1 TABLET BY MOUTH EVERY MORNING 90 tablet 1  . Multiple Vitamins-Minerals (MULTIVITAMIN ADULTS 50+ PO) Take 1 capsule by mouth daily.    Marland Kitchen warfarin (COUMADIN) 5 MG tablet TAKE AS DIRECTED BY COUMADIN CLINIC 50 tablet 1   No current facility-administered medications on file prior to visit.    Review of  Systems Constitutional: Negative for increased diaphoresis, or other activity, appetite or siginficant weight change other than noted HENT: Negative for worsening hearing loss, ear pain, facial swelling, mouth sores and neck stiffness.   Eyes: Negative for other worsening pain, redness or visual disturbance.  Respiratory: Negative for choking or stridor Cardiovascular: Negative for other chest pain and palpitations.  Gastrointestinal: Negative for worsening diarrhea, blood in stool, or abdominal distention Genitourinary: Negative for hematuria, flank pain or change in urine volume.  Musculoskeletal: Negative for myalgias or other joint complaints.  Skin: Negative for other color change and wound or drainage.  Neurological: Negative for syncope and numbness. other than noted Hematological: Negative for adenopathy. or other swelling Psychiatric/Behavioral: Negative for hallucinations, SI, self-injury, decreased concentration or other worsening agitation.  All other system neg per pt    Objective:   Physical Exam BP 126/84   Pulse (!) 58   Temp 98.6 F (37 C)   Ht 6' (1.829 m)   Wt 190 lb (86.2 kg)   SpO2 98%   BMI 25.77 kg/m  VS noted,  Constitutional: Pt is oriented to person, place, and time. Appears well-developed and well-nourished, in no significant distress Head: Normocephalic and atraumatic  Eyes: Conjunctivae and EOM are normal. Pupils are equal, round, and reactive to light Right Ear: External ear normal.  Left Ear: External ear normal Nose: Nose normal.  Mouth/Throat: Oropharynx is clear and moist  Neck: Normal range of motion. Neck supple. No JVD present. No tracheal deviation present or significant neck LA or mass Cardiovascular: Normal rate with irregular rhythm, normal heart sounds and intact distal pulses.   Pulmonary/Chest: Effort normal and breath sounds without rales or wheezing  Abdominal: Soft. Bowel sounds are normal. NT. No HSM  Musculoskeletal: Normal range  of motion. Exhibits no edema Lymphadenopathy: Has no cervical adenopathy.  Neurological: Pt is alert and oriented to person, place, and time. Pt has normal reflexes. No cranial nerve deficit. Motor grossly intact Skin: Skin is warm and dry. No rash noted or new ulcers Psychiatric:  Has normal mood and affect. Behavior is normal. No other exam findings    Assessment & Plan:

## 2016-08-06 NOTE — Patient Instructions (Addendum)
Please remember to contact your cardiologist for follow up. - Dr Rayann Heman  Please continue all other medications as before, and refills have been done if requested.  Please have the pharmacy call with any other refills you may need.  Please continue your efforts at being more active, low cholesterol diet, and weight control.  You are otherwise up to date with prevention measures today.  Please keep your appointments with your specialists as you may have planned  Please go to the LAB in the Basement (turn left off the elevator) for the tests to be done today  You will be contacted by phone if any changes need to be made immediately.  Otherwise, you will receive a letter about your results with an explanation, but please check with MyChart first.  Please remember to sign up for MyChart if you have not done so, as this will be important to you in the future with finding out test results, communicating by private email, and scheduling acute appointments online when needed.  Please return in 1 year for your yearly visit, or sooner if needed, with Lab testing done 3-5 days before

## 2016-08-08 ENCOUNTER — Ambulatory Visit (INDEPENDENT_AMBULATORY_CARE_PROVIDER_SITE_OTHER): Payer: BLUE CROSS/BLUE SHIELD | Admitting: *Deleted

## 2016-08-08 DIAGNOSIS — Z5181 Encounter for therapeutic drug level monitoring: Secondary | ICD-10-CM

## 2016-08-08 DIAGNOSIS — I481 Persistent atrial fibrillation: Secondary | ICD-10-CM | POA: Diagnosis not present

## 2016-08-08 DIAGNOSIS — I4891 Unspecified atrial fibrillation: Secondary | ICD-10-CM

## 2016-08-08 DIAGNOSIS — I4819 Other persistent atrial fibrillation: Secondary | ICD-10-CM

## 2016-08-08 LAB — POCT INR: INR: 2.7

## 2016-08-09 ENCOUNTER — Other Ambulatory Visit (INDEPENDENT_AMBULATORY_CARE_PROVIDER_SITE_OTHER): Payer: BLUE CROSS/BLUE SHIELD

## 2016-08-09 ENCOUNTER — Encounter: Payer: Self-pay | Admitting: Internal Medicine

## 2016-08-09 DIAGNOSIS — Z0000 Encounter for general adult medical examination without abnormal findings: Secondary | ICD-10-CM

## 2016-08-09 DIAGNOSIS — R739 Hyperglycemia, unspecified: Secondary | ICD-10-CM

## 2016-08-09 LAB — URINALYSIS, ROUTINE W REFLEX MICROSCOPIC
BILIRUBIN URINE: NEGATIVE
Ketones, ur: NEGATIVE
Leukocytes, UA: NEGATIVE
NITRITE: NEGATIVE
Specific Gravity, Urine: 1.025 (ref 1.000–1.030)
Total Protein, Urine: NEGATIVE
UROBILINOGEN UA: 0.2 (ref 0.0–1.0)
Urine Glucose: NEGATIVE
pH: 6 (ref 5.0–8.0)

## 2016-08-09 LAB — CBC WITH DIFFERENTIAL/PLATELET
BASOS PCT: 0.5 % (ref 0.0–3.0)
Basophils Absolute: 0 10*3/uL (ref 0.0–0.1)
EOS ABS: 0.2 10*3/uL (ref 0.0–0.7)
EOS PCT: 2.5 % (ref 0.0–5.0)
HEMATOCRIT: 43.3 % (ref 39.0–52.0)
HEMOGLOBIN: 14.7 g/dL (ref 13.0–17.0)
LYMPHS PCT: 29.6 % (ref 12.0–46.0)
Lymphs Abs: 2.3 10*3/uL (ref 0.7–4.0)
MCHC: 33.9 g/dL (ref 30.0–36.0)
MCV: 89.4 fl (ref 78.0–100.0)
MONO ABS: 0.8 10*3/uL (ref 0.1–1.0)
Monocytes Relative: 9.7 % (ref 3.0–12.0)
Neutro Abs: 4.6 10*3/uL (ref 1.4–7.7)
Neutrophils Relative %: 57.7 % (ref 43.0–77.0)
Platelets: 219 10*3/uL (ref 150.0–400.0)
RBC: 4.84 Mil/uL (ref 4.22–5.81)
RDW: 13.1 % (ref 11.5–15.5)
WBC: 7.9 10*3/uL (ref 4.0–10.5)

## 2016-08-09 LAB — BASIC METABOLIC PANEL
BUN: 18 mg/dL (ref 6–23)
CO2: 29 mEq/L (ref 19–32)
CREATININE: 1.21 mg/dL (ref 0.40–1.50)
Calcium: 9.8 mg/dL (ref 8.4–10.5)
Chloride: 106 mEq/L (ref 96–112)
GFR: 79.49 mL/min (ref >60.00)
GLUCOSE: 108 mg/dL — AB (ref 70–99)
Potassium: 4.2 mEq/L (ref 3.5–5.1)
Sodium: 140 mEq/L (ref 135–145)

## 2016-08-09 LAB — LIPID PANEL
CHOL/HDL RATIO: 3
CHOLESTEROL: 159 mg/dL (ref 0–200)
HDL: 55 mg/dL (ref >39.00)
LDL CALC: 75 mg/dL (ref 0–99)
NonHDL: 104.23
TRIGLYCERIDES: 147 mg/dL (ref 0.0–149.0)
VLDL: 29.4 mg/dL (ref 0.0–40.0)

## 2016-08-09 LAB — PSA: PSA: 0.69 ng/mL (ref 0.10–4.00)

## 2016-08-09 LAB — TSH: TSH: 0.74 u[IU]/mL (ref 0.35–4.50)

## 2016-08-09 LAB — HEMOGLOBIN A1C: HEMOGLOBIN A1C: 6.1 % (ref 4.6–6.5)

## 2016-08-09 LAB — HEPATIC FUNCTION PANEL
ALBUMIN: 4.3 g/dL (ref 3.5–5.2)
ALT: 24 U/L (ref 0–53)
AST: 18 U/L (ref 0–37)
Alkaline Phosphatase: 58 U/L (ref 39–117)
BILIRUBIN TOTAL: 0.7 mg/dL (ref 0.2–1.2)
Bilirubin, Direct: 0.2 mg/dL (ref 0.0–0.3)
Total Protein: 7.3 g/dL (ref 6.0–8.3)

## 2016-08-09 LAB — HIV ANTIBODY (ROUTINE TESTING W REFLEX): HIV 1&2 Ab, 4th Generation: NONREACTIVE

## 2016-08-10 DIAGNOSIS — R739 Hyperglycemia, unspecified: Secondary | ICD-10-CM | POA: Insufficient documentation

## 2016-08-10 NOTE — Assessment & Plan Note (Signed)
Lab Results  Component Value Date   HGBA1C 6.1 08/09/2016   stable overall by history and exam, recent data reviewed with pt, and pt to continue medical treatment as before,  to f/u any worsening symptoms or concerns

## 2016-08-10 NOTE — Assessment & Plan Note (Signed)

## 2016-08-10 NOTE — Assessment & Plan Note (Signed)
stable overall by history and exam, recent data reviewed with pt, and pt to continue medical treatment as before,  to f/u any worsening symptoms or concerns BP Readings from Last 3 Encounters:  08/06/16 126/84  11/02/15 116/81  10/10/15 110/76

## 2016-08-12 MED ORDER — WARFARIN SODIUM 5 MG PO TABS: ORAL_TABLET | ORAL | 1 refills | Status: DC

## 2016-08-19 ENCOUNTER — Telehealth: Payer: Self-pay | Admitting: Internal Medicine

## 2016-08-20 ENCOUNTER — Emergency Department (HOSPITAL_COMMUNITY): Payer: Worker's Compensation

## 2016-08-20 ENCOUNTER — Emergency Department (HOSPITAL_COMMUNITY)
Admission: EM | Admit: 2016-08-20 | Discharge: 2016-08-20 | Disposition: A | Discharge status: Home / Self Care | Payer: Worker's Compensation | Attending: Emergency Medicine | Admitting: Emergency Medicine

## 2016-08-20 DIAGNOSIS — Y990 Civilian activity done for income or pay: Secondary | ICD-10-CM | POA: Diagnosis not present

## 2016-08-20 DIAGNOSIS — S0083XA Contusion of other part of head, initial encounter: Secondary | ICD-10-CM | POA: Diagnosis not present

## 2016-08-20 DIAGNOSIS — Y9389 Activity, other specified: Secondary | ICD-10-CM | POA: Insufficient documentation

## 2016-08-20 DIAGNOSIS — Z79899 Other long term (current) drug therapy: Secondary | ICD-10-CM | POA: Insufficient documentation

## 2016-08-20 DIAGNOSIS — I10 Essential (primary) hypertension: Secondary | ICD-10-CM | POA: Diagnosis not present

## 2016-08-20 DIAGNOSIS — S098XXA Other specified injuries of head, initial encounter: Secondary | ICD-10-CM | POA: Diagnosis not present

## 2016-08-20 DIAGNOSIS — Z87891 Personal history of nicotine dependence: Secondary | ICD-10-CM | POA: Diagnosis not present

## 2016-08-20 DIAGNOSIS — S0190XA Unspecified open wound of unspecified part of head, initial encounter: Secondary | ICD-10-CM | POA: Diagnosis not present

## 2016-08-20 DIAGNOSIS — R072 Precordial pain: Secondary | ICD-10-CM | POA: Diagnosis not present

## 2016-08-20 DIAGNOSIS — Y9289 Other specified places as the place of occurrence of the external cause: Secondary | ICD-10-CM | POA: Insufficient documentation

## 2016-08-20 DIAGNOSIS — S0990XA Unspecified injury of head, initial encounter: Secondary | ICD-10-CM

## 2016-08-20 LAB — PROTIME-INR
INR: 2.04
PROTHROMBIN TIME: 23.4 s — AB (ref 11.4–15.2)

## 2016-08-20 LAB — CBC WITH DIFFERENTIAL/PLATELET
Basophils Absolute: 0 10*3/uL (ref 0.0–0.1)
Basophils Relative: 0 %
EOS ABS: 0 10*3/uL (ref 0.0–0.7)
EOS PCT: 0 %
HCT: 41.6 % (ref 39.0–52.0)
HEMOGLOBIN: 14.5 g/dL (ref 13.0–17.0)
LYMPHS PCT: 9 %
Lymphs Abs: 1.5 10*3/uL (ref 0.7–4.0)
MCH: 30.9 pg (ref 26.0–34.0)
MCHC: 34.9 g/dL (ref 30.0–36.0)
MCV: 88.7 fL (ref 78.0–100.0)
MONOS PCT: 6 %
Monocytes Absolute: 1 10*3/uL (ref 0.1–1.0)
Neutro Abs: 13.1 10*3/uL — ABNORMAL HIGH (ref 1.7–7.7)
Neutrophils Relative %: 85 %
PLATELETS: 188 10*3/uL (ref 150–400)
RBC: 4.69 MIL/uL (ref 4.22–5.81)
RDW: 13.6 % (ref 11.5–15.5)
WBC: 15.6 10*3/uL — ABNORMAL HIGH (ref 4.0–10.5)

## 2016-08-20 LAB — BASIC METABOLIC PANEL
Anion gap: 10 (ref 5–15)
BUN: 16 mg/dL (ref 6–20)
CHLORIDE: 107 mmol/L (ref 101–111)
CO2: 23 mmol/L (ref 22–32)
CREATININE: 1.11 mg/dL (ref 0.61–1.24)
Calcium: 9.4 mg/dL (ref 8.9–10.3)
GFR calc Af Amer: >60 mL/min (ref >60)
GFR calc non Af Amer: >60 mL/min (ref >60)
GLUCOSE: 122 mg/dL — AB (ref 65–99)
POTASSIUM: 3.8 mmol/L (ref 3.5–5.1)
Sodium: 140 mmol/L (ref 135–145)

## 2016-08-20 LAB — TROPONIN I: Troponin I: <0.03 ng/mL (ref <0.03)

## 2016-08-20 LAB — I-STAT TROPONIN, ED: Troponin i, poc: 0 ng/mL (ref 0.00–0.08)

## 2016-08-20 MED ORDER — ACETAMINOPHEN 500 MG PO TABS
1000.0000 mg | ORAL_TABLET | Freq: Once | ORAL | Status: AC
  Administered 2016-08-20: 1000 mg via ORAL
  Filled 2016-08-20: qty 2
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
## 2016-08-20 NOTE — ED Notes (Signed)
EKG given to Dr. Long 

## 2016-08-20 NOTE — Discharge Instructions (Signed)

## 2016-08-20 NOTE — ED Notes (Signed)
ED Provider at bedside. 
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
## 2016-08-21 ENCOUNTER — Ambulatory Visit (INDEPENDENT_AMBULATORY_CARE_PROVIDER_SITE_OTHER): Payer: Worker's Compensation | Admitting: Internal Medicine

## 2016-08-21 ENCOUNTER — Encounter: Payer: Self-pay | Admitting: Internal Medicine

## 2016-08-21 VITALS — BP 112/78 | HR 87 | Temp 98.4°F | Ht 72.0 in | Wt 186.0 lb

## 2016-08-21 DIAGNOSIS — I481 Persistent atrial fibrillation: Secondary | ICD-10-CM

## 2016-08-21 DIAGNOSIS — I10 Essential (primary) hypertension: Secondary | ICD-10-CM

## 2016-08-21 DIAGNOSIS — M278 Other specified diseases of jaws: Secondary | ICD-10-CM | POA: Insufficient documentation

## 2016-08-21 DIAGNOSIS — S0993XD Unspecified injury of face, subsequent encounter: Secondary | ICD-10-CM

## 2016-08-21 DIAGNOSIS — R220 Localized swelling, mass and lump, head: Secondary | ICD-10-CM | POA: Diagnosis not present

## 2016-08-21 DIAGNOSIS — I4819 Other persistent atrial fibrillation: Secondary | ICD-10-CM

## 2016-08-21 NOTE — Assessment & Plan Note (Addendum)
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

## 2016-08-21 NOTE — Progress Notes (Signed)
Pre visit review using our clinic review tool, if applicable. No additional management support is needed unless otherwise documented below in the visit note. 
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
## 2016-08-21 NOTE — Patient Instructions (Signed)
Please continue all other medications as before, and refills have been done if requested.  Please have the pharmacy call with any other refills you may need.  Please keep your appointments with your specialists as you may have planned  You will be contacted regarding the referral for: Ct neck and ENT referral

## 2016-08-22 ENCOUNTER — Ambulatory Visit
Admission: RE | Admit: 2016-08-22 | Discharge: 2016-08-22 | Disposition: A | Discharge status: Home / Self Care | Payer: BLUE CROSS/BLUE SHIELD | Source: Ambulatory Visit | Attending: Internal Medicine | Admitting: Internal Medicine

## 2016-08-22 DIAGNOSIS — R220 Localized swelling, mass and lump, head: Principal | ICD-10-CM

## 2016-08-22 DIAGNOSIS — S0993XD Unspecified injury of face, subsequent encounter: Secondary | ICD-10-CM | POA: Insufficient documentation

## 2016-08-22 DIAGNOSIS — M278 Other specified diseases of jaws: Secondary | ICD-10-CM

## 2016-08-22 MED ORDER — IOPAMIDOL (ISOVUE-300) INJECTION 61%
75.0000 mL | Freq: Once | INTRAVENOUS | Status: AC | PRN
  Administered 2016-08-22: 75 mL via INTRAVENOUS

## 2016-08-22 NOTE — Assessment & Plan Note (Signed)
Rate and volume stable,  Cont to monitor; cont coumadin and noted at goal yesterday

## 2016-08-22 NOTE — Assessment & Plan Note (Signed)
Overall improved, with several areas bruising already starting to improved, to cont icing three times daily for 48 hrs

## 2016-08-22 NOTE — Assessment & Plan Note (Signed)
stable overall by history and exam, recent data reviewed with pt, and pt to continue medical treatment as before,  to f/u any worsening symptoms or concerns BP Readings from Last 3 Encounters:  08/21/16 112/78  08/20/16 (!) 142/95  08/06/16 126/84

## 2016-08-23 ENCOUNTER — Encounter: Payer: Self-pay | Admitting: Internal Medicine

## 2016-08-26 DIAGNOSIS — S0083XA Contusion of other part of head, initial encounter: Secondary | ICD-10-CM | POA: Insufficient documentation

## 2016-08-26 DIAGNOSIS — M6289 Other specified disorders of muscle: Secondary | ICD-10-CM | POA: Insufficient documentation

## 2016-08-27 ENCOUNTER — Other Ambulatory Visit: Payer: Self-pay | Admitting: Occupational Medicine

## 2016-08-27 ENCOUNTER — Ambulatory Visit: Payer: Self-pay

## 2016-08-27 DIAGNOSIS — M25522 Pain in left elbow: Secondary | ICD-10-CM

## 2016-09-03 ENCOUNTER — Encounter: Payer: Self-pay | Admitting: Internal Medicine

## 2016-09-16 DIAGNOSIS — H00022 Hordeolum internum right lower eyelid: Secondary | ICD-10-CM | POA: Diagnosis not present

## 2016-09-17 ENCOUNTER — Other Ambulatory Visit: Payer: Self-pay | Admitting: Internal Medicine

## 2016-09-18 ENCOUNTER — Ambulatory Visit (INDEPENDENT_AMBULATORY_CARE_PROVIDER_SITE_OTHER): Payer: BLUE CROSS/BLUE SHIELD | Admitting: Internal Medicine

## 2016-09-18 ENCOUNTER — Encounter: Payer: Self-pay | Admitting: Internal Medicine

## 2016-09-18 ENCOUNTER — Ambulatory Visit (INDEPENDENT_AMBULATORY_CARE_PROVIDER_SITE_OTHER): Payer: BLUE CROSS/BLUE SHIELD | Admitting: *Deleted

## 2016-09-18 VITALS — BP 122/84 | HR 85 | Ht 72.0 in | Wt 192.6 lb

## 2016-09-18 DIAGNOSIS — I4819 Other persistent atrial fibrillation: Secondary | ICD-10-CM

## 2016-09-18 DIAGNOSIS — I481 Persistent atrial fibrillation: Secondary | ICD-10-CM

## 2016-09-18 DIAGNOSIS — I4891 Unspecified atrial fibrillation: Secondary | ICD-10-CM | POA: Diagnosis not present

## 2016-09-18 DIAGNOSIS — I10 Essential (primary) hypertension: Secondary | ICD-10-CM

## 2016-09-18 DIAGNOSIS — I4892 Unspecified atrial flutter: Secondary | ICD-10-CM | POA: Diagnosis not present

## 2016-09-18 DIAGNOSIS — Z5181 Encounter for therapeutic drug level monitoring: Secondary | ICD-10-CM

## 2016-09-18 LAB — POCT INR: INR: 2
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
## 2016-09-18 NOTE — Patient Instructions (Addendum)
Medication Instructions:  Your physician has recommended you make the following change in your medication:  1) Stop Flecainide   Labwork: Your physician recommends that you return for lab work on: 10/23/16 (BMP/CBC)--you do not have to fast Weekly INR's starting week of 5/01/1/     Testing/Procedures:  Your physician has requested that you have an echocardiogram. Echocardiography is a painless test that uses sound waves to create images of your heart. It provides your doctor with information about the size and shape of your heart and how well your heart's chambers and valves are working. This procedure takes approximately one hour. There are no restrictions for this procedure.    Your physician has requested that you have a TEE. During a TEE, sound waves are used to create images of your heart. It provides your doctor with information about the size and shape of your heart and how well your heart's chambers and valves are working. In this test, a transducer is attached to the end of a flexible tube that's guided down your throat and into your esophagus (the tube leading from you mouth to your stomach) to get a more detailed image of your heart. You are not awake for the procedure. Please see the instruction sheet given to you today. For further information please visit [email or web address]  Please arrive at [facility] [place] [facility] at 8:30am Do not eat or drink after midnight the night prior to the procedure Okay to take your medications the morning of with a small sip of water Will need someone to drive you home after the procedure      Your physician has recommended that you have an ablation. Catheter ablation is a medical procedure used to treat some cardiac arrhythmias (irregular heartbeats). During catheter ablation, a long, thin, flexible tube is put into a blood vessel in your groin (upper thigh), or neck. This tube is called an ablation  catheter. It is then guided to your heart through the blood vessel. Radio frequency waves destroy small areas of heart tissue where abnormal heartbeats may cause an arrhythmia to start. Please see the instruction sheet given to you today.---10/31/16  Please arrive at [facility] [place] [facility] at 5:30 Do not eat or drink after midnight the night prior to the procedure Do not take your medications the morning of the procedure Plan for one night stay Will need someone to drive you home at discharge     Follow-Up: Your physician recommends that you schedule a follow-up appointment in: 4 weeks from 10/31/16 with Roderic Palau, NP and 3 months from 10/31/16 with Dr Rayann Heman   Any Other Special Instructions Will Be Listed Below (If Applicable).     If you need a refill on your cardiac medications before your next appointment, please call your pharmacy.

## 2016-09-23 ENCOUNTER — Telehealth: Payer: Self-pay | Admitting: Pharmacist

## 2016-09-23 NOTE — Telephone Encounter (Signed)
LMTCB about scheduling weekly INRs due to afib ablation pending 10/30/16.

## 2016-09-23 NOTE — Telephone Encounter (Signed)
-----   Message from Dionicio Stall, RN sent at 09/18/2016  5:16 PM EDT ----- Will need weekly INR's starting first week of May for repeat afib ablation

## 2016-09-24 ENCOUNTER — Other Ambulatory Visit: Payer: Self-pay | Admitting: Internal Medicine

## 2016-09-24 MED ORDER — WARFARIN SODIUM 5 MG PO TABS: ORAL_TABLET | ORAL | 1 refills | Status: DC

## 2016-09-24 NOTE — Telephone Encounter (Signed)
Pt scheduled for May 2nd for weekly INRs. Pt states he needs refill sent. This has also been done.

## 2016-09-26 DIAGNOSIS — H0012 Chalazion right lower eyelid: Secondary | ICD-10-CM | POA: Diagnosis not present

## 2016-10-02 ENCOUNTER — Ambulatory Visit (INDEPENDENT_AMBULATORY_CARE_PROVIDER_SITE_OTHER): Payer: BLUE CROSS/BLUE SHIELD | Admitting: *Deleted

## 2016-10-02 DIAGNOSIS — Z5181 Encounter for therapeutic drug level monitoring: Secondary | ICD-10-CM

## 2016-10-02 DIAGNOSIS — I481 Persistent atrial fibrillation: Secondary | ICD-10-CM

## 2016-10-02 DIAGNOSIS — I4891 Unspecified atrial fibrillation: Secondary | ICD-10-CM

## 2016-10-02 DIAGNOSIS — I4819 Other persistent atrial fibrillation: Secondary | ICD-10-CM

## 2016-10-02 LAB — POCT INR: INR: 3.8

## 2016-10-03 ENCOUNTER — Other Ambulatory Visit: Payer: Self-pay | Admitting: Internal Medicine

## 2016-10-09 ENCOUNTER — Ambulatory Visit (INDEPENDENT_AMBULATORY_CARE_PROVIDER_SITE_OTHER): Payer: BLUE CROSS/BLUE SHIELD | Admitting: Pharmacist

## 2016-10-09 DIAGNOSIS — I4819 Other persistent atrial fibrillation: Secondary | ICD-10-CM

## 2016-10-09 DIAGNOSIS — Z5181 Encounter for therapeutic drug level monitoring: Secondary | ICD-10-CM | POA: Diagnosis not present

## 2016-10-09 DIAGNOSIS — I4891 Unspecified atrial fibrillation: Secondary | ICD-10-CM | POA: Diagnosis not present

## 2016-10-09 DIAGNOSIS — I481 Persistent atrial fibrillation: Secondary | ICD-10-CM

## 2016-10-09 LAB — POCT INR: INR: 2.3

## 2016-10-16 ENCOUNTER — Ambulatory Visit (INDEPENDENT_AMBULATORY_CARE_PROVIDER_SITE_OTHER): Payer: BLUE CROSS/BLUE SHIELD | Admitting: *Deleted

## 2016-10-16 DIAGNOSIS — I481 Persistent atrial fibrillation: Secondary | ICD-10-CM | POA: Diagnosis not present

## 2016-10-16 DIAGNOSIS — I4819 Other persistent atrial fibrillation: Secondary | ICD-10-CM

## 2016-10-16 DIAGNOSIS — I4891 Unspecified atrial fibrillation: Secondary | ICD-10-CM

## 2016-10-16 DIAGNOSIS — Z5181 Encounter for therapeutic drug level monitoring: Secondary | ICD-10-CM | POA: Diagnosis not present

## 2016-10-16 LAB — POCT INR: INR: 2.8

## 2016-10-17 ENCOUNTER — Other Ambulatory Visit: Payer: Self-pay | Admitting: Internal Medicine

## 2016-10-23 ENCOUNTER — Ambulatory Visit (HOSPITAL_COMMUNITY): Payer: BLUE CROSS/BLUE SHIELD | Attending: Cardiovascular Disease

## 2016-10-23 ENCOUNTER — Other Ambulatory Visit: Payer: BLUE CROSS/BLUE SHIELD | Admitting: *Deleted

## 2016-10-23 ENCOUNTER — Ambulatory Visit (INDEPENDENT_AMBULATORY_CARE_PROVIDER_SITE_OTHER): Payer: BLUE CROSS/BLUE SHIELD | Admitting: *Deleted

## 2016-10-23 ENCOUNTER — Other Ambulatory Visit: Payer: Self-pay

## 2016-10-23 DIAGNOSIS — I4892 Unspecified atrial flutter: Secondary | ICD-10-CM | POA: Diagnosis not present

## 2016-10-23 DIAGNOSIS — I340 Nonrheumatic mitral (valve) insufficiency: Secondary | ICD-10-CM | POA: Insufficient documentation

## 2016-10-23 DIAGNOSIS — I481 Persistent atrial fibrillation: Secondary | ICD-10-CM

## 2016-10-23 DIAGNOSIS — Z5181 Encounter for therapeutic drug level monitoring: Secondary | ICD-10-CM

## 2016-10-23 DIAGNOSIS — I4819 Other persistent atrial fibrillation: Secondary | ICD-10-CM

## 2016-10-23 DIAGNOSIS — I4891 Unspecified atrial fibrillation: Secondary | ICD-10-CM | POA: Diagnosis not present

## 2016-10-23 LAB — POCT INR: INR: 3.1

## 2016-10-24 ENCOUNTER — Encounter (HOSPITAL_COMMUNITY): Payer: Self-pay | Admitting: Nurse Practitioner

## 2016-10-24 LAB — CBC WITH DIFFERENTIAL/PLATELET
BASOS: 0 %
Basophils Absolute: 0 10*3/uL (ref 0.0–0.2)
EOS (ABSOLUTE): 0.2 10*3/uL (ref 0.0–0.4)
Eos: 3 %
Hematocrit: 42.1 % (ref 37.5–51.0)
Hemoglobin: 14.6 g/dL (ref 13.0–17.7)
IMMATURE GRANS (ABS): 0 10*3/uL (ref 0.0–0.1)
Immature Granulocytes: 0 %
LYMPHS ABS: 2.2 10*3/uL (ref 0.7–3.1)
LYMPHS: 25 %
MCH: 30.2 pg (ref 26.6–33.0)
MCHC: 34.7 g/dL (ref 31.5–35.7)
MCV: 87 fL (ref 79–97)
MONOS ABS: 0.9 10*3/uL (ref 0.1–0.9)
Monocytes: 10 %
NEUTROS ABS: 5.4 10*3/uL (ref 1.4–7.0)
Neutrophils: 62 %
Platelets: 226 10*3/uL (ref 150–379)
RBC: 4.84 x10E6/uL (ref 4.14–5.80)
RDW: 13.2 % (ref 12.3–15.4)
WBC: 8.8 10*3/uL (ref 3.4–10.8)

## 2016-10-24 LAB — BASIC METABOLIC PANEL
BUN / CREAT RATIO: 16 (ref 9–20)
BUN: 18 mg/dL (ref 6–24)
CO2: 22 mmol/L (ref 18–29)
Calcium: 9.7 mg/dL (ref 8.7–10.2)
Chloride: 106 mmol/L (ref 96–106)
Creatinine, Ser: 1.13 mg/dL (ref 0.76–1.27)
GFR, EST AFRICAN AMERICAN: 83 mL/min/{1.73_m2} (ref >59)
GFR, EST NON AFRICAN AMERICAN: 72 mL/min/{1.73_m2} (ref >59)
Glucose: 93 mg/dL (ref 65–99)
POTASSIUM: 4.4 mmol/L (ref 3.5–5.2)
SODIUM: 142 mmol/L (ref 134–144)

## 2016-10-30 ENCOUNTER — Ambulatory Visit (HOSPITAL_COMMUNITY)
Admission: RE | Admit: 2016-10-30 | Discharge: 2016-10-30 | Disposition: A | Discharge status: Home / Self Care | Payer: BLUE CROSS/BLUE SHIELD | Source: Ambulatory Visit | Attending: Cardiovascular Disease | Admitting: Cardiovascular Disease

## 2016-10-30 ENCOUNTER — Encounter (HOSPITAL_COMMUNITY)
Admission: RE | Disposition: A | Discharge status: Home / Self Care | Payer: Self-pay | Source: Ambulatory Visit | Attending: Cardiovascular Disease

## 2016-10-30 ENCOUNTER — Encounter (HOSPITAL_COMMUNITY): Payer: Self-pay

## 2016-10-30 ENCOUNTER — Ambulatory Visit (HOSPITAL_BASED_OUTPATIENT_CLINIC_OR_DEPARTMENT_OTHER)
Admission: RE | Admit: 2016-10-30 | Discharge: 2016-10-30 | Disposition: A | Discharge status: Home / Self Care | Payer: BLUE CROSS/BLUE SHIELD | Source: Ambulatory Visit | Attending: Nurse Practitioner | Admitting: Nurse Practitioner

## 2016-10-30 ENCOUNTER — Ambulatory Visit (INDEPENDENT_AMBULATORY_CARE_PROVIDER_SITE_OTHER): Payer: BLUE CROSS/BLUE SHIELD | Admitting: *Deleted

## 2016-10-30 DIAGNOSIS — I4819 Other persistent atrial fibrillation: Secondary | ICD-10-CM

## 2016-10-30 DIAGNOSIS — I340 Nonrheumatic mitral (valve) insufficiency: Secondary | ICD-10-CM | POA: Diagnosis not present

## 2016-10-30 DIAGNOSIS — I10 Essential (primary) hypertension: Secondary | ICD-10-CM | POA: Insufficient documentation

## 2016-10-30 DIAGNOSIS — Z87828 Personal history of other (healed) physical injury and trauma: Secondary | ICD-10-CM | POA: Insufficient documentation

## 2016-10-30 DIAGNOSIS — K219 Gastro-esophageal reflux disease without esophagitis: Secondary | ICD-10-CM | POA: Insufficient documentation

## 2016-10-30 DIAGNOSIS — Z5181 Encounter for therapeutic drug level monitoring: Secondary | ICD-10-CM

## 2016-10-30 DIAGNOSIS — Z823 Family history of stroke: Secondary | ICD-10-CM | POA: Diagnosis not present

## 2016-10-30 DIAGNOSIS — I4892 Unspecified atrial flutter: Secondary | ICD-10-CM | POA: Diagnosis not present

## 2016-10-30 DIAGNOSIS — E785 Hyperlipidemia, unspecified: Secondary | ICD-10-CM | POA: Insufficient documentation

## 2016-10-30 DIAGNOSIS — Z87891 Personal history of nicotine dependence: Secondary | ICD-10-CM | POA: Insufficient documentation

## 2016-10-30 DIAGNOSIS — Z8249 Family history of ischemic heart disease and other diseases of the circulatory system: Secondary | ICD-10-CM | POA: Insufficient documentation

## 2016-10-30 DIAGNOSIS — N528 Other male erectile dysfunction: Secondary | ICD-10-CM | POA: Insufficient documentation

## 2016-10-30 DIAGNOSIS — Z7901 Long term (current) use of anticoagulants: Secondary | ICD-10-CM | POA: Insufficient documentation

## 2016-10-30 DIAGNOSIS — I481 Persistent atrial fibrillation: Secondary | ICD-10-CM

## 2016-10-30 DIAGNOSIS — I480 Paroxysmal atrial fibrillation: Secondary | ICD-10-CM | POA: Diagnosis not present

## 2016-10-30 DIAGNOSIS — I4891 Unspecified atrial fibrillation: Secondary | ICD-10-CM

## 2016-10-30 DIAGNOSIS — Z79899 Other long term (current) drug therapy: Secondary | ICD-10-CM | POA: Diagnosis not present

## 2016-10-30 HISTORY — PX: TEE WITHOUT CARDIOVERSION: SHX5443

## 2016-10-30 LAB — POCT INR: INR: 3

## 2016-10-30 SURGERY — ECHOCARDIOGRAM, TRANSESOPHAGEAL
Anesthesia: Moderate Sedation

## 2016-10-30 MED ORDER — DIPHENHYDRAMINE HCL 50 MG/ML IJ SOLN
INTRAMUSCULAR | Status: AC
  Filled 2016-10-30: qty 1

## 2016-10-30 MED ORDER — FENTANYL CITRATE (PF) 100 MCG/2ML IJ SOLN
INTRAMUSCULAR | Status: DC | PRN
  Administered 2016-10-30 (×2): 25 ug via INTRAVENOUS

## 2016-10-30 MED ORDER — SODIUM CHLORIDE 0.9 % IV SOLN
INTRAVENOUS | Status: DC
  Administered 2016-10-30: 09:00:00 via INTRAVENOUS

## 2016-10-30 MED ORDER — FENTANYL CITRATE (PF) 100 MCG/2ML IJ SOLN
INTRAMUSCULAR | Status: AC
  Filled 2016-10-30: qty 4

## 2016-10-30 MED ORDER — BUTAMBEN-TETRACAINE-BENZOCAINE 2-2-14 % EX AERO
INHALATION_SPRAY | CUTANEOUS | Status: DC | PRN
  Administered 2016-10-30: 2 via TOPICAL

## 2016-10-30 MED ORDER — MIDAZOLAM HCL 5 MG/ML IJ SOLN
INTRAMUSCULAR | Status: AC
  Filled 2016-10-30: qty 2

## 2016-10-30 MED ORDER — MIDAZOLAM HCL 10 MG/2ML IJ SOLN
INTRAMUSCULAR | Status: DC | PRN
  Administered 2016-10-30: 2 mg via INTRAVENOUS
  Administered 2016-10-30: 1 mg via INTRAVENOUS
  Administered 2016-10-30: 2 mg via INTRAVENOUS

## 2016-10-30 MED ORDER — SODIUM CHLORIDE 0.9 % IV SOLN: INTRAVENOUS | Status: DC
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
## 2016-10-30 NOTE — Progress Notes (Signed)
  Echocardiogram 2D Echocardiogram has been performed.  Richelle Glick T Chavie Kolinski 10/30/2016, 12:20 PM

## 2016-10-30 NOTE — Discharge Instructions (Signed)

## 2016-10-30 NOTE — CV Procedure (Signed)
During this procedure the patient is administered a total of Versed 4 mg and Fentanyl 50 mg to achieve and maintain moderate conscious sedation.  The patient's heart rate, blood pressure, and oxygen saturation are monitored continuously during the procedure. The period of conscious sedation is 30 minutes, of which I was present face-to-face 100% of this time.  EF 60% No LAA thrombus No PFO Normal AV Mild MR Normal RV No effusion  No aortic debris  Jenkins Rouge

## 2016-10-31 ENCOUNTER — Encounter (HOSPITAL_COMMUNITY)
Admission: RE | Disposition: A | Discharge status: Home / Self Care | Payer: Self-pay | Source: Ambulatory Visit | Attending: Internal Medicine

## 2016-10-31 ENCOUNTER — Ambulatory Visit (HOSPITAL_COMMUNITY): Payer: BLUE CROSS/BLUE SHIELD | Admitting: Certified Registered Nurse Anesthetist

## 2016-10-31 ENCOUNTER — Ambulatory Visit (HOSPITAL_COMMUNITY)
Admission: RE | Admit: 2016-10-31 | Discharge: 2016-11-01 | Disposition: A | Discharge status: Home / Self Care | Payer: BLUE CROSS/BLUE SHIELD | Source: Ambulatory Visit | Attending: Internal Medicine | Admitting: Internal Medicine

## 2016-10-31 ENCOUNTER — Encounter (HOSPITAL_COMMUNITY): Payer: Self-pay | Admitting: General Practice

## 2016-10-31 DIAGNOSIS — I4892 Unspecified atrial flutter: Secondary | ICD-10-CM | POA: Diagnosis not present

## 2016-10-31 DIAGNOSIS — I4819 Other persistent atrial fibrillation: Secondary | ICD-10-CM | POA: Diagnosis present

## 2016-10-31 DIAGNOSIS — Z7901 Long term (current) use of anticoagulants: Secondary | ICD-10-CM | POA: Diagnosis not present

## 2016-10-31 DIAGNOSIS — I481 Persistent atrial fibrillation: Secondary | ICD-10-CM | POA: Diagnosis not present

## 2016-10-31 DIAGNOSIS — Z87891 Personal history of nicotine dependence: Secondary | ICD-10-CM | POA: Diagnosis not present

## 2016-10-31 DIAGNOSIS — I4891 Unspecified atrial fibrillation: Secondary | ICD-10-CM | POA: Diagnosis not present

## 2016-10-31 DIAGNOSIS — I10 Essential (primary) hypertension: Secondary | ICD-10-CM | POA: Diagnosis not present

## 2016-10-31 DIAGNOSIS — E785 Hyperlipidemia, unspecified: Secondary | ICD-10-CM | POA: Diagnosis not present

## 2016-10-31 DIAGNOSIS — R0683 Snoring: Secondary | ICD-10-CM | POA: Diagnosis not present

## 2016-10-31 DIAGNOSIS — K219 Gastro-esophageal reflux disease without esophagitis: Secondary | ICD-10-CM | POA: Insufficient documentation

## 2016-10-31 HISTORY — DX: Unspecified osteoarthritis, unspecified site: M19.90

## 2016-10-31 HISTORY — PX: ATRIAL FIBRILLATION ABLATION: EP1191

## 2016-10-31 LAB — POCT ACTIVATED CLOTTING TIME
ACTIVATED CLOTTING TIME: 197 s
ACTIVATED CLOTTING TIME: 191 s
Activated Clotting Time: 279 seconds
Activated Clotting Time: 290 s
Activated Clotting Time: 191 seconds

## 2016-10-31 LAB — PROTIME-INR
INR: 2.83
PROTHROMBIN TIME: 30.3 s — AB (ref 11.4–15.2)

## 2016-10-31 LAB — MRSA PCR SCREENING: MRSA by PCR: NEGATIVE

## 2016-10-31 SURGERY — ATRIAL FIBRILLATION ABLATION
Anesthesia: Monitor Anesthesia Care

## 2016-10-31 MED ORDER — OFF THE BEAT BOOK
Freq: Once | Status: AC
Start: 2016-10-31 — End: 2016-10-31
  Administered 2016-10-31: 22:00:00
  Filled 2016-10-31: qty 1

## 2016-10-31 MED ORDER — EPHEDRINE SULFATE-NACL 50-0.9 MG/10ML-% IV SOSY
PREFILLED_SYRINGE | INTRAVENOUS | Status: DC | PRN
  Administered 2016-10-31: 10 mg via INTRAVENOUS
  Administered 2016-10-31: 5 mg via INTRAVENOUS

## 2016-10-31 MED ORDER — IOPAMIDOL (ISOVUE-370) INJECTION 76%
INTRAVENOUS | Status: DC | PRN
  Administered 2016-10-31: 3 mL

## 2016-10-31 MED ORDER — PHENYLEPHRINE 40 MCG/ML (10ML) SYRINGE FOR IV PUSH (FOR BLOOD PRESSURE SUPPORT)
PREFILLED_SYRINGE | INTRAVENOUS | Status: DC | PRN
  Administered 2016-10-31 (×2): 40 ug via INTRAVENOUS
  Administered 2016-10-31 (×2): 80 ug via INTRAVENOUS
  Administered 2016-10-31 (×3): 40 ug via INTRAVENOUS

## 2016-10-31 MED ORDER — DOXAZOSIN MESYLATE 4 MG PO TABS
4.0000 mg | ORAL_TABLET | Freq: Every day | ORAL | Status: DC
  Administered 2016-10-31: 4 mg via ORAL
  Filled 2016-10-31: qty 1

## 2016-10-31 MED ORDER — HEPARIN SODIUM (PORCINE) 1000 UNIT/ML IJ SOLN
INTRAMUSCULAR | Status: DC | PRN
  Administered 2016-10-31: 1000 [IU] via INTRAVENOUS
  Administered 2016-10-31: 10000 [IU] via INTRAVENOUS

## 2016-10-31 MED ORDER — AMLODIPINE BESYLATE 5 MG PO TABS
2.5000 mg | ORAL_TABLET | Freq: Every day | ORAL | Status: DC
  Administered 2016-11-01: 2.5 mg via ORAL
  Filled 2016-10-31: qty 1

## 2016-10-31 MED ORDER — SODIUM CHLORIDE 0.9% FLUSH
3.0000 mL | Freq: Two times a day (BID) | INTRAVENOUS | Status: DC
  Administered 2016-10-31 (×2): 3 mL via INTRAVENOUS

## 2016-10-31 MED ORDER — LIDOCAINE 2% (20 MG/ML) 5 ML SYRINGE
INTRAMUSCULAR | Status: DC | PRN
  Administered 2016-10-31: 40 mg via INTRAVENOUS

## 2016-10-31 MED ORDER — SODIUM CHLORIDE 0.9 % IV SOLN
INTRAVENOUS | Status: DC
  Administered 2016-10-31 (×2): via INTRAVENOUS

## 2016-10-31 MED ORDER — HYDROCODONE-ACETAMINOPHEN 5-325 MG PO TABS
1.0000 | ORAL_TABLET | ORAL | Status: DC | PRN
  Administered 2016-10-31: 22:00:00 2 via ORAL
  Filled 2016-10-31: qty 2

## 2016-10-31 MED ORDER — SODIUM CHLORIDE 0.9% FLUSH: 3.0000 mL | INTRAVENOUS | Status: DC | PRN

## 2016-10-31 MED ORDER — HEPARIN SODIUM (PORCINE) 1000 UNIT/ML IJ SOLN
INTRAMUSCULAR | Status: DC | PRN
  Administered 2016-10-31 (×2): 3000 [IU] via INTRAVENOUS

## 2016-10-31 MED ORDER — WARFARIN - PHYSICIAN DOSING INPATIENT
Freq: Every day | Status: DC
  Administered 2016-10-31: 18:00:00

## 2016-10-31 MED ORDER — PHENYLEPHRINE HCL 10 MG/ML IJ SOLN
INTRAMUSCULAR | Status: DC | PRN
  Administered 2016-10-31: 50 ug/min via INTRAVENOUS

## 2016-10-31 MED ORDER — MIDAZOLAM HCL 5 MG/5ML IJ SOLN
INTRAMUSCULAR | Status: DC | PRN
  Administered 2016-10-31 (×2): 1 mg via INTRAVENOUS

## 2016-10-31 MED ORDER — BUPIVACAINE HCL (PF) 0.25 % IJ SOLN
INTRAMUSCULAR | Status: AC
  Filled 2016-10-31: qty 30

## 2016-10-31 MED ORDER — ONDANSETRON HCL 4 MG/2ML IJ SOLN: 4.0000 mg | Freq: Four times a day (QID) | INTRAMUSCULAR | Status: DC | PRN

## 2016-10-31 MED ORDER — WARFARIN SODIUM 5 MG PO TABS
5.0000 mg | ORAL_TABLET | Freq: Once | ORAL | Status: AC
  Administered 2016-10-31: 5 mg via ORAL
  Filled 2016-10-31: qty 1

## 2016-10-31 MED ORDER — HEPARIN SODIUM (PORCINE) 1000 UNIT/ML IJ SOLN
INTRAMUSCULAR | Status: AC
  Filled 2016-10-31: qty 1

## 2016-10-31 MED ORDER — DILTIAZEM HCL ER COATED BEADS 120 MG PO CP24
120.0000 mg | ORAL_CAPSULE | Freq: Every day | ORAL | Status: DC
  Administered 2016-10-31 – 2016-11-01 (×2): 120 mg via ORAL
  Filled 2016-10-31 (×2): qty 1

## 2016-10-31 MED ORDER — ACETAMINOPHEN 325 MG PO TABS
650.0000 mg | ORAL_TABLET | ORAL | Status: DC | PRN
  Administered 2016-10-31: 650 mg via ORAL
  Filled 2016-10-31: qty 2

## 2016-10-31 MED ORDER — IOPAMIDOL (ISOVUE-370) INJECTION 76%
INTRAVENOUS | Status: AC
  Filled 2016-10-31: qty 50

## 2016-10-31 MED ORDER — PROTAMINE SULFATE 10 MG/ML IV SOLN
INTRAVENOUS | Status: DC | PRN
  Administered 2016-10-31: 30 mg via INTRAVENOUS

## 2016-10-31 MED ORDER — SODIUM CHLORIDE 0.9 % IV SOLN: 250.0000 mL | INTRAVENOUS | Status: DC | PRN

## 2016-10-31 MED ORDER — PROPOFOL 500 MG/50ML IV EMUL
INTRAVENOUS | Status: DC | PRN
  Administered 2016-10-31: 50 ug/kg/min via INTRAVENOUS

## 2016-10-31 MED ORDER — FENTANYL CITRATE (PF) 100 MCG/2ML IJ SOLN
INTRAMUSCULAR | Status: DC | PRN
  Administered 2016-10-31 (×2): 25 ug via INTRAVENOUS
  Administered 2016-10-31: 12.5 ug via INTRAVENOUS
  Administered 2016-10-31: 25 ug via INTRAVENOUS
  Administered 2016-10-31: 12.5 ug via INTRAVENOUS
  Administered 2016-10-31: 25 ug via INTRAVENOUS

## 2016-10-31 MED ORDER — IRBESARTAN 300 MG PO TABS
300.0000 mg | ORAL_TABLET | Freq: Every morning | ORAL | Status: DC
  Administered 2016-11-01: 300 mg via ORAL
  Filled 2016-10-31: qty 1

## 2016-10-31 MED ORDER — BUPIVACAINE HCL (PF) 0.25 % IJ SOLN
INTRAMUSCULAR | Status: DC | PRN
Start: 2016-10-31 — End: 2016-10-31
  Administered 2016-10-31: 20 mL

## 2016-10-31 SURGICAL SUPPLY — 18 items
BAG SNAP BAND KOVER 36X36 (MISCELLANEOUS) ×2 IMPLANT
BLANKET WARM UNDERBOD FULL ACC (MISCELLANEOUS) ×2 IMPLANT
CATH MAPPNG PENTARAY F 2-6-2MM (CATHETERS) IMPLANT
CATH NAVISTAR SMARTTOUCH DF (ABLATOR) ×1 IMPLANT
CATH SOUNDSTAR ECO REPROCESSED (CATHETERS) ×1 IMPLANT
CATH WEBSTER BI DIR CS D-F CRV (CATHETERS) ×1 IMPLANT
NDL TRANSEP BRK 71CM 407200 (NEEDLE) IMPLANT
NEEDLE TRANSEP BRK 71CM 407200 (NEEDLE) ×2 IMPLANT
PACK EP LATEX FREE (CUSTOM PROCEDURE TRAY) ×2
PACK EP LF (CUSTOM PROCEDURE TRAY) ×1 IMPLANT
PAD DEFIB LIFELINK (PAD) ×2 IMPLANT
PATCH CARTO3 (PAD) ×1 IMPLANT
PENTARAY F 2-6-2MM (CATHETERS) ×2
SHEATH AVANTI 11F 11CM (SHEATH) ×1 IMPLANT
SHEATH PINNACLE 7F 10CM (SHEATH) ×2 IMPLANT
SHEATH PINNACLE 9F 10CM (SHEATH) ×1 IMPLANT
SHEATH SWARTZ TS SL2 63CM 8.5F (SHEATH) ×1 IMPLANT
TUBING SMART ABLATE COOLFLOW (TUBING) ×1 IMPLANT

## 2016-10-31 NOTE — Discharge Summary (Signed)
ELECTROPHYSIOLOGY PROCEDURE DISCHARGE SUMMARY    Patient ID: [name] [name],  MRN: [id number], DOB/AGE: 57-May-1962 57 y.o.  Admit date: 10/31/2016 Discharge date: 11/01/16  Primary Care Physician: Biagio Borg, MD Primary Cardiologist: Dr. Irish Lack Electrophysiologist: Thompson Grayer, MD  Primary Discharge Diagnosis:  1. Persistent AFib     CHA2DS2Vasc is at least 1, on warfarin, monitored and managed by coumadin clinic     next INR check in 11/07/16  Secondary Discharge Diagnosis:  1. HTN  Procedures This Admission:  1.  Electrophysiology study and radiofrequency catheter ablation on 10/31/16 by Dr Thompson Grayer.   This study demonstrated    CONCLUSIONS: 1. Atrial fibrillation upon presentation.   2. Intracardiac echo reveals a moderate sized left atrium with four separate pulmonary veins without evidence of pulmonary vein stenosis. 3. The right superior and right inferior pulmonary veins were quiescent from a prior ablation procedure.  There was return of electrical activity within the left superior pulmonary vein which appeared to be the afib culprit today.  The left inferior pulmonary vein was quiescent.  Voltage mapping revealed voltage along the carina between the RSPV/RIPV.  Given that prior ablation was antral isolation, I elected to perform WACA ablation of all four PVs today.  4. Successful pulmonary vein isolation with WACA technique 5. Additional left atrial ablation was performed with a standard box lesion created along the posterior wall of the left atrium 6. Atrial fibrillation successfully cardioverted to sinus rhythm. 7. Cavo tricuspid isthmus block confirmed from a prior ablation procedure 8. No early apparent complications.    Brief HPI: [name] [name] is a 56 y.o. male with a history of persistent atrial fibrillation, prior AF ablation.  He has failed medical therapy with flecainide. Risks, benefits, and alternatives to catheter ablation of atrial fibrillation  were reviewed with the patient who wished to proceed.  The patient underwent TEE prior to the procedure which demonstrated normal LV function and no LAA thrombus.    Hospital Course:  The patient was admitted and underwent EPS/RFCA of atrial fibrillation with details as outlined above.  They were monitored on telemetry overnight which demonstrated SB/SR 50's-60's.  Groin was without complication on the day of discharge.  The patient was examined by Dr. Rayann Heman and considered to be stable for discharge.  Wound care and restrictions were reviewed with the patient.  The patient will be seen back by Roderic Palau, NP in 4 weeks and Dr Rayann Heman in 12 weeks for post ablation follow up.  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  INR today is 3.5, (yesterday 2.8) he received a reduced dose last PM, will be due for 5mg  dose this evening to continue his usual regime, coumadin clinic check next week.   Physical Exam: Vitals:   10/31/16 1630 10/31/16 1933 11/01/16 0443 11/01/16 0747  BP: 116/77 (!) 145/92 118/82 116/75  Pulse: 62 71 (!) 52 (!) 56  Resp: 18 19 14 16   Temp: 98 F (36.7 C) 97.3 F (36.3 C) 97.8 F (36.6 C) 98 F (36.7 C)  TempSrc: Oral Oral Axillary Oral  SpO2: 100% 97% 98% 99%  Weight:   191 lb 5.8 oz (86.8 kg)   Height:        GEN- The patient is well appearing, alert and oriented x 3 today.   HEENT: normocephalic, atraumatic; sclera clear, conjunctiva  pink; hearing intact; oropharynx clear; neck supple  Lungs- CTA b/l, normal work of breathing.  No wheezes, rales, rhonchi Heart- RRR, no murmurs, rubs or gallops  GI- soft, non-tender, non-distended  Extremities- no clubbing, cyanosis, or edema; DP/PT/radial pulses 2+ bilaterally, groin without hematoma/bruit MS- no significant deformity or atrophy Skin- warm and dry, no rash or lesion Psych- euthymic  mood, full affect Neuro- strength and sensation are intact   Labs:   Lab Results  Component Value Date   WBC 8.8 10/23/2016   HGB 14.5 08/20/2016   HCT 42.1 10/23/2016   MCV 87 10/23/2016   PLT 226 10/23/2016   No results for input(s): NA, K, CL, CO2, BUN, CREATININE, CALCIUM, PROT, BILITOT, ALKPHOS, ALT, AST, GLUCOSE in the last 168 hours.  Invalid input(s): LABALBU   Discharge Medications:  Allergies as of 11/01/2016      Reactions   Ibuprofen Other (See Comments)   Rapid heart beat      Medication List    TAKE these medications   amLODipine 2.5 MG tablet Commonly known as:  NORVASC TAKE ONE TABLET BY MOUTH ONCE DAILY   atorvastatin 20 MG tablet Commonly known as:  LIPITOR Take 1 tablet (20 mg total) by mouth daily.   diltiazem 120 MG 24 hr capsule Commonly known as:  CARDIZEM CD TAKE 1 CAPSULE BY MOUTH EVERY DAY   doxazosin 4 MG tablet Commonly known as:  CARDURA Take 1 tablet (4 mg total) by mouth at bedtime.   irbesartan 300 MG tablet Commonly known as:  AVAPRO TAKE 1 TABLET BY MOUTH EVERY MORNING   MULTIVITAMIN ADULTS 50+ PO Take 1 capsule by mouth daily.   pantoprazole 40 MG tablet Commonly known as:  PROTONIX Take 1 tablet (40 mg total) by mouth daily.   REFRESH OP Place 2 drops into both eyes as needed (for dry eyes).   tadalafil 20 MG tablet Commonly known as:  CIALIS Take 1 tablet (20 mg total) by mouth daily as needed for erectile dysfunction.   warfarin 5 MG tablet Commonly known as:  COUMADIN TAKE AS DIRECTED BY COUMADIN CLINIC What changed:  how much to take  how to take this  when to take this  additional instructions      Pt is instructed to take no warfarin 11/01/16 and eat greens.  Resume 5mg  daily warfarin tomorrow.  INR check 11/07/16.   Disposition:  Home   Follow-up Information    [facility] Follow up on 11/07/2016.   Specialty:  Cardiology Why:  9:00AM, coumadin clinic, lab draw Contact  information: [address] [place] Follow up on 11/29/2016.   Specialty:  Cardiology Why:  11:30AM Contact information: [address] [id number] [place] [phone]       Thompson Grayer, MD Follow up on 02/05/2017.   Specialty:  Cardiology Why:  4:15PM Contact information: [place] [address] [phone]           Duration of Discharge Encounter: Greater than 30 minutes including physician time.  Signed, Tommye Standard, PA-C 11/01/2016 10:04 AM  I have seen, examined the patient, and reviewed the above assessment and plan. On exam, RRR.  Changes to above are made where necessary.  DC to home.  Resume prior medicines.  If HR remains < 60, patient will stop diltiazem.  Co Sign: Thompson Grayer,  MD 11/01/2016 10:05 AM

## 2016-10-31 NOTE — Care Management Note (Signed)
Case Management Note  Patient Details  Name: BRAELIN COSTLOW MRN: [id number] Date of Birth: 07/01/1960  Subjective/Objective:     S/p afib ablation, he has [employer].    PCP Cathlean Cower             Action/Plan: NCM will follow for dc needs.  Expected Discharge Date:                  Expected Discharge Plan:     In-House Referral:     Discharge planning Services  CM Consult  Post Acute Care Choice:    Choice offered to:     DME Arranged:    DME Agency:     HH Arranged:    HH Agency:     Status of Service:  In process, will continue to follow  If discussed at Long Length of Stay Meetings, dates discussed:    Additional Comments:  Zenon Mayo, RN 10/31/2016, 3:18 PM

## 2016-10-31 NOTE — Anesthesia Procedure Notes (Signed)
Procedure Name: MAC Performed by: Belinda Block Pre-anesthesia Checklist: Patient identified, Emergency Drugs available, Suction available, Patient being monitored and Timeout performed Patient Re-evaluated:Patient Re-evaluated prior to inductionOxygen Delivery Method: Simple face mask Placement Confirmation: positive ETCO2 Dental Injury: Teeth and Oropharynx as per pre-operative assessment

## 2016-10-31 NOTE — Progress Notes (Signed)
17fr,9fr 22fr venous sheaths aspirated and removed from right groin. Manual pressure applied for 30 minutes. Groin level o. No S+S of hematoma. Tegaderm dressing applied , bedrest instructions given.  dp and pt pulses palpable.   Bedrest begins at 12:30:00

## 2016-10-31 NOTE — Anesthesia Preprocedure Evaluation (Addendum)
Anesthesia Evaluation  Patient identified by MRN, date of birth, ID band Patient awake    Reviewed: Allergy & Precautions, NPO status , Patient's Chart, lab work & pertinent test results  Airway Mallampati: II  TM Distance: >3 FB     Dental   Pulmonary former smoker,    breath sounds clear to auscultation       Cardiovascular hypertension,  Rhythm:Regular Rate:Normal     Neuro/Psych    GI/Hepatic Neg liver ROS, GERD  ,  Endo/Other  negative endocrine ROS  Renal/GU negative Renal ROS     Musculoskeletal   Abdominal   Peds  Hematology   Anesthesia Other Findings   Reproductive/Obstetrics                             Anesthesia Physical Anesthesia Plan  ASA: III  Anesthesia Plan: MAC   Post-op Pain Management:    Induction: Intravenous  Airway Management Planned: Simple Face Mask  Additional Equipment:   Intra-op Plan:   Post-operative Plan:   Informed Consent: I have reviewed the patients History and Physical, chart, labs and discussed the procedure including the risks, benefits and alternatives for the proposed anesthesia with the patient or authorized representative who has indicated his/her understanding and acceptance.   Dental advisory given  Plan Discussed with: CRNA and Anesthesiologist  Anesthesia Plan Comments:         Anesthesia Quick Evaluation

## 2016-10-31 NOTE — Anesthesia Procedure Notes (Signed)
Procedure Name: LMA Insertion Date/Time: 10/31/2016 8:17 AM Performed by: Belinda Block Pre-anesthesia Checklist: Patient identified, Emergency Drugs available, Suction available, Patient being monitored and Timeout performed Patient Re-evaluated:Patient Re-evaluated prior to inductionOxygen Delivery Method: Circle system utilized Preoxygenation: Pre-oxygenation with 100% oxygen Intubation Type: IV induction Ventilation: Mask ventilation without difficulty and Oral airway inserted - appropriate to patient size LMA: LMA inserted LMA Size: 5.0 Number of attempts: 1 Placement Confirmation: positive ETCO2 and breath sounds checked- equal and bilateral Tube secured with: Tape Dental Injury: Teeth and Oropharynx as per pre-operative assessment

## 2016-10-31 NOTE — Anesthesia Postprocedure Evaluation (Signed)
Anesthesia Post Note  Patient: [name] [name]  Procedure(s) Performed: Procedure(s) (LRB): Atrial Fibrillation Ablation (N/A)  Patient location during evaluation: PACU Anesthesia Type: MAC Level of consciousness: awake Pain management: pain level controlled Vital Signs Assessment: post-procedure vital signs reviewed and stable Respiratory status: spontaneous breathing Cardiovascular status: stable Anesthetic complications: no       Last Vitals:  Vitals:   10/31/16 1025 10/31/16 1030  BP: 111/75 107/67  Pulse: (!) 51 (!) 50  Resp: (!) 7 11  Temp:      Last Pain:  Vitals:   10/31/16 0552  TempSrc: Oral                 Sheriann Newmann

## 2016-10-31 NOTE — H&P (Signed)
ID:  [name] [name], DOB [birth year], MRN [id number]  PCP:  Cathlean Cower, MD           Cardiologist:  Dr Irish Lack Primary Electrophysiologist: Thompson Grayer, MD          Chief Complaint  Patient presents with  . Atrial Fibrillation     History of Present Illness: [name] [name] is a 56 y.o. male who presents today for afib ablation.  He has intermittent afib despite flecainide.  Today, he denies symptoms of palpitations, chest pain, shortness of breath, orthopnea, PND, lower extremity edema, claudication, dizziness, presyncope, syncope, bleeding, or neurologic sequela. The patient is tolerating medications without difficulties and is otherwise without complaint today.        Past Medical History:  Diagnosis Date  . Atrial flutter ([place])   . CHEST PAIN-PRECORDIAL 05/25/2009  . ERECTILE DYSFUNCTION, ORGANIC 01/16/2010  . GERD (gastroesophageal reflux disease)   . HYPERLIPIDEMIA 12/15/2007  . HYPERTENSION 12/15/2007  . HYPOKALEMIA 12/15/2007  . Internal hemorrhoids   . Paroxysmal atrial fibrillation ([place]) 12/15/2007  . POSTURAL LIGHTHEADEDNESS 07/06/2008        Past Surgical History:  Procedure Laterality Date  . ABLATION OF DYSRHYTHMIC FOCUS  12/27/2011   CTI repeat ablation by Dr Rayann Heman  . ATRIAL FIBRILLATION ABLATION N/A 08/13/2011   Procedure: ATRIAL FIBRILLATION ABLATION;  Surgeon: Thompson Grayer, MD;  Location: [facility] C[facility] LAB;  Service: Cardiovascular;  Laterality: N/A;  . atrial fibrillation and atrial flutter ablation  08/13/11   PVI and CTI ablation by Dr Rayann Heman  . ATRIAL FLUTTER ABLATION N/A 12/27/2011   Procedure: ATRIAL FLUTTER ABLATION;  Surgeon: Thompson Grayer, MD;  Location: [facility] CATH LAB;  Service: Cardiovascular;  Laterality: N/A;  . CARDIOVERSION  11/21/2011   Procedure: CARDIOVERSION;  Surgeon: Larey Dresser, MD;  Location: [facility];  Service: Cardiovascular;  Laterality: N/A;  . ELBOW SURGERY Right bone spur  . INGUINAL HERNIA REPAIR  6/08  . TEE WITHOUT  CARDIOVERSION  08/12/2011   Procedure: TRANSESOPHAGEAL ECHOCARDIOGRAM (TEE);  Surgeon: Fay Records, MD;  Location: [facility] ENDOSCOPY;  Service: Cardiovascular;  Laterality: N/A;           Current Outpatient Prescriptions  Medication Sig Dispense Refill  . amLODipine (NORVASC) 2.5 MG tablet Take 2.5 mg by mouth daily.    . Ascorbic Acid (VITAMIN C PO) Take 1 tablet by mouth daily.    Marland Kitchen atorvastatin (LIPITOR) 20 MG tablet Take 1 tablet by mouth daily.    Marland Kitchen diltiazem (CARDIZEM CD) 120 MG 24 hr capsule TAKE 1 CAPSULE BY MOUTH EVERY DAY 90 capsule 3  . doxazosin (CARDURA) 4 MG tablet Take 1 tablet by mouth at bedtime.    . flecainide (TAMBOCOR) 100 MG tablet Take 1 tablet (100 mg total) by mouth 2 (two) times daily. *Patient must keep upcoming appointment for further refills* 60 tablet 0  . irbesartan (AVAPRO) 300 MG tablet TAKE 1 TABLET BY MOUTH EVERY MORNING 90 tablet 1  . L-ARGININE PO Take 1 capsule by mouth daily.    . L-GLUTAMINE PO Take 1 tablet by mouth daily.    . Multiple Vitamins-Minerals (MULTIVITAMIN ADULTS 50+ PO) Take 1 capsule by mouth daily.    Marland Kitchen warfarin (COUMADIN) 5 MG tablet TAKE AS DIRECTED BY COUMADIN CLINIC (Patient taking differently: Take 5-7.5 mg by mouth See admin instructions. 7.5 mg at bedtime on Sun/Tues/Wed/Thurs/Sat and 5 mg on Mon/Fri) 50 tablet 1  . tadalafil (CIALIS) 20 MG tablet Take 1 tablet (20 mg total)  by mouth daily as needed for erectile dysfunction. 10 tablet 11   No current facility-administered medications for this visit.     Allergies:   Ibuprofen   Social History:  The patient  reports that he quit smoking about 5 years ago. His smoking use included Cigarettes. He has a 4.50 pack-year smoking history. He has never used smokeless tobacco. He reports that he drinks about 0.6 oz of alcohol per week . He reports that he does not use drugs.   Family History:  The patient's  family history includes Heart disease in his sister; Other  in his father; Sleep apnea in his sister; Stroke in his mother; Thyroid disease in his sister.    ROS:  Please see the history of present illness.   All other systems are reviewed and negative.    PHYSICAL EXAM: Vitals:   10/31/16 0552  BP: (!) 129/107  Pulse: 90  Resp: 18  Temp: 98.5 F (36.9 C)   GEN: Well nourished, well developed, in no acute distress  HEENT: OP clear, atraumatic today Neck: no JVD, carotid bruits, or masses Cardiac: iRRR; no murmurs, rubs, or gallops,no edema  Respiratory:  clear to auscultation bilaterally, normal work of breathing GI: soft, nontender, nondistended, + BS MS: no deformity or atrophy  Skin: warm and dry  Neuro:  Strength and sensation are intact Psych: euthymic mood, full affect    ASSESSMENT AND PLAN:   1.  Persistent afib Failed medical therapy with flecainide.  s/p ablation x 2 Therapeutic strategies for afib including medicine and repeat ablation were discussed in detail with the patient today. Risk, benefits, and alternatives to EP study and radiofrequency ablation for afib were also discussed in detail today. These risks include but are not limited to stroke, bleeding, vascular damage, tamponade, perforation, damage to the esophagus, lungs, and other structures, pulmonary vein stenosis, worsening renal function, and death. The patient understands these risk and wishes to proceed.   TEE is reviewed and normal.  Awaiting INR today   2. HTN Stable No change required today  Thompson Grayer MD, [facility] 10/31/2016 7:17 AM

## 2016-10-31 NOTE — Transfer of Care (Signed)
Immediate Anesthesia Transfer of Care Note  Patient: [name] [name]  Procedure(s) Performed: Procedure(s): Atrial Fibrillation Ablation (N/A)  Patient Location: Cath Lab  Anesthesia Type:General  Level of Consciousness: awake, alert  and oriented  Airway & Oxygen Therapy: Patient Spontanous Breathing and Patient connected to nasal cannula oxygen  Post-op Assessment: Report given to RN and Post -op Vital signs reviewed and stable  Post vital signs: Reviewed and stable  Last Vitals:  Vitals:   10/31/16 0552 10/31/16 1017  BP: (!) 129/107   Pulse: 90   Resp: 18   Temp: 36.9 C 36.3 C    Last Pain:  Vitals:   10/31/16 0552  TempSrc: Oral         Complications: No apparent anesthesia complications

## 2016-11-01 ENCOUNTER — Encounter (HOSPITAL_COMMUNITY): Payer: Self-pay | Admitting: Internal Medicine

## 2016-11-01 DIAGNOSIS — I10 Essential (primary) hypertension: Secondary | ICD-10-CM | POA: Diagnosis not present

## 2016-11-01 DIAGNOSIS — Z87891 Personal history of nicotine dependence: Secondary | ICD-10-CM | POA: Diagnosis not present

## 2016-11-01 DIAGNOSIS — I481 Persistent atrial fibrillation: Secondary | ICD-10-CM | POA: Diagnosis not present

## 2016-11-01 DIAGNOSIS — I4892 Unspecified atrial flutter: Secondary | ICD-10-CM | POA: Diagnosis not present

## 2016-11-01 DIAGNOSIS — I4891 Unspecified atrial fibrillation: Secondary | ICD-10-CM | POA: Diagnosis not present

## 2016-11-01 DIAGNOSIS — E785 Hyperlipidemia, unspecified: Secondary | ICD-10-CM | POA: Diagnosis not present

## 2016-11-01 DIAGNOSIS — Z7901 Long term (current) use of anticoagulants: Secondary | ICD-10-CM | POA: Diagnosis not present

## 2016-11-01 DIAGNOSIS — K219 Gastro-esophageal reflux disease without esophagitis: Secondary | ICD-10-CM | POA: Diagnosis not present

## 2016-11-01 LAB — PROTIME-INR
INR: 3.5
Prothrombin Time: 36 seconds — ABNORMAL HIGH (ref 11.4–15.2)

## 2016-11-01 MED ORDER — PANTOPRAZOLE SODIUM 40 MG PO TBEC: 40.0000 mg | DELAYED_RELEASE_TABLET | Freq: Every day | ORAL | 0 refills | Status: DC

## 2016-11-01 NOTE — Discharge Instructions (Signed)
No driving for 4 days. No lifting over 5 lbs for 1 week. No vigorous or sexual activity for 1 week. You may return to work on 11/11/16. Keep procedure site clean & dry. If you notice increased pain, swelling, bleeding or pus, call/return!  You may shower, but no soaking baths/hot tubs/pools for 1 week.    You have an appointment set up with the [facility].  Multiple studies have shown that being followed by a dedicated atrial fibrillation clinic in addition to the standard care you receive from your other physicians improves health. We believe that enrollment in the atrial fibrillation clinic will allow [place] to better care for you.   The phone number to the [facility] is [phone]. The clinic is staffed Monday through Friday from 8:30am to 5pm.  Parking Directions: The clinic is located in the Heart and Vascular Building connected to [facility]. 1)From [address] turn on to [address] and go to the 3rd entrance  (Heart and Vascular entrance) on the right. 2)Look to the right for Heart &Vascular Parking Garage. 3)A code for the entrance is required please call the clinic to receive this.   4)Take the elevators to the 1st floor. Registration is in the room with the glass walls at the end of the hallway.  If you have any trouble parking or locating the clinic, please dont hesitate to call [phone].

## 2016-11-07 ENCOUNTER — Ambulatory Visit (INDEPENDENT_AMBULATORY_CARE_PROVIDER_SITE_OTHER): Payer: BLUE CROSS/BLUE SHIELD

## 2016-11-07 DIAGNOSIS — I481 Persistent atrial fibrillation: Secondary | ICD-10-CM

## 2016-11-07 DIAGNOSIS — Z5181 Encounter for therapeutic drug level monitoring: Secondary | ICD-10-CM

## 2016-11-07 DIAGNOSIS — I4891 Unspecified atrial fibrillation: Secondary | ICD-10-CM

## 2016-11-07 DIAGNOSIS — I4819 Other persistent atrial fibrillation: Secondary | ICD-10-CM

## 2016-11-07 LAB — POCT INR: INR: 2.5

## 2016-11-08 ENCOUNTER — Telehealth (HOSPITAL_COMMUNITY): Payer: Self-pay | Admitting: *Deleted

## 2016-11-08 NOTE — Telephone Encounter (Signed)
Patient called in concerned he had went back into afib post ablation. Went over the expected course after ablation during healing process the first 3 months that afib can be expected. If its persistent we can address it with cardioversion if needed. Pt had been feeling great now "more lazy" and can feel his heart "flopping" States his heart rate is mainly under 100. He will call on Monday if still in afib and we will work him in to be seen. Pt thankful for the information.

## 2016-11-27 ENCOUNTER — Ambulatory Visit (HOSPITAL_COMMUNITY)
Admission: RE | Admit: 2016-11-27 | Discharge: 2016-11-27 | Disposition: A | Discharge status: Home / Self Care | Payer: BLUE CROSS/BLUE SHIELD | Source: Ambulatory Visit | Attending: Nurse Practitioner | Admitting: Nurse Practitioner

## 2016-11-27 ENCOUNTER — Encounter (HOSPITAL_COMMUNITY): Payer: Self-pay | Admitting: Nurse Practitioner

## 2016-11-27 VITALS — BP 102/76 | HR 81 | Ht 72.0 in | Wt 179.8 lb

## 2016-11-27 DIAGNOSIS — I4819 Other persistent atrial fibrillation: Secondary | ICD-10-CM

## 2016-11-27 DIAGNOSIS — K219 Gastro-esophageal reflux disease without esophagitis: Secondary | ICD-10-CM | POA: Diagnosis not present

## 2016-11-27 DIAGNOSIS — N528 Other male erectile dysfunction: Secondary | ICD-10-CM | POA: Diagnosis not present

## 2016-11-27 DIAGNOSIS — Z79899 Other long term (current) drug therapy: Secondary | ICD-10-CM | POA: Insufficient documentation

## 2016-11-27 DIAGNOSIS — I481 Persistent atrial fibrillation: Secondary | ICD-10-CM | POA: Insufficient documentation

## 2016-11-27 DIAGNOSIS — Z8349 Family history of other endocrine, nutritional and metabolic diseases: Secondary | ICD-10-CM | POA: Insufficient documentation

## 2016-11-27 DIAGNOSIS — Z8249 Family history of ischemic heart disease and other diseases of the circulatory system: Secondary | ICD-10-CM | POA: Diagnosis not present

## 2016-11-27 DIAGNOSIS — M1990 Unspecified osteoarthritis, unspecified site: Secondary | ICD-10-CM | POA: Diagnosis not present

## 2016-11-27 DIAGNOSIS — Z888 Allergy status to other drugs, medicaments and biological substances status: Secondary | ICD-10-CM | POA: Insufficient documentation

## 2016-11-27 DIAGNOSIS — E785 Hyperlipidemia, unspecified: Secondary | ICD-10-CM | POA: Insufficient documentation

## 2016-11-27 DIAGNOSIS — Z7901 Long term (current) use of anticoagulants: Secondary | ICD-10-CM | POA: Insufficient documentation

## 2016-11-27 DIAGNOSIS — Z823 Family history of stroke: Secondary | ICD-10-CM | POA: Diagnosis not present

## 2016-11-27 DIAGNOSIS — I10 Essential (primary) hypertension: Secondary | ICD-10-CM | POA: Insufficient documentation

## 2016-11-27 DIAGNOSIS — Z87891 Personal history of nicotine dependence: Secondary | ICD-10-CM | POA: Diagnosis not present

## 2016-11-27 LAB — CBC
HCT: 42.7 % (ref 39.0–52.0)
HEMOGLOBIN: 14.4 g/dL (ref 13.0–17.0)
MCH: 29.8 pg (ref 26.0–34.0)
MCHC: 33.7 g/dL (ref 30.0–36.0)
MCV: 88.2 fL (ref 78.0–100.0)
Platelets: 233 10*3/uL (ref 150–400)
RBC: 4.84 MIL/uL (ref 4.22–5.81)
RDW: 13 % (ref 11.5–15.5)
WBC: 11.2 10*3/uL — ABNORMAL HIGH (ref 4.0–10.5)

## 2016-11-27 LAB — BASIC METABOLIC PANEL
Anion gap: 8 (ref 5–15)
BUN: 18 mg/dL (ref 6–20)
CALCIUM: 9.4 mg/dL (ref 8.9–10.3)
CO2: 21 mmol/L — AB (ref 22–32)
CREATININE: 1.13 mg/dL (ref 0.61–1.24)
Chloride: 107 mmol/L (ref 101–111)
GFR calc Af Amer: >60 mL/min (ref >60)
GFR calc non Af Amer: >60 mL/min (ref >60)
GLUCOSE: 113 mg/dL — AB (ref 65–99)
Potassium: 4.7 mmol/L (ref 3.5–5.1)
Sodium: 136 mmol/L (ref 135–145)

## 2016-11-27 LAB — PROTIME-INR
INR: 2.29
Prothrombin Time: 25.6 seconds — ABNORMAL HIGH (ref 11.4–15.2)

## 2016-11-27 NOTE — Patient Instructions (Signed)
Your physician has recommended you make the following change in your medication:  1)STOP Norvasc (Amlodipine) 2)do not take any more cardizem (diltiazem) 30mg  tablets   Cardioversion scheduled for Monday, July 2nd  - Arrive at the [facility] and go to admitting at 8:30am  -Do not eat or drink anything after midnight the night prior to your procedure.  - Take all your medication with a sip of water prior to arrival.  - You will not be able to drive home after your procedure.

## 2016-11-27 NOTE — Progress Notes (Signed)
Primary Care Physician: Biagio Borg, MD Referring Physician: Luana Shu is a 56 y.o. male with a h/o afib that is here for f/u of ablation 5/31. Pt states that he felt well for 3 days after the ablation but has had symptoms of palpitations, weakness and lightheadedness since then. He did take a 30 mg Cardizem this am but has felt more lightheaded today with BP at 102/76, standing BP at 90/68. He was afraid to call earlier as he wanted to avoid cardioversion. He is on warfarin with INR due today.  Today, he denies symptoms of palpitations, chest pain, shortness of breath, orthopnea, PND, lower extremity edema, dizziness, presyncope, syncope, or neurologic sequela. The patient is tolerating medications without difficulties and is otherwise without complaint today.   Past Medical History:  Diagnosis Date  . Arthritis    "maybe in my hands" (10/31/2016)  . Atrial flutter ([place])   . CHEST PAIN-PRECORDIAL 05/25/2009  . ERECTILE DYSFUNCTION, ORGANIC 01/16/2010  . GERD (gastroesophageal reflux disease)   . HYPERLIPIDEMIA 12/15/2007  . HYPERTENSION 12/15/2007  . HYPOKALEMIA 12/15/2007  . Internal hemorrhoids   . Paroxysmal atrial fibrillation ([place]) 12/15/2007  . POSTURAL LIGHTHEADEDNESS 07/06/2008   Past Surgical History:  Procedure Laterality Date  . ATRIAL FIBRILLATION ABLATION N/A 08/13/2011   Procedure: ATRIAL FIBRILLATION ABLATION;  Surgeon: Thompson Grayer, MD;  Location[facility] CATH LAB;  Service: Cardiovascular;  Laterality: N/A;  . ATRIAL FIBRILLATION ABLATION  10/31/2016  . ATRIAL FIBRILLATION ABLATION N/A 10/31/2016   Procedure: Atrial Fibrillation Ablation;  Surgeon: Thompson Grayer, MD;  Location: [facility] CV LAB;  Service: Cardiovascular;  Laterality: N/A;  . ATRIAL FLUTTER ABLATION N/A 12/27/2011   CTI repeat ablation by Dr Rayann Heman  . CARDIOVERSION  11/21/2011   Procedure: CARDIOVERSION;  Surgeon: Larey Dresser, MD;  Location: [place];  Service: Cardiovascular;   Laterality: N/A;  . CHALAZION EXCISION Right   . ELBOW SURGERY Right early 1980s   bone spur/chip; "opened me up"  . INGUINAL HERNIA REPAIR Right 11/2006  . TEE WITHOUT CARDIOVERSION  08/12/2011   Procedure: TRANSESOPHAGEAL ECHOCARDIOGRAM (TEE);  Surgeon: Fay Records, MD;  Location: [facility] ENDOSCOPY;  Service: Cardiovascular;  Laterality: N/A;  . TEE WITHOUT CARDIOVERSION N/A 10/30/2016   Procedure: TRANSESOPHAGEAL ECHOCARDIOGRAM (TEE);  Surgeon: Josue Hector, MD;  Location: [facility] ENDOSCOPY;  Service: Cardiovascular;  Laterality: N/A;    Current Outpatient Prescriptions  Medication Sig Dispense Refill  . atorvastatin (LIPITOR) 20 MG tablet Take 1 tablet (20 mg total) by mouth daily. 90 tablet 3  . diltiazem (CARDIZEM CD) 120 MG 24 hr capsule TAKE 1 CAPSULE BY MOUTH EVERY DAY 90 capsule 3  . diltiazem (CARDIZEM) 30 MG tablet Take 30 mg by mouth as needed.    . doxazosin (CARDURA) 4 MG tablet Take 1 tablet (4 mg total) by mouth at bedtime. 90 tablet 2  . Glutamine 500 MG TABS Take by mouth.    . irbesartan (AVAPRO) 300 MG tablet TAKE 1 TABLET BY MOUTH EVERY MORNING 90 tablet 1  . L-FORMULA LYSINE HCL PO Take by mouth.    . Multiple Vitamins-Minerals (MULTIVITAMIN ADULTS 50+ PO) Take 1 capsule by mouth daily.    . pantoprazole (PROTONIX) 40 MG tablet Take 1 tablet (40 mg total) by mouth daily. 45 tablet 0  . Polyvinyl Alcohol-Povidone (REFRESH OP) Place 2 drops into both eyes as needed (for dry eyes).    . warfarin (COUMADIN) 5 MG tablet TAKE AS DIRECTED BY COUMADIN  CLINIC (Patient taking differently: Take 5-7.5 mg by mouth See admin instructions. Take 7.5 mg by mouth on Monday and Friday. Take 5 mg by mouth on all other days.) 50 tablet 1  . tadalafil (CIALIS) 20 MG tablet Take 1 tablet (20 mg total) by mouth daily as needed for erectile dysfunction. 10 tablet 11   No current facility-administered medications for this encounter.     Allergies  Allergen Reactions  . Ibuprofen Other (See  Comments)    Rapid heart beat    Social History   Social History  . Marital status: Married    Spouse name: N/A  . Number of children: 2  . Years of education: N/A   Occupational History  . [employer]   Social History Main Topics  . Smoking status: Former Smoker    Packs/day: 0.10    Years: 15.00    Types: Cigarettes    Quit date: 12/17/2010  . Smokeless tobacco: Never Used  . Alcohol use Yes     Comment: "1 glass of wine/month maybe" (10/31/2016)  . Drug use: No  . Sexual activity: Not Currently   Other Topics Concern  . Not on file   Social History Narrative   Pt lives in [place] with spouse. 2 grown children.   Works at [employer] in [place].    Family History  Problem Relation Age of Onset  . Stroke Mother   . Heart disease Sister   . Thyroid disease Sister   . Sleep apnea Sister   . Other Father        deceased stomach hemorrhage  . Anesthesia problems Neg Hx     ROS- All systems are reviewed and negative except as per the HPI above  Physical Exam: Vitals:   11/27/16 1343  BP: 102/76  Pulse: 81  Weight: 179 lb 12.8 oz (81.6 kg)  Height: 6' (1.829 m)   Wt Readings from Last 3 Encounters:  11/27/16 179 lb 12.8 oz (81.6 kg)  11/01/16 191 lb 5.8 oz (86.8 kg)  08/06/16 190 lb (86.2 kg)    Labs: Lab Results  Component Value Date   NA 136 11/27/2016   K 4.7 11/27/2016   CL 107 11/27/2016   CO2 21 (L) 11/27/2016   GLUCOSE 113 (H) 11/27/2016   BUN 18 11/27/2016   CREATININE 1.13 11/27/2016   CALCIUM 9.4 11/27/2016   MG 2.0 11/13/2011   Lab Results  Component Value Date   INR 2.29 11/27/2016   Lab Results  Component Value Date   CHOL 159 08/09/2016   HDL 55.00 08/09/2016   LDLCALC 75 08/09/2016   TRIG 147.0 08/09/2016     GEN- The patient is well appearing, alert and oriented x 3 today.   Head- normocephalic, atraumatic Eyes-  Sclera clear, conjunctiva pink Ears- hearing intact Oropharynx- clear Neck-  supple, no JVP Lymph- no cervical lymphadenopathy Lungs- Clear to ausculation bilaterally, normal work of breathing Heart- irregular rate and rhythm, no murmurs, rubs or gallops, PMI not laterally displaced GI- soft, NT, ND, + BS Extremities- no clubbing, cyanosis, or edema MS- no significant deformity or atrophy Skin- no rash or lesion Psych- euthymic mood, full affect Neuro- strength and sensation are intact  EKG-afib  at 81 bpm, qrs int 76 ms, qtc 413 ms Epic records reviewed    Assessment and Plan: 1.  Symptomatic  persisitent afib S/p ablation with ERAF, possibly returning after 3 days of procedure Will schedule for TEE guided cardioversion since pt is  on warfarin  and do not have 4 weekly INR's documented  Do not take any additional 30 mg cardizem, for now, as I suspect he is not having RVR, tablets for fear of hypotension He was encouraged to buy a BP cuff for tracking BP/HR at home Hold amlodipine 2.5 mg qd for now  F/u one week s/p cardioversion  Butch Penny C. Elinda Bunten, [facility] [address] [place], [place] [address] [phone]

## 2016-11-29 ENCOUNTER — Other Ambulatory Visit (HOSPITAL_COMMUNITY): Payer: Self-pay | Admitting: *Deleted

## 2016-11-29 ENCOUNTER — Inpatient Hospital Stay (HOSPITAL_COMMUNITY): Admit: 2016-11-29 | Payer: Self-pay | Admitting: Nurse Practitioner

## 2016-11-29 NOTE — Progress Notes (Signed)
It was recommended patient have home BP machine. Pt states his insurance will provide one if orders from physician -- Order sent for automatic blood pressure cuff to [facility].

## 2016-12-02 ENCOUNTER — Encounter (HOSPITAL_COMMUNITY)
Admission: RE | Disposition: A | Discharge status: Home / Self Care | Payer: Self-pay | Source: Ambulatory Visit | Attending: Internal Medicine

## 2016-12-02 ENCOUNTER — Ambulatory Visit (HOSPITAL_COMMUNITY): Payer: BLUE CROSS/BLUE SHIELD | Admitting: Certified Registered Nurse Anesthetist

## 2016-12-02 ENCOUNTER — Ambulatory Visit (HOSPITAL_COMMUNITY)
Admission: RE | Admit: 2016-12-02 | Discharge: 2016-12-02 | Disposition: A | Discharge status: Home / Self Care | Payer: BLUE CROSS/BLUE SHIELD | Source: Ambulatory Visit | Attending: Internal Medicine | Admitting: Internal Medicine

## 2016-12-02 ENCOUNTER — Ambulatory Visit (HOSPITAL_BASED_OUTPATIENT_CLINIC_OR_DEPARTMENT_OTHER): Payer: BLUE CROSS/BLUE SHIELD

## 2016-12-02 ENCOUNTER — Encounter (HOSPITAL_COMMUNITY): Payer: Self-pay

## 2016-12-02 DIAGNOSIS — Z87891 Personal history of nicotine dependence: Secondary | ICD-10-CM | POA: Insufficient documentation

## 2016-12-02 DIAGNOSIS — R001 Bradycardia, unspecified: Secondary | ICD-10-CM | POA: Diagnosis not present

## 2016-12-02 DIAGNOSIS — I4891 Unspecified atrial fibrillation: Secondary | ICD-10-CM

## 2016-12-02 DIAGNOSIS — K219 Gastro-esophageal reflux disease without esophagitis: Secondary | ICD-10-CM | POA: Diagnosis not present

## 2016-12-02 DIAGNOSIS — I340 Nonrheumatic mitral (valve) insufficiency: Secondary | ICD-10-CM | POA: Diagnosis not present

## 2016-12-02 DIAGNOSIS — I10 Essential (primary) hypertension: Secondary | ICD-10-CM | POA: Diagnosis not present

## 2016-12-02 DIAGNOSIS — R1013 Epigastric pain: Secondary | ICD-10-CM | POA: Diagnosis not present

## 2016-12-02 DIAGNOSIS — I481 Persistent atrial fibrillation: Secondary | ICD-10-CM | POA: Insufficient documentation

## 2016-12-02 DIAGNOSIS — M1990 Unspecified osteoarthritis, unspecified site: Secondary | ICD-10-CM | POA: Insufficient documentation

## 2016-12-02 DIAGNOSIS — E785 Hyperlipidemia, unspecified: Secondary | ICD-10-CM | POA: Diagnosis not present

## 2016-12-02 HISTORY — PX: TEE WITHOUT CARDIOVERSION: SHX5443

## 2016-12-02 HISTORY — PX: CARDIOVERSION: SHX1299

## 2016-12-02 LAB — PROTIME-INR
INR: 3.08
PROTHROMBIN TIME: 32.5 s — AB (ref 11.4–15.2)

## 2016-12-02 SURGERY — ECHOCARDIOGRAM, TRANSESOPHAGEAL
Anesthesia: Monitor Anesthesia Care

## 2016-12-02 MED ORDER — SODIUM CHLORIDE 0.9 % IV SOLN: INTRAVENOUS | Status: DC

## 2016-12-02 MED ORDER — PROPOFOL 500 MG/50ML IV EMUL
INTRAVENOUS | Status: DC | PRN
  Administered 2016-12-02: 100 ug/kg/min via INTRAVENOUS

## 2016-12-02 MED ORDER — LACTATED RINGERS IV SOLN
INTRAVENOUS | Status: DC
  Administered 2016-12-02: 09:00:00 via INTRAVENOUS

## 2016-12-02 MED ORDER — PROPOFOL 10 MG/ML IV BOLUS
INTRAVENOUS | Status: DC | PRN
  Administered 2016-12-02 (×3): 20 mg via INTRAVENOUS

## 2016-12-02 NOTE — Anesthesia Preprocedure Evaluation (Signed)
Anesthesia Evaluation  Patient identified by MRN, date of birth, ID band Patient awake    Reviewed: Allergy & Precautions, NPO status , Patient's Chart, lab work & pertinent test results  Airway Mallampati: II  TM Distance: >3 FB     Dental   Pulmonary former smoker,    breath sounds clear to auscultation       Cardiovascular hypertension, Pt. on medications + dysrhythmias Atrial Fibrillation  Rhythm:Regular Rate:Normal     Neuro/Psych    GI/Hepatic Neg liver ROS, GERD  ,  Endo/Other  negative endocrine ROS  Renal/GU negative Renal ROS     Musculoskeletal  (+) Arthritis ,   Abdominal   Peds  Hematology   Anesthesia Other Findings   Reproductive/Obstetrics                             Anesthesia Physical  Anesthesia Plan  ASA: III  Anesthesia Plan: MAC   Post-op Pain Management:    Induction: Intravenous  PONV Risk Score and Plan: 1 and Ondansetron  Airway Management Planned:   Additional Equipment:   Intra-op Plan:   Post-operative Plan:   Informed Consent: I have reviewed the patients History and Physical, chart, labs and discussed the procedure including the risks, benefits and alternatives for the proposed anesthesia with the patient or authorized representative who has indicated his/her understanding and acceptance.   Dental advisory given  Plan Discussed with: CRNA and Anesthesiologist  Anesthesia Plan Comments:         Anesthesia Quick Evaluation

## 2016-12-02 NOTE — Anesthesia Postprocedure Evaluation (Signed)
Anesthesia Post Note  Patient: [name] [name]  Procedure(s) Performed: Procedure(s) (LRB): TRANSESOPHAGEAL ECHOCARDIOGRAM (TEE) (N/A) CARDIOVERSION (N/A)     Patient location during evaluation: PACU Anesthesia Type: MAC Level of consciousness: awake and alert Pain management: pain level controlled Vital Signs Assessment: post-procedure vital signs reviewed and stable Respiratory status: spontaneous breathing, nonlabored ventilation and respiratory function stable Cardiovascular status: stable and blood pressure returned to baseline Anesthetic complications: no    Last Vitals:  Vitals:   12/02/16 0906 12/02/16 1022  BP: (!) 122/97 124/82  Pulse: 84 (!) 46  Resp: 19 12  Temp: 36.5 C 36.4 C    Last Pain:  Vitals:   12/02/16 1022  TempSrc: Oral                 Lynda Rainwater

## 2016-12-02 NOTE — Progress Notes (Signed)
  Echocardiogram Echocardiogram Transesophageal has been performed.  [name] [name] 12/02/2016, 10:25 AM

## 2016-12-02 NOTE — Transfer of Care (Signed)
Immediate Anesthesia Transfer of Care Note  Patient: [name] [name]  Procedure(s) Performed: Procedure(s): TRANSESOPHAGEAL ECHOCARDIOGRAM (TEE) (N/A) CARDIOVERSION (N/A)  Patient Location: Endoscopy Unit  Anesthesia Type:MAC and General  Level of Consciousness: awake, alert  and oriented  Airway & Oxygen Therapy: Patient Spontanous Breathing  Post-op Assessment: Report given to RN and Post -op Vital signs reviewed and stable  Post vital signs: Reviewed and stable  Last Vitals:  Vitals:   12/02/16 0906 12/02/16 1022  BP: (!) 122/97 124/82  Pulse: 84 (!) 46  Resp: 19 12  Temp: 36.5 C 36.4 C    Last Pain:  Vitals:   12/02/16 1022  TempSrc: Oral         Complications: No apparent anesthesia complications

## 2016-12-02 NOTE — H&P (Signed)
   INTERVAL PROCEDURE H&P  History and Physical Interval Note:  12/02/2016 9:34 AM  [name] [name] has presented today for their planned procedure. The various methods of treatment have been discussed with the patient and family. After consideration of risks, benefits and other options for treatment, the patient has consented to the procedure.  The patients' outpatient history has been reviewed, patient examined, and no change in status from most recent office note within the past 30 days. I have reviewed the patients' chart and labs and will proceed as planned. Questions were answered to the patient's satisfaction.   [name], MD, [name]  Attending Cardiologist  Direct Dial: [phone]  Fax: [phone]  [name] 12/02/2016, 9:34 AM

## 2016-12-02 NOTE — Discharge Instructions (Signed)
WORK EXCUSE:  Please excuse [name] from work until after his follow-up appointment on 12/09/2016, pertaining to his recent procedure.  [name], MD, [place]  Attending Cardiologist  Direct Dial: [phone]   Fax: [phone]  Website:  [email or web address]    TEE  YOU HAD AN CARDIAC PROCEDURE TODAY: Refer to the procedure report and other information in the discharge instructions given to you for any specific questions about what was found during the examination. If this information does not answer your questions, please call [facility] office at [phone] to clarify.   DIET: Your first meal following the procedure should be a light meal and then it is ok to progress to your normal diet. A half-sandwich or bowl of soup is an example of a good first meal. Heavy or fried foods are harder to digest and may make you feel nauseous or bloated. Drink plenty of fluids but you should avoid alcoholic beverages for 24 hours. If you had a esophageal dilation, please see attached instructions for diet.   ACTIVITY: Your care partner should take you home directly after the procedure. You should plan to take it easy, moving slowly for the rest of the day. You can resume normal activity the day after the procedure however YOU SHOULD NOT DRIVE, use power tools, machinery or perform tasks that involve climbing or major physical exertion for 24 hours (because of the sedation medicines used during the test).   SYMPTOMS TO REPORT IMMEDIATELY: A cardiologist can be reached at any hour. Please call [phone] for any of the following symptoms:  Vomiting of blood or coffee ground material  New, significant abdominal pain  New, significant chest pain or pain under the shoulder blades  Painful or persistently difficult swallowing  New shortness of breath  Black, tarry-looking or red, bloody stools  FOLLOW UP:  Please also call with any specific questions about  appointments or follow up tests.   Electrical Cardioversion, Care After This sheet gives you information about how to care for yourself after your procedure. Your health care provider may also give you more specific instructions. If you have problems or questions, contact your health care provider. What can I expect after the procedure? After the procedure, it is common to have:  Some redness on the skin where the shocks were given.  Follow these instructions at home:  Do not drive for 24 hours if you were given a medicine to help you relax (sedative).  Take over-the-counter and prescription medicines only as told by your health care provider.  Ask your health care provider how to check your pulse. Check it often.  Rest for 48 hours after the procedure or as told by your health care provider.  Avoid or limit your caffeine use as told by your health care provider. Contact a health care provider if:  You feel like your heart is beating too quickly or your pulse is not regular.  You have a serious muscle cramp that does not go away. Get help right away if:  You have discomfort in your chest.  You are dizzy or you feel faint.  You have trouble breathing or you are short of breath.  Your speech is slurred.  You have trouble moving an arm or leg on one side of your body.  Your fingers or toes turn cold or blue. This information is not intended to replace advice given to you by your health care provider. Make sure you discuss  any questions you have with your health care provider. Document Released: 03/10/2013 Document Revised: 12/22/2015 Document Reviewed: 11/24/2015 Elsevier Interactive Patient Education  [employer].

## 2016-12-02 NOTE — CV Procedure (Signed)
TEE/CARDIOVERSION NOTE  TRANSESOPHAGEAL ECHOCARDIOGRAM (TEE):  Indictation: Atrial Fibrillation  Consent:   Informed consent was obtained prior to the procedure. The risks, benefits and alternatives for the procedure were discussed and the patient comprehended these risks.  Risks include, but are not limited to, cough, sore throat, vomiting, nausea, somnolence, esophageal and stomach trauma or perforation, bleeding, low blood pressure, aspiration, pneumonia, infection, trauma to the teeth and death.    Time Out: Verified patient identification, verified procedure, site/side was marked, verified correct patient position, special equipment/implants available, medications/allergies/relevent history reviewed, required imaging and test results available. Performed  Procedure:  After a procedural time-out, the patient was given Propofol per anesthesia for sedation. The patient's heart rate, blood pressure, and oxygen saturation are monitored continuously during the procedure. The oropharynx was anesthetized 10 cc of topical 1% viscous lidocaine and 2 cetacaine sprays.  The transesophageal probe was inserted in the esophagus and stomach without difficulty and multiple views were obtained. Agitated microbubble saline contrast was not administered.  Complications:    Complications: None Patient did tolerate procedure well.  Findings:  1. LEFT VENTRICLE: The left ventricular wall thickness is mildly increased.  The left ventricular cavity is normal in size. Wall motion is normal.  LVEF is 55-60%.  2. RIGHT VENTRICLE:  The right ventricle is normal in structure and function without any thrombus or masses.    3. LEFT ATRIUM:  The left atrium is moderately dialted in size without any thrombus or masses.  There is not spontaneous echo contrast ("smoke") in the left atrium consistent with a low flow state.  4. LEFT ATRIAL APPENDAGE:  The large left atrial appendage is free of any thrombus or  masses. The appendage has single lobes. Pulse doppler indicates low flow in the appendage.  5. ATRIAL SEPTUM:  The atrial septum appears intact and is free of thrombus and/or masses.  There is no evidence for interatrial shunting by color doppler and saline microbubble.  6. RIGHT ATRIUM:  The right atrium is normal in size and function without any thrombus or masses.  7. MITRAL VALVE:  The mitral valve is normal in structure and function with Mild regurgitation.  There were no vegetations or stenosis.  8. AORTIC VALVE:  The aortic valve is trileaflet, normal in structure and function with no regurgitation.  There were no vegetations or stenosis  9. TRICUSPID VALVE:  The tricuspid valve is normal in structure and function with trivial regurgitation.  There were no vegetations or stenosis  10.  PULMONIC VALVE:  The pulmonic valve is normal in structure and function with no regurgitation.  There were no vegetations or stenosis.   11. AORTIC ARCH, ASCENDING AND DESCENDING AORTA:  There was no Ron Parker et. Al, 1992) atherosclerosis of the ascending aorta, aortic arch, or proximal descending aorta.  12. PULMONARY VEINS: Anomalous pulmonary venous return was not noted.  13. PERICARDIUM: The pericardium appeared normal and non-thickened.  There is no pericardial effusion.  CARDIOVERSION:     Second Time Out: Verified patient identification, verified procedure, site/side was marked, verified correct patient position, special equipment/implants available, medications/allergies/relevent history reviewed, required imaging and test results available.  Performed  Procedure:  1. Patient placed on cardiac monitor, pulse oximetry, supplemental oxygen as necessary.  2. Sedation administered per anesthesia 3. Pacer pads placed anterior and posterior chest. 4. Cardioverted 2 time(s).  5. Cardioverted at 150J and 200J biphasic.  Complications:  Complications: None Patient did tolerate procedure  well.  Impression:  1. No LAA  thrombus 2. Ultimately successful DCCV to sinus bradycardia.  Recommendations:  1. Discontinue diltiazem LA 120 mg daily (due to low heartrate) 2. Follow-up with Doristine Devoid, NP in the a-fib clinic.  Time Spent Directly with the Patient:  45 minutes   [name], MD, [place]  Attending Cardiologist  Direct Dial: [phone]  Fax: [phone]  Website:  [email or web address]  Nadean Corwin Ferdie Bakken 12/02/2016, 10:21 AM

## 2016-12-05 ENCOUNTER — Telehealth (HOSPITAL_COMMUNITY): Payer: Self-pay | Admitting: *Deleted

## 2016-12-05 MED ORDER — AMLODIPINE BESYLATE 2.5 MG PO TABS: 2.5000 mg | ORAL_TABLET | Freq: Every day | ORAL | 3 refills | Status: DC

## 2016-12-05 NOTE — Telephone Encounter (Signed)
Pt states since cardioversion his BP has been elevated and he has been having headaches. Prior to cardioversion his amlodipine was held due to hypotension and after cardioversion his cardizem was stopped due to bradycardia.  BP running 150/90s HR 52-57. Discussed with [name] will restart amlodipine now and pt will notify if bp still elevated. Pt in agreement.

## 2016-12-06 ENCOUNTER — Telehealth (HOSPITAL_COMMUNITY): Payer: Self-pay | Admitting: *Deleted

## 2016-12-06 MED ORDER — AMLODIPINE BESYLATE 5 MG PO TABS: 5.0000 mg | ORAL_TABLET | Freq: Every day | ORAL | 3 refills | Status: DC

## 2016-12-06 NOTE — Telephone Encounter (Signed)
Pt BP still elevated in the 130s/90s since not on the diltiazem due to heart rate. Discussed with [name] will increase norvasc to 5mg  daily. Pt verbalized understanding.

## 2016-12-09 ENCOUNTER — Ambulatory Visit (HOSPITAL_COMMUNITY)
Admission: RE | Admit: 2016-12-09 | Discharge: 2016-12-09 | Disposition: A | Discharge status: Home / Self Care | Payer: BLUE CROSS/BLUE SHIELD | Source: Ambulatory Visit | Attending: Nurse Practitioner | Admitting: Nurse Practitioner

## 2016-12-09 ENCOUNTER — Encounter (HOSPITAL_COMMUNITY): Payer: Self-pay | Admitting: Nurse Practitioner

## 2016-12-09 VITALS — BP 116/82 | HR 113 | Ht 72.0 in | Wt 183.8 lb

## 2016-12-09 DIAGNOSIS — E785 Hyperlipidemia, unspecified: Secondary | ICD-10-CM | POA: Insufficient documentation

## 2016-12-09 DIAGNOSIS — I4819 Other persistent atrial fibrillation: Secondary | ICD-10-CM

## 2016-12-09 DIAGNOSIS — Z7901 Long term (current) use of anticoagulants: Secondary | ICD-10-CM | POA: Diagnosis not present

## 2016-12-09 DIAGNOSIS — I4891 Unspecified atrial fibrillation: Secondary | ICD-10-CM | POA: Diagnosis present

## 2016-12-09 DIAGNOSIS — I481 Persistent atrial fibrillation: Secondary | ICD-10-CM

## 2016-12-09 DIAGNOSIS — K648 Other hemorrhoids: Secondary | ICD-10-CM | POA: Diagnosis not present

## 2016-12-09 DIAGNOSIS — Z87891 Personal history of nicotine dependence: Secondary | ICD-10-CM | POA: Insufficient documentation

## 2016-12-09 DIAGNOSIS — K219 Gastro-esophageal reflux disease without esophagitis: Secondary | ICD-10-CM | POA: Insufficient documentation

## 2016-12-09 DIAGNOSIS — I480 Paroxysmal atrial fibrillation: Secondary | ICD-10-CM | POA: Diagnosis not present

## 2016-12-09 DIAGNOSIS — I10 Essential (primary) hypertension: Secondary | ICD-10-CM | POA: Insufficient documentation

## 2016-12-09 LAB — PROTIME-INR
INR: 2.43
Prothrombin Time: 26.8 seconds — ABNORMAL HIGH (ref 11.4–15.2)

## 2016-12-09 MED ORDER — AMLODIPINE BESYLATE 5 MG PO TABS: 2.5000 mg | ORAL_TABLET | Freq: Every day | ORAL | 3 refills | Status: DC

## 2016-12-09 MED ORDER — DILTIAZEM HCL ER COATED BEADS 120 MG PO CP24: 120.0000 mg | ORAL_CAPSULE | Freq: Every day | ORAL | 11 refills | Status: DC

## 2016-12-09 NOTE — Patient Instructions (Signed)
Your physician has recommended you make the following change in your medication:  1)reduce amlodipine to 2.5mg  once a day 2)restart diltiazem 120mg  once a day  Call with report of heart rate and blood pressure

## 2016-12-09 NOTE — Progress Notes (Signed)
Primary Care Physician: Biagio Borg, MD Referring Physician: Luana Shu is a 56 y.o. male with a h/o afib that is here for f/u of ablation 5/31. Pt states that he felt well for 3 days after the ablation but has had symptoms of palpitations, weakness and lightheadedness since then. He did take a 30 mg Cardizem this am but has felt more lightheaded today with BP at 102/76, standing BP at 90/68. He was afraid to call earlier as he wanted to avoid cardioversion. He is on warfarin with INR due today.  F/u in afib clinic, he had successful cardioversion and then brady. Daily cardizem was stopped and amlodipine was increased to cover elevated BP. He states that he felt afib return yesterday. He is in afib with v rate of 113 bpm.  Today, he denies symptoms of palpitations, chest pain, shortness of breath, orthopnea, PND, lower extremity edema, dizziness, presyncope, syncope, or neurologic sequela. The patient is tolerating medications without difficulties and is otherwise without complaint today.   Past Medical History:  Diagnosis Date  . Arthritis    "maybe in my hands" (10/31/2016)  . Atrial flutter ([place])   . CHEST PAIN-PRECORDIAL 05/25/2009  . ERECTILE DYSFUNCTION, ORGANIC 01/16/2010  . GERD (gastroesophageal reflux disease)   . HYPERLIPIDEMIA 12/15/2007  . HYPERTENSION 12/15/2007  . HYPOKALEMIA 12/15/2007  . Internal hemorrhoids   . Paroxysmal atrial fibrillation ([place]) 12/15/2007  . POSTURAL LIGHTHEADEDNESS 07/06/2008   Past Surgical History:  Procedure Laterality Date  . ATRIAL FIBRILLATION ABLATION N/A 08/13/2011   Procedure: ATRIAL FIBRILLATION ABLATION;  Surgeon: Thompson Grayer, MD;  Location: [facility] CATH LAB;  Service: Cardiovascular;  Laterality: N/A;  . ATRIAL FIBRILLATION ABLATION  10/31/2016  . ATRIAL FIBRILLATION ABLATION N/A 10/31/2016   Procedure: Atrial Fibrillation Ablation;  Surgeon: Thompson Grayer, MD;  Location: [place] CV LAB;  Service: Cardiovascular;   Laterality: N/A;  . ATRIAL FLUTTER ABLATION N/A 12/27/2011   CTI repeat ablation by Dr Rayann Heman  . CARDIOVERSION  11/21/2011   Procedure: CARDIOVERSION;  Surgeon: Larey Dresser, MD;  Location: [place];  Service: Cardiovascular;  Laterality: N/A;  . CARDIOVERSION N/A 12/02/2016   Procedure: CARDIOVERSION;  Surgeon: [name], MD;  Location: [place];  Service: Cardiovascular;  Laterality: N/A;  . CHALAZION EXCISION Right   . ELBOW SURGERY Right early 1980s   bone spur/chip; "opened me up"  . INGUINAL HERNIA REPAIR Right 11/2006  . TEE WITHOUT CARDIOVERSION  08/12/2011   Procedure: TRANSESOPHAGEAL ECHOCARDIOGRAM (TEE);  Surgeon: Fay Records, MD;  Location: [facility] ENDOSCOPY;  Service: Cardiovascular;  Laterality: N/A;  . TEE WITHOUT CARDIOVERSION N/A 10/30/2016   Procedure: TRANSESOPHAGEAL ECHOCARDIOGRAM (TEE);  Surgeon: Josue Hector, MD;  Location: [facility] ENDOSCOPY;  Service: Cardiovascular;  Laterality: N/A;  . TEE WITHOUT CARDIOVERSION N/A 12/02/2016   Procedure: TRANSESOPHAGEAL ECHOCARDIOGRAM (TEE);  Surgeon: [name], MD;  Location: [facility] ENDOSCOPY;  Service: Cardiovascular;  Laterality: N/A;    Current Outpatient Prescriptions  Medication Sig Dispense Refill  . amLODipine (NORVASC) 5 MG tablet Take 0.5 tablets (2.5 mg total) by mouth daily. 90 tablet 3  . atorvastatin (LIPITOR) 20 MG tablet Take 1 tablet (20 mg total) by mouth daily. 90 tablet 3  . doxazosin (CARDURA) 4 MG tablet Take 1 tablet (4 mg total) by mouth at bedtime. 90 tablet 2  . Glutamine 500 MG TABS Take by mouth.    . irbesartan (AVAPRO) 300 MG tablet TAKE 1 TABLET BY MOUTH EVERY MORNING 90  tablet 1  . L-FORMULA LYSINE HCL PO Take by mouth.    . Multiple Vitamins-Minerals (MULTIVITAMIN ADULTS 50+ PO) Take 1 capsule by mouth daily.    . pantoprazole (PROTONIX) 40 MG tablet Take 1 tablet (40 mg total) by mouth daily. 45 tablet 0  . Polyvinyl Alcohol-Povidone (REFRESH OP) Place 2 drops into both eyes as needed (for dry  eyes).    . tadalafil (CIALIS) 20 MG tablet Take 1 tablet (20 mg total) by mouth daily as needed for erectile dysfunction. 10 tablet 11  . warfarin (COUMADIN) 5 MG tablet TAKE AS DIRECTED BY COUMADIN CLINIC (Patient taking differently: Take 5-7.5 mg by mouth See admin instructions. Take 7.5 mg by mouth on Monday and Friday. Take 5 mg by mouth on all other days.) 50 tablet 1  . diltiazem (CARDIZEM CD) 120 MG 24 hr capsule Take 1 capsule (120 mg total) by mouth daily. 30 capsule 11  . diltiazem (CARDIZEM) 30 MG tablet Take 30 mg by mouth as needed.     No current facility-administered medications for this encounter.     Allergies  Allergen Reactions  . Ibuprofen Other (See Comments)    Rapid heart beat    Social History   Social History  . Marital status: Married    Spouse name: N/A  . Number of children: 2  . Years of education: N/A   Occupational History  . [employer]   Social History Main Topics  . Smoking status: Former Smoker    Packs/day: 0.10    Years: 15.00    Types: Cigarettes    Quit date: 12/17/2010  . Smokeless tobacco: Never Used  . Alcohol use Yes     Comment: "1 glass of wine/month maybe" (10/31/2016)  . Drug use: No  . Sexual activity: Not Currently   Other Topics Concern  . Not on file   Social History Narrative   Pt lives in [place] with spouse. 2 grown children.   Works at [employer] in [place].    Family History  Problem Relation Age of Onset  . Stroke Mother   . Heart disease Sister   . Thyroid disease Sister   . Sleep apnea Sister   . Other Father        deceased stomach hemorrhage  . Anesthesia problems Neg Hx     ROS- All systems are reviewed and negative except as per the HPI above  Physical Exam: Vitals:   12/09/16 1408  BP: 116/82  Pulse: (!) 113  Weight: 183 lb 12.8 oz (83.4 kg)  Height: 6' (1.829 m)   Wt Readings from Last 3 Encounters:  12/09/16 183 lb 12.8 oz (83.4 kg)  12/02/16 179 lb (81.2  kg)  11/27/16 179 lb 12.8 oz (81.6 kg)    Labs: Lab Results  Component Value Date   NA 136 11/27/2016   K 4.7 11/27/2016   CL 107 11/27/2016   CO2 21 (L) 11/27/2016   GLUCOSE 113 (H) 11/27/2016   BUN 18 11/27/2016   CREATININE 1.13 11/27/2016   CALCIUM 9.4 11/27/2016   MG 2.0 11/13/2011   Lab Results  Component Value Date   INR 2.43 12/09/2016   Lab Results  Component Value Date   CHOL 159 08/09/2016   HDL 55.00 08/09/2016   LDLCALC 75 08/09/2016   TRIG 147.0 08/09/2016     GEN- The patient is well appearing, alert and oriented x 3 today.   Head- normocephalic, atraumatic Eyes-  Sclera clear, conjunctiva pink  Ears- hearing intact Oropharynx- clear Neck- supple, no JVP Lymph- no cervical lymphadenopathy Lungs- Clear to ausculation bilaterally, normal work of breathing Heart- irregular rate and rhythm, no murmurs, rubs or gallops, PMI not laterally displaced GI- soft, NT, ND, + BS Extremities- no clubbing, cyanosis, or edema MS- no significant deformity or atrophy Skin- no rash or lesion Psych- euthymic mood, full affect Neuro- strength and sensation are intact  EKG-afib  at 113 bpm, qrs int 74 ms, qtc 389 ms Epic records reviewed    Assessment and Plan: 1.  Symptomatic  persisitent afib S/p ablation and recent cardioversion with ERAF  INR today Pt would like to try using daily cardizem first to see if that may restore SR Reduce amlodipine to 2.5 mg daily with addition of Cardizem He will let me know how he is on Thursday/Friday If still in afib, will restart flecainide and if drug does not convert will plan for cardioversion  F/u will depend on  phone call end of week as to SR or afib continues  Butch Penny C. Sparrow Sanzo, [facility] [address] [place], [place] [address] [phone]

## 2016-12-12 ENCOUNTER — Encounter (HOSPITAL_COMMUNITY): Payer: Self-pay | Admitting: Nurse Practitioner

## 2016-12-12 ENCOUNTER — Ambulatory Visit (HOSPITAL_COMMUNITY)
Admission: RE | Admit: 2016-12-12 | Discharge: 2016-12-12 | Disposition: A | Discharge status: Home / Self Care | Payer: BLUE CROSS/BLUE SHIELD | Source: Ambulatory Visit | Attending: Nurse Practitioner | Admitting: Nurse Practitioner

## 2016-12-12 VITALS — BP 128/84 | HR 89 | Ht 72.0 in | Wt 185.0 lb

## 2016-12-12 DIAGNOSIS — Z87891 Personal history of nicotine dependence: Secondary | ICD-10-CM | POA: Insufficient documentation

## 2016-12-12 DIAGNOSIS — I4819 Other persistent atrial fibrillation: Secondary | ICD-10-CM

## 2016-12-12 DIAGNOSIS — Z7901 Long term (current) use of anticoagulants: Secondary | ICD-10-CM | POA: Insufficient documentation

## 2016-12-12 DIAGNOSIS — Z9889 Other specified postprocedural states: Secondary | ICD-10-CM | POA: Insufficient documentation

## 2016-12-12 DIAGNOSIS — K219 Gastro-esophageal reflux disease without esophagitis: Secondary | ICD-10-CM | POA: Insufficient documentation

## 2016-12-12 DIAGNOSIS — E785 Hyperlipidemia, unspecified: Secondary | ICD-10-CM | POA: Insufficient documentation

## 2016-12-12 DIAGNOSIS — I481 Persistent atrial fibrillation: Secondary | ICD-10-CM | POA: Diagnosis not present

## 2016-12-12 DIAGNOSIS — I4891 Unspecified atrial fibrillation: Secondary | ICD-10-CM | POA: Diagnosis present

## 2016-12-12 DIAGNOSIS — I10 Essential (primary) hypertension: Secondary | ICD-10-CM | POA: Diagnosis not present

## 2016-12-12 MED ORDER — FLECAINIDE ACETATE 100 MG PO TABS: 100.0000 mg | ORAL_TABLET | Freq: Two times a day (BID) | ORAL | 3 refills | Status: DC

## 2016-12-12 NOTE — Patient Instructions (Signed)
Your physician has recommended you make the following change in your medication:  1)Start Flecainide 100mg  twice a day

## 2016-12-12 NOTE — Progress Notes (Signed)
Primary Care Physician: Biagio Borg, MD Referring Physician: Luana Shu is a 56 y.o. male with a h/o afib that is here for f/u of ablation 5/31. Pt states that he felt well for 3 days after the ablation but has had symptoms of palpitations, weakness and lightheadedness since then. He was scheduled for a cardioversion which was initially successful, but then had ERAF. He was intially placed back on cardizem which was held after cardioversion for bradycardia. IN the clinic today, he is in rate controlled afib.  Today, he denies symptoms of palpitations, chest pain, shortness of breath, orthopnea, PND, lower extremity edema, dizziness, presyncope, syncope, or neurologic sequela. The patient is tolerating medications without difficulties and is otherwise without complaint today.   Past Medical History:  Diagnosis Date  . Arthritis    "maybe in my hands" (10/31/2016)  . Atrial flutter ([place])   . CHEST PAIN-PRECORDIAL 05/25/2009  . ERECTILE DYSFUNCTION, ORGANIC 01/16/2010  . GERD (gastroesophageal reflux disease)   . HYPERLIPIDEMIA 12/15/2007  . HYPERTENSION 12/15/2007  . HYPOKALEMIA 12/15/2007  . Internal hemorrhoids   . Paroxysmal atrial fibrillation ([place]) 12/15/2007  . POSTURAL LIGHTHEADEDNESS 07/06/2008   Past Surgical History:  Procedure Laterality Date  . ATRIAL FIBRILLATION ABLATION N/A 08/13/2011   Procedure: ATRIAL FIBRILLATION ABLATION;  Surgeon: Thompson Grayer, MD;  Location: [facility] CATH LAB;  Service: Cardiovascular;  Laterality: N/A;  . ATRIAL FIBRILLATION ABLATION  10/31/2016  . ATRIAL FIBRILLATION ABLATION N/A 10/31/2016   Procedure: Atrial Fibrillation Ablation;  Surgeon: Thompson Grayer, MD;  Location: [place] CV LAB;  Service: Cardiovascular;  Laterality: N/A;  . ATRIAL FLUTTER ABLATION N/A 12/27/2011   CTI repeat ablation by Dr Rayann Heman  . CARDIOVERSION  11/21/2011   Procedure: CARDIOVERSION;  Surgeon: Larey Dresser, MD;  Location: [place];  Service:  Cardiovascular;  Laterality: N/A;  . CARDIOVERSION N/A 12/02/2016   Procedure: CARDIOVERSION;  Surgeon: [name], MD;  Location: [place];  Service: Cardiovascular;  Laterality: N/A;  . CHALAZION EXCISION Right   . ELBOW SURGERY Right early 1980s   bone spur/chip; "opened me up"  . INGUINAL HERNIA REPAIR Right 11/2006  . TEE WITHOUT CARDIOVERSION  08/12/2011   Procedure: TRANSESOPHAGEAL ECHOCARDIOGRAM (TEE);  Surgeon: Fay Records, MD;  Location: [facility] ENDOSCOPY;  Service: Cardiovascular;  Laterality: N/A;  . TEE WITHOUT CARDIOVERSION N/A 10/30/2016   Procedure: TRANSESOPHAGEAL ECHOCARDIOGRAM (TEE);  Surgeon: Josue Hector, MD;  Location: [facility] ENDOSCOPY;  Service: Cardiovascular;  Laterality: N/A;  . TEE WITHOUT CARDIOVERSION N/A 12/02/2016   Procedure: TRANSESOPHAGEAL ECHOCARDIOGRAM (TEE);  Surgeon: [name], MD;  Location: [facility] ENDOSCOPY;  Service: Cardiovascular;  Laterality: N/A;    Current Outpatient Prescriptions  Medication Sig Dispense Refill  . amLODipine (NORVASC) 5 MG tablet Take 0.5 tablets (2.5 mg total) by mouth daily. 90 tablet 3  . atorvastatin (LIPITOR) 20 MG tablet Take 1 tablet (20 mg total) by mouth daily. 90 tablet 3  . diltiazem (CARDIZEM CD) 120 MG 24 hr capsule Take 1 capsule (120 mg total) by mouth daily. 30 capsule 11  . diltiazem (CARDIZEM) 30 MG tablet Take 30 mg by mouth as needed.    . doxazosin (CARDURA) 4 MG tablet Take 1 tablet (4 mg total) by mouth at bedtime. 90 tablet 2  . Glutamine 500 MG TABS Take by mouth.    . irbesartan (AVAPRO) 300 MG tablet TAKE 1 TABLET BY MOUTH EVERY MORNING 90 tablet 1  . L-FORMULA LYSINE HCL PO Take  by mouth.    . Multiple Vitamins-Minerals (MULTIVITAMIN ADULTS 50+ PO) Take 1 capsule by mouth daily.    . pantoprazole (PROTONIX) 40 MG tablet Take 1 tablet (40 mg total) by mouth daily. 45 tablet 0  . Polyvinyl Alcohol-Povidone (REFRESH OP) Place 2 drops into both eyes as needed (for dry eyes).    . tadalafil (CIALIS)  20 MG tablet Take 1 tablet (20 mg total) by mouth daily as needed for erectile dysfunction. 10 tablet 11  . warfarin (COUMADIN) 5 MG tablet TAKE AS DIRECTED BY COUMADIN CLINIC (Patient taking differently: Take 5-7.5 mg by mouth See admin instructions. Take 7.5 mg by mouth on Monday and Friday. Take 5 mg by mouth on all other days.) 50 tablet 1  . flecainide (TAMBOCOR) 100 MG tablet Take 1 tablet (100 mg total) by mouth 2 (two) times daily. 60 tablet 3   No current facility-administered medications for this encounter.     Allergies  Allergen Reactions  . Ibuprofen Other (See Comments)    Rapid heart beat    Social History   Social History  . Marital status: Married    Spouse name: N/A  . Number of children: 2  . Years of education: N/A   Occupational History  . [employer]   Social History Main Topics  . Smoking status: Former Smoker    Packs/day: 0.10    Years: 15.00    Types: Cigarettes    Quit date: 12/17/2010  . Smokeless tobacco: Never Used  . Alcohol use Yes     Comment: "1 glass of wine/month maybe" (10/31/2016)  . Drug use: No  . Sexual activity: Not Currently   Other Topics Concern  . Not on file   Social History Narrative   Pt lives in [place] with spouse. 2 grown children.   Works at [employer] in [place].    Family History  Problem Relation Age of Onset  . Stroke Mother   . Heart disease Sister   . Thyroid disease Sister   . Sleep apnea Sister   . Other Father        deceased stomach hemorrhage  . Anesthesia problems Neg Hx     ROS- All systems are reviewed and negative except as per the HPI above  Physical Exam: Vitals:   12/12/16 1147  BP: 128/84  Pulse: 89  Weight: 185 lb (83.9 kg)  Height: 6' (1.829 m)   Wt Readings from Last 3 Encounters:  12/12/16 185 lb (83.9 kg)  12/09/16 183 lb 12.8 oz (83.4 kg)  12/02/16 179 lb (81.2 kg)    Labs: Lab Results  Component Value Date   NA 136 11/27/2016   K 4.7  11/27/2016   CL 107 11/27/2016   CO2 21 (L) 11/27/2016   GLUCOSE 113 (H) 11/27/2016   BUN 18 11/27/2016   CREATININE 1.13 11/27/2016   CALCIUM 9.4 11/27/2016   MG 2.0 11/13/2011   Lab Results  Component Value Date   INR 2.43 12/09/2016   Lab Results  Component Value Date   CHOL 159 08/09/2016   HDL 55.00 08/09/2016   LDLCALC 75 08/09/2016   TRIG 147.0 08/09/2016     GEN- The patient is well appearing, alert and oriented x 3 today.   Head- normocephalic, atraumatic Eyes-  Sclera clear, conjunctiva pink Ears- hearing intact Oropharynx- clear Neck- supple, no JVP Lymph- no cervical lymphadenopathy Lungs- Clear to ausculation bilaterally, normal work of breathing Heart- irregular rate and rhythm, no murmurs, rubs  or gallops, PMI not laterally displaced GI- soft, NT, ND, + BS Extremities- no clubbing, cyanosis, or edema MS- no significant deformity or atrophy Skin- no rash or lesion Psych- euthymic mood, full affect Neuro- strength and sensation are intact  EKG-afib  at 89 bpm, qrs int 82 ms, qtc 452 ms Epic records reviewed  Assessment and Plan: 1.  Symptomatic  persisitent afib S/p ablation and recent cardioversion with ERAF  INR 7/9 was therapeutic Restart flecainide 100 mg bid Continue daily cardizem 120 mg daily  F/u on Monday for  EKG and INR  Butch Penny C. Jaree Trinka, [facility] [address] [place], [place] [address] [phone]

## 2016-12-13 ENCOUNTER — Other Ambulatory Visit: Payer: Self-pay | Admitting: Internal Medicine

## 2016-12-16 ENCOUNTER — Encounter (HOSPITAL_COMMUNITY): Payer: Self-pay | Admitting: Nurse Practitioner

## 2016-12-16 ENCOUNTER — Ambulatory Visit (HOSPITAL_COMMUNITY)
Admission: RE | Admit: 2016-12-16 | Discharge: 2016-12-16 | Disposition: A | Discharge status: Home / Self Care | Payer: BLUE CROSS/BLUE SHIELD | Source: Ambulatory Visit | Attending: Nurse Practitioner | Admitting: Nurse Practitioner

## 2016-12-16 ENCOUNTER — Ambulatory Visit (INDEPENDENT_AMBULATORY_CARE_PROVIDER_SITE_OTHER): Payer: BLUE CROSS/BLUE SHIELD | Admitting: Pharmacist

## 2016-12-16 DIAGNOSIS — I4819 Other persistent atrial fibrillation: Secondary | ICD-10-CM

## 2016-12-16 DIAGNOSIS — I481 Persistent atrial fibrillation: Secondary | ICD-10-CM | POA: Diagnosis not present

## 2016-12-16 DIAGNOSIS — Z5181 Encounter for therapeutic drug level monitoring: Secondary | ICD-10-CM

## 2016-12-16 LAB — PROTIME-INR
INR: 3.52
PROTHROMBIN TIME: 36.1 s — AB (ref 11.4–15.2)

## 2016-12-16 NOTE — Progress Notes (Addendum)
Pt in for repeat EKG and INR.  EKG to be reviewed by Roderic Palau, NP  Pt had flecainide 100 mg bid restarted for persistent afib s/p ablation. He remains in rate controlled afib, intervals unchanged INR drawn. He will return on Friday and if still in afib will plan on scheduling cardioversion.

## 2016-12-20 ENCOUNTER — Ambulatory Visit (HOSPITAL_COMMUNITY)
Admission: RE | Admit: 2016-12-20 | Discharge: 2016-12-20 | Disposition: A | Discharge status: Home / Self Care | Payer: BLUE CROSS/BLUE SHIELD | Source: Ambulatory Visit | Attending: Nurse Practitioner | Admitting: Nurse Practitioner

## 2016-12-20 DIAGNOSIS — I4581 Long QT syndrome: Secondary | ICD-10-CM | POA: Insufficient documentation

## 2016-12-20 DIAGNOSIS — Z950 Presence of cardiac pacemaker: Secondary | ICD-10-CM | POA: Insufficient documentation

## 2016-12-20 DIAGNOSIS — I4891 Unspecified atrial fibrillation: Secondary | ICD-10-CM | POA: Diagnosis not present

## 2016-12-20 DIAGNOSIS — Z9889 Other specified postprocedural states: Secondary | ICD-10-CM | POA: Diagnosis not present

## 2016-12-20 NOTE — Progress Notes (Addendum)
Pt in for repeat EKG to see if converted.  EKG to be reviewed by Roderic Palau, NP  Pt was started back on flecainide 100 mg bid for refractory afib s/p ablation and ERAF after ablation.  He was in afib Monday when he came in for EKG but today EKG shows junctional rhythm with PC's at 58 bpm. Qtc 488 ms. Ekg reviewed/discussed with Dr. Rayann Heman and will continue flecainide at current dose and cardizem 120 mg daily. He will call the office if he notices heart rates in the low 50's and or he becomes lightheaded/dizzy. Otherwise, will see back for EKG next Friday. Continue warfarin.

## 2016-12-23 ENCOUNTER — Ambulatory Visit (INDEPENDENT_AMBULATORY_CARE_PROVIDER_SITE_OTHER): Payer: BLUE CROSS/BLUE SHIELD | Admitting: *Deleted

## 2016-12-23 DIAGNOSIS — Z5181 Encounter for therapeutic drug level monitoring: Secondary | ICD-10-CM | POA: Diagnosis not present

## 2016-12-23 DIAGNOSIS — I4819 Other persistent atrial fibrillation: Secondary | ICD-10-CM

## 2016-12-23 DIAGNOSIS — I481 Persistent atrial fibrillation: Secondary | ICD-10-CM

## 2016-12-23 DIAGNOSIS — I4891 Unspecified atrial fibrillation: Secondary | ICD-10-CM | POA: Diagnosis not present

## 2016-12-23 LAB — POCT INR: INR: 2.6

## 2016-12-27 ENCOUNTER — Encounter (HOSPITAL_COMMUNITY): Payer: Self-pay | Admitting: Nurse Practitioner

## 2016-12-30 ENCOUNTER — Ambulatory Visit (HOSPITAL_COMMUNITY)
Admission: RE | Admit: 2016-12-30 | Discharge: 2016-12-30 | Disposition: A | Discharge status: Home / Self Care | Payer: BLUE CROSS/BLUE SHIELD | Source: Ambulatory Visit | Attending: Nurse Practitioner | Admitting: Nurse Practitioner

## 2016-12-30 DIAGNOSIS — I4891 Unspecified atrial fibrillation: Secondary | ICD-10-CM | POA: Diagnosis not present

## 2016-12-30 DIAGNOSIS — R001 Bradycardia, unspecified: Secondary | ICD-10-CM | POA: Insufficient documentation

## 2016-12-30 NOTE — Progress Notes (Addendum)
Pt in for repeat EKG.  To be reviewed by Roderic Palau, NP   Pt had been placed back on flecainide for afib s/p ablation. Last week he was started back on flecainide 100 mg bid and ekg showed a junctional rhythm. Today he has sinus brady at 49 bpm. He is also on 120 mg Cardizem daily. Intervals ok.  He feels well with brady. No issues with lightheadedness. No change. F/u with Dr. Rayann Heman as planned.

## 2017-01-02 ENCOUNTER — Other Ambulatory Visit: Payer: Self-pay | Admitting: Internal Medicine

## 2017-01-06 ENCOUNTER — Ambulatory Visit (INDEPENDENT_AMBULATORY_CARE_PROVIDER_SITE_OTHER): Payer: BLUE CROSS/BLUE SHIELD | Admitting: *Deleted

## 2017-01-06 DIAGNOSIS — I4891 Unspecified atrial fibrillation: Secondary | ICD-10-CM | POA: Diagnosis not present

## 2017-01-06 DIAGNOSIS — Z5181 Encounter for therapeutic drug level monitoring: Secondary | ICD-10-CM | POA: Diagnosis not present

## 2017-01-06 DIAGNOSIS — I481 Persistent atrial fibrillation: Secondary | ICD-10-CM | POA: Diagnosis not present

## 2017-01-06 DIAGNOSIS — I4819 Other persistent atrial fibrillation: Secondary | ICD-10-CM

## 2017-01-06 LAB — POCT INR: INR: 3.9

## 2017-01-14 ENCOUNTER — Other Ambulatory Visit: Payer: Self-pay | Admitting: Internal Medicine

## 2017-01-18 ENCOUNTER — Other Ambulatory Visit: Payer: Self-pay | Admitting: Internal Medicine

## 2017-01-21 ENCOUNTER — Ambulatory Visit (INDEPENDENT_AMBULATORY_CARE_PROVIDER_SITE_OTHER): Payer: BLUE CROSS/BLUE SHIELD

## 2017-01-21 ENCOUNTER — Encounter (INDEPENDENT_AMBULATORY_CARE_PROVIDER_SITE_OTHER): Payer: Self-pay

## 2017-01-21 DIAGNOSIS — I481 Persistent atrial fibrillation: Secondary | ICD-10-CM

## 2017-01-21 DIAGNOSIS — Z5181 Encounter for therapeutic drug level monitoring: Secondary | ICD-10-CM | POA: Diagnosis not present

## 2017-01-21 DIAGNOSIS — I4891 Unspecified atrial fibrillation: Secondary | ICD-10-CM | POA: Diagnosis not present

## 2017-01-21 DIAGNOSIS — I4819 Other persistent atrial fibrillation: Secondary | ICD-10-CM

## 2017-01-21 LAB — POCT INR: INR: 4.2

## 2017-02-05 ENCOUNTER — Ambulatory Visit (INDEPENDENT_AMBULATORY_CARE_PROVIDER_SITE_OTHER): Payer: BLUE CROSS/BLUE SHIELD | Admitting: Internal Medicine

## 2017-02-05 ENCOUNTER — Ambulatory Visit (INDEPENDENT_AMBULATORY_CARE_PROVIDER_SITE_OTHER): Payer: BLUE CROSS/BLUE SHIELD

## 2017-02-05 ENCOUNTER — Encounter: Payer: Self-pay | Admitting: Internal Medicine

## 2017-02-05 VITALS — BP 111/66 | HR 50 | Ht 72.0 in | Wt 195.8 lb

## 2017-02-05 DIAGNOSIS — I4891 Unspecified atrial fibrillation: Secondary | ICD-10-CM | POA: Diagnosis not present

## 2017-02-05 DIAGNOSIS — I481 Persistent atrial fibrillation: Secondary | ICD-10-CM

## 2017-02-05 DIAGNOSIS — I4819 Other persistent atrial fibrillation: Secondary | ICD-10-CM

## 2017-02-05 DIAGNOSIS — Z5181 Encounter for therapeutic drug level monitoring: Secondary | ICD-10-CM

## 2017-02-05 DIAGNOSIS — I10 Essential (primary) hypertension: Secondary | ICD-10-CM | POA: Diagnosis not present

## 2017-02-05 DIAGNOSIS — I480 Paroxysmal atrial fibrillation: Secondary | ICD-10-CM | POA: Diagnosis not present

## 2017-02-05 LAB — POCT INR: INR: 2.5

## 2017-02-05 NOTE — Patient Instructions (Addendum)
Medication Instructions:  Your physician recommends that you continue on your current medications as directed. Please refer to the Current Medication list given to you today.   Labwork: None ordered   Testing/Procedures: None ordered   Follow-Up: Your physician recommends that you schedule a follow-up appointment in: 3 months with Dr Rayann Heman  Thank you for choosing [facility]!!     Janan Halter, RN [phone]

## 2017-02-05 NOTE — Progress Notes (Signed)
PCP: Biagio Borg, MD   [name] [name] is a 56 y.o. male who presents today for routine electrophysiology followup.  Since his recent afib ablation, the patient reports doing very well.  He did have ERAF early on, however he has had no symptoms of afib over the past month.  he denies procedure related complications and is pleased with the results of the procedure.  Today, he denies symptoms of palpitations, chest pain, shortness of breath,  lower extremity edema, dizziness, presyncope, or syncope.  The patient is otherwise without complaint today.   Past Medical History:  Diagnosis Date  . Arthritis    "maybe in my hands" (10/31/2016)  . Atrial flutter ([place])   . CHEST PAIN-PRECORDIAL 05/25/2009  . ERECTILE DYSFUNCTION, ORGANIC 01/16/2010  . GERD (gastroesophageal reflux disease)   . HYPERLIPIDEMIA 12/15/2007  . HYPERTENSION 12/15/2007  . HYPOKALEMIA 12/15/2007  . Internal hemorrhoids   . Paroxysmal atrial fibrillation ([place]) 12/15/2007  . POSTURAL LIGHTHEADEDNESS 07/06/2008   Past Surgical History:  Procedure Laterality Date  . ATRIAL FIBRILLATION ABLATION N/A 08/13/2011   Procedure: ATRIAL FIBRILLATION ABLATION;  Surgeon: Thompson Grayer, MD;  Location[facility] C[facility] LAB;  Service: Cardiovascular;  Laterality: N/A;  . ATRIAL FIBRILLATION ABLATION  10/31/2016  . ATRIAL FIBRILLATION ABLATION N/A 10/31/2016   Procedure: Atrial Fibrillation Ablation;  Surgeon: Thompson Grayer, MD;  Location: [place] CV LAB;  Service: Cardiovascular;  Laterality: N/A;  . ATRIAL FLUTTER ABLATION N/A 12/27/2011   CTI repeat ablation by Dr Rayann Heman  . CARDIOVERSION  11/21/2011   Procedure: CARDIOVERSION;  Surgeon: Larey Dresser, MD;  Location: [place];  Service: Cardiovascular;  Laterality: N/A;  . CARDIOVERSION N/A 12/02/2016   Procedure: CARDIOVERSION;  Surgeon: [name], MD;  Location: [place];  Service: Cardiovascular;  Laterality: N/A;  . CHALAZION EXCISION Right   . ELBOW SURGERY Right early 1980s   bone spur/chip; "opened me up"  . INGUINAL HERNIA REPAIR Right 11/2006  . TEE WITHOUT CARDIOVERSION  08/12/2011   Procedure: TRANSESOPHAGEAL ECHOCARDIOGRAM (TEE);  Surgeon: Fay Records, MD;  Location: [facility] ENDOSCOPY;  Service: Cardiovascular;  Laterality: N/A;  . TEE WITHOUT CARDIOVERSION N/A 10/30/2016   Procedure: TRANSESOPHAGEAL ECHOCARDIOGRAM (TEE);  Surgeon: Josue Hector, MD;  Location: [facility] ENDOSCOPY;  Service: Cardiovascular;  Laterality: N/A;  . TEE WITHOUT CARDIOVERSION N/A 12/02/2016   Procedure: TRANSESOPHAGEAL ECHOCARDIOGRAM (TEE);  Surgeon: [name], MD;  Location: [facility] ENDOSCOPY;  Service: Cardiovascular;  Laterality: N/A;    ROS- all systems are personally reviewed and negatives except as per HPI above  Current Outpatient Prescriptions  Medication Sig Dispense Refill  . amLODipine (NORVASC) 5 MG tablet Take 0.5 tablets (2.5 mg total) by mouth daily. 90 tablet 3  . atorvastatin (LIPITOR) 20 MG tablet Take 1 tablet (20 mg total) by mouth daily. 90 tablet 3  . diltiazem (CARDIZEM CD) 120 MG 24 hr capsule Take 1 capsule (120 mg total) by mouth daily. 30 capsule 11  . diltiazem (CARDIZEM) 30 MG tablet Take 30 mg by mouth as needed.    . doxazosin (CARDURA) 4 MG tablet Take 1 tablet (4 mg total) by mouth at bedtime. 90 tablet 2  . flecainide (TAMBOCOR) 100 MG tablet Take 1 tablet (100 mg total) by mouth 2 (two) times daily. 60 tablet 3  . Glutamine 500 MG TABS Take by mouth.    . irbesartan (AVAPRO) 300 MG tablet TAKE 1 TABLET BY MOUTH EVERY MORNING 90 tablet 1  . L-FORMULA LYSINE HCL PO  Take by mouth.    . Multiple Vitamins-Minerals (MULTIVITAMIN ADULTS 50+ PO) Take 1 capsule by mouth daily.    . tadalafil (CIALIS) 20 MG tablet Take 1 tablet (20 mg total) by mouth daily as needed for erectile dysfunction. 10 tablet 11  . warfarin (COUMADIN) 5 MG tablet Take 1-1.5 tablets by mouth daily as directed by coumadin clinic 50 tablet 2   No current facility-administered medications for  this visit.     Physical Exam: Vitals:   02/05/17 1607  BP: 111/66  Pulse: (!) 50  Weight: 195 lb 12.8 oz (88.8 kg)  Height: 6' (1.829 m)    GEN- The patient is well appearing, alert and oriented x 3 today.   Head- normocephalic, atraumatic Eyes-  Sclera clear, conjunctiva pink Ears- hearing intact Oropharynx- clear Lungs- Clear to ausculation bilaterally, normal work of breathing Heart- Regular rate and rhythm, no murmurs, rubs or gallops, PMI not laterally displaced GI- soft, NT, ND, + BS Extremities- no clubbing, cyanosis, or edema  EKG tracing ordered today is personally reviewed and shows sinus bradycardia 50 bpm with PACs, QTc 446 msec  Assessment and Plan:  1. Persistent atrial fibrillation Doing well s/p ablation chads2vasc score is 1.  Continue on coumadin for now Consider stopping flecainide, diltiazem upon return  2. HTN Stable No change required today   Return to see me in 3 months  Thompson Grayer MD, [facility] 02/05/2017 10:48 PM

## 2017-02-26 ENCOUNTER — Ambulatory Visit (INDEPENDENT_AMBULATORY_CARE_PROVIDER_SITE_OTHER): Payer: BLUE CROSS/BLUE SHIELD | Admitting: *Deleted

## 2017-02-26 DIAGNOSIS — I481 Persistent atrial fibrillation: Secondary | ICD-10-CM

## 2017-02-26 DIAGNOSIS — I4891 Unspecified atrial fibrillation: Secondary | ICD-10-CM

## 2017-02-26 DIAGNOSIS — Z5181 Encounter for therapeutic drug level monitoring: Secondary | ICD-10-CM | POA: Diagnosis not present

## 2017-02-26 DIAGNOSIS — I4819 Other persistent atrial fibrillation: Secondary | ICD-10-CM

## 2017-02-26 LAB — POCT INR: INR: 1.9

## 2017-03-03 ENCOUNTER — Telehealth: Payer: Self-pay | Admitting: *Deleted

## 2017-03-03 NOTE — Telephone Encounter (Signed)
Pt states that he is going to dentist to have extraction of 1 tooth today. Instructed that we do no usually hold coumadin for 1 extraction but be sure the dentist knows he is on coumadin and he states understanding

## 2017-03-21 ENCOUNTER — Other Ambulatory Visit (HOSPITAL_COMMUNITY): Payer: Self-pay | Admitting: Nurse Practitioner

## 2017-03-25 ENCOUNTER — Ambulatory Visit (INDEPENDENT_AMBULATORY_CARE_PROVIDER_SITE_OTHER): Payer: BLUE CROSS/BLUE SHIELD | Admitting: *Deleted

## 2017-03-25 DIAGNOSIS — Z5181 Encounter for therapeutic drug level monitoring: Secondary | ICD-10-CM | POA: Diagnosis not present

## 2017-03-25 DIAGNOSIS — I481 Persistent atrial fibrillation: Secondary | ICD-10-CM | POA: Diagnosis not present

## 2017-03-25 DIAGNOSIS — I4891 Unspecified atrial fibrillation: Secondary | ICD-10-CM | POA: Diagnosis not present

## 2017-03-25 DIAGNOSIS — I4819 Other persistent atrial fibrillation: Secondary | ICD-10-CM

## 2017-03-25 LAB — POCT INR: INR: 2.6

## 2017-04-10 ENCOUNTER — Other Ambulatory Visit: Payer: Self-pay | Admitting: *Deleted

## 2017-04-10 NOTE — Telephone Encounter (Signed)
Pharmacy requests a ninety day supply. 

## 2017-04-11 ENCOUNTER — Other Ambulatory Visit: Payer: Self-pay | Admitting: *Deleted

## 2017-04-11 MED ORDER — WARFARIN SODIUM 5 MG PO TABS: ORAL_TABLET | ORAL | 3 refills | Status: DC

## 2017-04-11 MED ORDER — WARFARIN SODIUM 5 MG PO TABS: ORAL_TABLET | ORAL | 0 refills | Status: DC

## 2017-04-22 ENCOUNTER — Ambulatory Visit (INDEPENDENT_AMBULATORY_CARE_PROVIDER_SITE_OTHER): Payer: BLUE CROSS/BLUE SHIELD | Admitting: *Deleted

## 2017-04-22 DIAGNOSIS — I481 Persistent atrial fibrillation: Secondary | ICD-10-CM

## 2017-04-22 DIAGNOSIS — Z5181 Encounter for therapeutic drug level monitoring: Secondary | ICD-10-CM | POA: Diagnosis not present

## 2017-04-22 DIAGNOSIS — I4891 Unspecified atrial fibrillation: Secondary | ICD-10-CM | POA: Diagnosis not present

## 2017-04-22 DIAGNOSIS — I4819 Other persistent atrial fibrillation: Secondary | ICD-10-CM

## 2017-04-22 LAB — POCT INR: INR: 2.9

## 2017-04-22 NOTE — Patient Instructions (Signed)
Continue taking  7.5mg  daily except 5mg  on Mondays and Fridays.  Recheck INR in 4 weeks. Call us with any medication changes # [phone]

## 2017-05-19 ENCOUNTER — Ambulatory Visit: Payer: BLUE CROSS/BLUE SHIELD | Admitting: Internal Medicine

## 2017-06-02 ENCOUNTER — Ambulatory Visit (INDEPENDENT_AMBULATORY_CARE_PROVIDER_SITE_OTHER): Payer: BLUE CROSS/BLUE SHIELD | Admitting: *Deleted

## 2017-06-02 DIAGNOSIS — I4891 Unspecified atrial fibrillation: Secondary | ICD-10-CM | POA: Diagnosis not present

## 2017-06-02 DIAGNOSIS — I4819 Other persistent atrial fibrillation: Secondary | ICD-10-CM

## 2017-06-02 DIAGNOSIS — Z5181 Encounter for therapeutic drug level monitoring: Secondary | ICD-10-CM

## 2017-06-02 DIAGNOSIS — I481 Persistent atrial fibrillation: Secondary | ICD-10-CM | POA: Diagnosis not present

## 2017-06-02 LAB — POCT INR: INR: 2.7

## 2017-06-02 NOTE — Patient Instructions (Signed)
Description   Continue taking 7.5mg daily except 5mg on Mondays and Fridays.  Recheck INR in 6 weeks.  Call us with any medication changes # [phone]     

## 2017-06-23 ENCOUNTER — Ambulatory Visit (INDEPENDENT_AMBULATORY_CARE_PROVIDER_SITE_OTHER): Payer: BLUE CROSS/BLUE SHIELD | Admitting: Internal Medicine

## 2017-06-23 ENCOUNTER — Encounter: Payer: Self-pay | Admitting: Internal Medicine

## 2017-06-23 VITALS — BP 124/82 | HR 127 | Ht 72.0 in | Wt 204.0 lb

## 2017-06-23 DIAGNOSIS — I481 Persistent atrial fibrillation: Secondary | ICD-10-CM | POA: Diagnosis not present

## 2017-06-23 DIAGNOSIS — I4819 Other persistent atrial fibrillation: Secondary | ICD-10-CM

## 2017-06-23 MED ORDER — DILTIAZEM HCL ER COATED BEADS 240 MG PO CP24: 240.0000 mg | ORAL_CAPSULE | Freq: Every day | ORAL | 11 refills | Status: DC

## 2017-06-23 NOTE — Patient Instructions (Addendum)
Medication Instructions:  Your physician has recommended you make the following change in your medication 1) Stop Flecainide 2) Increase Diltiazem to 240 mg twice daily   Labwork: None ordered   Testing/Procedures: Tikosyn Load next wek 07/03/17  Follow-Up: Your physician recommends that you schedule a follow-up appointment after [place], RN will call in regards to hospitalization   Any Other Special Instructions Will Be Listed Below (If Applicable).     If you need a refill on your cardiac medications before your next appointment, please call your pharmacy.

## 2017-06-23 NOTE — Progress Notes (Signed)
PCP: Biagio Borg, MD   Primary EP: Dr Hart Carwin is a 57 y.o. male who presents today for routine electrophysiology followup.  Since last being seen in our clinic, the patient reports doing reasonably well.  He has had recurrent tachypalpitations with fatigue for several weeks. Today, he denies symptoms of chest pain, shortness of breath,  lower extremity edema, dizziness, presyncope, or syncope.  The patient is otherwise without complaint today.   Past Medical History:  Diagnosis Date  . Arthritis    "maybe in my hands" (10/31/2016)  . Atrial flutter ([place])   . CHEST PAIN-PRECORDIAL 05/25/2009  . ERECTILE DYSFUNCTION, ORGANIC 01/16/2010  . GERD (gastroesophageal reflux disease)   . HYPERLIPIDEMIA 12/15/2007  . HYPERTENSION 12/15/2007  . HYPOKALEMIA 12/15/2007  . Internal hemorrhoids   . Paroxysmal atrial fibrillation ([place]) 12/15/2007  . POSTURAL LIGHTHEADEDNESS 07/06/2008   Past Surgical History:  Procedure Laterality Date  . ATRIAL FIBRILLATION ABLATION N/A 08/13/2011   Procedure: ATRIAL FIBRILLATION ABLATION;  Surgeon: Thompson Grayer, MD;  Location: [facility] CATH LAB;  Service: Cardiovascular;  Laterality: N/A;  . ATRIAL FIBRILLATION ABLATION  10/31/2016  . ATRIAL FIBRILLATION ABLATION N/A 10/31/2016   Procedure: Atrial Fibrillation Ablation;  Surgeon: Thompson Grayer, MD;  Location: [place] CV LAB;  Service: Cardiovascular;  Laterality: N/A;  . ATRIAL FLUTTER ABLATION N/A 12/27/2011   CTI repeat ablation by Dr Rayann Heman  . CARDIOVERSION  11/21/2011   Procedure: CARDIOVERSION;  Surgeon: Larey Dresser, MD;  Location: [place];  Service: Cardiovascular;  Laterality: N/A;  . CARDIOVERSION N/A 12/02/2016   Procedure: CARDIOVERSION;  Surgeon: [name], MD;  Location: [place];  Service: Cardiovascular;  Laterality: N/A;  . CHALAZION EXCISION Right   . ELBOW SURGERY Right early 1980s   bone spur/chip; "opened me up"  . INGUINAL HERNIA REPAIR Right 11/2006  . TEE WITHOUT  CARDIOVERSION  08/12/2011   Procedure: TRANSESOPHAGEAL ECHOCARDIOGRAM (TEE);  Surgeon: Fay Records, MD;  Location: [facility] ENDOSCOPY;  Service: Cardiovascular;  Laterality: N/A;  . TEE WITHOUT CARDIOVERSION N/A 10/30/2016   Procedure: TRANSESOPHAGEAL ECHOCARDIOGRAM (TEE);  Surgeon: Josue Hector, MD;  Location: [facility] ENDOSCOPY;  Service: Cardiovascular;  Laterality: N/A;  . TEE WITHOUT CARDIOVERSION N/A 12/02/2016   Procedure: TRANSESOPHAGEAL ECHOCARDIOGRAM (TEE);  Surgeon: [name], MD;  Location: [facility] ENDOSCOPY;  Service: Cardiovascular;  Laterality: N/A;    ROS- all systems are reviewed and negatives except as per HPI above  Current Outpatient Medications  Medication Sig Dispense Refill  . atorvastatin (LIPITOR) 20 MG tablet Take 1 tablet (20 mg total) by mouth daily. 90 tablet 3  . diltiazem (CARDIZEM CD) 120 MG 24 hr capsule Take 1 capsule (120 mg total) by mouth daily. 30 capsule 11  . doxazosin (CARDURA) 4 MG tablet Take 1 tablet (4 mg total) by mouth at bedtime. 90 tablet 2  . flecainide (TAMBOCOR) 100 MG tablet TAKE 1 TABLET BY MOUTH TWICE A DAY 60 tablet 3  . Glutamine 500 MG TABS Take by mouth.    . irbesartan (AVAPRO) 300 MG tablet TAKE 1 TABLET BY MOUTH EVERY MORNING 90 tablet 1  . L-FORMULA LYSINE HCL PO Take by mouth.    . Multiple Vitamins-Minerals (MULTIVITAMIN ADULTS 50+ PO) Take 1 capsule by mouth daily.    Marland Kitchen warfarin (COUMADIN) 5 MG tablet Take as directed by coumadin clinic 150 tablet 0  . amLODipine (NORVASC) 5 MG tablet Take 0.5 tablets (2.5 mg total) by mouth daily. 90 tablet 3  .  diltiazem (CARDIZEM) 30 MG tablet Take 30 mg by mouth as needed.    . tadalafil (CIALIS) 20 MG tablet Take 1 tablet (20 mg total) by mouth daily as needed for erectile dysfunction. (Patient not taking: Reported on 06/23/2017) 10 tablet 11   No current facility-administered medications for this visit.     Physical Exam: Vitals:   06/23/17 1618  BP: 124/82  Pulse: (!) 127  Weight: 218 lb  (98.9 kg)  Height: 6' (1.829 m)    GEN- The patient is well appearing, alert and oriented x 3 today.   Head- normocephalic, atraumatic Eyes-  Sclera clear, conjunctiva pink Ears- hearing intact Oropharynx- clear Lungs- Clear to ausculation bilaterally, normal work of breathing Heart- tachycardic irregular rhythm, no murmurs, rubs or gallops, PMI not laterally displaced GI- soft, NT, ND, + BS Extremities- no clubbing, cyanosis, or edema  EKG tracing ordered today is personally reviewed and shows atypical atrial flutter, V rate 127 bpm  Assessment and Plan:  1. Persistent afib/ atypical atrial flutter Now back in an atypical atrial flutter  He has symptoms of palpitations and fatigue He has failed medical therapy with flecainide. Stop flecainide today.  Increase diltiazem CD to 240mg  daily.  We discussed options of repeat ablation and tikosyn.  He understands risks of tikosyn including proarrhythmia and death.  He wishes to proceed.  We will therefore schedule admission for tikosyn load at the next available time. On coumadin.  Check INR today and on admission.  2. HTN. Stable No change required today  Very complicated patient.  At risk for decompensation/ CHF/ hospitalization.  A high level of decision making was required for this encounter.  Thompson Grayer MD, [facility] 06/23/2017 4:49 PM

## 2017-06-24 ENCOUNTER — Telehealth: Payer: Self-pay | Admitting: Pharmacist

## 2017-06-24 ENCOUNTER — Telehealth (HOSPITAL_COMMUNITY): Payer: Self-pay | Admitting: *Deleted

## 2017-06-24 NOTE — Telephone Encounter (Signed)
INR was checked by Janan Halter RN at 2.7 on 06/23/17. Per Dr. Rayann Heman pt does not need weekly INRs for tikosyn load - check INR on 1/21 and then morning of admit.

## 2017-06-24 NOTE — Telephone Encounter (Signed)
Medication list reviewed in anticipation of upcoming Tikosyn initiation. Patient is not taking any contraindicated or QTc prolonging medications. His flecainide was stopped at office visit on 06/23/17.  Patient is anticoagulated on warfarin. Per Dr Rayann Heman, pt ok for admission without 4 weekly therapeutic INR checks. His INR was 2.7 on 06/23/17 and per Dr Rayann Heman will next be checked the morning of admission. Would clarify whether or not he would like TEE since weekly INRs are not being checked.  Patient will need to be counseled to avoid use of Benadryl while on Tikosyn and in the 2-3 days prior to Tikosyn initiation.

## 2017-07-03 ENCOUNTER — Ambulatory Visit (HOSPITAL_COMMUNITY)
Admission: RE | Admit: 2017-07-03 | Discharge: 2017-07-03 | Disposition: A | Discharge status: Home / Self Care | Payer: BLUE CROSS/BLUE SHIELD | Source: Ambulatory Visit | Attending: Nurse Practitioner | Admitting: Nurse Practitioner

## 2017-07-03 ENCOUNTER — Inpatient Hospital Stay (HOSPITAL_COMMUNITY)
Admission: AD | Admit: 2017-07-03 | Discharge: 2017-07-06 | DRG: 310 | Disposition: A | Discharge status: Home / Self Care | Payer: BLUE CROSS/BLUE SHIELD | Source: Ambulatory Visit | Attending: Internal Medicine | Admitting: Internal Medicine

## 2017-07-03 ENCOUNTER — Encounter (HOSPITAL_COMMUNITY): Payer: Self-pay | Admitting: General Practice

## 2017-07-03 ENCOUNTER — Other Ambulatory Visit: Payer: Self-pay

## 2017-07-03 ENCOUNTER — Encounter (HOSPITAL_COMMUNITY): Payer: Self-pay | Admitting: Nurse Practitioner

## 2017-07-03 VITALS — BP 118/72 | HR 84 | Ht 72.0 in | Wt 198.6 lb

## 2017-07-03 DIAGNOSIS — I4892 Unspecified atrial flutter: Secondary | ICD-10-CM | POA: Diagnosis not present

## 2017-07-03 DIAGNOSIS — I4819 Other persistent atrial fibrillation: Secondary | ICD-10-CM | POA: Diagnosis present

## 2017-07-03 DIAGNOSIS — I495 Sick sinus syndrome: Secondary | ICD-10-CM | POA: Diagnosis not present

## 2017-07-03 DIAGNOSIS — E785 Hyperlipidemia, unspecified: Secondary | ICD-10-CM | POA: Diagnosis not present

## 2017-07-03 DIAGNOSIS — Z79899 Other long term (current) drug therapy: Secondary | ICD-10-CM

## 2017-07-03 DIAGNOSIS — Z5181 Encounter for therapeutic drug level monitoring: Secondary | ICD-10-CM

## 2017-07-03 DIAGNOSIS — I481 Persistent atrial fibrillation: Principal | ICD-10-CM

## 2017-07-03 DIAGNOSIS — K219 Gastro-esophageal reflux disease without esophagitis: Secondary | ICD-10-CM | POA: Diagnosis present

## 2017-07-03 DIAGNOSIS — I10 Essential (primary) hypertension: Secondary | ICD-10-CM | POA: Diagnosis not present

## 2017-07-03 DIAGNOSIS — R001 Bradycardia, unspecified: Secondary | ICD-10-CM

## 2017-07-03 DIAGNOSIS — Z87891 Personal history of nicotine dependence: Secondary | ICD-10-CM

## 2017-07-03 DIAGNOSIS — I480 Paroxysmal atrial fibrillation: Secondary | ICD-10-CM | POA: Diagnosis not present

## 2017-07-03 HISTORY — DX: Encounter for therapeutic drug level monitoring: Z51.81

## 2017-07-03 HISTORY — DX: Other long term (current) drug therapy: Z79.899

## 2017-07-03 HISTORY — DX: Other persistent atrial fibrillation: I48.19

## 2017-07-03 HISTORY — DX: Bradycardia, unspecified: R00.1

## 2017-07-03 LAB — PROTIME-INR
INR: 2
Prothrombin Time: 22.5 seconds — ABNORMAL HIGH (ref 11.4–15.2)

## 2017-07-03 LAB — BASIC METABOLIC PANEL
Anion gap: 10 (ref 5–15)
BUN: 21 mg/dL — ABNORMAL HIGH (ref 6–20)
CHLORIDE: 106 mmol/L (ref 101–111)
CO2: 22 mmol/L (ref 22–32)
CREATININE: 1.14 mg/dL (ref 0.61–1.24)
Calcium: 9.3 mg/dL (ref 8.9–10.3)
GFR calc non Af Amer: >60 mL/min (ref >60)
Glucose, Bld: 106 mg/dL — ABNORMAL HIGH (ref 65–99)
POTASSIUM: 4 mmol/L (ref 3.5–5.1)
Sodium: 138 mmol/L (ref 135–145)

## 2017-07-03 LAB — MAGNESIUM: Magnesium: 2 mg/dL (ref 1.7–2.4)

## 2017-07-03 MED ORDER — IRBESARTAN 150 MG PO TABS
300.0000 mg | ORAL_TABLET | Freq: Every morning | ORAL | Status: DC
  Administered 2017-07-03 – 2017-07-05 (×3): 300 mg via ORAL
  Filled 2017-07-03 (×3): qty 2

## 2017-07-03 MED ORDER — DOXAZOSIN MESYLATE 4 MG PO TABS
4.0000 mg | ORAL_TABLET | Freq: Every day | ORAL | Status: DC
  Administered 2017-07-03 – 2017-07-05 (×3): 4 mg via ORAL
  Filled 2017-07-03 (×3): qty 1

## 2017-07-03 MED ORDER — AMLODIPINE BESYLATE 2.5 MG PO TABS
2.5000 mg | ORAL_TABLET | Freq: Every day | ORAL | Status: DC
  Administered 2017-07-03 – 2017-07-05 (×3): 2.5 mg via ORAL
  Filled 2017-07-03 (×3): qty 1

## 2017-07-03 MED ORDER — WARFARIN SODIUM 5 MG PO TABS: 5.0000 mg | ORAL_TABLET | Freq: Every day | ORAL | Status: DC

## 2017-07-03 MED ORDER — SODIUM CHLORIDE 0.9% FLUSH
3.0000 mL | Freq: Two times a day (BID) | INTRAVENOUS | Status: DC
  Administered 2017-07-03 – 2017-07-05 (×4): 3 mL via INTRAVENOUS

## 2017-07-03 MED ORDER — DOFETILIDE 500 MCG PO CAPS
500.0000 ug | ORAL_CAPSULE | Freq: Two times a day (BID) | ORAL | Status: DC
  Administered 2017-07-03 – 2017-07-05 (×5): 500 ug via ORAL
  Filled 2017-07-03 (×5): qty 1

## 2017-07-03 MED ORDER — DILTIAZEM HCL ER COATED BEADS 240 MG PO CP24: 240.0000 mg | ORAL_CAPSULE | Freq: Every day | ORAL | Status: DC

## 2017-07-03 MED ORDER — WARFARIN - PHARMACIST DOSING INPATIENT: Freq: Every day | Status: DC

## 2017-07-03 MED ORDER — SODIUM CHLORIDE 0.9% FLUSH: 3.0000 mL | INTRAVENOUS | Status: DC | PRN

## 2017-07-03 MED ORDER — SODIUM CHLORIDE 0.9 % IV SOLN: 250.0000 mL | INTRAVENOUS | Status: DC | PRN

## 2017-07-03 MED ORDER — WARFARIN SODIUM 10 MG PO TABS
10.0000 mg | ORAL_TABLET | Freq: Once | ORAL | Status: AC
  Administered 2017-07-03: 10 mg via ORAL
  Filled 2017-07-03: qty 1

## 2017-07-03 MED ORDER — ATORVASTATIN CALCIUM 20 MG PO TABS
20.0000 mg | ORAL_TABLET | Freq: Every day | ORAL | Status: DC
Start: 2017-07-03 — End: 2017-07-06
  Administered 2017-07-03 – 2017-07-05 (×3): 20 mg via ORAL
  Filled 2017-07-03 (×3): qty 1

## 2017-07-03 NOTE — H&P (Signed)
[name] [name] is a 57 y.o. male with a h/o long standing h/o afib/flutter,currently persistent, in the past failing flecainide and ablation x 3, first for flutter and the last two for fibrillation. He has been off flecainide since 1/21. He is in the afib clinic for admission for Tikosyn. No benadryl use and per Dr. Rayann Heman he is OK without weekly INR's or TEE prior to admission. He was 2.7 on 06/23/17 and INR is pending today. He is aware of the cost of tikoyn and wants to proceed.  Today, he denies symptoms of palpitations, chest pain, shortness of breath, orthopnea, PND, lower extremity edema, dizziness, presyncope, syncope, or neurologic sequela. The patient is tolerating medications without difficulties and is otherwise without complaint today.       Past Medical History:  Diagnosis Date  . Arthritis    "maybe in my hands" (10/31/2016)  . Atrial flutter ([place])   . CHEST PAIN-PRECORDIAL 05/25/2009  . ERECTILE DYSFUNCTION, ORGANIC 01/16/2010  . GERD (gastroesophageal reflux disease)   . HYPERLIPIDEMIA 12/15/2007  . HYPERTENSION 12/15/2007  . HYPOKALEMIA 12/15/2007  . Internal hemorrhoids   . Paroxysmal atrial fibrillation ([place]) 12/15/2007  . POSTURAL LIGHTHEADEDNESS 07/06/2008        Past Surgical History:  Procedure Laterality Date  . ATRIAL FIBRILLATION ABLATION N/A 08/13/2011   Procedure: ATRIAL FIBRILLATION ABLATION;  Surgeon: Thompson Grayer, MD;  Location: [facility] CATH LAB;  Service: Cardiovascular;  Laterality: N/A;  . ATRIAL FIBRILLATION ABLATION  10/31/2016  . ATRIAL FIBRILLATION ABLATION N/A 10/31/2016   Procedure: Atrial Fibrillation Ablation;  Surgeon: Thompson Grayer, MD;  Location: [place] CV LAB;  Service: Cardiovascular;  Laterality: N/A;  . ATRIAL FLUTTER ABLATION N/A 12/27/2011   CTI repeat ablation by Dr Rayann Heman  . CARDIOVERSION  11/21/2011   Procedure: CARDIOVERSION;  Surgeon: Larey Dresser, MD;  Location: [place];  Service: Cardiovascular;  Laterality: N/A;  .  CARDIOVERSION N/A 12/02/2016   Procedure: CARDIOVERSION;  Surgeon: [name], MD;  Location: [place];  Service: Cardiovascular;  Laterality: N/A;  . CHALAZION EXCISION Right   . ELBOW SURGERY Right early 1980s   bone spur/chip; "opened me up"  . INGUINAL HERNIA REPAIR Right 11/2006  . TEE WITHOUT CARDIOVERSION  08/12/2011   Procedure: TRANSESOPHAGEAL ECHOCARDIOGRAM (TEE);  Surgeon: Fay Records, MD;  Location: [facility] ENDOSCOPY;  Service: Cardiovascular;  Laterality: N/A;  . TEE WITHOUT CARDIOVERSION N/A 10/30/2016   Procedure: TRANSESOPHAGEAL ECHOCARDIOGRAM (TEE);  Surgeon: Josue Hector, MD;  Location: [facility] ENDOSCOPY;  Service: Cardiovascular;  Laterality: N/A;  . TEE WITHOUT CARDIOVERSION N/A 12/02/2016   Procedure: TRANSESOPHAGEAL ECHOCARDIOGRAM (TEE);  Surgeon: [name], MD;  Location: [facility] ENDOSCOPY;  Service: Cardiovascular;  Laterality: N/A;    No current outpatient medications on file.   No current facility-administered medications for this encounter.          Allergies  Allergen Reactions  . Ibuprofen Other (See Comments)    Rapid heart beat    Social History        Socioeconomic History  . Marital status: Married    Spouse name: Not on file  . Number of children: 2  . Years of education: Not on file  . Highest education level: Not on file  Social Needs  . Financial resource strain: Not on file  . Food insecurity - worry: Not on file  . Food insecurity - inability: Not on file  . Transportation needs - medical: Not on file  . Transportation needs - non-medical: Not  on file  Occupational History  . Occupation: SECURITY    Employer: [employer]  Tobacco Use  . Smoking status: Former Smoker    Packs/day: 0.10    Years: 15.00    Pack years: 1.50    Types: Cigarettes    Last attempt to quit: 12/17/2010    Years since quitting: 6.5  . Smokeless tobacco: Never Used  Substance and Sexual Activity  . Alcohol use: Yes     Comment: "1 glass of wine/month maybe" (10/31/2016)  . Drug use: No  . Sexual activity: Not Currently  Other Topics Concern  . Not on file  Social History Narrative   Pt lives in [place] with spouse. 2 grown children.   Works at [employer] in [place].         Family History  Problem Relation Age of Onset  . Stroke Mother   . Heart disease Sister   . Thyroid disease Sister   . Sleep apnea Sister   . Other Father        deceased stomach hemorrhage  . Anesthesia problems Neg Hx     ROS- All systems are reviewed and negative except as per the HPI above  Physical Exam:    Vitals:   07/03/17 1030  BP: 118/72  Pulse: 84  Weight: 198 lb 9.6 oz (90.1 kg)  Height: 6' (1.829 m)      Wt Readings from Last 3 Encounters:  07/03/17 198 lb 9.6 oz (90.1 kg)  06/23/17 204 lb (92.5 kg)  12/12/16 185 lb (83.9 kg)    Labs: RecentLabs       Lab Results  Component Value Date   NA 138 07/03/2017   K 4.0 07/03/2017   CL 106 07/03/2017   CO2 22 07/03/2017   GLUCOSE 106 (H) 07/03/2017   BUN 21 (H) 07/03/2017   CREATININE 1.14 07/03/2017   CALCIUM 9.3 07/03/2017   MG 2.0 07/03/2017     RecentLabs       Lab Results  Component Value Date   INR 2.00 07/03/2017     RecentLabs       Lab Results  Component Value Date   CHOL 159 08/09/2016   HDL 55.00 08/09/2016   LDLCALC 75 08/09/2016   TRIG 147.0 08/09/2016       GEN- The patient is well appearing, alert and oriented x 3 today.   Head- normocephalic, atraumatic Eyes-  Sclera clear, conjunctiva pink Ears- hearing intact Oropharynx- clear Neck- supple, no JVP Lymph- no cervical lymphadenopathy Lungs- Clear to ausculation bilaterally, normal work of breathing Heart- irregular rate and rhythm, no murmurs, rubs or gallops, PMI not laterally displaced GI- soft, NT, ND, + BS Extremities- no clubbing, cyanosis, or edema MS- no significant deformity or  atrophy Skin- no rash or lesion Psych- euthymic mood, full affect Neuro- strength and sensation are intact  EKG- Afib with qrs int 84 ms, qtc 460 ms Epic records reviewed    Assessment and Plan: 1. Persistent afib General precautions re Tikosyn reviewed Failed flecainide and has been off  >3 days Aware of cost of tikosyn  Per Dr. Rayann Heman ok without 4 weeks of therapeutic  INR'sTEE , today INR at 2.0 Has not used any benadryl PharmD reviewed drugs and no issues Bmet/mag today with K+ at 4.0 and mag at 2. with crcl cal at 90.82 so will be beginning dose will be 500 mcg bid Qtc at 460 ms, ok per Dr. Lawrence Marseilles C. Carroll, [facility]  [facility] [address] [place], [place] [address] [phone]   EP Attending  Patient seen and examined. Agree with above. The patient presents today for initiation of dofetilide for persistent atrial fib. His QTC is satisfactory and renal function and electrolytes are satisfactory. He is in NSR presently. Will continue dofetilide based on his renal function with plan to DC home in 3 days.   Mikle Bosworth.D.

## 2017-07-03 NOTE — Progress Notes (Signed)
Primary Care Physician: Biagio Borg, MD Referring Physician: Dr. Hart Carwin is a 57 y.o. male with a h/o long standing h/o afib/flutter,currently persistent, in the past failing flecainide and ablation x 3, first for flutter and the last two for fibrillation. He has been off flecainide since 1/21. He is in the afib clinic for admission for Tikosyn. No benadryl use and per Dr. Rayann Heman he is OK without weekly INR's or TEE prior to admission. He was 2.7 on 06/23/17 and INR is pending today. He is aware of the cost of tikoyn and wants to proceed.  Today, he denies symptoms of palpitations, chest pain, shortness of breath, orthopnea, PND, lower extremity edema, dizziness, presyncope, syncope, or neurologic sequela. The patient is tolerating medications without difficulties and is otherwise without complaint today.   Past Medical History:  Diagnosis Date  . Arthritis    "maybe in my hands" (10/31/2016)  . Atrial flutter ([place])   . CHEST PAIN-PRECORDIAL 05/25/2009  . ERECTILE DYSFUNCTION, ORGANIC 01/16/2010  . GERD (gastroesophageal reflux disease)   . HYPERLIPIDEMIA 12/15/2007  . HYPERTENSION 12/15/2007  . HYPOKALEMIA 12/15/2007  . Internal hemorrhoids   . Paroxysmal atrial fibrillation ([place]) 12/15/2007  . POSTURAL LIGHTHEADEDNESS 07/06/2008   Past Surgical History:  Procedure Laterality Date  . ATRIAL FIBRILLATION ABLATION N/A 08/13/2011   Procedure: ATRIAL FIBRILLATION ABLATION;  Surgeon: Thompson Grayer, MD;  Location: [facility] CATH LAB;  Service: Cardiovascular;  Laterality: N/A;  . ATRIAL FIBRILLATION ABLATION  10/31/2016  . ATRIAL FIBRILLATION ABLATION N/A 10/31/2016   Procedure: Atrial Fibrillation Ablation;  Surgeon: Thompson Grayer, MD;  Location: [place] CV LAB;  Service: Cardiovascular;  Laterality: N/A;  . ATRIAL FLUTTER ABLATION N/A 12/27/2011   CTI repeat ablation by Dr Rayann Heman  . CARDIOVERSION  11/21/2011   Procedure: CARDIOVERSION;  Surgeon: Larey Dresser, MD;  Location: [place];  Service: Cardiovascular;  Laterality: N/A;  . CARDIOVERSION N/A 12/02/2016   Procedure: CARDIOVERSION;  Surgeon: [name], MD;  Location: [place];  Service: Cardiovascular;  Laterality: N/A;  . CHALAZION EXCISION Right   . ELBOW SURGERY Right early 1980s   bone spur/chip; "opened me up"  . INGUINAL HERNIA REPAIR Right 11/2006  . TEE WITHOUT CARDIOVERSION  08/12/2011   Procedure: TRANSESOPHAGEAL ECHOCARDIOGRAM (TEE);  Surgeon: Fay Records, MD;  Location: [facility] ENDOSCOPY;  Service: Cardiovascular;  Laterality: N/A;  . TEE WITHOUT CARDIOVERSION N/A 10/30/2016   Procedure: TRANSESOPHAGEAL ECHOCARDIOGRAM (TEE);  Surgeon: Josue Hector, MD;  Location: [facility] ENDOSCOPY;  Service: Cardiovascular;  Laterality: N/A;  . TEE WITHOUT CARDIOVERSION N/A 12/02/2016   Procedure: TRANSESOPHAGEAL ECHOCARDIOGRAM (TEE);  Surgeon: [name], MD;  Location: [facility] ENDOSCOPY;  Service: Cardiovascular;  Laterality: N/A;    No current outpatient medications on file.   No current facility-administered medications for this encounter.     Allergies  Allergen Reactions  . Ibuprofen Other (See Comments)    Rapid heart beat    Social History   Socioeconomic History  . Marital status: Married    Spouse name: Not on file  . Number of children: 2  . Years of education: Not on file  . Highest education level: Not on file  Social Needs  . Financial resource strain: Not on file  . Food insecurity - worry: Not on file  . Food insecurity - inability: Not on file  . Transportation needs - medical: Not on file  . Transportation needs - non-medical: Not on file  Occupational History  .  Occupation: SECURITY    Employer: [employer]  Tobacco Use  . Smoking status: Former Smoker    Packs/day: 0.10    Years: 15.00    Pack years: 1.50    Types: Cigarettes    Last attempt to quit: 12/17/2010    Years since quitting: 6.5  . Smokeless tobacco: Never Used  Substance and Sexual Activity  . Alcohol  use: Yes    Comment: "1 glass of wine/month maybe" (10/31/2016)  . Drug use: No  . Sexual activity: Not Currently  Other Topics Concern  . Not on file  Social History Narrative   Pt lives in [place] with spouse. 2 grown children.   Works at [employer] in [place].    Family History  Problem Relation Age of Onset  . Stroke Mother   . Heart disease Sister   . Thyroid disease Sister   . Sleep apnea Sister   . Other Father        deceased stomach hemorrhage  . Anesthesia problems Neg Hx     ROS- All systems are reviewed and negative except as per the HPI above  Physical Exam: Vitals:   07/03/17 1030  BP: 118/72  Pulse: 84  Weight: 198 lb 9.6 oz (90.1 kg)  Height: 6' (1.829 m)   Wt Readings from Last 3 Encounters:  07/03/17 198 lb 9.6 oz (90.1 kg)  06/23/17 204 lb (92.5 kg)  12/12/16 185 lb (83.9 kg)    Labs: Lab Results  Component Value Date   NA 138 07/03/2017   K 4.0 07/03/2017   CL 106 07/03/2017   CO2 22 07/03/2017   GLUCOSE 106 (H) 07/03/2017   BUN 21 (H) 07/03/2017   CREATININE 1.14 07/03/2017   CALCIUM 9.3 07/03/2017   MG 2.0 07/03/2017   Lab Results  Component Value Date   INR 2.00 07/03/2017   Lab Results  Component Value Date   CHOL 159 08/09/2016   HDL 55.00 08/09/2016   LDLCALC 75 08/09/2016   TRIG 147.0 08/09/2016     GEN- The patient is well appearing, alert and oriented x 3 today.   Head- normocephalic, atraumatic Eyes-  Sclera clear, conjunctiva pink Ears- hearing intact Oropharynx- clear Neck- supple, no JVP Lymph- no cervical lymphadenopathy Lungs- Clear to ausculation bilaterally, normal work of breathing Heart- irregular rate and rhythm, no murmurs, rubs or gallops, PMI not laterally displaced GI- soft, NT, ND, + BS Extremities- no clubbing, cyanosis, or edema MS- no significant deformity or atrophy Skin- no rash or lesion Psych- euthymic mood, full affect Neuro- strength and sensation are intact  EKG- Afib  with qrs int 84 ms, qtc 460 ms Epic records reviewed    Assessment and Plan: 1. Persistent afib General precautions re Tikosyn reviewed Failed flecainide and has been off  >3 days Aware of cost of tikosyn  Per Dr. Rayann Heman ok without 4 weeks of therapeutic  INR'sTEE , today INR at 2.0 Has not used any benadryl PharmD reviewed drugs and no issues Bmet/mag today with K+ at 4.0 and mag at 2. with crcl cal at 90.82 so will be beginning dose will be 500 mcg bid Qtc at 460 ms, ok per Dr. Lawrence Marseilles C. Whittaker Lenis, [facility] [address] [place], [place] [address] [phone]

## 2017-07-03 NOTE — Progress Notes (Signed)
Dr. Alveta Heimlich with cardiology paged the following at 2020:  "6E05:[place],C. Tikosyn load. 2nd dose due tonight.pt. converted to SB 46bpm after 1st dose. Scheduled to receive norvasc and dilt. hold dilt/both?"  Dr. Alveta Heimlich with call back at 2022:  Will hold dilt.

## 2017-07-03 NOTE — Progress Notes (Signed)
ANTICOAGULATION CONSULT NOTE - Initial Consult  Pharmacy Consult for Warfarin Indication: atrial fibrillation  Allergies  Allergen Reactions  . Ibuprofen Palpitations    Patient Measurements: Height: 6' (182.9 cm) Weight: 198 lb 1.6 oz (89.9 kg) IBW/kg (Calculated) : 77.6  Vital Signs: Temp: 97.6 F (36.4 C) (01/31 1255) Temp Source: Oral (01/31 1255) BP: 120/72 (01/31 1255) Pulse Rate: 84 (01/31 1030)  Labs: Recent Labs    07/03/17 1026 07/03/17 1126  LABPROT 22.5*  --   INR 2.00  --   CREATININE  --  1.14    Estimated Creatinine Clearance: 78.5 mL/min (by C-G formula based on SCr of 1.14 mg/dL).   Medical History: Past Medical History:  Diagnosis Date  . Arthritis    "maybe in my hands" (10/31/2016)  . Atrial flutter ([place])   . CHEST PAIN-PRECORDIAL 05/25/2009  . ERECTILE DYSFUNCTION, ORGANIC 01/16/2010  . GERD (gastroesophageal reflux disease)   . HYPERLIPIDEMIA 12/15/2007  . HYPERTENSION 12/15/2007  . HYPOKALEMIA 12/15/2007  . Internal hemorrhoids   . Paroxysmal atrial fibrillation ([place]) 12/15/2007  . POSTURAL LIGHTHEADEDNESS 07/06/2008  . Visit for monitoring Tikosyn therapy 07/03/2017    Assessment: 57 year old male admitted for Tikosyn administration On warfarin prior with admit INR of 2 Dose prior to admission - 7.5 mg all days except 5 mg on Mondays and Fridays  Goal of Therapy:  INR 2-3 Monitor platelets by anticoagulation protocol: Yes   Plan:  Warfarin 10 mg po x 1 tonight to prevent drop to less than 2 Daily INR  Thank you Anette Guarneri, PharmD [phone]  07/03/2017,2:16 PM

## 2017-07-04 LAB — PROTIME-INR
INR: 2.31
Prothrombin Time: 25.2 seconds — ABNORMAL HIGH (ref 11.4–15.2)

## 2017-07-04 LAB — BASIC METABOLIC PANEL
ANION GAP: 9 (ref 5–15)
BUN: 26 mg/dL — AB (ref 6–20)
CALCIUM: 8.9 mg/dL (ref 8.9–10.3)
CO2: 23 mmol/L (ref 22–32)
Chloride: 107 mmol/L (ref 101–111)
Creatinine, Ser: 1.17 mg/dL (ref 0.61–1.24)
GFR calc Af Amer: >60 mL/min (ref >60)
GLUCOSE: 107 mg/dL — AB (ref 65–99)
Potassium: 4.2 mmol/L (ref 3.5–5.1)
Sodium: 139 mmol/L (ref 135–145)

## 2017-07-04 LAB — MAGNESIUM: MAGNESIUM: 2 mg/dL (ref 1.7–2.4)

## 2017-07-04 MED ORDER — WARFARIN SODIUM 5 MG PO TABS
5.0000 mg | ORAL_TABLET | Freq: Once | ORAL | Status: AC
  Administered 2017-07-04: 5 mg via ORAL
  Filled 2017-07-04: qty 1

## 2017-07-04 NOTE — Care Management Note (Signed)
Case Management Note  Patient Details  Name: [name] [name] MRN: [id number] Date of Birth: [birth year]  Subjective/Objective: Pt presented for Persistent Atrial Fib. Plan for home on Tikosyn. Benefits Check completed and CM will make patient aware of cost. [facility] [address] and medication is available. CM will assist with the Rx for 7 day supply no refills via [facility] and pt will need original Rx with refills. No further needs from CM at this time.               Action/Plan: S / W  KIMBERLY @  [employer] RX # [id number]   1. TIKOSYN 125 MCG BID  COVER- YES  CO-PAY- $ 475.54  TIER- 5 DRUG  PRIOR APPROVAL- NO   2. TIKOSYN 250 MCG BID  COVER- YES  CO-PAY- $ 509.08  TIER- 5 DRUG  PRIOR APPROVAL- NO   3.TIKOSYN 500 MCG BID  COVER- YES  CO-PAY- $ 321.58  TIER- 5 DRUG  PRIOR APPROVAL- NO   4. DOFITILIDE  125 MCG BID  COVER- YES  CO-PAY- $ 65.00   TIER- 4 DRUG  PRIOR APPROVAL- NO   5 DOFETILIDE 250 MCG BID  COVER- YES  CO-PAY- $ 65.00                     TIER- 4 DRUG  PRIOR APPROVAL- NO   6. DOFETILIDE  500 MCG BID  COVER- YES  CO-PAY- $ 65.00           90 DAY SUPPLY $ 195.00  TIER- 4 PRIOR APPROVAL- NO  PRIOR APPROVAL- NO   [facility]   Expected Discharge Date:                  Expected Discharge Plan:  Home/Self Care  In-House Referral:  NA  Discharge planning Services  CM Consult, Medication Assistance  Post Acute Care Choice:  NA Choice offered to:  NA  DME Arranged:  N/A DME Agency:  NA  HH Arranged:  NA HH Agency:  NA  Status of Service:  Completed, signed off  If discussed at Long Length of Stay Meetings, dates discussed:    Additional Comments:  Bethena Roys, RN 07/04/2017, 11:13 AM

## 2017-07-04 NOTE — Discharge Instructions (Signed)

## 2017-07-04 NOTE — Progress Notes (Signed)
ANTICOAGULATION CONSULT NOTE  Pharmacy Consult:  Coumadin Indication: atrial fibrillation  Allergies  Allergen Reactions  . Ibuprofen Palpitations    Patient Measurements: Height: 6' (182.9 cm) Weight: 197 lb 6.4 oz (89.5 kg) IBW/kg (Calculated) : 77.6  Vital Signs: Temp: 98 F (36.7 C) (02/01 0528) Temp Source: Oral (02/01 0528) BP: 105/69 (02/01 0528) Pulse Rate: 47 (02/01 0528)  Labs: Recent Labs    07/03/17 1026 07/03/17 1126 07/04/17 0347  LABPROT 22.5*  --  25.2*  INR 2.00  --  2.31  CREATININE  --  1.14 1.17    Estimated Creatinine Clearance: 76.5 mL/min (by C-G formula based on SCr of 1.17 mg/dL).   Assessment: 57 year old male admitted for Tikosyn administration.  Pharmacy consulted to continue Coumadin from PTA.  INR remains therapeutic; no bleeding reported.  Home Coumadin dose:  7.5 mg all days except 5 mg on Mondays and Fridays   Goal of Therapy:  INR 2-3 Monitor platelets by anticoagulation protocol: Yes    Plan:   Coumadin 5mg  PO today Daily PT / INR   [name] D. Mina Marble, PharmD, BCPS Pager:  [phone] 07/04/2017, 7:44 AM

## 2017-07-05 DIAGNOSIS — I480 Paroxysmal atrial fibrillation: Secondary | ICD-10-CM

## 2017-07-05 LAB — PROTIME-INR
INR: 2.68
PROTHROMBIN TIME: 28.3 s — AB (ref 11.4–15.2)

## 2017-07-05 LAB — BASIC METABOLIC PANEL
Anion gap: 8 (ref 5–15)
BUN: 19 mg/dL (ref 6–20)
CALCIUM: 9.2 mg/dL (ref 8.9–10.3)
CHLORIDE: 107 mmol/L (ref 101–111)
CO2: 23 mmol/L (ref 22–32)
CREATININE: 1.09 mg/dL (ref 0.61–1.24)
GFR calc Af Amer: >60 mL/min (ref >60)
Glucose, Bld: 95 mg/dL (ref 65–99)
Potassium: 4.6 mmol/L (ref 3.5–5.1)
SODIUM: 138 mmol/L (ref 135–145)

## 2017-07-05 LAB — MAGNESIUM: MAGNESIUM: 2 mg/dL (ref 1.7–2.4)

## 2017-07-05 MED ORDER — OFF THE BEAT BOOK
Freq: Once | Status: AC
  Administered 2017-07-05: 19:00:00
  Filled 2017-07-05: qty 1

## 2017-07-05 MED ORDER — WARFARIN SODIUM 5 MG PO TABS
5.0000 mg | ORAL_TABLET | Freq: Once | ORAL | Status: AC
  Administered 2017-07-05: 5 mg via ORAL
  Filled 2017-07-05: qty 1

## 2017-07-05 MED ORDER — DOFETILIDE 250 MCG PO CAPS
250.0000 ug | ORAL_CAPSULE | Freq: Two times a day (BID) | ORAL | Status: DC
  Administered 2017-07-05 – 2017-07-06 (×2): 250 ug via ORAL
  Filled 2017-07-05 (×2): qty 1

## 2017-07-05 MED ORDER — DILTIAZEM HCL ER COATED BEADS 120 MG PO CP24
120.0000 mg | ORAL_CAPSULE | Freq: Every day | ORAL | Status: DC
Start: 2017-07-05 — End: 2017-07-06
  Administered 2017-07-05: 120 mg via ORAL
  Filled 2017-07-05: qty 1

## 2017-07-05 NOTE — Progress Notes (Signed)
ANTICOAGULATION CONSULT NOTE  Pharmacy Consult:  warfarin Indication: atrial fibrillation  Allergies  Allergen Reactions  . Ibuprofen Palpitations    Patient Measurements: Height: 6' (182.9 cm) Weight: 195 lb 12.8 oz (88.8 kg) IBW/kg (Calculated) : 77.6  Vital Signs: Temp: 97.3 F (36.3 C) (02/02 0445) Temp Source: Oral (02/02 0445) BP: 111/77 (02/02 0445)  Labs: Recent Labs    07/03/17 1026 07/03/17 1126 07/04/17 0347 07/05/17 0320  LABPROT 22.5*  --  25.2* 28.3*  INR 2.00  --  2.31 2.68  CREATININE  --  1.14 1.17 1.09    Estimated Creatinine Clearance: 82.1 mL/min (by C-G formula based on SCr of 1.09 mg/dL).   Assessment: 57 year old male admitted for Tikosyn administration.  Pharmacy consulted to continue warfarin from PTA.  INR remains therapeutic; no bleeding reported. Good PO intake, DDI interactions noted with dofetilide, doxazosin, warfarin, and diltiazem. Diltiazem dose was decreased by MD.  Home warfarin dose:  7.5 mg all days except 5 mg on Mondays and Fridays. Due to rapid INR trend upwards, will be conservative with dose.   Goal of Therapy:  INR 2-3 Monitor platelets by anticoagulation protocol: Yes    Plan:   Warfarin 5 mg PO x 1 Daily INR, CBCs F/u s/sx bleeding, PO intake, DDI  Nida Boatman, PharmD PGY1 Acute Care Pharmacy Resident Pager: [phone] 07/05/2017, 10:04 AM

## 2017-07-05 NOTE — Progress Notes (Signed)
Progress Note  Patient Name: [name] [name] Date of Encounter: 07/05/2017  Primary Cardiologist: Allred  Subjective   No chest pain or sob.   Inpatient Medications    Scheduled Meds: . amLODipine  2.5 mg Oral Daily  . atorvastatin  20 mg Oral Daily  . diltiazem  120 mg Oral Daily  . dofetilide  500 mcg Oral BID  . doxazosin  4 mg Oral QHS  . irbesartan  300 mg Oral q morning - 10a  . sodium chloride flush  3 mL Intravenous Q12H  . Warfarin - Pharmacist Dosing Inpatient   Does not apply q1800   Continuous Infusions: . sodium chloride     PRN Meds: sodium chloride, sodium chloride flush   Vital Signs    Vitals:   07/04/17 1955 07/04/17 2000 07/04/17 2300 07/05/17 0445  BP:  124/85 123/88 111/77  Pulse:      Resp:      Temp: 98.1 F (36.7 C)   (!) 97.3 F (36.3 C)  TempSrc: Oral   Oral  SpO2:      Weight:    195 lb 12.8 oz (88.8 kg)  Height:        Intake/Output Summary (Last 24 hours) at 07/05/2017 0905 Last data filed at 07/05/2017 0445 Gross per 24 hour  Intake 720 ml  Output -  Net 720 ml   Filed Weights   07/03/17 1433 07/04/17 0528 07/05/17 0445  Weight: 198 lb 1.6 oz (89.9 kg) 197 lb 6.4 oz (89.5 kg) 195 lb 12.8 oz (88.8 kg)    Telemetry    Sinus bradycardia - Personally Reviewed  ECG    Sinus bradycardia - Personally Reviewed  Physical Exam   GEN: No acute distress.   Neck: No JVD Cardiac: Reg brady, no murmurs, rubs, or gallops.  Respiratory: Clear to auscultation bilaterally. GI: Soft, nontender, non-distended  MS: No edema; No deformity. Neuro:  Nonfocal  Psych: Normal affect   Labs    Chemistry Recent Labs  Lab 07/03/17 1126 07/04/17 0347 07/05/17 0320  NA 138 139 138  K 4.0 4.2 4.6  CL 106 107 107  CO2 22 23 23   GLUCOSE 106* 107* 95  BUN 21* 26* 19  CREATININE 1.14 1.17 1.09  CALCIUM 9.3 8.9 9.2  GFRNONAA >60 >60 >60  GFRAA >60 >60 >60  ANIONGAP 10 9 8      HematologyNo results for input(s): WBC, RBC, HGB, HCT,  MCV, MCH, MCHC, RDW, PLT in the last 168 hours.  Cardiac EnzymesNo results for input(s): TROPONINI in the last 168 hours. No results for input(s): TROPIPOC in the last 168 hours.   BNPNo results for input(s): BNP, PROBNP in the last 168 hours.   DDimer No results for input(s): DDIMER in the last 168 hours.   Radiology    No results found.  Cardiac Studies   none  Patient Profile     57 y.o. male admitted for initiation of dofetilide  Assessment & Plan    1. PAF - he is maintaining NSR very nicely on dofetilide. His QT is ok.  2. Sinus node dysfunction - he is asymptomatic. I have reduced his dose of cardizem from 240 to 120 daily. 3. HTN - his blood pressure appears to be well controlled. No change in meds.  For questions or updates, please contact [place] Please consult [email or web address] for contact info under Cardiology/STEMI.      Signed, Cristopher Peru, MD  07/05/2017, 9:05 AM  Patient ID: [name] [name]  [name] [name], male   DOB: 09-02-1960, 57 y.o.   MRN: [id number]

## 2017-07-05 NOTE — Plan of Care (Signed)
Tikosyn education. Monitor electrolytes.

## 2017-07-05 NOTE — Progress Notes (Signed)
    Called by RN to review ECG from 11 AM - 3 hrs post AM dose of Tikosyn.  QTc significantly widened - 514 msec on ECG - calculated to 526 by my measurement.  Discussed with Dr. Percival Spanish - will reduce tikosyn dose to 250 mcg q12h. F/u ECG 2 hrs after 20:00 dose.   Murray Hodgkins, NP

## 2017-07-06 ENCOUNTER — Encounter (HOSPITAL_COMMUNITY): Payer: Self-pay | Admitting: Physician Assistant

## 2017-07-06 DIAGNOSIS — I4892 Unspecified atrial flutter: Secondary | ICD-10-CM

## 2017-07-06 DIAGNOSIS — R001 Bradycardia, unspecified: Secondary | ICD-10-CM

## 2017-07-06 LAB — BASIC METABOLIC PANEL
ANION GAP: 9 (ref 5–15)
BUN: 20 mg/dL (ref 6–20)
CO2: 23 mmol/L (ref 22–32)
Calcium: 9.1 mg/dL (ref 8.9–10.3)
Chloride: 105 mmol/L (ref 101–111)
Creatinine, Ser: 1.21 mg/dL (ref 0.61–1.24)
GLUCOSE: 101 mg/dL — AB (ref 65–99)
POTASSIUM: 4.9 mmol/L (ref 3.5–5.1)
SODIUM: 137 mmol/L (ref 135–145)

## 2017-07-06 LAB — MAGNESIUM: MAGNESIUM: 2.2 mg/dL (ref 1.7–2.4)

## 2017-07-06 LAB — PROTIME-INR
INR: 2.62
PROTHROMBIN TIME: 27.8 s — AB (ref 11.4–15.2)

## 2017-07-06 MED ORDER — DILTIAZEM HCL ER COATED BEADS 120 MG PO CP24: 120.0000 mg | ORAL_CAPSULE | Freq: Every day | ORAL | 3 refills | Status: DC

## 2017-07-06 MED ORDER — WARFARIN SODIUM 7.5 MG PO TABS: 7.5000 mg | ORAL_TABLET | Freq: Once | ORAL | Status: DC

## 2017-07-06 MED ORDER — DOFETILIDE 250 MCG PO CAPS: 250.0000 ug | ORAL_CAPSULE | Freq: Two times a day (BID) | ORAL | Status: DC

## 2017-07-06 NOTE — Discharge Summary (Signed)
Discharge Summary    Patient ID: [name] [name],  MRN: [id number], DOB/AGE: [birth year] 57 y.o.  Admit date: 07/03/2017 Discharge date: 07/06/2017  Primary Care Provider: Biagio [name] Primary EP: Dr. Rayann [name]  Discharge Diagnoses    Principal Problem:   Persistent atrial fibrillation [facility]) Active Problems:   Paroxysmal atrial flutter (HCC)   Essential hypertension   Sinus bradycardia  Diagnostic Studies/Procedures    N/a _____________     History of Present Illness     [name] [name] is a 57 y.o. male with history of Longstanding atrial fib/flutter, GERD, HTN, HLD, postural lightheadedness, arthritis, hypokalemia, internal hemorrhoids who presented to [facility] for planned Tikosyn loading.  He previously failed both flecainide and ablation x 3, first for flutter and the last two for fibrillation. He has also had prior cardioversion. He has been off flecainide since 1/21. Per afib clnic notes, per Dr. Rayann [name] he was OK without weekly INR's or TEE prior to admission. INR was 2.7 on 06/23/17. Baseline QTc was 435ms during AF clinic but felt to be acceptable by the electrophysiology team upon arrival.   Hospital Course    His electrolytes were satisfactory. He was placed on 550mcg BID. Daily labs were followed along with QT interval. Yesterday his QT was noted to go to 582ms reported on tracing, calculated at 526 by APP measurement. Tikosyn dose was reduced to 250mg  mcg BID. F/u EKG yesterday PM showed improvement in QTC to 478ms. This morning it is ranging 470-478ms on telemetry. He has not had any adverse arrhythmias. During admission he was noted to have sinus bradycardia thus diltiazem dose was reduced to 120mg  daily. He was also continued on amlodipine 2.5mg  daily which Dr. Lovena [name] has approved. Dr. Lovena [name] has seen and examined the patient today and feels he is stable for discharge and feels he is OK to return to work on Tuesday. Given QT issues this admission, the patient's prescriptions  (7-day for in-house pharmacy, then 30 day supply with refills) were handwritten and signed by EP MD Dr. Lovena [name]. He has f/u with Coumadin clinic 2/11 and will continue home dose as outlined on AVS (7.5mg  daily on all days except 5mg  on M/F). I have sent a message to the afib clinic scheduling team requesting a follow-up appointment per usual Tikosyn protocol, and the office will call the patient with this information.  _____________  Discharge Vitals Blood pressure 107/73, pulse (!) 54, temperature 97.8 F (36.6 C), temperature source Oral, resp. rate 16, height 6' (1.829 m), weight 196 lb 1.6 oz (89 kg), SpO2 99 %.  Filed Weights   07/04/17 0528 07/05/17 0445 07/06/17 0533  Weight: 197 lb 6.4 oz (89.5 kg) 195 lb 12.8 oz (88.8 kg) 196 lb 1.6 oz (89 kg)    Labs & Radiologic Studies    CBC No results for input(s): WBC, NEUTROABS, HGB, HCT, MCV, PLT in the last 72 hours. Basic Metabolic Panel Recent Labs    07/05/17 0320 07/06/17 0530  NA 138 137  K 4.6 4.9  CL 107 105  CO2 23 23  GLUCOSE 95 101*  BUN 19 20  CREATININE 1.09 1.21  CALCIUM 9.2 9.1  MG 2.0 2.2  ____________  No results found. Disposition   Pt is being discharged home today in good condition.  Follow-up Plans & Appointments    Follow-up Information    [facility] Follow up.   Specialty:  Cardiology Why:  The atrial fib clinic will call you for a  followup to be seen within approximately a week. Call the office on Tuesday if you have not heard back by then. Contact information: [address] [id number] [place] [phone]         Discharge Instructions    Diet - low sodium heart healthy   Complete by:  As directed    Discharge instructions   Complete by:  As directed    Take your Tikosyn prescription to your pharmacy today so they can begin ordering this and filling it for you.  Your diltiazem dose was changed (new prescription, sent into  your pharmacy). Your amlodipine dose should be 2.5mg  daily due to lower blood pressures.  Continue your home dose of Coumadin which was 7.5 mg all days except 5 mg on Mondays and Fridays.  You may return to work on Tuesday (07/09/17) - work note provided.   Increase activity slowly   Complete by:  As directed       Discharge Medications   Allergies as of 07/06/2017      Reactions   Ibuprofen Palpitations      Medication List    TAKE these medications   amLODipine 5 MG tablet Commonly known as:  NORVASC Take 0.5 tablets (2.5 mg total) by mouth daily. What changed:  how much to take   atorvastatin 20 MG tablet Commonly known as:  LIPITOR Take 1 tablet (20 mg total) by mouth daily.   diltiazem 120 MG 24 hr capsule Commonly known as:  CARDIZEM CD Take 1 capsule (120 mg total) by mouth daily. What changed:    medication strength  how much to take   diltiazem 30 MG tablet Commonly known as:  CARDIZEM Take 30 mg by mouth as needed (afib).   dofetilide 250 MCG capsule Commonly known as:  TIKOSYN Take 1 capsule (250 mcg total) by mouth 2 (two) times daily. (See paper prescriptions from Dr. Lovena [name])   doxazosin 4 MG tablet Commonly known as:  CARDURA Take 1 tablet (4 mg total) by mouth at bedtime.   irbesartan 300 MG tablet Commonly known as:  AVAPRO TAKE 1 TABLET BY MOUTH EVERY MORNING   tadalafil 20 MG tablet Commonly known as:  CIALIS Take 1 tablet (20 mg total) by mouth daily as needed for erectile dysfunction.   warfarin 5 MG tablet Commonly known as:  COUMADIN Take as directed. If you are unsure how to take this medication, talk to your nurse or doctor. Original instructions:  Take as directed by coumadin clinic What changed:  additional instructions        Allergies:  Allergies  Allergen Reactions  . Ibuprofen Palpitations     Outstanding Labs/Studies   Will need OP BMET/Mg  Duration of Discharge Encounter   Greater than 30 minutes including  physician time.  Signed, [name] Hai Dunn PA-C 07/06/2017, 10:15 AM  EP Attending  Patient seen and examined. Agree with above. He is doing well today and appears to be maintaining NSR. His dose of dofetilide was reduced with QTC of 500. Now better. He is stable for DC home with usual followup.   Mikle Bosworth.D.

## 2017-07-06 NOTE — Progress Notes (Signed)
ANTICOAGULATION CONSULT NOTE  Pharmacy Consult:  warfarin Indication: atrial fibrillation  Allergies  Allergen Reactions  . Ibuprofen Palpitations    Patient Measurements: Height: 6' (182.9 cm) Weight: 196 lb 1.6 oz (89 kg) IBW/kg (Calculated) : 77.6  Vital Signs: Temp: 97.8 F (36.6 C) (02/03 0533) Temp Source: Oral (02/03 0533) BP: 107/73 (02/03 0533) Pulse Rate: 54 (02/03 0533)  Labs: Recent Labs    07/04/17 0347 07/05/17 0320 07/06/17 0530  LABPROT 25.2* 28.3* 27.8*  INR 2.31 2.68 2.62  CREATININE 1.17 1.09 1.21    Estimated Creatinine Clearance: 73.9 mL/min (by C-G formula based on SCr of 1.21 mg/dL).   Assessment: 57 year old male admitted for Tikosyn administration. QTc elevated to ~520 per MD; tikosyn dose reduced to 250 mcg BID yesterday. Pharmacy consulted to continue warfarin from PTA.  INR remains therapeutic; no bleeding reported. Good PO intake, DDI interactions noted with dofetilide, doxazosin, warfarin, and diltiazem. Diltiazem dose was decreased by MD yesterday.   Home warfarin dose:  7.5 mg all days except 5 mg on Mondays and Fridays.   Goal of Therapy:  INR 2-3 Monitor platelets by anticoagulation protocol: Yes    Plan:   Warfarin 7.5 mg PO x 1 Daily INR, CBCs F/u s/sx bleeding, PO intake, DDI  Nida Boatman, PharmD PGY1 Acute Care Pharmacy Resident Pager: [phone] 07/06/2017, 9:09 AM

## 2017-07-08 ENCOUNTER — Telehealth: Payer: Self-pay | Admitting: *Deleted

## 2017-07-08 NOTE — Telephone Encounter (Signed)
Pt was on TCM report admitted 07/03/17 for Persistent atrial fibrillation. During admission he was noted to have sinus bradycardia thus diltiazem dose was reduced to 120mg  daily. He was also continued on amlodipine 2.5mg  daily which Dr. Lovena Le has approved. Dr. Lovena Le has seen and examined the patient, and will be D/C 07/06/17. Pt will f/u w/cardiology The atrial fib clinic in a week...[name] [name]

## 2017-07-11 ENCOUNTER — Other Ambulatory Visit: Payer: Self-pay | Admitting: Internal Medicine

## 2017-07-14 ENCOUNTER — Encounter (HOSPITAL_COMMUNITY): Payer: Self-pay | Admitting: Nurse Practitioner

## 2017-07-14 ENCOUNTER — Ambulatory Visit (HOSPITAL_COMMUNITY)
Admission: RE | Admit: 2017-07-14 | Discharge: 2017-07-14 | Disposition: A | Discharge status: Home / Self Care | Payer: BLUE CROSS/BLUE SHIELD | Source: Ambulatory Visit | Attending: Nurse Practitioner | Admitting: Nurse Practitioner

## 2017-07-14 VITALS — BP 108/76 | HR 115 | Ht 72.0 in | Wt 202.0 lb

## 2017-07-14 DIAGNOSIS — E785 Hyperlipidemia, unspecified: Secondary | ICD-10-CM | POA: Insufficient documentation

## 2017-07-14 DIAGNOSIS — Z79899 Other long term (current) drug therapy: Secondary | ICD-10-CM | POA: Diagnosis not present

## 2017-07-14 DIAGNOSIS — K219 Gastro-esophageal reflux disease without esophagitis: Secondary | ICD-10-CM | POA: Insufficient documentation

## 2017-07-14 DIAGNOSIS — I10 Essential (primary) hypertension: Secondary | ICD-10-CM | POA: Insufficient documentation

## 2017-07-14 DIAGNOSIS — Z87891 Personal history of nicotine dependence: Secondary | ICD-10-CM | POA: Insufficient documentation

## 2017-07-14 DIAGNOSIS — I4819 Other persistent atrial fibrillation: Secondary | ICD-10-CM

## 2017-07-14 DIAGNOSIS — I481 Persistent atrial fibrillation: Secondary | ICD-10-CM | POA: Insufficient documentation

## 2017-07-14 DIAGNOSIS — Z7901 Long term (current) use of anticoagulants: Secondary | ICD-10-CM | POA: Insufficient documentation

## 2017-07-14 LAB — BASIC METABOLIC PANEL
ANION GAP: 8 (ref 5–15)
BUN: 19 mg/dL (ref 6–20)
CALCIUM: 9 mg/dL (ref 8.9–10.3)
CO2: 22 mmol/L (ref 22–32)
CREATININE: 1.15 mg/dL (ref 0.61–1.24)
Chloride: 108 mmol/L (ref 101–111)
Glucose, Bld: 120 mg/dL — ABNORMAL HIGH (ref 65–99)
Potassium: 4.4 mmol/L (ref 3.5–5.1)
SODIUM: 138 mmol/L (ref 135–145)

## 2017-07-14 LAB — PROTIME-INR
INR: 2.16
PROTHROMBIN TIME: 23.9 s — AB (ref 11.4–15.2)

## 2017-07-14 LAB — MAGNESIUM: MAGNESIUM: 1.9 mg/dL (ref 1.7–2.4)

## 2017-07-14 NOTE — Progress Notes (Signed)
Primary Care Physician: Biagio Borg, MD Referring Physician: Dr. Hart Carwin is a 57 y.o. male with a h/o long standing h/o afib/flutter,currently persistent, in the past failing flecainide and ablation x 3, first for flutter and the last two for fibrillation. He has been off flecainide since 1/21. He is in the afib clinic for admission for Tikosyn. No benadryl use and per Dr. Rayann Heman he is OK without weekly INR's or TEE prior to admission. He was 2.7 on 06/23/17 and INR is pending today. He is aware of the cost of tikoyn and wants to proceed.  F/u in afib clinic, 2/11, he reports that he did well for a week in [place] but since Saturday. He was watching a basketball game, got excited and felt it later that day get out of rhythm. He has been in and out of  rhythm since then.  Today, he denies symptoms of palpitations, chest pain, shortness of breath, orthopnea, PND, lower extremity edema, dizziness, presyncope, syncope, or neurologic sequela. The patient is tolerating medications without difficulties and is otherwise without complaint today.   Past Medical History:  Diagnosis Date  . Arthritis    "maybe in my hands" (10/31/2016)  . Atrial flutter ([place])   . CHEST PAIN-PRECORDIAL 05/25/2009  . ERECTILE DYSFUNCTION, ORGANIC 01/16/2010  . GERD (gastroesophageal reflux disease)   . HYPERLIPIDEMIA 12/15/2007  . HYPERTENSION 12/15/2007  . HYPOKALEMIA 12/15/2007  . Internal hemorrhoids   . Persistent atrial fibrillation ([place])    a. prior ablation/flecainide; placed on Tikosyn 07/2017.  Marland Kitchen POSTURAL LIGHTHEADEDNESS 07/06/2008  . Sinus bradycardia   . Visit for monitoring Tikosyn therapy 07/03/2017   Past Surgical History:  Procedure Laterality Date  . ATRIAL FIBRILLATION ABLATION N/A 08/13/2011   Procedure: ATRIAL FIBRILLATION ABLATION;  Surgeon: Thompson Grayer, MD;  Location: [facility] CATH LAB;  Service: Cardiovascular;  Laterality: N/A;  . ATRIAL FIBRILLATION ABLATION  10/31/2016  . ATRIAL  FIBRILLATION ABLATION N/A 10/31/2016   Procedure: Atrial Fibrillation Ablation;  Surgeon: Thompson Grayer, MD;  Location: [place] CV LAB;  Service: Cardiovascular;  Laterality: N/A;  . ATRIAL FLUTTER ABLATION N/A 12/27/2011   CTI repeat ablation by Dr Rayann Heman  . CARDIOVERSION  11/21/2011   Procedure: CARDIOVERSION;  Surgeon: Larey Dresser, MD;  Location: [place];  Service: Cardiovascular;  Laterality: N/A;  . CARDIOVERSION N/A 12/02/2016   Procedure: CARDIOVERSION;  Surgeon: [name], MD;  Location: [place];  Service: Cardiovascular;  Laterality: N/A;  . CHALAZION EXCISION Right   . ELBOW SURGERY Right early 1980s   bone spur/chip; "opened me up"  . INGUINAL HERNIA REPAIR Right 11/2006  . TEE WITHOUT CARDIOVERSION  08/12/2011   Procedure: TRANSESOPHAGEAL ECHOCARDIOGRAM (TEE);  Surgeon: Fay Records, MD;  Location: [facility] ENDOSCOPY;  Service: Cardiovascular;  Laterality: N/A;  . TEE WITHOUT CARDIOVERSION N/A 10/30/2016   Procedure: TRANSESOPHAGEAL ECHOCARDIOGRAM (TEE);  Surgeon: Josue Hector, MD;  Location: [facility] ENDOSCOPY;  Service: Cardiovascular;  Laterality: N/A;  . TEE WITHOUT CARDIOVERSION N/A 12/02/2016   Procedure: TRANSESOPHAGEAL ECHOCARDIOGRAM (TEE);  Surgeon: [name], MD;  Location: [facility] ENDOSCOPY;  Service: Cardiovascular;  Laterality: N/A;    Current Outpatient Medications  Medication Sig Dispense Refill  . amLODipine (NORVASC) 5 MG tablet Take 0.5 tablets (2.5 mg total) by mouth daily. (Patient taking differently: Take 5 mg by mouth daily. ) 90 tablet 3  . atorvastatin (LIPITOR) 20 MG tablet Take 1 tablet (20 mg total) by mouth daily. 90 tablet 3  .  diltiazem (CARDIZEM CD) 120 MG 24 hr capsule Take 1 capsule (120 mg total) by mouth daily. 30 capsule 3  . diltiazem (CARDIZEM) 30 MG tablet Take 30 mg by mouth as needed (afib).     . dofetilide (TIKOSYN) 250 MCG capsule Take 1 capsule (250 mcg total) by mouth 2 (two) times daily. (See paper prescriptions from Dr. Lovena Le)     . doxazosin (CARDURA) 4 MG tablet TAKE 1 TABLET BY MOUTH AT BEDTIME 90 tablet 0  . irbesartan (AVAPRO) 300 MG tablet TAKE 1 TABLET BY MOUTH EVERY DAY IN THE MORNING 90 tablet 0  . tadalafil (CIALIS) 20 MG tablet Take 1 tablet (20 mg total) by mouth daily as needed for erectile dysfunction. 10 tablet 11  . warfarin (COUMADIN) 5 MG tablet Take as directed by coumadin clinic (Patient taking differently: 5mg  on Mon and Fri, 7.5mg  on all other days OR take as directed by coumadin clinic) 150 tablet 0   No current facility-administered medications for this encounter.     Allergies  Allergen Reactions  . Ibuprofen Palpitations    Social History   Socioeconomic History  . Marital status: Married    Spouse name: Not on file  . Number of children: 2  . Years of education: Not on file  . Highest education level: Not on file  Social Needs  . Financial resource strain: Not on file  . Food insecurity - worry: Not on file  . Food insecurity - inability: Not on file  . Transportation needs - medical: Not on file  . Transportation needs - non-medical: Not on file  Occupational History  . Occupation: SECURITY    Employer: [employer]  Tobacco Use  . Smoking status: Former Smoker    Packs/day: 0.10    Years: 15.00    Pack years: 1.50    Types: Cigarettes    Last attempt to quit: 12/17/2010    Years since quitting: 6.5  . Smokeless tobacco: Never Used  Substance and Sexual Activity  . Alcohol use: Yes    Comment: "1 glass of wine/month maybe" (10/31/2016)  . Drug use: No  . Sexual activity: Not Currently  Other Topics Concern  . Not on file  Social History Narrative   Pt lives in [place] with spouse. 2 grown children.   Works at [employer] in [place].    Family History  Problem Relation Age of Onset  . Stroke Mother   . Heart disease Sister   . Thyroid disease Sister   . Sleep apnea Sister   . Other Father        deceased stomach hemorrhage  . Anesthesia  problems Neg Hx     ROS- All systems are reviewed and negative except as per the HPI above  Physical Exam: Vitals:   07/14/17 1513  BP: 108/76  Pulse: (!) 115  Weight: 202 lb (91.6 kg)  Height: 6' (1.829 m)   Wt Readings from Last 3 Encounters:  07/14/17 202 lb (91.6 kg)  07/06/17 196 lb 1.6 oz (89 kg)  07/03/17 198 lb 9.6 oz (90.1 kg)    Labs: Lab Results  Component Value Date   NA 137 07/06/2017   K 4.9 07/06/2017   CL 105 07/06/2017   CO2 23 07/06/2017   GLUCOSE 101 (H) 07/06/2017   BUN 20 07/06/2017   CREATININE 1.21 07/06/2017   CALCIUM 9.1 07/06/2017   MG 2.2 07/06/2017   Lab Results  Component Value Date   INR 2.62 07/06/2017  Lab Results  Component Value Date   CHOL 159 08/09/2016   HDL 55.00 08/09/2016   LDLCALC 75 08/09/2016   TRIG 147.0 08/09/2016     GEN- The patient is well appearing, alert and oriented x 3 today.   Head- normocephalic, atraumatic Eyes-  Sclera clear, conjunctiva pink Ears- hearing intact Oropharynx- clear Neck- supple, no JVP Lymph- no cervical lymphadenopathy Lungs- Clear to ausculation bilaterally, normal work of breathing Heart- irregular rate and rhythm, no murmurs, rubs or gallops, PMI not laterally displaced GI- soft, NT, ND, + BS Extremities- no clubbing, cyanosis, or edema MS- no significant deformity or atrophy Skin- no rash or lesion Psych- euthymic mood, full affect Neuro- strength and sensation are intact  EKG- Afib  V rate at 115 bpm, qrs int 80 ms, qtc 531 ms( qt interval reviewed with DrRayann Heman and ok to continue.) Epic records reviewed    Assessment and Plan: 1. Persistent afib General precautions re Tikosyn reviewed Failed flecainide and now is in afib intermittently since Saturday, did well in [place] for almost one week. Continue tikosyn at 250 mcg bid Continue diltiazem at 120 mg qd, will not increase for soft BP but can take 30 mg if HR becomes uncomfortable Bmet/mag today   Recheck this Friday  in clinic  Center. [facility]esbit, [facility] [address] [place], [place] [address] [phone]

## 2017-07-15 ENCOUNTER — Ambulatory Visit (INDEPENDENT_AMBULATORY_CARE_PROVIDER_SITE_OTHER): Payer: BLUE CROSS/BLUE SHIELD | Admitting: Pharmacist

## 2017-07-15 DIAGNOSIS — I4819 Other persistent atrial fibrillation: Secondary | ICD-10-CM

## 2017-07-15 DIAGNOSIS — Z5181 Encounter for therapeutic drug level monitoring: Secondary | ICD-10-CM

## 2017-07-15 DIAGNOSIS — I481 Persistent atrial fibrillation: Secondary | ICD-10-CM

## 2017-07-15 NOTE — Patient Instructions (Signed)
Description   Spoke with Pt checked in afib clinic yesterday-needs 6 week appt. Continue taking 7.5mg  daily except 5mg  on Mondays and Fridays.  Recheck INR in 6 weeks. Call us with any medication changes # [phone]

## 2017-07-18 ENCOUNTER — Ambulatory Visit (HOSPITAL_COMMUNITY)
Admission: RE | Admit: 2017-07-18 | Discharge: 2017-07-18 | Disposition: A | Discharge status: Home / Self Care | Payer: BLUE CROSS/BLUE SHIELD | Source: Ambulatory Visit | Attending: Nurse Practitioner | Admitting: Nurse Practitioner

## 2017-07-18 ENCOUNTER — Encounter (HOSPITAL_COMMUNITY): Payer: Self-pay | Admitting: Nurse Practitioner

## 2017-07-18 DIAGNOSIS — I4581 Long QT syndrome: Secondary | ICD-10-CM | POA: Insufficient documentation

## 2017-07-18 DIAGNOSIS — I4891 Unspecified atrial fibrillation: Secondary | ICD-10-CM | POA: Diagnosis not present

## 2017-07-18 DIAGNOSIS — R001 Bradycardia, unspecified: Secondary | ICD-10-CM | POA: Diagnosis not present

## 2017-07-18 DIAGNOSIS — I481 Persistent atrial fibrillation: Secondary | ICD-10-CM | POA: Insufficient documentation

## 2017-07-18 NOTE — Progress Notes (Addendum)
Pt in for repeat EKG; to be reviewed by Roderic Palau, NP  Pt in for EKG for review of recent  Tikosyn administration where he was found to be in afib last week. He reports that he went back in SR just a few days after he saw me and has continued in SR. He feels improved.QTC 484 ms

## 2017-07-20 ENCOUNTER — Other Ambulatory Visit: Payer: Self-pay | Admitting: Internal Medicine

## 2017-07-21 ENCOUNTER — Other Ambulatory Visit: Payer: Self-pay | Admitting: Internal Medicine

## 2017-08-26 ENCOUNTER — Ambulatory Visit (INDEPENDENT_AMBULATORY_CARE_PROVIDER_SITE_OTHER): Payer: BLUE CROSS/BLUE SHIELD | Admitting: *Deleted

## 2017-08-26 DIAGNOSIS — I4891 Unspecified atrial fibrillation: Secondary | ICD-10-CM

## 2017-08-26 DIAGNOSIS — I481 Persistent atrial fibrillation: Secondary | ICD-10-CM

## 2017-08-26 DIAGNOSIS — I4819 Other persistent atrial fibrillation: Secondary | ICD-10-CM

## 2017-08-26 DIAGNOSIS — Z5181 Encounter for therapeutic drug level monitoring: Secondary | ICD-10-CM | POA: Diagnosis not present

## 2017-08-26 LAB — POCT INR: INR: 3.1

## 2017-08-26 MED ORDER — WARFARIN SODIUM 5 MG PO TABS
ORAL_TABLET | ORAL | 0 refills | Status: DC
Start: 2017-08-26 — End: 2018-06-19

## 2017-08-26 NOTE — Patient Instructions (Signed)
Description   Today take 5mg  then continue taking 7.5mg  daily except 5mg  on Mondays and Fridays.  Recheck INR in 4 weeks. Call us with any medication changes # [phone]

## 2017-09-01 ENCOUNTER — Encounter: Payer: Self-pay | Admitting: Internal Medicine

## 2017-09-08 ENCOUNTER — Encounter: Payer: Self-pay | Admitting: Internal Medicine

## 2017-09-08 ENCOUNTER — Ambulatory Visit (INDEPENDENT_AMBULATORY_CARE_PROVIDER_SITE_OTHER): Payer: BLUE CROSS/BLUE SHIELD | Admitting: Internal Medicine

## 2017-09-08 ENCOUNTER — Other Ambulatory Visit (INDEPENDENT_AMBULATORY_CARE_PROVIDER_SITE_OTHER): Payer: BLUE CROSS/BLUE SHIELD

## 2017-09-08 VITALS — BP 120/78 | HR 63 | Temp 98.0°F | Ht 72.0 in | Wt 183.0 lb

## 2017-09-08 DIAGNOSIS — N529 Male erectile dysfunction, unspecified: Secondary | ICD-10-CM

## 2017-09-08 DIAGNOSIS — R739 Hyperglycemia, unspecified: Secondary | ICD-10-CM

## 2017-09-08 DIAGNOSIS — Z0000 Encounter for general adult medical examination without abnormal findings: Secondary | ICD-10-CM

## 2017-09-08 DIAGNOSIS — I10 Essential (primary) hypertension: Secondary | ICD-10-CM

## 2017-09-08 DIAGNOSIS — R9431 Abnormal electrocardiogram [ECG] [EKG]: Secondary | ICD-10-CM | POA: Insufficient documentation

## 2017-09-08 LAB — URINALYSIS, ROUTINE W REFLEX MICROSCOPIC
BILIRUBIN URINE: NEGATIVE
KETONES UR: NEGATIVE
Leukocytes, UA: NEGATIVE
Nitrite: NEGATIVE
PH: 5.5 (ref 5.0–8.0)
Specific Gravity, Urine: >=1.03 — AB (ref 1.000–1.030)
Total Protein, Urine: NEGATIVE
UROBILINOGEN UA: 0.2 (ref 0.0–1.0)
Urine Glucose: NEGATIVE
WBC UA: NONE SEEN (ref 0–?)

## 2017-09-08 LAB — CBC WITH DIFFERENTIAL/PLATELET
BASOS ABS: 0.1 10*3/uL (ref 0.0–0.1)
Basophils Relative: 0.8 % (ref 0.0–3.0)
EOS ABS: 0.3 10*3/uL (ref 0.0–0.7)
Eosinophils Relative: 3 % (ref 0.0–5.0)
HEMATOCRIT: 42.2 % (ref 39.0–52.0)
Hemoglobin: 14.3 g/dL (ref 13.0–17.0)
LYMPHS PCT: 28 % (ref 12.0–46.0)
Lymphs Abs: 2.4 10*3/uL (ref 0.7–4.0)
MCHC: 33.9 g/dL (ref 30.0–36.0)
MCV: 88.5 fl (ref 78.0–100.0)
MONOS PCT: 13.2 % — AB (ref 3.0–12.0)
Monocytes Absolute: 1.1 10*3/uL — ABNORMAL HIGH (ref 0.1–1.0)
NEUTROS ABS: 4.8 10*3/uL (ref 1.4–7.7)
Neutrophils Relative %: 55 % (ref 43.0–77.0)
PLATELETS: 200 10*3/uL (ref 150.0–400.0)
RBC: 4.77 Mil/uL (ref 4.22–5.81)
RDW: 13.4 % (ref 11.5–15.5)
WBC: 8.7 10*3/uL (ref 4.0–10.5)

## 2017-09-08 LAB — HEPATIC FUNCTION PANEL
ALT: 37 U/L (ref 0–53)
AST: 24 U/L (ref 0–37)
Albumin: 4.3 g/dL (ref 3.5–5.2)
Alkaline Phosphatase: 58 U/L (ref 39–117)
Bilirubin, Direct: 0.2 mg/dL (ref 0.0–0.3)
Total Bilirubin: 1 mg/dL (ref 0.2–1.2)
Total Protein: 7.6 g/dL (ref 6.0–8.3)

## 2017-09-08 LAB — LIPID PANEL
Cholesterol: 171 mg/dL (ref 0–200)
HDL: 52.8 mg/dL (ref >39.00)
LDL Cholesterol: 90 mg/dL (ref 0–99)
NonHDL: 118.64
Total CHOL/HDL Ratio: 3
Triglycerides: 145 mg/dL (ref 0.0–149.0)
VLDL: 29 mg/dL (ref 0.0–40.0)

## 2017-09-08 LAB — PSA: PSA: 0.81 ng/mL (ref 0.10–4.00)

## 2017-09-08 LAB — BASIC METABOLIC PANEL
BUN: 25 mg/dL — ABNORMAL HIGH (ref 6–23)
CO2: 23 mEq/L (ref 19–32)
Calcium: 9.4 mg/dL (ref 8.4–10.5)
Chloride: 107 mEq/L (ref 96–112)
Creatinine, Ser: 1.14 mg/dL (ref 0.40–1.50)
GFR: 84.83 mL/min (ref >60.00)
Glucose, Bld: 95 mg/dL (ref 70–99)
Potassium: 3.8 mEq/L (ref 3.5–5.1)
Sodium: 139 mEq/L (ref 135–145)

## 2017-09-08 LAB — TSH: TSH: 0.84 u[IU]/mL (ref 0.35–4.50)

## 2017-09-08 LAB — HEMOGLOBIN A1C: HEMOGLOBIN A1C: 6 % (ref 4.6–6.5)

## 2017-09-08 LAB — TESTOSTERONE: TESTOSTERONE: 301.51 ng/dL (ref 300.00–890.00)

## 2017-09-08 MED ORDER — DILTIAZEM HCL ER COATED BEADS 120 MG PO CP24: 120.0000 mg | ORAL_CAPSULE | Freq: Every day | ORAL | 3 refills | Status: DC

## 2017-09-08 MED ORDER — DOXAZOSIN MESYLATE 4 MG PO TABS: 4.0000 mg | ORAL_TABLET | Freq: Every day | ORAL | 3 refills | Status: DC

## 2017-09-08 MED ORDER — ATORVASTATIN CALCIUM 20 MG PO TABS: 20.0000 mg | ORAL_TABLET | Freq: Every day | ORAL | 3 refills | Status: DC

## 2017-09-08 MED ORDER — ZOSTER VAC RECOMB ADJUVANTED 50 MCG/0.5ML IM SUSR: 0.5000 mL | Freq: Once | INTRAMUSCULAR | 1 refills | Status: AC

## 2017-09-08 MED ORDER — IRBESARTAN 300 MG PO TABS
ORAL_TABLET | ORAL | 3 refills | Status: DC
Start: 2017-09-08 — End: 2017-10-20

## 2017-09-08 MED ORDER — AMLODIPINE BESYLATE 2.5 MG PO TABS: 2.5000 mg | ORAL_TABLET | Freq: Every day | ORAL | 3 refills | Status: DC

## 2017-09-08 NOTE — Patient Instructions (Addendum)
Your shingles shot prescription was sent to the pharmacy  Please continue all other medications as before, and refills have been done if requested.  Please have the pharmacy call with any other refills you may need.  Please continue your efforts at being more active, low cholesterol diet, and weight control.  You are otherwise up to date with prevention measures today.  Please keep your appointments with your specialists as you may have planned  Please go to the LAB in the Basement (turn left off the elevator) for the tests to be done today  You will be contacted by phone if any changes need to be made immediately.  Otherwise, you will receive a letter about your results with an explanation, but please check with MyChart first.  Please remember to sign up for MyChart if you have not done so, as this will be important to you in the future with finding out test results, communicating by private email, and scheduling acute appointments online when needed.  Please return in 1 year for your yearly visit, or sooner if needed, with Lab testing done 3-5 days before

## 2017-09-08 NOTE — Assessment & Plan Note (Signed)
stable overall by history and exam, recent data reviewed with pt, and pt to continue medical treatment as before,  to f/u any worsening symptoms or concerns BP Readings from Last 3 Encounters:  09/08/17 120/78  07/18/17 120/78  07/14/17 108/76

## 2017-09-08 NOTE — Assessment & Plan Note (Signed)
Mild, asympt,  Lab Results  Component Value Date   HGBA1C 6.0 09/08/2017  stable overall by history and exam, recent data reviewed with pt, and pt to continue medical treatment as before,  to f/u any worsening symptoms or concerns

## 2017-09-08 NOTE — Assessment & Plan Note (Signed)
For referral Urology

## 2017-09-08 NOTE — Progress Notes (Signed)
Subjective:    Patient ID: [name] [name], male    DOB: [birth year], 57 y.o.   MRN: [id number]  HPI  Here for wellness and f/u;  Overall doing ok;  Pt denies Chest pain, worsening SOB, DOE, wheezing, orthopnea, PND, worsening LE edema, palpitations, dizziness or syncope.  Pt denies neurological change such as new headache, facial or extremity weakness.  Pt denies polydipsia, polyuria, or low sugar symptoms. Pt states overall good compliance with treatment and medications, good tolerability, and has been trying to follow appropriate diet.  Pt denies worsening depressive symptoms, suicidal ideation or panic. No fever, night sweats, wt loss, loss of appetite, or other constitutional symptoms.  Pt states good ability with ADL's, has low fall risk, home safety reviewed and adequate, no other significant changes in hearing or vision, and occasionally active with exercise.  Has some ED symptoms worsening over the past yr and states unable to have relations with wife for over a yr.  Unable to take PDE5's due to tikosyn, asking for urology referral Has lost a few lbs, but pt feels 202 wts were related to heavy boots those days. Wt Readings from Last 3 Encounters:  09/08/17 183 lb (83 kg)  07/18/17 202 lb (91.6 kg)  07/14/17 202 lb (91.6 kg)   BP Readings from Last 3 Encounters:  09/08/17 120/78  07/18/17 120/78  07/14/17 108/76   Past Medical History:  Diagnosis Date  . Arthritis    "maybe in my hands" (10/31/2016)  . Atrial flutter ([place])   . CHEST PAIN-PRECORDIAL 05/25/2009  . ERECTILE DYSFUNCTION, ORGANIC 01/16/2010  . GERD (gastroesophageal reflux disease)   . HYPERLIPIDEMIA 12/15/2007  . HYPERTENSION 12/15/2007  . HYPOKALEMIA 12/15/2007  . Internal hemorrhoids   . Persistent atrial fibrillation ([place])    a. prior ablation/flecainide; placed on Tikosyn 07/2017.  Marland Kitchen POSTURAL LIGHTHEADEDNESS 07/06/2008  . Sinus bradycardia   . Visit for monitoring Tikosyn therapy 07/03/2017   Past Surgical  History:  Procedure Laterality Date  . ATRIAL FIBRILLATION ABLATION N/A 08/13/2011   Procedure: ATRIAL FIBRILLATION ABLATION;  Surgeon: Thompson Grayer, MD;  Location: [facility] CATH LAB;  Service: Cardiovascular;  Laterality: N/A;  . ATRIAL FIBRILLATION ABLATION  10/31/2016  . ATRIAL FIBRILLATION ABLATION N/A 10/31/2016   Procedure: Atrial Fibrillation Ablation;  Surgeon: Thompson Grayer, MD;  Location: [place] CV LAB;  Service: Cardiovascular;  Laterality: N/A;  . ATRIAL FLUTTER ABLATION N/A 12/27/2011   CTI repeat ablation by Dr Rayann Heman  . CARDIOVERSION  11/21/2011   Procedure: CARDIOVERSION;  Surgeon: Larey Dresser, MD;  Location: [place];  Service: Cardiovascular;  Laterality: N/A;  . CARDIOVERSION N/A 12/02/2016   Procedure: CARDIOVERSION;  Surgeon: [name], MD;  Location: [place];  Service: Cardiovascular;  Laterality: N/A;  . CHALAZION EXCISION Right   . ELBOW SURGERY Right early 1980s   bone spur/chip; "opened me up"  . INGUINAL HERNIA REPAIR Right 11/2006  . TEE WITHOUT CARDIOVERSION  08/12/2011   Procedure: TRANSESOPHAGEAL ECHOCARDIOGRAM (TEE);  Surgeon: Fay Records, MD;  Location: [facility] ENDOSCOPY;  Service: Cardiovascular;  Laterality: N/A;  . TEE WITHOUT CARDIOVERSION N/A 10/30/2016   Procedure: TRANSESOPHAGEAL ECHOCARDIOGRAM (TEE);  Surgeon: Josue Hector, MD;  Location: [facility] ENDOSCOPY;  Service: Cardiovascular;  Laterality: N/A;  . TEE WITHOUT CARDIOVERSION N/A 12/02/2016   Procedure: TRANSESOPHAGEAL ECHOCARDIOGRAM (TEE);  Surgeon: [name], MD;  Location: [facility] ENDOSCOPY;  Service: Cardiovascular;  Laterality: N/A;    reports that he quit smoking about 6 years ago. His smoking  use included cigarettes. He has a 1.50 pack-year smoking history. He has never used smokeless tobacco. He reports that he drinks alcohol. He reports that he does not use drugs. family history includes Heart disease in his sister; Other in his father; Sleep apnea in his sister; Stroke in his mother;  Thyroid disease in his sister. Allergies  Allergen Reactions  . Ibuprofen Palpitations   Current Outpatient Medications on File Prior to Visit  Medication Sig Dispense Refill  . diltiazem (CARDIZEM) 30 MG tablet Take 30 mg by mouth as needed (afib).     . dofetilide (TIKOSYN) 250 MCG capsule Take 1 capsule (250 mcg total) by mouth 2 (two) times daily. (See paper prescriptions from Dr. Lovena Le)    . warfarin (COUMADIN) 5 MG tablet Take as directed by coumadin clinic 150 tablet 0   No current facility-administered medications on file prior to visit.    Review of Systems Constitutional: Negative for other unusual diaphoresis, sweats, appetite or weight changes HENT: Negative for other worsening hearing loss, ear pain, facial swelling, mouth sores or neck stiffness.   Eyes: Negative for other worsening pain, redness or other visual disturbance.  Respiratory: Negative for other stridor or swelling Cardiovascular: Negative for other palpitations or other chest pain  Gastrointestinal: Negative for worsening diarrhea or loose stools, blood in stool, distention or other pain Genitourinary: Negative for hematuria, flank pain or other change in urine volume.  Musculoskeletal: Negative for myalgias or other joint swelling.  Skin: Negative for other color change, or other wound or worsening drainage.  Neurological: Negative for other syncope or numbness. Hematological: Negative for other adenopathy or swelling Psychiatric/Behavioral: Negative for hallucinations, other worsening agitation, SI, self-injury, or new decreased concentration All other system neg per pt    Objective:   Physical Exam BP 120/78   Pulse 63   Temp 98 F (36.7 C) (Oral)   Ht 6' (1.829 m)   Wt 183 lb (83 kg)   SpO2 96%   BMI 24.82 kg/m  VS noted,  Constitutional: Pt is oriented to person, place, and time. Appears well-developed and well-nourished, in no significant distress and comfortable Head: Normocephalic and  atraumatic  Eyes: Conjunctivae and EOM are normal. Pupils are equal, round, and reactive to light Right Ear: External ear normal without discharge Left Ear: External ear normal without discharge Nose: Nose without discharge or deformity Mouth/Throat: Oropharynx is without other ulcerations and moist  Neck: Normal range of motion. Neck supple. No JVD present. No tracheal deviation present or significant neck LA or mass Cardiovascular: Normal rate, regular rhythm, normal heart sounds and intact distal pulses.   Pulmonary/Chest: WOB normal and breath sounds without rales or wheezing  Abdominal: Soft. Bowel sounds are normal. NT. No HSM  Musculoskeletal: Normal range of motion. Exhibits no edema Lymphadenopathy: Has no other cervical adenopathy.  Neurological: Pt is alert and oriented to person, place, and time. Pt has normal reflexes. No cranial nerve deficit. Motor grossly intact, Gait intact Skin: Skin is warm and dry. No rash noted or new ulcerations Psychiatric:  Has normal mood and affect. Behavior is normal without agitation No other exam findings    Assessment & Plan:

## 2017-09-08 NOTE — Assessment & Plan Note (Signed)

## 2017-09-24 ENCOUNTER — Ambulatory Visit (INDEPENDENT_AMBULATORY_CARE_PROVIDER_SITE_OTHER): Payer: BLUE CROSS/BLUE SHIELD | Admitting: *Deleted

## 2017-09-24 DIAGNOSIS — I481 Persistent atrial fibrillation: Secondary | ICD-10-CM

## 2017-09-24 DIAGNOSIS — I4891 Unspecified atrial fibrillation: Secondary | ICD-10-CM

## 2017-09-24 DIAGNOSIS — Z5181 Encounter for therapeutic drug level monitoring: Secondary | ICD-10-CM | POA: Diagnosis not present

## 2017-09-24 DIAGNOSIS — I4819 Other persistent atrial fibrillation: Secondary | ICD-10-CM

## 2017-09-24 LAB — POCT INR: INR: 2.5

## 2017-09-24 NOTE — Patient Instructions (Signed)
Description   Continue taking 7.5mg daily except 5mg on Mondays and Fridays.  Recheck INR in 4 weeks.  Call us with any medication changes # [phone]     

## 2017-10-16 ENCOUNTER — Telehealth: Payer: Self-pay

## 2017-10-16 NOTE — Telephone Encounter (Signed)
SPOKE WITH DENTIST OFFICE. PT IS GETTING OSSEOUS SURGERY AND BONE GRAFT,   EXTRACTION OF TOOTH  UPPER LEFT AREA.

## 2017-10-16 NOTE — Telephone Encounter (Signed)
ERROR

## 2017-10-16 NOTE — Telephone Encounter (Signed)
   [facility] Pre-operative Risk Assessment    Request for surgical clearance:  1. What type of surgery is being performed? Periodontal     2. When is this surgery scheduled? Pending   3. What type of clearance is required (medical clearance vs. Pharmacy clearance to hold med vs. Both)? both   4. Are there any medications that need to be held prior to surgery and how long? Coumadin, not listed    5. Practice name and name of physician performing surgery? Irineo Axon, Physician not listed   6. What is your office phone number? [phone]    7.   What is your office fax number? [phone]  8.   Anesthesia type (None, local, MAC, general) ? local   Joaquim Lai 10/16/2017, 11:39 AM  _________________________________________________________________   (provider comments below)

## 2017-10-20 ENCOUNTER — Other Ambulatory Visit: Payer: Self-pay | Admitting: Internal Medicine

## 2017-10-21 NOTE — Telephone Encounter (Signed)
   Primary Cardiologist: Thompson Grayer, MD  Chart reviewed as part of pre-operative protocol coverage. Patient was contacted 10/21/2017 in reference to pre-operative risk assessment for pending surgery as outlined below.  [name] [name] was last seen on 07/14/2017 by Roderic Palau, NP.  Since that day, [name] [name] has done well. He is treated for afib with Tikosyn. He has only had 3 episodes of breakthrough afib that were quickly controlled with his prn diltiazem since his appointment in February. He is feeling well with no new cardiac complaints.   Therefore, based on ACC/AHA guidelines, the patient would be at acceptable risk for the planned procedure without further cardiovascular testing.   Per our pharmacist: CHADS2-VASc score of  1 (, HTN, )  CrCl 82.9 Platelet count 200  Per office protocol, patient can hold warfarin for 5 days prior to procedure.   Patient will not need bridging with Lovenox (enoxaparin) around procedure.  Patient should restart warfarin on the evening of procedure or day after, at discretion of procedure MD  I will route this recommendation to the requesting party via [place] fax function and remove from pre-op pool.  Please call with questions.  [name] Perch, NP 10/21/2017, 2:48 PM

## 2017-10-21 NOTE — Telephone Encounter (Signed)
Patient with diagnosis of atrial fibrillation on warfarin for anticoagulation.    Procedure: osseous surgery with bone graft  Date of procedure: TBD  CHADS2-VASc score of  1 (, HTN, )  CrCl 82.9 Platelet count 200  Per office protocol, patient can hold warfarin for 5 days prior to procedure.   Patient will not need bridging with Lovenox (enoxaparin) around procedure.  Patient should restart warfarin on the evening of procedure or day after, at discretion of procedure MD

## 2017-10-22 ENCOUNTER — Ambulatory Visit (INDEPENDENT_AMBULATORY_CARE_PROVIDER_SITE_OTHER): Payer: BLUE CROSS/BLUE SHIELD | Admitting: *Deleted

## 2017-10-22 DIAGNOSIS — Z5181 Encounter for therapeutic drug level monitoring: Secondary | ICD-10-CM

## 2017-10-22 DIAGNOSIS — I481 Persistent atrial fibrillation: Secondary | ICD-10-CM

## 2017-10-22 DIAGNOSIS — I4891 Unspecified atrial fibrillation: Secondary | ICD-10-CM

## 2017-10-22 DIAGNOSIS — N5201 Erectile dysfunction due to arterial insufficiency: Secondary | ICD-10-CM | POA: Diagnosis not present

## 2017-10-22 DIAGNOSIS — N401 Enlarged prostate with lower urinary tract symptoms: Secondary | ICD-10-CM | POA: Diagnosis not present

## 2017-10-22 DIAGNOSIS — R351 Nocturia: Secondary | ICD-10-CM | POA: Diagnosis not present

## 2017-10-22 DIAGNOSIS — I4819 Other persistent atrial fibrillation: Secondary | ICD-10-CM

## 2017-10-22 LAB — POCT INR: INR: 3 (ref 2.0–3.0)

## 2017-10-22 NOTE — Patient Instructions (Signed)
Description   Continue taking 7.5mg daily except 5mg on Mondays and Fridays.  Recheck INR in 4 weeks.  Call us with any medication changes # [phone]     

## 2017-10-28 IMAGING — CR DG ELBOW COMPLETE 3+V*L*
4 series · 4 of 4 positions shown · non-contrast
Comparison: None.

CLINICAL DATA: Trauma.  Injury left.

EXAM:
LEFT ELBOW - COMPLETE 3+ VIEW

[view not recorded (1 of 4)]
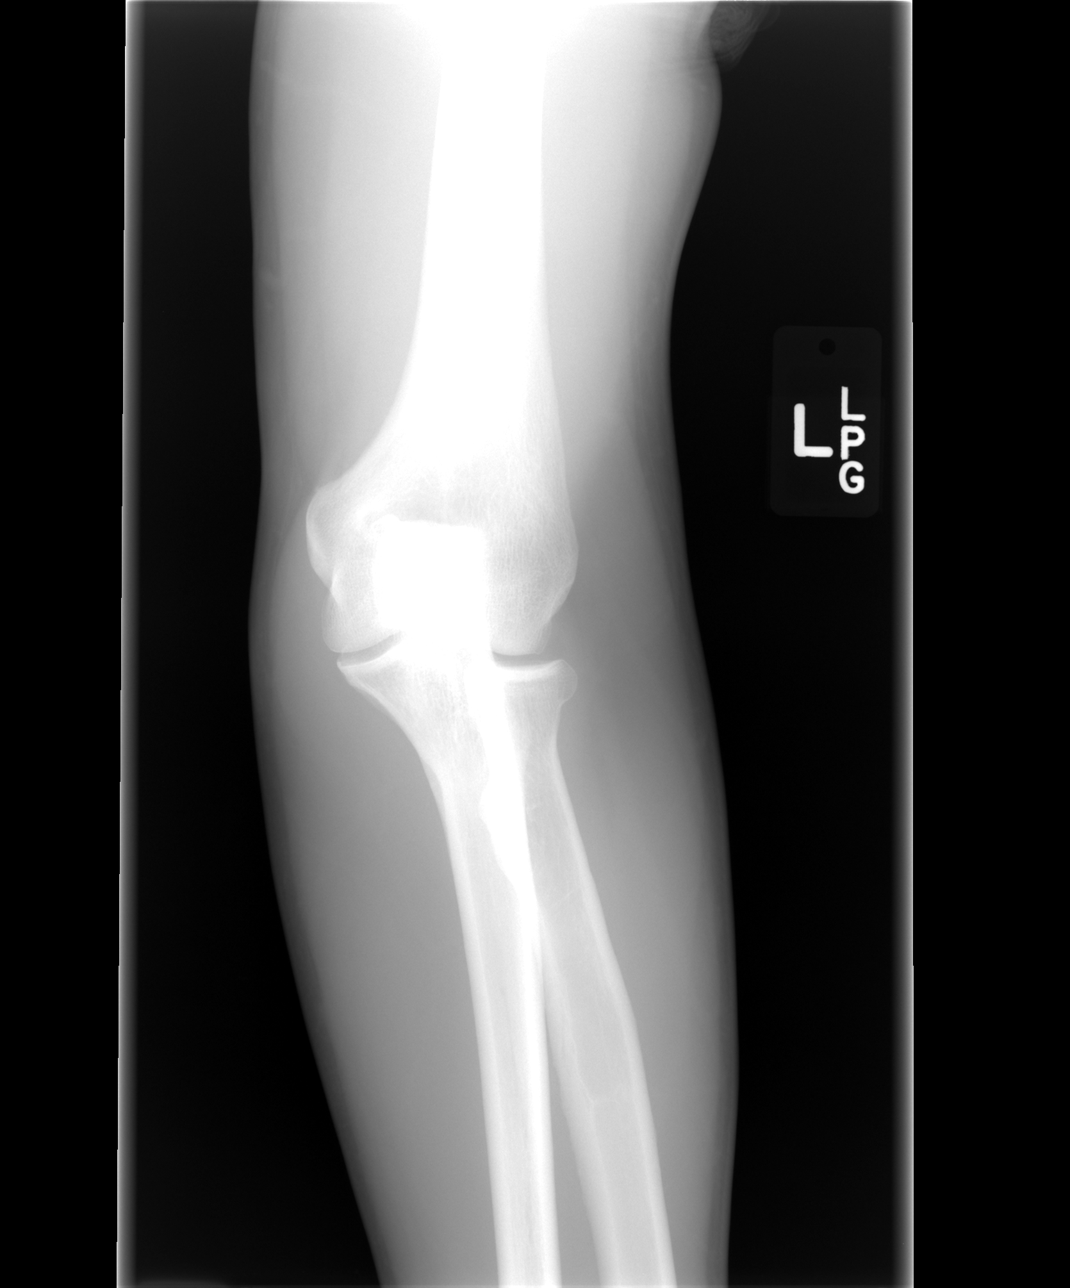

[view not recorded (2 of 4)]
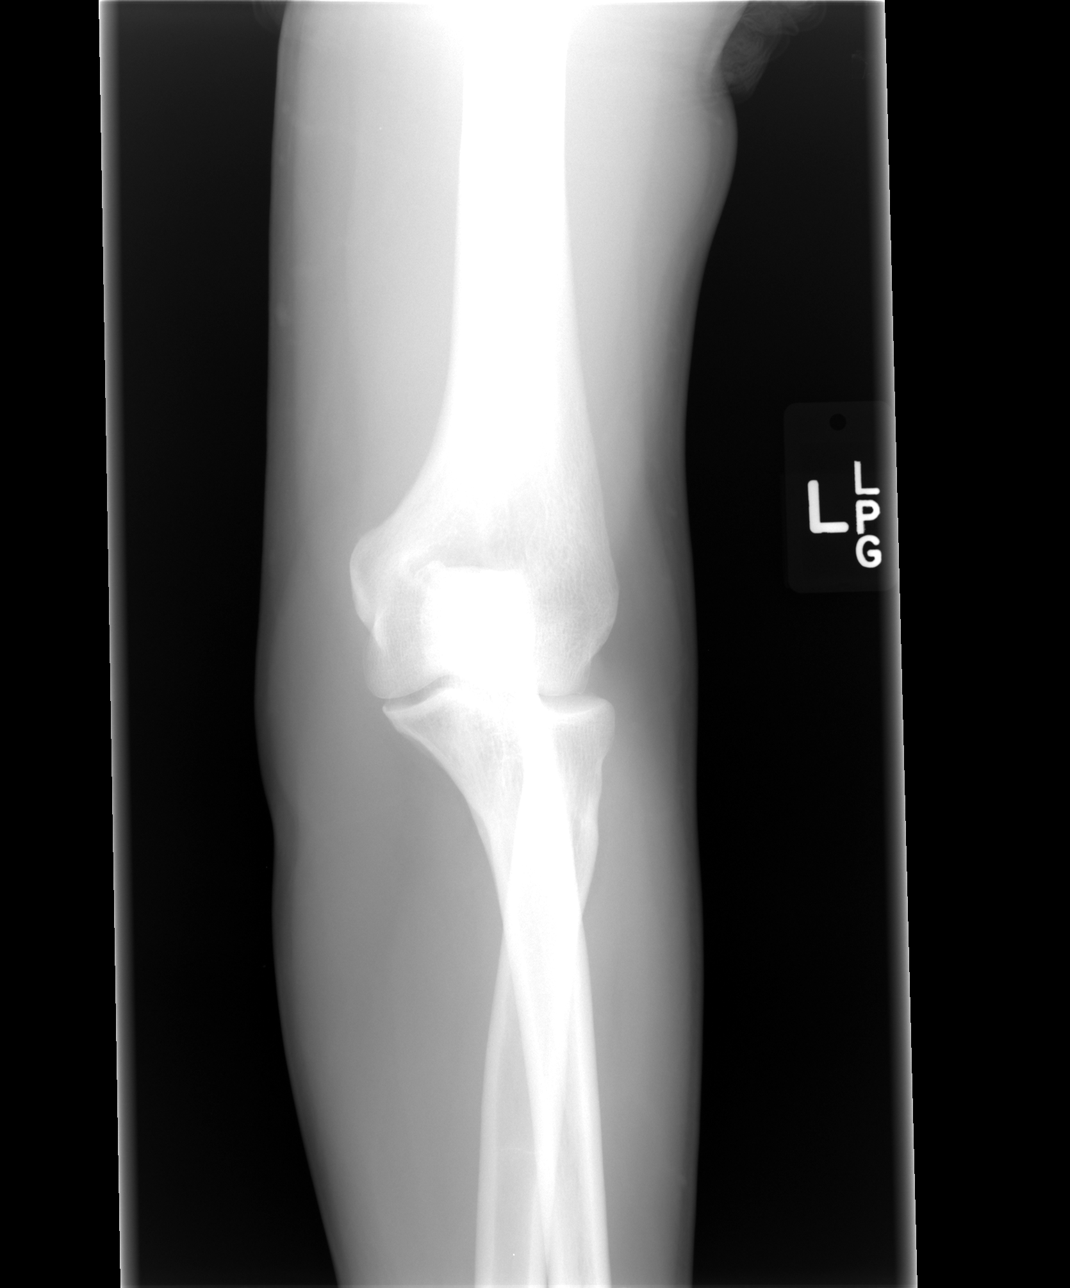

[view not recorded (3 of 4)]
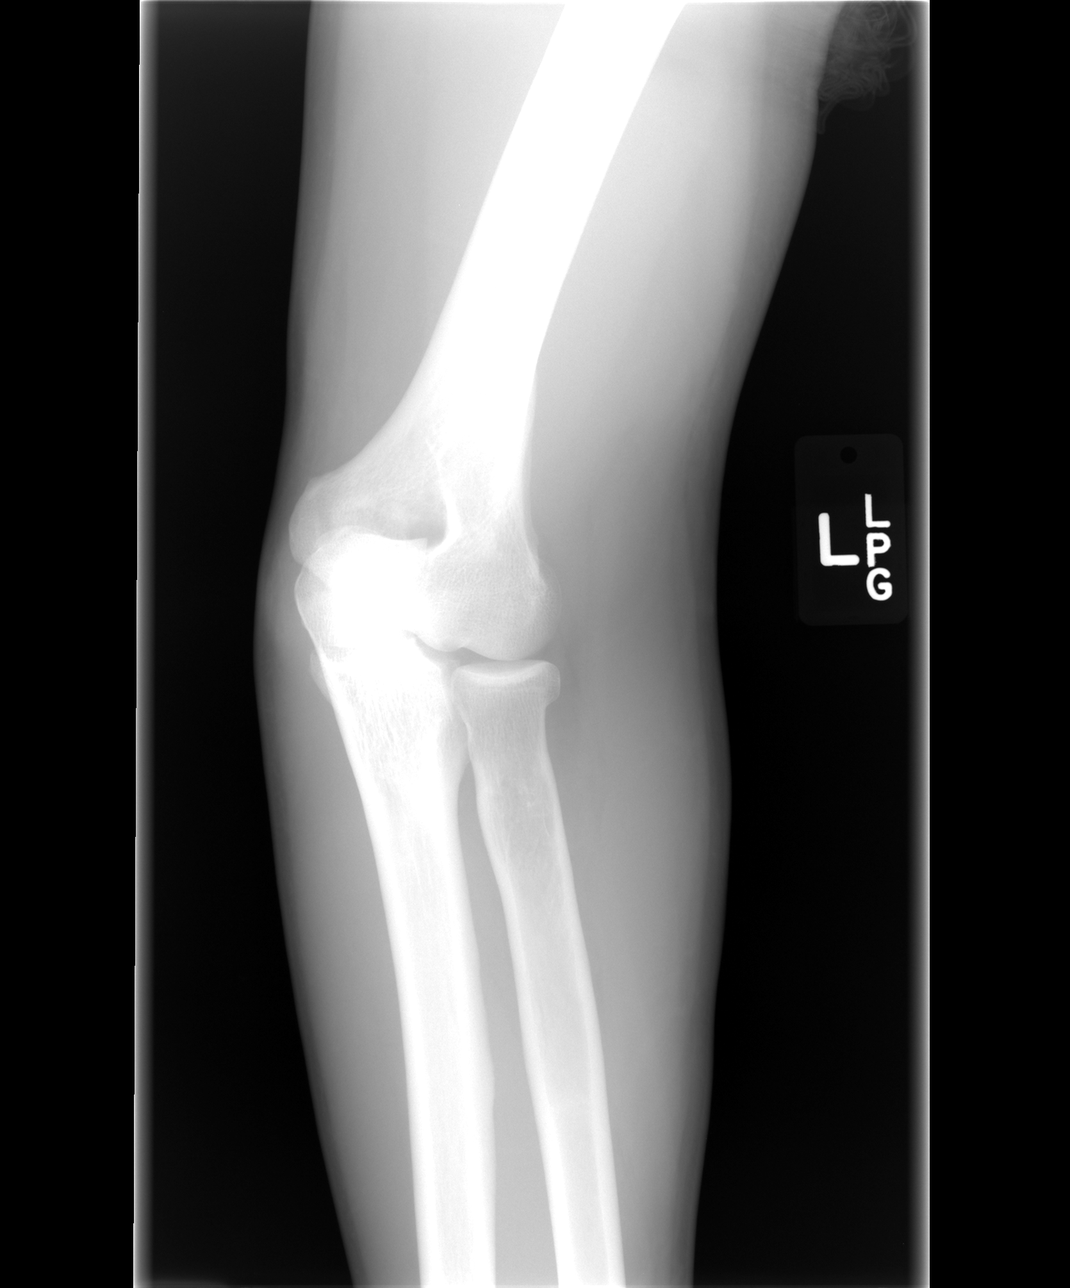

[view not recorded (4 of 4)]
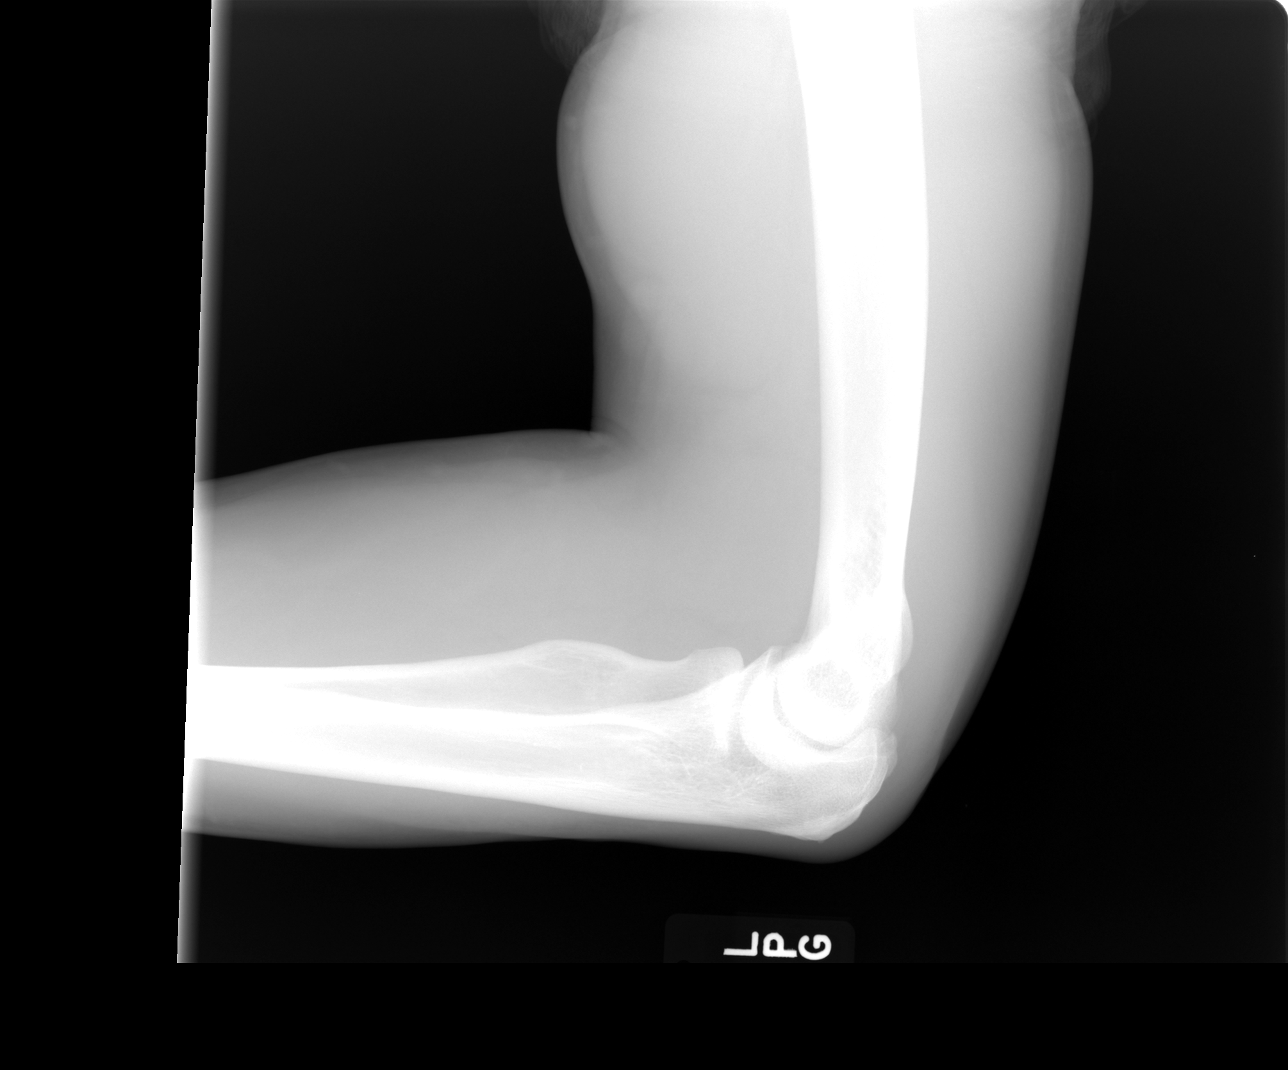

[4 of 4 positions shown; findings below may reference images not displayed]

FINDINGS: There is no evidence of fracture, dislocation, or joint effusion.
There is no evidence of arthropathy or other focal bone abnormality.
Soft tissues are unremarkable.
IMPRESSION: Negative.

## 2017-11-05 ENCOUNTER — Other Ambulatory Visit: Payer: Self-pay | Admitting: Internal Medicine

## 2017-11-19 ENCOUNTER — Ambulatory Visit (INDEPENDENT_AMBULATORY_CARE_PROVIDER_SITE_OTHER): Payer: BLUE CROSS/BLUE SHIELD | Admitting: Internal Medicine

## 2017-11-19 ENCOUNTER — Encounter: Payer: Self-pay | Admitting: Internal Medicine

## 2017-11-19 ENCOUNTER — Ambulatory Visit (INDEPENDENT_AMBULATORY_CARE_PROVIDER_SITE_OTHER): Payer: BLUE CROSS/BLUE SHIELD | Admitting: *Deleted

## 2017-11-19 VITALS — BP 118/64 | HR 89 | Ht 72.0 in | Wt 202.0 lb

## 2017-11-19 DIAGNOSIS — I484 Atypical atrial flutter: Secondary | ICD-10-CM | POA: Diagnosis not present

## 2017-11-19 DIAGNOSIS — I4819 Other persistent atrial fibrillation: Secondary | ICD-10-CM

## 2017-11-19 DIAGNOSIS — Z5181 Encounter for therapeutic drug level monitoring: Secondary | ICD-10-CM

## 2017-11-19 DIAGNOSIS — I481 Persistent atrial fibrillation: Secondary | ICD-10-CM

## 2017-11-19 DIAGNOSIS — I4891 Unspecified atrial fibrillation: Secondary | ICD-10-CM

## 2017-11-19 DIAGNOSIS — I10 Essential (primary) hypertension: Secondary | ICD-10-CM | POA: Diagnosis not present

## 2017-11-19 LAB — POCT INR: INR: 3.5 — AB (ref 2.0–3.0)

## 2017-11-19 NOTE — Patient Instructions (Addendum)
Medication Instructions:  Your physician recommends that you continue on your current medications as directed. Please refer to the Current Medication list given to you today.  Labwork: None ordered.  Testing/Procedures: Come to the [facility] in one week for a nurse visit for an EKG. --If you are still in afib at this visit I will call you with further instructions--  Follow-Up: Your physician wants you to follow-up in: 3 months with Roderic Palau at the [facility] clinic.  Any Other Special Instructions Will Be Listed Below (If Applicable).  If you need a refill on your cardiac medications before your next appointment, please call your pharmacy.

## 2017-11-19 NOTE — Patient Instructions (Signed)
Description   Skip today's dose, then Continue taking 7.5mg  daily except 5mg  on Mondays and Fridays.  Recheck INR in 2 weeks. Increase your leafy green intake to 4 servings each week.  Call us with any medication changes # [phone]

## 2017-11-19 NOTE — Progress Notes (Signed)
PCP: Biagio Borg, MD   Primary EP: Dr Hart Carwin is a 57 y.o. male who presents today for routine electrophysiology followup.  Since last being seen in our clinic, the patient reports doing very well.  He reports being unaware of his afib over the past few months.  Thinks that he may have converted to afib last night due to mild palpitations.  Today, he denies symptoms of chest pain, shortness of breath,  lower extremity edema, dizziness, presyncope, or syncope.  The patient is otherwise without complaint today.   Past Medical History:  Diagnosis Date  . Arthritis    "maybe in my hands" (10/31/2016)  . Atrial flutter ([place])   . CHEST PAIN-PRECORDIAL 05/25/2009  . ERECTILE DYSFUNCTION, ORGANIC 01/16/2010  . GERD (gastroesophageal reflux disease)   . HYPERLIPIDEMIA 12/15/2007  . HYPERTENSION 12/15/2007  . HYPOKALEMIA 12/15/2007  . Internal hemorrhoids   . Persistent atrial fibrillation ([place])    a. prior ablation/flecainide; placed on Tikosyn 07/2017.  Marland Kitchen POSTURAL LIGHTHEADEDNESS 07/06/2008  . Sinus bradycardia   . Visit for monitoring Tikosyn therapy 07/03/2017   Past Surgical History:  Procedure Laterality Date  . ATRIAL FIBRILLATION ABLATION N/A 08/13/2011   Procedure: ATRIAL FIBRILLATION ABLATION;  Surgeon: Thompson Grayer, MD;  Location: [facility] CATH LAB;  Service: Cardiovascular;  Laterality: N/A;  . ATRIAL FIBRILLATION ABLATION  10/31/2016  . ATRIAL FIBRILLATION ABLATION N/A 10/31/2016   Procedure: Atrial Fibrillation Ablation;  Surgeon: Thompson Grayer, MD;  Location: [place] CV LAB;  Service: Cardiovascular;  Laterality: N/A;  . ATRIAL FLUTTER ABLATION N/A 12/27/2011   CTI repeat ablation by Dr Rayann Heman  . CARDIOVERSION  11/21/2011   Procedure: CARDIOVERSION;  Surgeon: Larey Dresser, MD;  Location: [place];  Service: Cardiovascular;  Laterality: N/A;  . CARDIOVERSION N/A 12/02/2016   Procedure: CARDIOVERSION;  Surgeon: [name], MD;  Location: [place];  Service:  Cardiovascular;  Laterality: N/A;  . CHALAZION EXCISION Right   . ELBOW SURGERY Right early 1980s   bone spur/chip; "opened me up"  . INGUINAL HERNIA REPAIR Right 11/2006  . TEE WITHOUT CARDIOVERSION  08/12/2011   Procedure: TRANSESOPHAGEAL ECHOCARDIOGRAM (TEE);  Surgeon: Fay Records, MD;  Location: [facility] ENDOSCOPY;  Service: Cardiovascular;  Laterality: N/A;  . TEE WITHOUT CARDIOVERSION N/A 10/30/2016   Procedure: TRANSESOPHAGEAL ECHOCARDIOGRAM (TEE);  Surgeon: Josue Hector, MD;  Location: [facility] ENDOSCOPY;  Service: Cardiovascular;  Laterality: N/A;  . TEE WITHOUT CARDIOVERSION N/A 12/02/2016   Procedure: TRANSESOPHAGEAL ECHOCARDIOGRAM (TEE);  Surgeon: [name], MD;  Location: [facility] ENDOSCOPY;  Service: Cardiovascular;  Laterality: N/A;    ROS- all systems are reviewed and negatives except as per HPI above  Current Outpatient Medications  Medication Sig Dispense Refill  . amLODipine (NORVASC) 2.5 MG tablet Take 1 tablet (2.5 mg total) by mouth daily. 90 tablet 3  . atorvastatin (LIPITOR) 20 MG tablet Take 1 tablet (20 mg total) by mouth daily. 90 tablet 3  . diltiazem (CARDIZEM CD) 120 MG 24 hr capsule Take 1 capsule (120 mg total) by mouth daily. 30 capsule 3  . diltiazem (CARDIZEM) 30 MG tablet Take 30 mg by mouth as needed (afib).     . dofetilide (TIKOSYN) 250 MCG capsule Take 1 capsule (250 mcg total) by mouth 2 (two) times daily. 60 capsule 6  . doxazosin (CARDURA) 4 MG tablet Take 1 tablet (4 mg total) by mouth at bedtime. 90 tablet 3  . irbesartan (AVAPRO) 300 MG tablet TAKE 1 TABLET  BY MOUTH EVERY DAY IN THE MORNING 90 tablet 3  . warfarin (COUMADIN) 5 MG tablet Take as directed by coumadin clinic 150 tablet 0   No current facility-administered medications for this visit.     Physical Exam: Vitals:   11/19/17 0933  BP: 118/64  Pulse: 89  Weight: 202 lb (91.6 kg)  Height: 6' (1.829 m)    GEN- The patient is well appearing, alert and oriented x 3 today.   Head-  normocephalic, atraumatic Eyes-  Sclera clear, conjunctiva pink Ears- hearing intact Oropharynx- clear Lungs- Clear to ausculation bilaterally, normal work of breathing Heart- irregular rate and rhythm, no murmurs, rubs or gallops, PMI not laterally displaced GI- soft, NT, ND, + BS Extremities- no clubbing, cyanosis, or edema  Wt Readings from Last 3 Encounters:  11/19/17 202 lb (91.6 kg)  09/08/17 183 lb (83 kg)  07/18/17 202 lb (91.6 kg)    EKG tracing ordered today is personally reviewed and shows afib, V rate 89 bpm, QTc 469 msec  Assessment and Plan:  1. Persistent afib Continues to have afib despite tikosyn.  He has done reasonably well with Phyllis Ginger He has had 3 prior ablations Return in 1 week for ekg.  If in sinus, would continue current management. If back in afib, could consider cardioversion and continued tikosyn vs rate control long term On coumadin  2. HTN Stable No change required today  Follow-up in AF clinic every 3 months  Thompson Grayer MD, [facility] 11/19/2017 10:06 AM

## 2017-11-20 DIAGNOSIS — N5201 Erectile dysfunction due to arterial insufficiency: Secondary | ICD-10-CM | POA: Diagnosis not present

## 2017-11-27 ENCOUNTER — Other Ambulatory Visit (HOSPITAL_COMMUNITY): Payer: Self-pay | Admitting: Nurse Practitioner

## 2017-11-27 ENCOUNTER — Ambulatory Visit (INDEPENDENT_AMBULATORY_CARE_PROVIDER_SITE_OTHER): Payer: BLUE CROSS/BLUE SHIELD

## 2017-11-27 ENCOUNTER — Other Ambulatory Visit: Payer: Self-pay | Admitting: Internal Medicine

## 2017-11-27 VITALS — BP 110/70 | HR 53 | Wt 201.0 lb

## 2017-11-27 DIAGNOSIS — I481 Persistent atrial fibrillation: Secondary | ICD-10-CM | POA: Diagnosis not present

## 2017-11-27 DIAGNOSIS — I4819 Other persistent atrial fibrillation: Secondary | ICD-10-CM

## 2017-11-27 NOTE — Progress Notes (Signed)
1.) Reason for visit: EKG per Dr. Rayann Heman  2.) Name of MD requesting visit: Dr. Rayann Heman  3.) Vital Signs Stable  4. ) Pt denies palpitations and reports he is feeling well.   4.) Assessment and plan per MD:   EKG given to Dr. Jackalyn Lombard nurse and reviewed and pt to follow up with Dr. Rayann Heman. Willeen Cass RN to call pt.

## 2017-11-27 NOTE — Patient Instructions (Signed)
Medication Instructions: No changes   Labwork: None ordered  Testing/Procedures: None Ordered  Follow-Up: per Dr. Rayann Heman [name] [name] to call you with follow visit   Any Other Special Instructions Will Be Listed Below (If Applicable).      If you need a refill on your cardiac medications before your next appointment, please call your pharmacy.

## 2017-12-02 ENCOUNTER — Ambulatory Visit (INDEPENDENT_AMBULATORY_CARE_PROVIDER_SITE_OTHER): Payer: BLUE CROSS/BLUE SHIELD | Admitting: *Deleted

## 2017-12-02 DIAGNOSIS — I481 Persistent atrial fibrillation: Secondary | ICD-10-CM | POA: Diagnosis not present

## 2017-12-02 DIAGNOSIS — I4819 Other persistent atrial fibrillation: Secondary | ICD-10-CM

## 2017-12-02 DIAGNOSIS — Z5181 Encounter for therapeutic drug level monitoring: Secondary | ICD-10-CM

## 2017-12-02 DIAGNOSIS — I4891 Unspecified atrial fibrillation: Secondary | ICD-10-CM | POA: Diagnosis not present

## 2017-12-02 LAB — POCT INR: INR: 2.2 (ref 2.0–3.0)

## 2017-12-02 NOTE — Patient Instructions (Signed)
Description   Continue taking 7.5mg  daily except 5mg  on Mondays and Fridays.  Recheck INR in 3 weeks. Increase your leafy green intake to 4 servings each week.  Call us with any medication changes # [phone]

## 2017-12-20 ENCOUNTER — Other Ambulatory Visit: Payer: Self-pay | Admitting: Internal Medicine

## 2017-12-22 ENCOUNTER — Telehealth: Payer: Self-pay | Admitting: Internal Medicine

## 2017-12-22 MED ORDER — IRBESARTAN 300 MG PO TABS: ORAL_TABLET | ORAL | 1 refills | Status: DC

## 2017-12-22 NOTE — Telephone Encounter (Signed)
Copied from [place] [id number]. Topic: Quick Communication - See Telephone Encounter >> Dec 22, 2017  1:21 PM Bea Graff, NT wrote: CRM for notification. See Telephone encounter for: 12/22/17. Pt states that [facility] [address] [facility] not have the irbesartan (AVAPRO) 300 MG tablet in stock and he is wanting to see if the rx can be sent to [place] [facility]? He is out of Medication. He is requesting a 90 day supply.

## 2017-12-23 ENCOUNTER — Ambulatory Visit (INDEPENDENT_AMBULATORY_CARE_PROVIDER_SITE_OTHER): Payer: BLUE CROSS/BLUE SHIELD

## 2017-12-23 DIAGNOSIS — Z5181 Encounter for therapeutic drug level monitoring: Secondary | ICD-10-CM

## 2017-12-23 DIAGNOSIS — I481 Persistent atrial fibrillation: Secondary | ICD-10-CM | POA: Diagnosis not present

## 2017-12-23 DIAGNOSIS — I4891 Unspecified atrial fibrillation: Secondary | ICD-10-CM | POA: Diagnosis not present

## 2017-12-23 DIAGNOSIS — I4819 Other persistent atrial fibrillation: Secondary | ICD-10-CM

## 2017-12-23 LAB — POCT INR: INR: 3.4 — AB (ref 2.0–3.0)

## 2017-12-23 NOTE — Patient Instructions (Signed)
Description   Skip today's dosage of Coumadin, then resume same dosage 7.5mg  daily except 5mg  on Mondays and Fridays.  Recheck INR in 3 weeks.  Call us with any medication changes # [phone]

## 2018-01-13 ENCOUNTER — Ambulatory Visit (INDEPENDENT_AMBULATORY_CARE_PROVIDER_SITE_OTHER): Payer: BLUE CROSS/BLUE SHIELD

## 2018-01-13 DIAGNOSIS — Z5181 Encounter for therapeutic drug level monitoring: Secondary | ICD-10-CM

## 2018-01-13 DIAGNOSIS — I4891 Unspecified atrial fibrillation: Secondary | ICD-10-CM | POA: Diagnosis not present

## 2018-01-13 DIAGNOSIS — I481 Persistent atrial fibrillation: Secondary | ICD-10-CM | POA: Diagnosis not present

## 2018-01-13 DIAGNOSIS — I4819 Other persistent atrial fibrillation: Secondary | ICD-10-CM

## 2018-01-13 LAB — POCT INR: INR: 2.5 (ref 2.0–3.0)

## 2018-01-13 NOTE — Patient Instructions (Signed)
Please continue same dosage 7.5mg  daily except 5mg  on Mondays and Fridays.  Recheck INR in 4  weeks.  Call us with any medication changes # [phone]

## 2018-02-01 ENCOUNTER — Other Ambulatory Visit: Payer: Self-pay | Admitting: Internal Medicine

## 2018-02-10 ENCOUNTER — Ambulatory Visit (INDEPENDENT_AMBULATORY_CARE_PROVIDER_SITE_OTHER): Payer: BLUE CROSS/BLUE SHIELD

## 2018-02-10 DIAGNOSIS — I4891 Unspecified atrial fibrillation: Secondary | ICD-10-CM

## 2018-02-10 DIAGNOSIS — Z5181 Encounter for therapeutic drug level monitoring: Secondary | ICD-10-CM

## 2018-02-10 DIAGNOSIS — I4819 Other persistent atrial fibrillation: Secondary | ICD-10-CM

## 2018-02-10 DIAGNOSIS — I481 Persistent atrial fibrillation: Secondary | ICD-10-CM

## 2018-02-10 LAB — POCT INR: INR: 3.9 — AB (ref 2.0–3.0)

## 2018-02-10 NOTE — Patient Instructions (Signed)
Please skip pill tonight, continue same dosage 7.5mg  daily except 5mg  on Mondays and Fridays.  Recheck INR in 4 weeks.  Call us with any medication changes # [phone]

## 2018-02-19 ENCOUNTER — Ambulatory Visit (HOSPITAL_COMMUNITY)
Admission: RE | Admit: 2018-02-19 | Discharge: 2018-02-19 | Disposition: A | Discharge status: Home / Self Care | Payer: BLUE CROSS/BLUE SHIELD | Source: Ambulatory Visit | Attending: Nurse Practitioner | Admitting: Nurse Practitioner

## 2018-02-19 ENCOUNTER — Encounter (HOSPITAL_COMMUNITY): Payer: Self-pay | Admitting: Nurse Practitioner

## 2018-02-19 VITALS — BP 136/88 | HR 84

## 2018-02-19 DIAGNOSIS — I4892 Unspecified atrial flutter: Secondary | ICD-10-CM | POA: Diagnosis not present

## 2018-02-19 DIAGNOSIS — E785 Hyperlipidemia, unspecified: Secondary | ICD-10-CM | POA: Diagnosis not present

## 2018-02-19 DIAGNOSIS — I481 Persistent atrial fibrillation: Secondary | ICD-10-CM | POA: Diagnosis not present

## 2018-02-19 DIAGNOSIS — I4891 Unspecified atrial fibrillation: Secondary | ICD-10-CM | POA: Diagnosis not present

## 2018-02-19 DIAGNOSIS — Z7901 Long term (current) use of anticoagulants: Secondary | ICD-10-CM | POA: Diagnosis not present

## 2018-02-19 DIAGNOSIS — Z79899 Other long term (current) drug therapy: Secondary | ICD-10-CM | POA: Diagnosis not present

## 2018-02-19 DIAGNOSIS — Z8249 Family history of ischemic heart disease and other diseases of the circulatory system: Secondary | ICD-10-CM | POA: Insufficient documentation

## 2018-02-19 DIAGNOSIS — K219 Gastro-esophageal reflux disease without esophagitis: Secondary | ICD-10-CM | POA: Diagnosis not present

## 2018-02-19 DIAGNOSIS — Z87891 Personal history of nicotine dependence: Secondary | ICD-10-CM | POA: Insufficient documentation

## 2018-02-19 DIAGNOSIS — Z886 Allergy status to analgesic agent status: Secondary | ICD-10-CM | POA: Diagnosis not present

## 2018-02-19 DIAGNOSIS — Z9889 Other specified postprocedural states: Secondary | ICD-10-CM | POA: Insufficient documentation

## 2018-02-19 DIAGNOSIS — I10 Essential (primary) hypertension: Secondary | ICD-10-CM | POA: Diagnosis not present

## 2018-02-19 NOTE — Progress Notes (Signed)
Primary Care Physician: Biagio Borg, MD Referring Physician: Dr. Hart Carwin is a 57 y.o. male with a h/o long standing h/o afib/flutter,  in the past failing flecainide and ablation x 3, first for flutter and the last two for fibrillation. He was then started on Tikosyn in January of this year. When he saw Dr. Rayann Heman in June he was out of rhythm but with repeat EKG , he had gone back to SR. He is out of rhythm today. He is not always aware when he is out of rhythm. He states he has had a very hectic am and did not sleep well last night.  Today, he denies symptoms of palpitations, chest pain, shortness of breath, orthopnea, PND, lower extremity edema, dizziness, presyncope, syncope, or neurologic sequela. The patient is tolerating medications without difficulties and is otherwise without complaint today.   Past Medical History:  Diagnosis Date  . Arthritis    "maybe in my hands" (10/31/2016)  . Atrial flutter ([place])   . CHEST PAIN-PRECORDIAL 05/25/2009  . ERECTILE DYSFUNCTION, ORGANIC 01/16/2010  . GERD (gastroesophageal reflux disease)   . HYPERLIPIDEMIA 12/15/2007  . HYPERTENSION 12/15/2007  . HYPOKALEMIA 12/15/2007  . Internal hemorrhoids   . Persistent atrial fibrillation ([place])    a. prior ablation/flecainide; placed on Tikosyn 07/2017.  Marland Kitchen POSTURAL LIGHTHEADEDNESS 07/06/2008  . Sinus bradycardia   . Visit for monitoring Tikosyn therapy 07/03/2017   Past Surgical History:  Procedure Laterality Date  . ATRIAL FIBRILLATION ABLATION N/A 08/13/2011   Procedure: ATRIAL FIBRILLATION ABLATION;  Surgeon: Thompson Grayer, MD;  Location: [facility] CATH LAB;  Service: Cardiovascular;  Laterality: N/A;  . ATRIAL FIBRILLATION ABLATION  10/31/2016  . ATRIAL FIBRILLATION ABLATION N/A 10/31/2016   Procedure: Atrial Fibrillation Ablation;  Surgeon: Thompson Grayer, MD;  Location: [place] CV LAB;  Service: Cardiovascular;  Laterality: N/A;  . ATRIAL FLUTTER ABLATION N/A 12/27/2011   CTI repeat  ablation by Dr Rayann Heman  . CARDIOVERSION  11/21/2011   Procedure: CARDIOVERSION;  Surgeon: Larey Dresser, MD;  Location: [place];  Service: Cardiovascular;  Laterality: N/A;  . CARDIOVERSION N/A 12/02/2016   Procedure: CARDIOVERSION;  Surgeon: [name], MD;  Location: [place];  Service: Cardiovascular;  Laterality: N/A;  . CHALAZION EXCISION Right   . ELBOW SURGERY Right early 1980s   bone spur/chip; "opened me up"  . INGUINAL HERNIA REPAIR Right 11/2006  . TEE WITHOUT CARDIOVERSION  08/12/2011   Procedure: TRANSESOPHAGEAL ECHOCARDIOGRAM (TEE);  Surgeon: Fay Records, MD;  Location: [facility] ENDOSCOPY;  Service: Cardiovascular;  Laterality: N/A;  . TEE WITHOUT CARDIOVERSION N/A 10/30/2016   Procedure: TRANSESOPHAGEAL ECHOCARDIOGRAM (TEE);  Surgeon: Josue Hector, MD;  Location: [facility] ENDOSCOPY;  Service: Cardiovascular;  Laterality: N/A;  . TEE WITHOUT CARDIOVERSION N/A 12/02/2016   Procedure: TRANSESOPHAGEAL ECHOCARDIOGRAM (TEE);  Surgeon: [name], MD;  Location: [facility] ENDOSCOPY;  Service: Cardiovascular;  Laterality: N/A;    Current Outpatient Medications  Medication Sig Dispense Refill  . amLODipine (NORVASC) 2.5 MG tablet Take 1 tablet (2.5 mg total) by mouth daily. 90 tablet 3  . atorvastatin (LIPITOR) 20 MG tablet Take 1 tablet (20 mg total) by mouth daily. 90 tablet 3  . diltiazem (CARDIZEM CD) 120 MG 24 hr capsule TAKE 1 CAPSULE BY MOUTH EVERY DAY 90 capsule 1  . diltiazem (CARDIZEM) 30 MG tablet Take 30 mg by mouth as needed (afib).     . dofetilide (TIKOSYN) 250 MCG capsule Take 1 capsule (250 mcg total)  by mouth 2 (two) times daily. 60 capsule 6  . doxazosin (CARDURA) 4 MG tablet Take 1 tablet (4 mg total) by mouth at bedtime. 90 tablet 3  . irbesartan (AVAPRO) 300 MG tablet TAKE 1 TABLET BY MOUTH EVERY DAY IN THE MORNING 30 tablet 8  . warfarin (COUMADIN) 5 MG tablet Take as directed by coumadin clinic 150 tablet 0   No current facility-administered medications for this  encounter.     Allergies  Allergen Reactions  . Ibuprofen Palpitations    Social History   Socioeconomic History  . Marital status: Married    Spouse name: Not on file  . Number of children: 2  . Years of education: Not on file  . Highest education level: Not on file  Occupational History  . Occupation: SECURITY    Employer: [employer]  . Financial resource strain: Not on file  . Food insecurity:    Worry: Not on file    Inability: Not on file  . Transportation needs:    Medical: Not on file    Non-medical: Not on file  Tobacco Use  . Smoking status: Former Smoker    Packs/day: 0.10    Years: 15.00    Pack years: 1.50    Types: Cigarettes    Last attempt to quit: 12/17/2010    Years since quitting: 7.1  . Smokeless tobacco: Never Used  Substance and Sexual Activity  . Alcohol use: Yes    Comment: "1 glass of wine/month maybe" (10/31/2016)  . Drug use: No  . Sexual activity: Not Currently  Lifestyle  . Physical activity:    Days per week: Not on file    Minutes per session: Not on file  . Stress: Not on file  Relationships  . Social connections:    Talks on phone: Not on file    Gets together: Not on file    Attends religious service: Not on file    Active member of club or organization: Not on file    Attends meetings of clubs or organizations: Not on file    Relationship status: Not on file  . Intimate partner violence:    Fear of current or ex partner: Not on file    Emotionally abused: Not on file    Physically abused: Not on file    Forced sexual activity: Not on file  Other Topics Concern  . Not on file  Social History Narrative   Pt lives in [place] with spouse. 2 grown children.   Works at [employer] in [place].    Family History  Problem Relation Age of Onset  . Stroke Mother   . Heart disease Sister   . Thyroid disease Sister   . Sleep apnea Sister   . Other Father        deceased stomach hemorrhage    . Anesthesia problems Neg Hx     ROS- All systems are reviewed and negative except as per the HPI above  Physical Exam: Vitals:   02/19/18 1041  BP: 136/88  Pulse: 84   Wt Readings from Last 3 Encounters:  11/27/17 91.2 kg  11/19/17 91.6 kg  09/08/17 83 kg    Labs: Lab Results  Component Value Date   NA 139 09/08/2017   K 3.8 09/08/2017   CL 107 09/08/2017   CO2 23 09/08/2017   GLUCOSE 95 09/08/2017   BUN 25 (H) 09/08/2017   CREATININE 1.14 09/08/2017   CALCIUM 9.4 09/08/2017  MG 1.9 07/14/2017   Lab Results  Component Value Date   INR 3.9 (A) 02/10/2018   Lab Results  Component Value Date   CHOL 171 09/08/2017   HDL 52.80 09/08/2017   LDLCALC 90 09/08/2017   TRIG 145.0 09/08/2017     GEN- The patient is well appearing, alert and oriented x 3 today.   Head- normocephalic, atraumatic Eyes-  Sclera clear, conjunctiva pink Ears- hearing intact Oropharynx- clear Neck- supple, no JVP Lymph- no cervical lymphadenopathy Lungs- Clear to ausculation bilaterally, normal work of breathing Heart- irregular rate and rhythm, no murmurs, rubs or gallops, PMI not laterally displaced GI- soft, NT, ND, + BS Extremities- no clubbing, cyanosis, or edema MS- no significant deformity or atrophy Skin- no rash or lesion Psych- euthymic mood, full affect Neuro- strength and sensation are intact  EKG- Afib  V rate at  84 bpm Epic records reviewed    Assessment and Plan: 1. Persistent afib Continues  to have afib despite tikosyn Return in one week, if sinus, continue meds, if afib/flutter would favor cardioversion with continued Tikosyn or rate control long term Continue tikosyn at 250 mcg bid Continue diltiazem at 120 mg qd,  can take 30 mg if HR becomes elevated Bmet/mag today   Recheck in one week  Butch Penny C. Ana Woodroof, [facility] [address] [place], [place] [address] [phone]

## 2018-02-26 ENCOUNTER — Telehealth: Payer: Self-pay | Admitting: *Deleted

## 2018-02-26 ENCOUNTER — Ambulatory Visit (HOSPITAL_COMMUNITY)
Admission: RE | Admit: 2018-02-26 | Discharge: 2018-02-26 | Disposition: A | Discharge status: Home / Self Care | Payer: BLUE CROSS/BLUE SHIELD | Source: Ambulatory Visit | Attending: Nurse Practitioner | Admitting: Nurse Practitioner

## 2018-02-26 VITALS — BP 108/66 | HR 96

## 2018-02-26 DIAGNOSIS — I4891 Unspecified atrial fibrillation: Secondary | ICD-10-CM

## 2018-02-26 DIAGNOSIS — Z79899 Other long term (current) drug therapy: Secondary | ICD-10-CM | POA: Diagnosis not present

## 2018-02-26 DIAGNOSIS — R0683 Snoring: Secondary | ICD-10-CM | POA: Diagnosis not present

## 2018-02-26 DIAGNOSIS — I4819 Other persistent atrial fibrillation: Secondary | ICD-10-CM

## 2018-02-26 DIAGNOSIS — R9431 Abnormal electrocardiogram [ECG] [EKG]: Secondary | ICD-10-CM | POA: Diagnosis not present

## 2018-02-26 LAB — BASIC METABOLIC PANEL
ANION GAP: 9 (ref 5–15)
BUN: 21 mg/dL — AB (ref 6–20)
CHLORIDE: 107 mmol/L (ref 98–111)
CO2: 22 mmol/L (ref 22–32)
Calcium: 9.1 mg/dL (ref 8.9–10.3)
Creatinine, Ser: 1.17 mg/dL (ref 0.61–1.24)
GFR calc Af Amer: >60 mL/min (ref >60)
GLUCOSE: 105 mg/dL — AB (ref 70–99)
POTASSIUM: 4.2 mmol/L (ref 3.5–5.1)
Sodium: 138 mmol/L (ref 135–145)

## 2018-02-26 NOTE — Telephone Encounter (Signed)
-----   Message from Juluis Mire, RN sent at 02/26/2018  4:00 PM EDT ----- Regarding: sleep study Pt needs sleep study for afib, witnessed apnea, snoring  Thanks Marzetta Board

## 2018-02-26 NOTE — Progress Notes (Addendum)
Pt in for repeat EKG and BMET.  To be reviewed by Roderic Palau, NP  Pt is in the afib clinic for f/u of ekg showing afib again after afib noted last week. Pt still does not believe he is in persistent afib. He well wear a one week monitor and if in persistent afib will cardiovert. He also is reporting snoring and wife reports that he stops breathing at night. Will order sleep study. He had one years ago but it was  inconclusive.Conitnue Tikosyn/Doac. F/u in 2 weeks.

## 2018-03-03 ENCOUNTER — Telehealth: Payer: Self-pay | Admitting: *Deleted

## 2018-03-03 NOTE — Telephone Encounter (Signed)
Staff message sent to [place] denied in lab sleep study. Approved HST. Approval # [id number] valid 03/03/18 to 05/01/18.

## 2018-03-03 NOTE — Telephone Encounter (Signed)
-----   Message from Juluis Mire, RN sent at 02/26/2018  4:00 PM EDT ----- Regarding: sleep study Pt needs sleep study for afib, witnessed apnea, snoring  Thanks Marzetta Board

## 2018-03-04 NOTE — Addendum Note (Signed)
Addended by: Freada Bergeron on: 03/04/2018 02:49 PM   Modules accepted: Orders

## 2018-03-09 ENCOUNTER — Other Ambulatory Visit: Payer: Self-pay | Admitting: Internal Medicine

## 2018-03-09 ENCOUNTER — Telehealth: Payer: Self-pay | Admitting: *Deleted

## 2018-03-09 NOTE — Telephone Encounter (Signed)
-----   Message from Lauralee Evener, [place] sent at 03/03/2018 10:56 AM EDT ----- Regarding: RE: sleep study BCBS denied. Lab sleep study. Approved HST.  Auth# for HST is [id number]. Valid 03/03/18 to 05/01/18. ----- Message ----- From: Juluis Mire, RN Sent: 02/26/2018   4:00 PM EDT To: Freada Bergeron, CMA, Cv Div Sleep Studies Subject: sleep study                                    Pt needs sleep study for afib, witnessed apnea, snoring  Thanks Marzetta Board

## 2018-03-09 NOTE — Telephone Encounter (Addendum)
RE: sleep study  Carter, Stacy S, RN  Jones, Dorothea G, CMA        yes    Patient is aware and agreeable to Home Sleep Study through [facility]. Patient is scheduled for 10/16 at 11AM to pick up home sleep kit and meet with Respiratory therapist at [facility]. Patient is aware that if this appointment date and time does not work for them they should contact [facility] directly at [phone]. Patient is aware that a sleep packet will be sent from [facility] in week. Left detailed message on voicemail with date and time of titration and informed patient to call back to confirm or reschedule.    

## 2018-03-10 ENCOUNTER — Ambulatory Visit (INDEPENDENT_AMBULATORY_CARE_PROVIDER_SITE_OTHER): Payer: BLUE CROSS/BLUE SHIELD | Admitting: *Deleted

## 2018-03-10 DIAGNOSIS — I4819 Other persistent atrial fibrillation: Secondary | ICD-10-CM | POA: Diagnosis not present

## 2018-03-10 DIAGNOSIS — Z5181 Encounter for therapeutic drug level monitoring: Secondary | ICD-10-CM

## 2018-03-10 LAB — POCT INR: INR: 2.9 (ref 2.0–3.0)

## 2018-03-10 NOTE — Patient Instructions (Signed)
Description   Continue taking 7.5mg  daily except 5mg  on Mondays and Fridays.  Recheck INR in 4 weeks.  Call us with any medication changes # [phone]

## 2018-03-12 DIAGNOSIS — I4819 Other persistent atrial fibrillation: Secondary | ICD-10-CM | POA: Diagnosis not present

## 2018-03-18 ENCOUNTER — Ambulatory Visit (HOSPITAL_BASED_OUTPATIENT_CLINIC_OR_DEPARTMENT_OTHER): Payer: BLUE CROSS/BLUE SHIELD | Attending: Nurse Practitioner | Admitting: Cardiology

## 2018-03-18 DIAGNOSIS — G4733 Obstructive sleep apnea (adult) (pediatric): Secondary | ICD-10-CM | POA: Insufficient documentation

## 2018-03-18 DIAGNOSIS — R0683 Snoring: Secondary | ICD-10-CM | POA: Insufficient documentation

## 2018-03-18 DIAGNOSIS — I4891 Unspecified atrial fibrillation: Secondary | ICD-10-CM | POA: Diagnosis not present

## 2018-03-23 NOTE — Procedures (Signed)
   Patient Name: [name], [name] Date: 03/18/2018 Gender: Male D.O.B: 20-Nov-1960 Age (years): 57 Referring Provider: Sherran Needs Height (inches): 31 Interpreting Physician: Fransico Him MD, ABSM Weight (lbs): 199 RPSGT: Gerhard Perches BMI: 27 MRN: [id number] Neck Size: 17.00  CLINICAL INFORMATION Sleep Study Type: HST  Indication for sleep study: Snoring  Epworth Sleepiness Score: 15  SLEEP STUDY TECHNIQUE A multi-channel overnight portable sleep study was performed. The channels recorded were: nasal airflow, thoracic respiratory movement, and oxygen saturation with a pulse oximetry. Snoring was also monitored.  MEDICATIONS Patient self administered medications include: N/A.  SLEEP ARCHITECTURE Patient was studied for 429.9 minutes. The sleep efficiency was 100.0 % and the patient was supine for 96.9%. The arousal index was 0.0 per hour.  RESPIRATORY PARAMETERS The overall AHI was 26.2 per hour, with a central apnea index of 0.1 per hour.  The oxygen nadir was 83% during sleep.  CARDIAC DATA Mean heart rate during sleep was 70.3 bpm.  IMPRESSIONS - Moderate obstructive sleep apnea occurred during this study (AHI = 26.2/h). - No significant central sleep apnea occurred during this study (CAI = 0.1/h). - Moderate oxygen desaturation was noted during this study (Min O2 = 83%). - Patient snored 5.8% during the sleep.  DIAGNOSIS - Obstructive Sleep Apnea (327.23 [G47.33 ICD-10])  RECOMMENDATIONS - Recommend in lab CPAP titration due to severity of sleep disordered breathing.  - Positional therapy avoiding supine position during sleep. - Avoid alcohol, sedatives and other CNS depressants that may worsen sleep apnea and disrupt normal sleep architecture. - Sleep hygiene should be reviewed to assess factors that may improve sleep quality. - Weight management and regular exercise should be initiated or continued.  [Electronically signed] 03/23/2018 09:11  PM  Fransico Him MD, ABSM Diplomate, American Board of Sleep Medicine

## 2018-03-24 ENCOUNTER — Encounter (HOSPITAL_COMMUNITY): Payer: Self-pay | Admitting: *Deleted

## 2018-03-24 ENCOUNTER — Telehealth: Payer: Self-pay | Admitting: *Deleted

## 2018-03-24 DIAGNOSIS — G4733 Obstructive sleep apnea (adult) (pediatric): Secondary | ICD-10-CM

## 2018-03-24 NOTE — Telephone Encounter (Addendum)
Informed patient of sleep study results and patient understanding was verbalized. Patient understands his sleep study showed they have sleep apnea and recommend CPAP titration. Left detailed message on voicemail and informed patient to call back.

## 2018-03-24 NOTE — Telephone Encounter (Signed)
-----   Message from Sueanne Margarita, MD sent at 03/23/2018  9:14 PM EDT ----- Please let patient know that they have sleep apnea and recommend CPAP titration. Please set up titration in the sleep lab.

## 2018-03-25 ENCOUNTER — Telehealth: Payer: Self-pay | Admitting: *Deleted

## 2018-03-25 NOTE — Telephone Encounter (Signed)
-----   Message from Freada Bergeron, [place] sent at 03/25/2018 12:31 PM EDT ----- Regarding: pre cert Patient states he has 2 insurance coverage and ask that we use them both to get his test passed please.  cpap titration

## 2018-03-25 NOTE — Telephone Encounter (Signed)
Staff message sent to [place]. BCBS denied titration study. Approved APAP through [facility]. Patient's BCBS-[employer] is his secondary insurance and does not require a precert per [employer] @ [employer] however per Anderson Malta in the billing dept we have to go with his primary insurance.

## 2018-03-25 NOTE — Telephone Encounter (Signed)
Informed patient of sleep study results and patient understanding was verbalized. Patient understands his sleep study showed that they have sleep apnea and recommend CPAP titration. Pt is aware and agreeable to these results.

## 2018-03-26 ENCOUNTER — Ambulatory Visit (HOSPITAL_COMMUNITY)
Admission: RE | Admit: 2018-03-26 | Discharge: 2018-03-26 | Disposition: A | Discharge status: Home / Self Care | Payer: BLUE CROSS/BLUE SHIELD | Source: Ambulatory Visit | Attending: Nurse Practitioner | Admitting: Nurse Practitioner

## 2018-03-26 DIAGNOSIS — G4730 Sleep apnea, unspecified: Secondary | ICD-10-CM | POA: Insufficient documentation

## 2018-03-26 DIAGNOSIS — Z79899 Other long term (current) drug therapy: Secondary | ICD-10-CM | POA: Insufficient documentation

## 2018-03-26 DIAGNOSIS — I4891 Unspecified atrial fibrillation: Secondary | ICD-10-CM | POA: Diagnosis not present

## 2018-03-26 MED ORDER — DILTIAZEM HCL 30 MG PO TABS: ORAL_TABLET | ORAL | 2 refills | Status: DC

## 2018-03-26 NOTE — Telephone Encounter (Signed)
BCBS denied titration study. Approved APAP through [facility]. Please write settings for APAP.

## 2018-03-26 NOTE — Telephone Encounter (Signed)
They approved in lab sleep study - please find out why they are denying in lab CPAP titration given his significant OSA

## 2018-03-26 NOTE — Progress Notes (Addendum)
Pt in for EKG.  To be reviewed by Roderic Palau, NP  Pt in for repeat EKG to check qt interval as he had taken some double am dose of Tikosyn by mistake. He had been in rhythm but had a troubling situation yesterday and has been out of rhythm since then. Recent monitor did show for most part he is staying in [place]. He has been found to have severe sleep apnea and is pending cpap titration. EKG shows afib at 99 bpm, qrs int 86 ms, qtc 479 ms( stable).F/u with Dr. Rayann Heman in 3 months

## 2018-03-28 ENCOUNTER — Encounter (HOSPITAL_COMMUNITY): Payer: Self-pay | Admitting: Family Medicine

## 2018-03-28 ENCOUNTER — Ambulatory Visit (HOSPITAL_COMMUNITY)
Admission: EM | Admit: 2018-03-28 | Discharge: 2018-03-28 | Disposition: A | Discharge status: Home / Self Care | Payer: BLUE CROSS/BLUE SHIELD | Attending: Family Medicine | Admitting: Family Medicine

## 2018-03-28 DIAGNOSIS — S46812A Strain of other muscles, fascia and tendons at shoulder and upper arm level, left arm, initial encounter: Secondary | ICD-10-CM | POA: Diagnosis not present

## 2018-03-28 DIAGNOSIS — S86312A Strain of muscle(s) and tendon(s) of peroneal muscle group at lower leg level, left leg, initial encounter: Secondary | ICD-10-CM

## 2018-03-28 MED ORDER — MELOXICAM 7.5 MG PO TABS: 7.5000 mg | ORAL_TABLET | Freq: Every day | ORAL | 1 refills | Status: DC

## 2018-03-28 MED ORDER — CYCLOBENZAPRINE HCL 5 MG PO TABS: 5.0000 mg | ORAL_TABLET | Freq: Every day | ORAL | 0 refills | Status: DC
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
## 2018-03-31 NOTE — Telephone Encounter (Signed)
Patient had HST read on 03/23/18. Not a in lab study.

## 2018-03-31 NOTE — Telephone Encounter (Signed)
Return call: Pt is aware and agreeable to treatment.

## 2018-04-01 NOTE — Telephone Encounter (Signed)
  Lauralee Evener, CMA  Bexton Haak, [place] denied titration. Approved APAP. ([employer] federal is secondary) we have to go with primary decision.

## 2018-04-01 NOTE — Telephone Encounter (Signed)
  Sueanne Margarita, MD  Freada Bergeron, CMA        Order Airsens CPAP with heated humidity and mask of choice with 2 week autotitration from 4 to 18cm H2O. He will need followup with me in 10 weeks   Traci Turner     ----- Message -----  From: Freada Bergeron, CMA  Sent: 03/31/2018  8:58 AM EDT  To: Sueanne Margarita, MD  Subject: APAP orders                    Please write APAP orders. Thanks

## 2018-04-01 NOTE — Telephone Encounter (Signed)
Order faxed to [facility]. 

## 2018-04-06 ENCOUNTER — Encounter (HOSPITAL_BASED_OUTPATIENT_CLINIC_OR_DEPARTMENT_OTHER): Payer: Self-pay

## 2018-04-07 ENCOUNTER — Ambulatory Visit (INDEPENDENT_AMBULATORY_CARE_PROVIDER_SITE_OTHER): Payer: BLUE CROSS/BLUE SHIELD | Admitting: *Deleted

## 2018-04-07 DIAGNOSIS — Z5181 Encounter for therapeutic drug level monitoring: Secondary | ICD-10-CM

## 2018-04-07 DIAGNOSIS — I4819 Other persistent atrial fibrillation: Secondary | ICD-10-CM | POA: Diagnosis not present

## 2018-04-07 DIAGNOSIS — I4891 Unspecified atrial fibrillation: Secondary | ICD-10-CM

## 2018-04-07 LAB — POCT INR: INR: 2.3 (ref 2.0–3.0)

## 2018-04-07 NOTE — Patient Instructions (Signed)
Description   Continue taking 7.5mg  daily except 5mg  on Mondays and Fridays.  Recheck INR in 5 weeks.  Call us with any medication changes # [phone]

## 2018-04-08 ENCOUNTER — Encounter (INDEPENDENT_AMBULATORY_CARE_PROVIDER_SITE_OTHER): Payer: Self-pay | Admitting: Orthopedic Surgery

## 2018-04-08 ENCOUNTER — Ambulatory Visit (INDEPENDENT_AMBULATORY_CARE_PROVIDER_SITE_OTHER): Payer: Self-pay

## 2018-04-08 ENCOUNTER — Ambulatory Visit (INDEPENDENT_AMBULATORY_CARE_PROVIDER_SITE_OTHER): Payer: BLUE CROSS/BLUE SHIELD | Admitting: Orthopedic Surgery

## 2018-04-08 DIAGNOSIS — M542 Cervicalgia: Secondary | ICD-10-CM | POA: Diagnosis not present

## 2018-04-08 DIAGNOSIS — M25512 Pain in left shoulder: Secondary | ICD-10-CM
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
## 2018-04-28 DIAGNOSIS — G4733 Obstructive sleep apnea (adult) (pediatric): Secondary | ICD-10-CM | POA: Diagnosis not present

## 2018-05-15 ENCOUNTER — Ambulatory Visit (INDEPENDENT_AMBULATORY_CARE_PROVIDER_SITE_OTHER): Payer: BLUE CROSS/BLUE SHIELD | Admitting: Pharmacist

## 2018-05-15 DIAGNOSIS — I4819 Other persistent atrial fibrillation: Secondary | ICD-10-CM

## 2018-05-15 DIAGNOSIS — Z5181 Encounter for therapeutic drug level monitoring: Secondary | ICD-10-CM | POA: Diagnosis not present

## 2018-05-15 DIAGNOSIS — I4891 Unspecified atrial fibrillation: Secondary | ICD-10-CM | POA: Diagnosis not present

## 2018-05-15 LAB — POCT INR: INR: 2.9 (ref 2.0–3.0)

## 2018-05-15 NOTE — Patient Instructions (Signed)
Continue taking 7.5mg  daily except 5mg  on Mondays and Fridays.  Recheck INR in 5 weeks.  Call us with any medication changes # [phone]

## 2018-05-18 NOTE — Telephone Encounter (Signed)
Sueanne Margarita, MD  Freada Bergeron, CMA        Order Airsens CPAP with heated humidity and mask of choice with 2 week autotitration from 4 to 18cm H2O. He will need followup with me in 10 weeks   Traci Turner     ----- Message -----  From: Freada Bergeron, CMA  Sent: 03/31/2018  8:58 AM EDT  To: Sueanne Margarita, MD  Subject: APAP orders                    Please write APAP orders. Thanks

## 2018-05-18 NOTE — Telephone Encounter (Signed)
Patient called to say his machine is not blowing enough air out when he first turns it on almost like he is suffocating, but then it blows too hard once it peaks. Patients machine is set on 4 cm to 18 cm.

## 2018-05-28 DIAGNOSIS — G4733 Obstructive sleep apnea (adult) (pediatric): Secondary | ICD-10-CM | POA: Diagnosis not present

## 2018-05-29 ENCOUNTER — Telehealth (INDEPENDENT_AMBULATORY_CARE_PROVIDER_SITE_OTHER): Payer: Self-pay | Admitting: Orthopedic Surgery
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
## 2018-06-14 ENCOUNTER — Other Ambulatory Visit: Payer: Self-pay | Admitting: Internal Medicine

## 2018-06-15 ENCOUNTER — Ambulatory Visit (INDEPENDENT_AMBULATORY_CARE_PROVIDER_SITE_OTHER): Payer: BLUE CROSS/BLUE SHIELD | Admitting: Orthopedic Surgery

## 2018-06-16 ENCOUNTER — Other Ambulatory Visit: Payer: Self-pay | Admitting: Internal Medicine

## 2018-06-18 ENCOUNTER — Other Ambulatory Visit: Payer: Self-pay | Admitting: Internal Medicine

## 2018-06-18 ENCOUNTER — Encounter: Payer: Self-pay | Admitting: Internal Medicine

## 2018-06-19 ENCOUNTER — Other Ambulatory Visit (HOSPITAL_COMMUNITY): Payer: Self-pay | Admitting: *Deleted

## 2018-06-19 ENCOUNTER — Ambulatory Visit (INDEPENDENT_AMBULATORY_CARE_PROVIDER_SITE_OTHER): Payer: BLUE CROSS/BLUE SHIELD | Admitting: Pharmacist

## 2018-06-19 DIAGNOSIS — I4891 Unspecified atrial fibrillation: Secondary | ICD-10-CM

## 2018-06-19 DIAGNOSIS — Z5181 Encounter for therapeutic drug level monitoring: Secondary | ICD-10-CM

## 2018-06-19 DIAGNOSIS — I4819 Other persistent atrial fibrillation: Secondary | ICD-10-CM

## 2018-06-19 LAB — POCT INR: INR: 2.7 (ref 2.0–3.0)

## 2018-06-19 MED ORDER — DOFETILIDE 250 MCG PO CAPS: 250.0000 ug | ORAL_CAPSULE | Freq: Two times a day (BID) | ORAL | 6 refills | Status: DC

## 2018-06-19 MED ORDER — WARFARIN SODIUM 5 MG PO TABS: ORAL_TABLET | ORAL | 0 refills | Status: DC

## 2018-06-19 MED ORDER — IRBESARTAN 300 MG PO TABS: 300.0000 mg | ORAL_TABLET | Freq: Every day | ORAL | 2 refills | Status: DC

## 2018-06-19 MED ORDER — DILTIAZEM HCL ER COATED BEADS 120 MG PO CP24: ORAL_CAPSULE | ORAL | 2 refills | Status: DC

## 2018-06-19 MED ORDER — AMLODIPINE BESYLATE 2.5 MG PO TABS: 2.5000 mg | ORAL_TABLET | Freq: Every day | ORAL | 3 refills | Status: DC

## 2018-06-19 NOTE — Patient Instructions (Signed)
Description   Continue taking 7.5mg  daily except 5mg  on Mondays and Fridays.  Recheck INR in 6 weeks.  Call us with any medication changes # [phone]

## 2018-06-22 ENCOUNTER — Other Ambulatory Visit (HOSPITAL_COMMUNITY): Payer: Self-pay | Admitting: *Deleted

## 2018-06-22 MED ORDER — DOFETILIDE 250 MCG PO CAPS: 250.0000 ug | ORAL_CAPSULE | Freq: Two times a day (BID) | ORAL | 6 refills | Status: DC

## 2018-06-24 ENCOUNTER — Ambulatory Visit (INDEPENDENT_AMBULATORY_CARE_PROVIDER_SITE_OTHER): Payer: BLUE CROSS/BLUE SHIELD | Admitting: Orthopedic Surgery

## 2018-06-24 ENCOUNTER — Encounter (INDEPENDENT_AMBULATORY_CARE_PROVIDER_SITE_OTHER): Payer: Self-pay | Admitting: Orthopedic Surgery

## 2018-06-24 VITALS — Ht 72.0 in | Wt 201.0 lb

## 2018-06-24 DIAGNOSIS — M25512 Pain in left shoulder: Secondary | ICD-10-CM | POA: Diagnosis not present

## 2018-06-28 DIAGNOSIS — G4733 Obstructive sleep apnea (adult) (pediatric): Secondary | ICD-10-CM | POA: Diagnosis not present

## 2018-06-29 ENCOUNTER — Encounter (INDEPENDENT_AMBULATORY_CARE_PROVIDER_SITE_OTHER): Payer: Self-pay | Admitting: Orthopedic Surgery
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
## 2018-07-01 ENCOUNTER — Ambulatory Visit (INDEPENDENT_AMBULATORY_CARE_PROVIDER_SITE_OTHER): Payer: BLUE CROSS/BLUE SHIELD | Admitting: Internal Medicine

## 2018-07-01 ENCOUNTER — Encounter: Payer: Self-pay | Admitting: Internal Medicine

## 2018-07-01 VITALS — BP 110/70 | HR 89 | Ht 72.0 in | Wt 210.6 lb

## 2018-07-01 DIAGNOSIS — I10 Essential (primary) hypertension: Secondary | ICD-10-CM | POA: Diagnosis not present

## 2018-07-01 DIAGNOSIS — I4819 Other persistent atrial fibrillation: Secondary | ICD-10-CM

## 2018-07-01 LAB — BASIC METABOLIC PANEL
BUN/Creatinine Ratio: 13 (ref 9–20)
BUN: 17 mg/dL (ref 6–24)
CALCIUM: 9.8 mg/dL (ref 8.7–10.2)
CO2: 22 mmol/L (ref 20–29)
CREATININE: 1.27 mg/dL (ref 0.76–1.27)
Chloride: 102 mmol/L (ref 96–106)
GFR calc Af Amer: 72 mL/min/{1.73_m2} (ref >59)
GFR calc non Af Amer: 62 mL/min/{1.73_m2} (ref >59)
GLUCOSE: 118 mg/dL — AB (ref 65–99)
Potassium: 4.2 mmol/L (ref 3.5–5.2)
Sodium: 140 mmol/L (ref 134–144)

## 2018-07-01 LAB — MAGNESIUM: MAGNESIUM: 2 mg/dL (ref 1.6–2.3)

## 2018-07-01 NOTE — Patient Instructions (Addendum)
Medication Instructions:  Your physician recommends that you continue on your current medications as directed. Please refer to the Current Medication list given to you today.  Labwork: You will get lab work today:  BMET and magnesium  Testing/Procedures: None ordered.  Follow-Up: Your physician wants you to follow-up in: 3 months with Ricky at the afib clinic.  Any Other Special Instructions Will Be Listed Below (If Applicable).  If you need a refill on your cardiac medications before your next appointment, please call your pharmacy.

## 2018-07-01 NOTE — Progress Notes (Signed)
PCP: Biagio Borg, MD Primary Cardiologist: Radford Pax  Primary EP: Dr Hart Carwin is a 58 y.o. male who presents today for routine electrophysiology followup.  Since last being seen in our clinic, the patient reports doing very well. He has noted some irregular heart beats over the last few days but nothing sustained. He has been asymptomatic. Today, he denies symptoms of palpitations, chest pain, shortness of breath,  lower extremity edema, dizziness, presyncope, or syncope.  The patient is otherwise without complaint today.   Past Medical History:  Diagnosis Date  . Arthritis    "maybe in my hands" (10/31/2016)  . Atrial flutter ([place])   . CHEST PAIN-PRECORDIAL 05/25/2009  . ERECTILE DYSFUNCTION, ORGANIC 01/16/2010  . GERD (gastroesophageal reflux disease)   . HYPERLIPIDEMIA 12/15/2007  . HYPERTENSION 12/15/2007  . HYPOKALEMIA 12/15/2007  . Internal hemorrhoids   . Persistent atrial fibrillation    a. prior ablation/flecainide; placed on Tikosyn 07/2017.  Marland Kitchen POSTURAL LIGHTHEADEDNESS 07/06/2008  . Sinus bradycardia   . Visit for monitoring Tikosyn therapy 07/03/2017   Past Surgical History:  Procedure Laterality Date  . ATRIAL FIBRILLATION ABLATION N/A 08/13/2011   Procedure: ATRIAL FIBRILLATION ABLATION;  Surgeon: Thompson Grayer, MD;  Location: [facility] CATH LAB;  Service: Cardiovascular;  Laterality: N/A;  . ATRIAL FIBRILLATION ABLATION  10/31/2016  . ATRIAL FIBRILLATION ABLATION N/A 10/31/2016   Procedure: Atrial Fibrillation Ablation;  Surgeon: Thompson Grayer, MD;  Location: [place] CV LAB;  Service: Cardiovascular;  Laterality: N/A;  . ATRIAL FLUTTER ABLATION N/A 12/27/2011   CTI repeat ablation by Dr Rayann Heman  . CARDIOVERSION  11/21/2011   Procedure: CARDIOVERSION;  Surgeon: Larey Dresser, MD;  Location: [place];  Service: Cardiovascular;  Laterality: N/A;  . CARDIOVERSION N/A 12/02/2016   Procedure: CARDIOVERSION;  Surgeon: [name], MD;  Location: [place];  Service:  Cardiovascular;  Laterality: N/A;  . CHALAZION EXCISION Right   . ELBOW SURGERY Right early 1980s   bone spur/chip; "opened me up"  . INGUINAL HERNIA REPAIR Right 11/2006  . TEE WITHOUT CARDIOVERSION  08/12/2011   Procedure: TRANSESOPHAGEAL ECHOCARDIOGRAM (TEE);  Surgeon: Fay Records, MD;  Location: [facility] ENDOSCOPY;  Service: Cardiovascular;  Laterality: N/A;  . TEE WITHOUT CARDIOVERSION N/A 10/30/2016   Procedure: TRANSESOPHAGEAL ECHOCARDIOGRAM (TEE);  Surgeon: Josue Hector, MD;  Location: [facility] ENDOSCOPY;  Service: Cardiovascular;  Laterality: N/A;  . TEE WITHOUT CARDIOVERSION N/A 12/02/2016   Procedure: TRANSESOPHAGEAL ECHOCARDIOGRAM (TEE);  Surgeon: [name], MD;  Location: [facility] ENDOSCOPY;  Service: Cardiovascular;  Laterality: N/A;    ROS- all systems are reviewed and negatives except as per HPI above  Current Outpatient Medications  Medication Sig Dispense Refill  . amLODipine (NORVASC) 2.5 MG tablet Take 1 tablet (2.5 mg total) by mouth daily. 90 tablet 3  . atorvastatin (LIPITOR) 20 MG tablet Take 1 tablet (20 mg total) by mouth daily. 90 tablet 3  . diltiazem (CARDIZEM CD) 120 MG 24 hr capsule TAKE 1 CAPSULE BY MOUTH EVERY DAY 90 capsule 2  . diltiazem (CARDIZEM) 30 MG tablet Take 1 tablet every 4 hours AS NEEDED for heart rate over 100 45 tablet 2  . dofetilide (TIKOSYN) 250 MCG capsule Take 1 capsule (250 mcg total) by mouth 2 (two) times daily. 60 capsule 6  . doxazosin (CARDURA) 4 MG tablet Take 1 tablet (4 mg total) by mouth at bedtime. 90 tablet 3  . irbesartan (AVAPRO) 300 MG tablet Take 1 tablet (300 mg total) by mouth  daily. 90 tablet 2  . warfarin (COUMADIN) 5 MG tablet Take as directed by coumadin clinic 150 tablet 0   No current facility-administered medications for this visit.     Physical Exam: Vitals:   07/01/18 1037  BP: 110/70  Pulse: 89  SpO2: 97%  Weight: 210 lb 9.6 oz (95.5 kg)  Height: 6' (1.829 m)    GEN- The patient is well appearing, alert and  oriented x 3 today.   Head- normocephalic, atraumatic Eyes-  Sclera clear, conjunctiva pink Ears- hearing intact Oropharynx- clear Lungs- Clear to ausculation bilaterally, normal work of breathing Heart- irregular rate and rhythm, no murmurs, rubs or gallops, PMI not laterally displaced GI- soft, NT, ND, + BS Extremities- no clubbing, cyanosis, or edema  Wt Readings from Last 3 Encounters:  07/01/18 210 lb 9.6 oz (95.5 kg)  06/24/18 201 lb (91.2 kg)  11/27/17 201 lb (91.2 kg)    EKG tracing ordered today is personally reviewed and shows atrial fibrillation HR 89, QRS 86, QTc 489  Assessment and Plan:  1. Persistent atrial fibrillation Has brief episodes with Tikosyn but overall improved. Monitor 03/2018 showed 25% afib. Patient is asymptomatic. Continue Tikosyn 250 mcg BID Continue diltiazem 120 mg daily, 30mg  PRN q4-6hrs. Continue anticoagulation with warfarin. Check Bmet/mag  2. HTN Stable, no changes today   Follow up in Afib clinic in 3 months.   Thompson Grayer MD 07/01/2018 11:21 AM

## 2018-07-02 ENCOUNTER — Telehealth: Payer: Self-pay | Admitting: Internal Medicine

## 2018-07-02 NOTE — Telephone Encounter (Signed)
The patient has been notified of the result and verbalized understanding.  All questions (if any) were answered. Julaine Hua, [place] 07/02/2018 11:02 AM

## 2018-07-02 NOTE — Telephone Encounter (Signed)
New Message   Patient returning call about labs

## 2018-07-10 NOTE — Telephone Encounter (Signed)
Patient still feels like 4 cm is not enough air and he would like to increase his pressure above 4 cm and 17 cm to 18 cm is too much and would like the pressure below 17 because it feels like it is pushing air down his throat.

## 2018-07-13 NOTE — Telephone Encounter (Signed)
Change CPAP to 6 to 15cm H2O and get a download in 2 weeks

## 2018-07-14 NOTE — Addendum Note (Signed)
Addended by: Freada Bergeron on: 07/14/2018 04:54 PM   Modules accepted: Orders

## 2018-07-14 NOTE — Telephone Encounter (Signed)
Order placed to [facility] today. 

## 2018-07-15 ENCOUNTER — Other Ambulatory Visit: Payer: Self-pay | Admitting: Internal Medicine

## 2018-07-29 DIAGNOSIS — G4733 Obstructive sleep apnea (adult) (pediatric): Secondary | ICD-10-CM | POA: Diagnosis not present

## 2018-07-31 ENCOUNTER — Ambulatory Visit (INDEPENDENT_AMBULATORY_CARE_PROVIDER_SITE_OTHER): Payer: BLUE CROSS/BLUE SHIELD

## 2018-07-31 DIAGNOSIS — I4819 Other persistent atrial fibrillation: Secondary | ICD-10-CM

## 2018-07-31 DIAGNOSIS — Z5181 Encounter for therapeutic drug level monitoring: Secondary | ICD-10-CM | POA: Diagnosis not present

## 2018-07-31 DIAGNOSIS — I4891 Unspecified atrial fibrillation: Secondary | ICD-10-CM | POA: Diagnosis not present

## 2018-07-31 LAB — POCT INR: INR: 2.7 (ref 2.0–3.0)

## 2018-07-31 NOTE — Patient Instructions (Signed)
Description   Continue on same dosage 7.5mg  daily except 5mg  on Mondays and Fridays.  Recheck INR in 6 weeks.  Call us with any medication changes # [phone]

## 2018-08-03 DIAGNOSIS — G4733 Obstructive sleep apnea (adult) (pediatric): Secondary | ICD-10-CM | POA: Diagnosis not present

## 2018-08-06 ENCOUNTER — Ambulatory Visit (HOSPITAL_COMMUNITY)
Admission: RE | Admit: 2018-08-06 | Discharge: 2018-08-06 | Disposition: A | Discharge status: Home / Self Care | Payer: BLUE CROSS/BLUE SHIELD | Source: Ambulatory Visit | Attending: Nurse Practitioner | Admitting: Nurse Practitioner

## 2018-08-06 ENCOUNTER — Encounter (HOSPITAL_COMMUNITY): Payer: Self-pay | Admitting: Nurse Practitioner

## 2018-08-06 DIAGNOSIS — I480 Paroxysmal atrial fibrillation: Secondary | ICD-10-CM | POA: Insufficient documentation

## 2018-08-06 NOTE — Progress Notes (Signed)
Pt in for an urgent EKG after taking his am and pm Tikosyn dose this am 250 mcg's each. Med usually peaks at 2-3 hours. His ekg shows Sinus brady at 47 bpm, with  a qtc pf 476 ms. I discussed with Claiborne Billings, PharmD and he should be ok going forward, he is not to take his PM dose but resume 250 mcg Tikosyn in the am. He was instructed to be more careful with Tikosyn dosing. He has f/u here in April.

## 2018-08-27 DIAGNOSIS — G4733 Obstructive sleep apnea (adult) (pediatric): Secondary | ICD-10-CM | POA: Diagnosis not present

## 2018-09-10 ENCOUNTER — Encounter: Payer: Self-pay | Admitting: Internal Medicine

## 2018-09-10 ENCOUNTER — Telehealth: Payer: Self-pay

## 2018-09-10 ENCOUNTER — Ambulatory Visit (INDEPENDENT_AMBULATORY_CARE_PROVIDER_SITE_OTHER): Payer: BLUE CROSS/BLUE SHIELD | Admitting: Internal Medicine

## 2018-09-10 DIAGNOSIS — R739 Hyperglycemia, unspecified: Secondary | ICD-10-CM

## 2018-09-10 DIAGNOSIS — Z0000 Encounter for general adult medical examination without abnormal findings: Secondary | ICD-10-CM | POA: Diagnosis not present

## 2018-09-10 MED ORDER — AMLODIPINE BESYLATE 2.5 MG PO TABS: 2.5000 mg | ORAL_TABLET | Freq: Every day | ORAL | 3 refills | Status: DC

## 2018-09-10 MED ORDER — DILTIAZEM HCL ER COATED BEADS 120 MG PO CP24: 120.0000 mg | ORAL_CAPSULE | Freq: Every day | ORAL | 3 refills | Status: DC

## 2018-09-10 MED ORDER — DOFETILIDE 250 MCG PO CAPS: 250.0000 ug | ORAL_CAPSULE | Freq: Two times a day (BID) | ORAL | 6 refills | Status: DC

## 2018-09-10 MED ORDER — DOXAZOSIN MESYLATE 4 MG PO TABS: 4.0000 mg | ORAL_TABLET | Freq: Every day | ORAL | 3 refills | Status: DC

## 2018-09-10 MED ORDER — IRBESARTAN 300 MG PO TABS: 300.0000 mg | ORAL_TABLET | Freq: Every day | ORAL | 3 refills | Status: DC

## 2018-09-10 MED ORDER — ATORVASTATIN CALCIUM 20 MG PO TABS: 20.0000 mg | ORAL_TABLET | Freq: Every day | ORAL | 3 refills | Status: DC

## 2018-09-10 NOTE — Patient Instructions (Signed)

## 2018-09-10 NOTE — Assessment & Plan Note (Signed)

## 2018-09-10 NOTE — Assessment & Plan Note (Signed)
stable overall by history and exam, recent data reviewed with pt, and pt to continue medical treatment as before,  to f/u any worsening symptoms or concerns  

## 2018-09-10 NOTE — Progress Notes (Signed)
Patient ID: [name] [name], male   DOB: 28-Dec-1960, 58 y.o.   MRN: [id number]  Virtual Visit via Video Note  I connected with [name] [name] on 09/10/18 at  8:20 AM EDT by a video enabled telemedicine application and verified that I am speaking with the correct person using two identifiers.  I am at the office, pt is at home, and no other persons present   I discussed the limitations of evaluation and management by telemedicine and the availability of in person appointments. The patient expressed understanding and agreed to proceed.  History of Present Illness: Here for wellness and f/u;  Overall doing ok;  Pt denies Chest pain, worsening SOB, DOE, wheezing, orthopnea, PND, worsening LE edema, palpitations, dizziness or syncope.  Pt denies neurological change such as new headache, facial or extremity weakness.  Pt denies polydipsia, polyuria, or low sugar symptoms. Pt states overall good compliance with treatment and medications, good tolerability, and has been trying to follow appropriate diet.  Pt denies worsening depressive symptoms, suicidal ideation or panic. No fever, night sweats, wt loss, loss of appetite, or other constitutional symptoms.  Pt states good ability with ADL's, has low fall risk, home safety reviewed and adequate, no other significant changes in hearing or vision, and only occasionally active with exercise.  Has f/u INR tomorrow planned, no new complaints.   Past Medical History:  Diagnosis Date  . Arthritis    "maybe in my hands" (10/31/2016)  . Atrial flutter ([place])   . CHEST PAIN-PRECORDIAL 05/25/2009  . ERECTILE DYSFUNCTION, ORGANIC 01/16/2010  . GERD (gastroesophageal reflux disease)   . HYPERLIPIDEMIA 12/15/2007  . HYPERTENSION 12/15/2007  . HYPOKALEMIA 12/15/2007  . Internal hemorrhoids   . Persistent atrial fibrillation    a. prior ablation/flecainide; placed on Tikosyn 07/2017.  Marland Kitchen POSTURAL LIGHTHEADEDNESS 07/06/2008  . Sinus bradycardia   . Visit for monitoring  Tikosyn therapy 07/03/2017   Past Surgical History:  Procedure Laterality Date  . ATRIAL FIBRILLATION ABLATION N/A 08/13/2011   Procedure: ATRIAL FIBRILLATION ABLATION;  Surgeon: Thompson Grayer, MD;  Location: [facility] CATH LAB;  Service: Cardiovascular;  Laterality: N/A;  . ATRIAL FIBRILLATION ABLATION  10/31/2016  . ATRIAL FIBRILLATION ABLATION N/A 10/31/2016   Procedure: Atrial Fibrillation Ablation;  Surgeon: Thompson Grayer, MD;  Location: [place] CV LAB;  Service: Cardiovascular;  Laterality: N/A;  . ATRIAL FLUTTER ABLATION N/A 12/27/2011   CTI repeat ablation by Dr Rayann Heman  . CARDIOVERSION  11/21/2011   Procedure: CARDIOVERSION;  Surgeon: Larey Dresser, MD;  Location: [place];  Service: Cardiovascular;  Laterality: N/A;  . CARDIOVERSION N/A 12/02/2016   Procedure: CARDIOVERSION;  Surgeon: [name], MD;  Location: [place];  Service: Cardiovascular;  Laterality: N/A;  . CHALAZION EXCISION Right   . ELBOW SURGERY Right early 1980s   bone spur/chip; "opened me up"  . INGUINAL HERNIA REPAIR Right 11/2006  . TEE WITHOUT CARDIOVERSION  08/12/2011   Procedure: TRANSESOPHAGEAL ECHOCARDIOGRAM (TEE);  Surgeon: Fay Records, MD;  Location: [facility] ENDOSCOPY;  Service: Cardiovascular;  Laterality: N/A;  . TEE WITHOUT CARDIOVERSION N/A 10/30/2016   Procedure: TRANSESOPHAGEAL ECHOCARDIOGRAM (TEE);  Surgeon: Josue Hector, MD;  Location: [facility] ENDOSCOPY;  Service: Cardiovascular;  Laterality: N/A;  . TEE WITHOUT CARDIOVERSION N/A 12/02/2016   Procedure: TRANSESOPHAGEAL ECHOCARDIOGRAM (TEE);  Surgeon: [name], MD;  Location: [facility] ENDOSCOPY;  Service: Cardiovascular;  Laterality: N/A;    reports that he quit smoking about 7 years ago. His smoking use included cigarettes. He has a  1.50 pack-year smoking history. He has never used smokeless tobacco. He reports current alcohol use. He reports that he does not use drugs. family history includes Heart disease in his sister; Other in his father; Sleep apnea  in his sister; Stroke in his mother; Thyroid disease in his sister. Allergies  Allergen Reactions  . Ibuprofen Palpitations   Current Outpatient Medications on File Prior to Visit  Medication Sig Dispense Refill  . amLODipine (NORVASC) 2.5 MG tablet Take 1 tablet (2.5 mg total) by mouth daily. 90 tablet 3  . atorvastatin (LIPITOR) 20 MG tablet Take 1 tablet (20 mg total) by mouth daily. 90 tablet 3  . diltiazem (CARDIZEM CD) 120 MG 24 hr capsule TAKE 1 CAPSULE BY MOUTH EVERY DAY 90 capsule 1  . diltiazem (CARDIZEM) 30 MG tablet Take 1 tablet every 4 hours AS NEEDED for heart rate over 100 45 tablet 2  . dofetilide (TIKOSYN) 250 MCG capsule Take 1 capsule (250 mcg total) by mouth 2 (two) times daily. 60 capsule 6  . doxazosin (CARDURA) 4 MG tablet Take 1 tablet (4 mg total) by mouth at bedtime. 90 tablet 3  . irbesartan (AVAPRO) 300 MG tablet Take 1 tablet (300 mg total) by mouth daily. 90 tablet 2  . warfarin (COUMADIN) 5 MG tablet Take as directed by coumadin clinic 150 tablet 0   No current facility-administered medications on file prior to visit.    Observations/Objective: Alert, mentating well, not ill appearing, cn 2-12 intact, moves all 4x, resps normal, no visible rash or swelling Lab Results  Component Value Date   WBC 8.7 09/08/2017   HGB 14.3 09/08/2017   HCT 42.2 09/08/2017   PLT 200.0 09/08/2017   GLUCOSE 118 (H) 07/01/2018   CHOL 171 09/08/2017   TRIG 145.0 09/08/2017   HDL 52.80 09/08/2017   LDLDIRECT 115.7 07/13/2013   LDLCALC 90 09/08/2017   ALT 37 09/08/2017   AST 24 09/08/2017   NA 140 07/01/2018   K 4.2 07/01/2018   CL 102 07/01/2018   CREATININE 1.27 07/01/2018   BUN 17 07/01/2018   CO2 22 07/01/2018   TSH 0.84 09/08/2017   PSA 0.81 09/08/2017   INR 2.7 07/31/2018   HGBA1C 6.0 09/08/2017    Assessment and Plan: See notes  Follow Up Instructions: See notes   I discussed the assessment and treatment plan with the patient. The patient was provided  an opportunity to ask questions and all were answered. The patient agreed with the plan and demonstrated an understanding of the instructions.   The patient was advised to call back or seek an in-person evaluation if the symptoms worsen or if the condition fails to improve as anticipated.  Cathlean Cower, MD

## 2018-09-10 NOTE — Telephone Encounter (Signed)

## 2018-09-11 ENCOUNTER — Other Ambulatory Visit: Payer: Self-pay

## 2018-09-11 ENCOUNTER — Ambulatory Visit (INDEPENDENT_AMBULATORY_CARE_PROVIDER_SITE_OTHER): Payer: BLUE CROSS/BLUE SHIELD | Admitting: Pharmacist

## 2018-09-11 DIAGNOSIS — Z5181 Encounter for therapeutic drug level monitoring: Secondary | ICD-10-CM | POA: Diagnosis not present

## 2018-09-11 DIAGNOSIS — I4891 Unspecified atrial fibrillation: Secondary | ICD-10-CM

## 2018-09-11 DIAGNOSIS — I4819 Other persistent atrial fibrillation: Secondary | ICD-10-CM | POA: Diagnosis not present

## 2018-09-11 LAB — POCT INR: INR: 1.8 — AB (ref 2.0–3.0)

## 2018-09-11 MED ORDER — APIXABAN 5 MG PO TABS: 5.0000 mg | ORAL_TABLET | Freq: Two times a day (BID) | ORAL | 5 refills | Status: DC

## 2018-09-25 ENCOUNTER — Other Ambulatory Visit (INDEPENDENT_AMBULATORY_CARE_PROVIDER_SITE_OTHER): Payer: BLUE CROSS/BLUE SHIELD

## 2018-09-25 DIAGNOSIS — R739 Hyperglycemia, unspecified: Secondary | ICD-10-CM

## 2018-09-25 DIAGNOSIS — Z125 Encounter for screening for malignant neoplasm of prostate: Secondary | ICD-10-CM

## 2018-09-25 DIAGNOSIS — Z0000 Encounter for general adult medical examination without abnormal findings: Secondary | ICD-10-CM

## 2018-09-25 LAB — CBC WITH DIFFERENTIAL/PLATELET
Basophils Absolute: 0 10*3/uL (ref 0.0–0.1)
Basophils Relative: 0.5 % (ref 0.0–3.0)
Eosinophils Absolute: 0.2 10*3/uL (ref 0.0–0.7)
Eosinophils Relative: 2.5 % (ref 0.0–5.0)
HCT: 43.8 % (ref 39.0–52.0)
Hemoglobin: 14.9 g/dL (ref 13.0–17.0)
Lymphocytes Relative: 32.7 % (ref 12.0–46.0)
Lymphs Abs: 2.7 10*3/uL (ref 0.7–4.0)
MCHC: 34 g/dL (ref 30.0–36.0)
MCV: 88.6 fl (ref 78.0–100.0)
Monocytes Absolute: 0.9 10*3/uL (ref 0.1–1.0)
Monocytes Relative: 11.3 % (ref 3.0–12.0)
Neutro Abs: 4.3 10*3/uL (ref 1.4–7.7)
Neutrophils Relative %: 53 % (ref 43.0–77.0)
Platelets: 225 10*3/uL (ref 150.0–400.0)
RBC: 4.95 Mil/uL (ref 4.22–5.81)
RDW: 13.8 % (ref 11.5–15.5)
WBC: 8.2 10*3/uL (ref 4.0–10.5)

## 2018-09-25 LAB — BASIC METABOLIC PANEL
BUN: 21 mg/dL (ref 6–23)
CO2: 25 mEq/L (ref 19–32)
Calcium: 8.9 mg/dL (ref 8.4–10.5)
Chloride: 105 mEq/L (ref 96–112)
Creatinine, Ser: 1.08 mg/dL (ref 0.40–1.50)
GFR: 84.64 mL/min (ref >60.00)
Glucose, Bld: 121 mg/dL — ABNORMAL HIGH (ref 70–99)
Potassium: 3.8 mEq/L (ref 3.5–5.1)
Sodium: 138 mEq/L (ref 135–145)

## 2018-09-25 LAB — HEPATIC FUNCTION PANEL
ALT: 29 U/L (ref 0–53)
AST: 18 U/L (ref 0–37)
Albumin: 4.1 g/dL (ref 3.5–5.2)
Alkaline Phosphatase: 59 U/L (ref 39–117)
Bilirubin, Direct: 0.2 mg/dL (ref 0.0–0.3)
Total Bilirubin: 0.8 mg/dL (ref 0.2–1.2)
Total Protein: 7.2 g/dL (ref 6.0–8.3)

## 2018-09-25 LAB — URINALYSIS, ROUTINE W REFLEX MICROSCOPIC
Bilirubin Urine: NEGATIVE
Hgb urine dipstick: NEGATIVE
Ketones, ur: NEGATIVE
Leukocytes,Ua: NEGATIVE
Nitrite: NEGATIVE
Specific Gravity, Urine: 1.02 (ref 1.000–1.030)
Total Protein, Urine: NEGATIVE
Urine Glucose: NEGATIVE
Urobilinogen, UA: 0.2 (ref 0.0–1.0)
pH: 5 (ref 5.0–8.0)

## 2018-09-25 LAB — LIPID PANEL
Cholesterol: 173 mg/dL (ref 0–200)
HDL: 57 mg/dL (ref >39.00)
LDL Cholesterol: 85 mg/dL (ref 0–99)
NonHDL: 116.36
Total CHOL/HDL Ratio: 3
Triglycerides: 158 mg/dL — ABNORMAL HIGH (ref 0.0–149.0)
VLDL: 31.6 mg/dL (ref 0.0–40.0)

## 2018-09-25 LAB — TSH: TSH: 1.13 u[IU]/mL (ref 0.35–4.50)

## 2018-09-25 LAB — HEMOGLOBIN A1C: Hgb A1c MFr Bld: 6.1 % (ref 4.6–6.5)

## 2018-09-25 LAB — PSA: PSA: 0.88 ng/mL (ref 0.10–4.00)

## 2018-09-27 DIAGNOSIS — G4733 Obstructive sleep apnea (adult) (pediatric): Secondary | ICD-10-CM | POA: Diagnosis not present

## 2018-10-01 ENCOUNTER — Other Ambulatory Visit: Payer: Self-pay

## 2018-10-01 ENCOUNTER — Ambulatory Visit (HOSPITAL_COMMUNITY)
Admission: RE | Admit: 2018-10-01 | Discharge: 2018-10-01 | Disposition: A | Discharge status: Home / Self Care | Payer: BLUE CROSS/BLUE SHIELD | Source: Ambulatory Visit | Attending: Physician Assistant | Admitting: Physician Assistant

## 2018-10-02 ENCOUNTER — Encounter (HOSPITAL_COMMUNITY): Payer: Self-pay | Admitting: Physician Assistant

## 2018-10-02 ENCOUNTER — Other Ambulatory Visit: Payer: Self-pay

## 2018-10-02 ENCOUNTER — Ambulatory Visit (HOSPITAL_COMMUNITY)
Admission: RE | Admit: 2018-10-02 | Discharge: 2018-10-02 | Disposition: A | Discharge status: Home / Self Care | Payer: BLUE CROSS/BLUE SHIELD | Source: Ambulatory Visit | Attending: Physician Assistant | Admitting: Physician Assistant

## 2018-10-02 VITALS — BP 122/79 | HR 48 | Ht 72.0 in | Wt 205.0 lb

## 2018-10-02 DIAGNOSIS — I480 Paroxysmal atrial fibrillation: Secondary | ICD-10-CM | POA: Diagnosis not present

## 2018-10-02 NOTE — Progress Notes (Signed)
Electrophysiology TeleHealth Note   Due to national recommendations of social distancing due to [name], Audio/video telehealth visit is felt to be most appropriate for this patient at this time.  See consent below from today for patient consent regarding telehealth for the Atrial Fibrillation Clinic. Consent obtained verbally.   Date:  10/02/2018   ID:  [name] [name], DOB 28-Jul-1960, MRN [id number]  Location: home  Provider location: [address] [place], [place] [address] Evaluation Performed: Follow up  PCP:  Biagio Borg, MD  Primary Cardiologist:  Dr Radford Pax Primary Electrophysiologist: Dr Rayann Heman   CC: Follow up for atrial fibrillation   History of Present Illness: [name] [name] is a 58 y.o. male who presents via audio/video conferencing for a telehealth visit today. Patient reports that he has done very well since his last visit. He did have one brief episode of heart racing which resolved quickly with PRN diltiazem. He was recently switched from warfarin to Eliquis, no bleeding issues.   Today, he denies symptoms of palpitations, chest pain, shortness of breath, orthopnea, PND, lower extremity edema, claudication, dizziness, presyncope, syncope, bleeding, or neurologic sequela. The patient is tolerating medications without difficulties and is otherwise without complaint today.   he denies symptoms of cough, fevers, chills, or new SOB worrisome for COVID 19.     he has a BMI of Body mass index is 27.8 kg/m.Marland Kitchen Filed Weights   10/02/18 1017  Weight: 93 kg    Past Medical History:  Diagnosis Date  . Arthritis    "maybe in my hands" (10/31/2016)  . Atrial flutter ([place])   . CHEST PAIN-PRECORDIAL 05/25/2009  . ERECTILE DYSFUNCTION, ORGANIC 01/16/2010  . GERD (gastroesophageal reflux disease)   . HYPERLIPIDEMIA 12/15/2007  . HYPERTENSION 12/15/2007  . HYPOKALEMIA 12/15/2007  . Internal hemorrhoids   . Persistent atrial fibrillation    a. prior ablation/flecainide;  placed on Tikosyn 07/2017.  Marland Kitchen POSTURAL LIGHTHEADEDNESS 07/06/2008  . Sinus bradycardia   . Visit for monitoring Tikosyn therapy 07/03/2017   Past Surgical History:  Procedure Laterality Date  . ATRIAL FIBRILLATION ABLATION N/A 08/13/2011   Procedure: ATRIAL FIBRILLATION ABLATION;  Surgeon: Thompson Grayer, MD;  Location: [facility] CATH LAB;  Service: Cardiovascular;  Laterality: N/A;  . ATRIAL FIBRILLATION ABLATION  10/31/2016  . ATRIAL FIBRILLATION ABLATION N/A 10/31/2016   Procedure: Atrial Fibrillation Ablation;  Surgeon: Thompson Grayer, MD;  Location: [place] CV LAB;  Service: Cardiovascular;  Laterality: N/A;  . ATRIAL FLUTTER ABLATION N/A 12/27/2011   CTI repeat ablation by Dr Rayann Heman  . CARDIOVERSION  11/21/2011   Procedure: CARDIOVERSION;  Surgeon: Larey Dresser, MD;  Location: [place];  Service: Cardiovascular;  Laterality: N/A;  . CARDIOVERSION N/A 12/02/2016   Procedure: CARDIOVERSION;  Surgeon: [name], MD;  Location: [place];  Service: Cardiovascular;  Laterality: N/A;  . CHALAZION EXCISION Right   . ELBOW SURGERY Right early 1980s   bone spur/chip; "opened me up"  . INGUINAL HERNIA REPAIR Right 11/2006  . TEE WITHOUT CARDIOVERSION  08/12/2011   Procedure: TRANSESOPHAGEAL ECHOCARDIOGRAM (TEE);  Surgeon: Fay Records, MD;  Location: [facility] ENDOSCOPY;  Service: Cardiovascular;  Laterality: N/A;  . TEE WITHOUT CARDIOVERSION N/A 10/30/2016   Procedure: TRANSESOPHAGEAL ECHOCARDIOGRAM (TEE);  Surgeon: Josue Hector, MD;  Location: [facility] ENDOSCOPY;  Service: Cardiovascular;  Laterality: N/A;  . TEE WITHOUT CARDIOVERSION N/A 12/02/2016   Procedure: TRANSESOPHAGEAL ECHOCARDIOGRAM (TEE);  Surgeon: [name], MD;  Location: [place];  Service: Cardiovascular;  Laterality: N/A;     Current Outpatient Medications  Medication Sig Dispense Refill  . amLODipine (NORVASC) 2.5 MG tablet Take 1 tablet (2.5 mg total) by mouth daily. 90 tablet 3  . apixaban (ELIQUIS) 5 MG TABS tablet Take 1  tablet (5 mg total) by mouth 2 (two) times daily. 60 tablet 5  . Arginine 500 MG CAPS Take by mouth.    Marland Kitchen atorvastatin (LIPITOR) 20 MG tablet Take 1 tablet (20 mg total) by mouth daily. 90 tablet 3  . cholecalciferol (VITAMIN D-400) 10 MCG (400 UNIT) TABS tablet Take 400 Units by mouth.    . diltiazem (CARDIZEM CD) 120 MG 24 hr capsule Take 1 capsule (120 mg total) by mouth daily. TAKE 1 CAPSULE BY MOUTH EVERY DAY 90 capsule 3  . diltiazem (CARDIZEM) 30 MG tablet Take 1 tablet every 4 hours AS NEEDED for heart rate over 100 45 tablet 2  . dofetilide (TIKOSYN) 250 MCG capsule Take 1 capsule (250 mcg total) by mouth 2 (two) times daily. 60 capsule 6  . doxazosin (CARDURA) 4 MG tablet Take 1 tablet (4 mg total) by mouth at bedtime. 90 tablet 3  . irbesartan (AVAPRO) 300 MG tablet Take 1 tablet (300 mg total) by mouth daily. 90 tablet 3  . Lysine HCl 500 MG TABS Take by mouth.    . Multiple Vitamins-Minerals (MULTIVITAL) tablet Take 1 tablet by mouth daily.    . vitamin C (ASCORBIC ACID) 250 MG tablet Take 250 mg by mouth daily.     No current facility-administered medications for this encounter.     Allergies:   Ibuprofen   Social History:  The patient  reports that he quit smoking about 7 years ago. His smoking use included cigarettes. He has a 1.50 pack-year smoking history. He has never used smokeless tobacco. He reports current alcohol use. He reports that he does not use drugs.   Family History:  The patient's  family history includes Heart disease in his sister; Other in his father; Sleep apnea in his sister; Stroke in his mother; Thyroid disease in his sister.    ROS:  Please see the history of present illness.   All other systems are personally reviewed and negative.   Exam: Well appearing, alert and conversant, regular work of breathing,  good skin color  Recent Labs: 07/01/2018: Magnesium 2.0 09/25/2018: ALT 29; BUN 21; Creatinine, Ser 1.08; Hemoglobin 14.9; Platelets 225.0;  Potassium 3.8; Sodium 138; TSH 1.13  personally reviewed    Other studies personally reviewed: Additional studies/ records that were reviewed today include: Epic notes   ASSESSMENT AND PLAN:  1.  Paroxysmal atrial fibrillation Patient has done well with only one brief episode. Continue dofetilide 250 mg BID. QT stable on ECG 08/2018. Will meed Bmet/mag at f/u visit. Continue diltiazem 120 mg daily Continue diltiazem 30 mg PRN q 4 hrs Encouraged lifestyle modification including regular physical activity and weight reduction.   This patients CHA2DS2-VASc Score and unadjusted Ischemic Stroke Rate (% per year) is equal to 0.6 % stroke rate/year from a score of 1  Above score calculated as 1 point each if present [CHF, HTN, DM, Vascular=MI/PAD/Aortic Plaque, Age if 65-74, or Male] Above score calculated as 2 points each if present [Age > 75, or Stroke/TIA/TE]  2. HTN Stable, no changes today  3. Bradycardia Asymptomatic, no changes  COVID screen The patient does not have any symptoms that suggest any further testing/ screening at this time.  Social distancing reinforced today.  Follow-up in AF clinic in 3 months.  Current medicines are reviewed at length with the patient today.   The patient does not have concerns regarding his medicines.  The following changes were made today:  none  Labs/ tests ordered today include:  No orders of the defined types were placed in this encounter.   Patient Risk:  after full review of this patients clinical status, I feel that they are at moderate risk at this time.   Today, I have spent 10 minutes with the patient with telehealth technology discussing atrial fibrillation, lifestyle changes, and COVID-19 precautions.    Gwenlyn Perking PA-C 10/02/2018 10:41 AM  [facility] [place], [place] [address] [phone]   I hereby voluntarily request, consent and authorize the [facility] and its employed or contracted physicians, physician assistants, nurse practitioners or other licensed health care professionals (the Practitioner), to provide me with telemedicine health care services (the "Services") as deemed necessary by the treating Practitioner. I acknowledge and consent to receive the Services by the Practitioner via telemedicine. I understand that the telemedicine visit will involve communicating with the Practitioner through live audiovisual communication technology and the disclosure of certain medical information by electronic transmission. I acknowledge that I have been given the opportunity to request an in-person assessment or other available alternative prior to the telemedicine visit and am voluntarily participating in the telemedicine visit.   I understand that I have the right to withhold or withdraw my consent to the use of telemedicine in the course of my care at any time, without affecting my right to future care or treatment, and that the Practitioner or I may terminate the telemedicine visit at any time. I understand that I have the right to inspect all information obtained and/or recorded in the course of the telemedicine visit and may receive copies of available information for a reasonable fee.  I understand that some of the potential risks of receiving the Services via telemedicine include:   Delay or interruption in medical evaluation due to technological equipment failure or disruption;  Information transmitted may not be sufficient (e.g. poor resolution of images) to allow for appropriate medical decision making by the Practitioner; and/or  In rare instances, security protocols could fail, causing a breach of personal health information.   Furthermore, I acknowledge that it is my responsibility to provide information about my medical history, conditions and care that is complete and accurate to the best of my ability. I acknowledge that Practitioner's  advice, recommendations, and/or decision may be based on factors not within their control, such as incomplete or inaccurate data provided by me or distortions of diagnostic images or specimens that may result from electronic transmissions. I understand that the practice of medicine is not an exact science and that Practitioner makes no warranties or guarantees regarding treatment outcomes. I acknowledge that I will receive a copy of this consent concurrently upon execution via email to the email address I last provided but may also request a printed copy by calling the office of the [facility].  I understand that my insurance will be billed for this visit.   I have read or had this consent read to me.  I understand the contents of this consent, which adequately explains the benefits and risks of the Services being provided via telemedicine.  I have been provided ample opportunity to ask questions regarding this consent and the Services and have had my questions answered  to my satisfaction.  I give my informed consent for the services to be provided through the use of telemedicine in my medical care  By participating in this telemedicine visit I agree to the above.

## 2018-10-19 ENCOUNTER — Emergency Department (HOSPITAL_COMMUNITY): Payer: BLUE CROSS/BLUE SHIELD

## 2018-10-19 ENCOUNTER — Emergency Department (HOSPITAL_COMMUNITY)
Admission: EM | Admit: 2018-10-19 | Discharge: 2018-10-19 | Disposition: A | Discharge status: Home / Self Care | Payer: BLUE CROSS/BLUE SHIELD | Attending: Emergency Medicine | Admitting: Emergency Medicine

## 2018-10-19 ENCOUNTER — Other Ambulatory Visit: Payer: Self-pay

## 2018-10-19 ENCOUNTER — Encounter (HOSPITAL_COMMUNITY): Payer: Self-pay | Admitting: Emergency Medicine

## 2018-10-19 DIAGNOSIS — Z7901 Long term (current) use of anticoagulants: Secondary | ICD-10-CM | POA: Diagnosis not present

## 2018-10-19 DIAGNOSIS — I10 Essential (primary) hypertension: Secondary | ICD-10-CM | POA: Insufficient documentation

## 2018-10-19 DIAGNOSIS — Z79899 Other long term (current) drug therapy: Secondary | ICD-10-CM | POA: Diagnosis not present

## 2018-10-19 DIAGNOSIS — R079 Chest pain, unspecified: Secondary | ICD-10-CM | POA: Diagnosis not present

## 2018-10-19 DIAGNOSIS — Z87891 Personal history of nicotine dependence: Secondary | ICD-10-CM | POA: Diagnosis not present

## 2018-10-19 DIAGNOSIS — R0789 Other chest pain: Secondary | ICD-10-CM | POA: Diagnosis not present

## 2018-10-19 LAB — CBC WITH DIFFERENTIAL/PLATELET
Abs Immature Granulocytes: 0.03 10*3/uL (ref 0.00–0.07)
Basophils Absolute: 0 10*3/uL (ref 0.0–0.1)
Basophils Relative: 0 %
Eosinophils Absolute: 0.2 10*3/uL (ref 0.0–0.5)
Eosinophils Relative: 3 %
HCT: 42.9 % (ref 39.0–52.0)
Hemoglobin: 14.5 g/dL (ref 13.0–17.0)
Immature Granulocytes: 0 %
Lymphocytes Relative: 35 %
Lymphs Abs: 3.2 10*3/uL (ref 0.7–4.0)
MCH: 29.8 pg (ref 26.0–34.0)
MCHC: 33.8 g/dL (ref 30.0–36.0)
MCV: 88.3 fL (ref 80.0–100.0)
Monocytes Absolute: 1.2 10*3/uL — ABNORMAL HIGH (ref 0.1–1.0)
Monocytes Relative: 13 %
Neutro Abs: 4.6 10*3/uL (ref 1.7–7.7)
Neutrophils Relative %: 49 %
Platelets: 209 10*3/uL (ref 150–400)
RBC: 4.86 MIL/uL (ref 4.22–5.81)
RDW: 13 % (ref 11.5–15.5)
WBC: 9.2 10*3/uL (ref 4.0–10.5)
nRBC: 0 % (ref 0.0–0.2)

## 2018-10-19 LAB — BASIC METABOLIC PANEL
Anion gap: 12 (ref 5–15)
BUN: 17 mg/dL (ref 6–20)
CO2: 21 mmol/L — ABNORMAL LOW (ref 22–32)
Calcium: 9.4 mg/dL (ref 8.9–10.3)
Chloride: 105 mmol/L (ref 98–111)
Creatinine, Ser: 1.11 mg/dL (ref 0.61–1.24)
GFR calc Af Amer: >60 mL/min (ref >60)
GFR calc non Af Amer: >60 mL/min (ref >60)
Glucose, Bld: 102 mg/dL — ABNORMAL HIGH (ref 70–99)
Potassium: 4.2 mmol/L (ref 3.5–5.1)
Sodium: 138 mmol/L (ref 135–145)

## 2018-10-19 LAB — TROPONIN I
Troponin I: <0.03 ng/mL (ref <0.03)
Troponin I: <0.03 ng/mL (ref <0.03)

## 2018-10-19 MED ORDER — NITROGLYCERIN 0.4 MG SL SUBL
0.4000 mg | SUBLINGUAL_TABLET | SUBLINGUAL | Status: DC | PRN
  Administered 2018-10-19: 04:00:00 0.4 mg via SUBLINGUAL
  Filled 2018-10-19: qty 1

## 2018-10-19 MED ORDER — ASPIRIN 81 MG PO CHEW
324.0000 mg | CHEWABLE_TABLET | Freq: Once | ORAL | Status: AC
  Administered 2018-10-19: 324 mg via ORAL
  Filled 2018-10-19: qty 4

## 2018-10-19 MED ORDER — NITROGLYCERIN 0.4 MG SL SUBL: 0.4000 mg | SUBLINGUAL_TABLET | SUBLINGUAL | 0 refills | Status: DC | PRN
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
## 2018-10-19 NOTE — ED Triage Notes (Signed)
Patient from home with chest pain that he describes as a deep, hard pain, starts in the the center of chest and radiates to left arm.  He did initially have some shortness of breath, denies any nausea or vomiting.  He states that it woke him from sleep.  Patient is in Afib, he states that he has been shocked out of it before back to NSR.  He takes Tikosyn for his Afib.

## 2018-10-19 NOTE — Discharge Instructions (Addendum)
Please take aspirin 81 mg every day until you are seen by the cardiologist.  If you have similar pain again, take the nitroglycerin tablets.  You may take up to 3 tablets 5 minutes apart until pain is completely relieved.  If pain is not relieved by 3 nitroglycerin, come to the hospital immediately.

## 2018-10-27 ENCOUNTER — Telehealth: Payer: Self-pay | Admitting: *Deleted

## 2018-10-27 DIAGNOSIS — G4733 Obstructive sleep apnea (adult) (pediatric): Secondary | ICD-10-CM | POA: Diagnosis not present

## 2018-10-27 NOTE — Telephone Encounter (Signed)
Patient complains that he is not getting enough air when he is in a deep sleep. He checks his machine when he wakes and it is on 8. Would like for you to adjust his pressure as needed. Please advise

## 2018-10-27 NOTE — Telephone Encounter (Signed)
Please get a download

## 2018-10-29 NOTE — Telephone Encounter (Signed)
Download printed and sent to be scanned

## 2018-11-03 DIAGNOSIS — G4733 Obstructive sleep apnea (adult) (pediatric): Secondary | ICD-10-CM | POA: Diagnosis not present

## 2018-11-06 ENCOUNTER — Telehealth: Payer: Self-pay

## 2018-11-13 ENCOUNTER — Other Ambulatory Visit: Payer: Self-pay

## 2018-11-13 ENCOUNTER — Telehealth (INDEPENDENT_AMBULATORY_CARE_PROVIDER_SITE_OTHER): Payer: BC Managed Care – PPO | Admitting: Internal Medicine

## 2018-11-13 ENCOUNTER — Encounter: Payer: Self-pay | Admitting: Internal Medicine

## 2018-11-13 VITALS — BP 117/79 | HR 57 | Ht 72.0 in | Wt 204.0 lb

## 2018-11-13 DIAGNOSIS — I10 Essential (primary) hypertension: Secondary | ICD-10-CM

## 2018-11-13 DIAGNOSIS — I480 Paroxysmal atrial fibrillation: Secondary | ICD-10-CM

## 2018-11-13 DIAGNOSIS — R0789 Other chest pain: Secondary | ICD-10-CM | POA: Diagnosis not present

## 2018-11-13 DIAGNOSIS — R079 Chest pain, unspecified: Secondary | ICD-10-CM

## 2018-11-13 NOTE — Progress Notes (Signed)
Electrophysiology TeleHealth Note   Due to national recommendations of social distancing due to [place] 19, an audio telehealth visit is felt to be most appropriate for this patient at this time.  Verbal consent was obtained by me for the telehealth visit today.  The patient does not have capability for a virtual visit.  A phone visit is therefore required today.   Date:  11/13/2018   ID:  [name], DOB Jan 08, 1961, MRN [id number]  Location: patient's home  Provider location:  [facility]  Evaluation Performed: Follow-up visit  PCP:  Biagio Borg, MD   Electrophysiologist:  Dr Rayann Heman  Chief Complaint:  AF/chest pain follow up  History of Present Illness:    BHARGAV BARBARO is a 58 y.o. male who presents via telehealth conferencing today.  Since last being seen in our clinic, the patient reports doing relatively well. He was seen in the ER for evaluation of chest pain.  Troponin was negative, he was given prn NTG for recurrent chest pain. Since that time, he denies symptoms of palpitations, chest pain, shortness of breath,  lower extremity edema, dizziness, presyncope, or syncope.  The patient is otherwise without complaint today.  The patient denies symptoms of fevers, chills, cough, or new SOB worrisome for COVID 19.  Past Medical History:  Diagnosis Date  . Arthritis    "maybe in my hands" (10/31/2016)  . Atrial flutter ([place])   . CHEST PAIN-PRECORDIAL 05/25/2009  . ERECTILE DYSFUNCTION, ORGANIC 01/16/2010  . GERD (gastroesophageal reflux disease)   . HYPERLIPIDEMIA 12/15/2007  . HYPERTENSION 12/15/2007  . HYPOKALEMIA 12/15/2007  . Internal hemorrhoids   . Persistent atrial fibrillation    a. prior ablation/flecainide; placed on Tikosyn 07/2017.  Marland Kitchen POSTURAL LIGHTHEADEDNESS 07/06/2008  . Sinus bradycardia   . Visit for monitoring Tikosyn therapy 07/03/2017    Past Surgical History:  Procedure Laterality Date  . ATRIAL FIBRILLATION ABLATION N/A 08/13/2011   Procedure:  ATRIAL FIBRILLATION ABLATION;  Surgeon: Thompson Grayer, MD;  [facility] CATH LAB;  Service: Cardiovascular;  Laterality: N/A;  . ATRIAL FIBRILLATION ABLATION  10/31/2016  . ATRIAL FIBRILLATION ABLATION N/A 10/31/2016   Procedure: Atrial Fibrillation Ablation;  Surgeon: Thompson Grayer, MD;  Location: [place] CV LAB;  Service: Cardiovascular;  Laterality: N/A;  . ATRIAL FLUTTER ABLATION N/A 12/27/2011   CTI repeat ablation by Dr Rayann Heman  . CARDIOVERSION  11/21/2011   Procedure: CARDIOVERSION;  Surgeon: Larey Dresser, MD;  Location: [place];  Service: Cardiovascular;  Laterality: N/A;  . CARDIOVERSION N/A 12/02/2016   Procedure: CARDIOVERSION;  Surgeon: [name], MD;  Location: [place];  Service: Cardiovascular;  Laterality: N/A;  . CHALAZION EXCISION Right   . ELBOW SURGERY Right early 1980s   bone spur/chip; "opened me up"  . INGUINAL HERNIA REPAIR Right 11/2006  . TEE WITHOUT CARDIOVERSION  08/12/2011   Procedure: TRANSESOPHAGEAL ECHOCARDIOGRAM (TEE);  Surgeon: Fay Records, MD;  Location: [facility] ENDOSCOPY;  Service: Cardiovascular;  Laterality: N/A;  . TEE WITHOUT CARDIOVERSION N/A 10/30/2016   Procedure: TRANSESOPHAGEAL ECHOCARDIOGRAM (TEE);  Surgeon: Josue Hector, MD;  Location[facility] ENDOSCOPY;  Service: Cardiovascular;  Laterality: N/A;  . TEE WITHOUT CARDIOVERSION N/A 12/02/2016   Procedure: TRANSESOPHAGEAL ECHOCARDIOGRAM (TEE);  Surgeon: [name], MD;  Location: [facility] ENDOSCOPY;  Service: Cardiovascular;  Laterality: N/A;    Current Outpatient Medications  Medication Sig Dispense Refill  . amLODipine (NORVASC) 2.5 MG tablet Take 1 tablet (2.5 mg total) by mouth daily. 90 tablet  3  . apixaban (ELIQUIS) 5 MG TABS tablet Take 1 tablet (5 mg total) by mouth 2 (two) times daily. 60 tablet 5  . Arginine 500 MG CAPS Take 500 mg by mouth daily.     Marland Kitchen atorvastatin (LIPITOR) 20 MG tablet Take 1 tablet (20 mg total) by mouth daily. 90 tablet 3  . cholecalciferol (VITAMIN D-400) 10 MCG  (400 UNIT) TABS tablet Take 400 Units by mouth daily.     Marland Kitchen diltiazem (CARDIZEM CD) 120 MG 24 hr capsule Take 1 capsule (120 mg total) by mouth daily. TAKE 1 CAPSULE BY MOUTH EVERY DAY 90 capsule 3  . diltiazem (CARDIZEM) 30 MG tablet Take 1 tablet every 4 hours AS NEEDED for heart rate over 100 45 tablet 2  . dofetilide (TIKOSYN) 250 MCG capsule Take 1 capsule (250 mcg total) by mouth 2 (two) times daily. 60 capsule 6  . doxazosin (CARDURA) 4 MG tablet Take 1 tablet (4 mg total) by mouth at bedtime. 90 tablet 3  . irbesartan (AVAPRO) 300 MG tablet Take 1 tablet (300 mg total) by mouth daily. 90 tablet 3  . Lysine HCl 500 MG TABS Take 500 mg by mouth daily.     . Multiple Vitamins-Minerals (MULTIVITAL) tablet Take 1 tablet by mouth daily.    . nitroGLYCERIN (NITROSTAT) 0.4 MG SL tablet Place 1 tablet (0.4 mg total) under the tongue every 5 (five) minutes as needed for chest pain. 30 tablet 0  . vitamin C (ASCORBIC ACID) 250 MG tablet Take 250 mg by mouth daily.     No current facility-administered medications for this visit.     Allergies:   Ibuprofen   Social History:  The patient  reports that he quit smoking about 7 years ago. His smoking use included cigarettes. He has a 1.50 pack-year smoking history. He has never used smokeless tobacco. He reports current alcohol use. He reports that he does not use drugs.   Family History:  The patient's  family history includes Heart disease in his sister; Other in his father; Sleep apnea in his sister; Stroke in his mother; Thyroid disease in his sister.   ROS:  Please see the history of present illness.   All other systems are personally reviewed and negative.    Exam:    Vital Signs:  BP 117/79   Pulse (!) 57   Ht 6' (1.829 m)   Wt 204 lb (92.5 kg)   BMI 27.67 kg/m   Well sounding today   Labs/Other Tests and Data Reviewed:    Recent Labs: 07/01/2018: Magnesium 2.0 09/25/2018: ALT 29; TSH 1.13 10/19/2018: BUN 17; Creatinine, Ser 1.11;  Hemoglobin 14.5; Platelets 209; Potassium 4.2; Sodium 138   Wt Readings from Last 3 Encounters:  11/13/18 204 lb (92.5 kg)  10/19/18 205 lb (93 kg)  10/02/18 205 lb (93 kg)         ASSESSMENT & PLAN:    1.  Persistent atrial fibrillation Stable on Tikosyn and Diltiazem He feels that he is mostly maintaining SR  Continue Warfarin  BMET from ER visit reviewed   2.  HTN Stable No change required today  3.  Chest pain Will obtain cardiac CT to evaluate further   Follow-up:  With Adline Peals in AF clinic 6 weeks   Patient Risk:  after full review of this patients clinical status, I feel that they are at moderate risk at this time.  Today, I have spent 15 minutes with the patient with telehealth  technology discussing arrhythmia management .    Army Fossa, MD  11/13/2018 3:08 PM     [facility] [address] [place] [address] [phone] (office) [phone] (fax)

## 2018-11-26 ENCOUNTER — Encounter: Payer: Self-pay | Admitting: Internal Medicine

## 2018-11-27 DIAGNOSIS — G4733 Obstructive sleep apnea (adult) (pediatric): Secondary | ICD-10-CM | POA: Diagnosis not present

## 2018-12-03 DIAGNOSIS — G5762 Lesion of plantar nerve, left lower limb: Secondary | ICD-10-CM | POA: Diagnosis not present

## 2018-12-03 DIAGNOSIS — M7752 Other enthesopathy of left foot: Secondary | ICD-10-CM | POA: Diagnosis not present

## 2018-12-10 ENCOUNTER — Other Ambulatory Visit: Payer: Self-pay

## 2018-12-10 ENCOUNTER — Ambulatory Visit (HOSPITAL_COMMUNITY)
Admission: RE | Admit: 2018-12-10 | Discharge: 2018-12-10 | Disposition: A | Discharge status: Home / Self Care | Payer: BC Managed Care – PPO | Source: Ambulatory Visit | Attending: Internal Medicine | Admitting: Internal Medicine

## 2018-12-10 DIAGNOSIS — R079 Chest pain, unspecified: Secondary | ICD-10-CM

## 2018-12-10 DIAGNOSIS — R0789 Other chest pain: Secondary | ICD-10-CM | POA: Diagnosis not present

## 2018-12-10 MED ORDER — IOHEXOL 350 MG/ML SOLN
100.0000 mL | Freq: Once | INTRAVENOUS | Status: AC | PRN
  Administered 2018-12-10: 15:00:00 100 mL via INTRAVENOUS

## 2018-12-10 MED ORDER — NITROGLYCERIN 0.4 MG SL SUBL
SUBLINGUAL_TABLET | SUBLINGUAL | Status: AC
  Filled 2018-12-10: qty 2

## 2018-12-10 MED ORDER — NITROGLYCERIN 0.4 MG SL SUBL
0.8000 mg | SUBLINGUAL_TABLET | Freq: Once | SUBLINGUAL | Status: AC
  Administered 2018-12-10: 0.8 mg via SUBLINGUAL

## 2018-12-11 DIAGNOSIS — M7752 Other enthesopathy of left foot: Secondary | ICD-10-CM | POA: Diagnosis not present

## 2018-12-11 DIAGNOSIS — G5762 Lesion of plantar nerve, left lower limb: Secondary | ICD-10-CM | POA: Diagnosis not present

## 2018-12-21 DIAGNOSIS — M7752 Other enthesopathy of left foot: Secondary | ICD-10-CM | POA: Diagnosis not present

## 2018-12-21 DIAGNOSIS — G5762 Lesion of plantar nerve, left lower limb: Secondary | ICD-10-CM | POA: Diagnosis not present

## 2018-12-27 DIAGNOSIS — G4733 Obstructive sleep apnea (adult) (pediatric): Secondary | ICD-10-CM | POA: Diagnosis not present

## 2018-12-29 ENCOUNTER — Ambulatory Visit (HOSPITAL_COMMUNITY): Payer: BC Managed Care – PPO | Admitting: Nurse Practitioner

## 2019-01-06 ENCOUNTER — Encounter (HOSPITAL_COMMUNITY): Payer: Self-pay | Admitting: Nurse Practitioner

## 2019-01-06 ENCOUNTER — Ambulatory Visit (HOSPITAL_COMMUNITY)
Admission: RE | Admit: 2019-01-06 | Discharge: 2019-01-06 | Disposition: A | Discharge status: Home / Self Care | Payer: BC Managed Care – PPO | Source: Ambulatory Visit | Attending: Physician Assistant | Admitting: Physician Assistant

## 2019-01-06 ENCOUNTER — Other Ambulatory Visit: Payer: Self-pay

## 2019-01-06 VITALS — BP 118/72 | HR 59 | Ht 72.0 in | Wt 197.0 lb

## 2019-01-06 DIAGNOSIS — Z886 Allergy status to analgesic agent status: Secondary | ICD-10-CM | POA: Insufficient documentation

## 2019-01-06 DIAGNOSIS — M1990 Unspecified osteoarthritis, unspecified site: Secondary | ICD-10-CM | POA: Insufficient documentation

## 2019-01-06 DIAGNOSIS — Z87891 Personal history of nicotine dependence: Secondary | ICD-10-CM | POA: Insufficient documentation

## 2019-01-06 DIAGNOSIS — Z8349 Family history of other endocrine, nutritional and metabolic diseases: Secondary | ICD-10-CM | POA: Insufficient documentation

## 2019-01-06 DIAGNOSIS — Z823 Family history of stroke: Secondary | ICD-10-CM | POA: Diagnosis not present

## 2019-01-06 DIAGNOSIS — I480 Paroxysmal atrial fibrillation: Secondary | ICD-10-CM | POA: Diagnosis not present

## 2019-01-06 DIAGNOSIS — Z7901 Long term (current) use of anticoagulants: Secondary | ICD-10-CM | POA: Insufficient documentation

## 2019-01-06 DIAGNOSIS — Z8249 Family history of ischemic heart disease and other diseases of the circulatory system: Secondary | ICD-10-CM | POA: Diagnosis not present

## 2019-01-06 DIAGNOSIS — I4891 Unspecified atrial fibrillation: Secondary | ICD-10-CM | POA: Diagnosis not present

## 2019-01-06 DIAGNOSIS — E785 Hyperlipidemia, unspecified: Secondary | ICD-10-CM | POA: Insufficient documentation

## 2019-01-06 DIAGNOSIS — I10 Essential (primary) hypertension: Secondary | ICD-10-CM | POA: Diagnosis not present

## 2019-01-06 DIAGNOSIS — Z79899 Other long term (current) drug therapy: Secondary | ICD-10-CM | POA: Diagnosis not present

## 2019-01-06 LAB — BASIC METABOLIC PANEL
Anion gap: 6 (ref 5–15)
BUN: 18 mg/dL (ref 6–20)
CO2: 24 mmol/L (ref 22–32)
Calcium: 9.3 mg/dL (ref 8.9–10.3)
Chloride: 108 mmol/L (ref 98–111)
Creatinine, Ser: 1.14 mg/dL (ref 0.61–1.24)
GFR calc Af Amer: >60 mL/min (ref >60)
GFR calc non Af Amer: >60 mL/min (ref >60)
Glucose, Bld: 112 mg/dL — ABNORMAL HIGH (ref 70–99)
Potassium: 4.3 mmol/L (ref 3.5–5.1)
Sodium: 138 mmol/L (ref 135–145)

## 2019-01-06 LAB — MAGNESIUM: Magnesium: 2.2 mg/dL (ref 1.7–2.4)

## 2019-01-06 NOTE — Progress Notes (Signed)
Primary Care Physician: Biagio Borg, MD Referring Physician: Luana Shu is a 58 y.o. male with a h/o afib in the afib clinic for Tikosyn surveillance. He reports that he has not noted any sustained afib. He is being compliant with  Tikosyn. Is  being compliant as well with eliquis. No bleeding issues.  Today, he denies symptoms of palpitations, chest pain, shortness of breath, orthopnea, PND, lower extremity edema, dizziness, presyncope, syncope, or neurologic sequela. The patient is tolerating medications without difficulties and is otherwise without complaint today.   Past Medical History:  Diagnosis Date  . Arthritis    "maybe in my hands" (10/31/2016)  . Atrial flutter ([place])   . CHEST PAIN-PRECORDIAL 05/25/2009  . ERECTILE DYSFUNCTION, ORGANIC 01/16/2010  . GERD (gastroesophageal reflux disease)   . HYPERLIPIDEMIA 12/15/2007  . HYPERTENSION 12/15/2007  . HYPOKALEMIA 12/15/2007  . Internal hemorrhoids   . Persistent atrial fibrillation    a. prior ablation/flecainide; placed on Tikosyn 07/2017.  Marland Kitchen POSTURAL LIGHTHEADEDNESS 07/06/2008  . Sinus bradycardia   . Visit for monitoring Tikosyn therapy 07/03/2017   Past Surgical History:  Procedure Laterality Date  . ATRIAL FIBRILLATION ABLATION N/A 08/13/2011   Procedure: ATRIAL FIBRILLATION ABLATION;  Surgeon: Thompson Grayer, MD;  Location: [facility] CATH LAB;  Service: Cardiovascular;  Laterality: N/A;  . ATRIAL FIBRILLATION ABLATION  10/31/2016  . ATRIAL FIBRILLATION ABLATION N/A 10/31/2016   Procedure: Atrial Fibrillation Ablation;  Surgeon: Thompson Grayer, MD;  Location: [place] CV LAB;  Service: Cardiovascular;  Laterality: N/A;  . ATRIAL FLUTTER ABLATION N/A 12/27/2011   CTI repeat ablation by Dr Rayann Heman  . CARDIOVERSION  11/21/2011   Procedure: CARDIOVERSION;  Surgeon: Larey Dresser, MD;  Location: [place];  Service: Cardiovascular;  Laterality: N/A;  . CARDIOVERSION N/A 12/02/2016   Procedure: CARDIOVERSION;  Surgeon:  [name], MD;  Location: [place];  Service: Cardiovascular;  Laterality: N/A;  . CHALAZION EXCISION Right   . ELBOW SURGERY Right early 1980s   bone spur/chip; "opened me up"  . INGUINAL HERNIA REPAIR Right 11/2006  . TEE WITHOUT CARDIOVERSION  08/12/2011   Procedure: TRANSESOPHAGEAL ECHOCARDIOGRAM (TEE);  Surgeon: Fay Records, MD;  Location: [facility] ENDOSCOPY;  Service: Cardiovascular;  Laterality: N/A;  . TEE WITHOUT CARDIOVERSION N/A 10/30/2016   Procedure: TRANSESOPHAGEAL ECHOCARDIOGRAM (TEE);  Surgeon: Josue Hector, MD;  Location: [facility] ENDOSCOPY;  Service: Cardiovascular;  Laterality: N/A;  . TEE WITHOUT CARDIOVERSION N/A 12/02/2016   Procedure: TRANSESOPHAGEAL ECHOCARDIOGRAM (TEE);  Surgeon: [name], MD;  Location: [facility] ENDOSCOPY;  Service: Cardiovascular;  Laterality: N/A;    Current Outpatient Medications  Medication Sig Dispense Refill  . amLODipine (NORVASC) 2.5 MG tablet Take 1 tablet (2.5 mg total) by mouth daily. 90 tablet 3  . apixaban (ELIQUIS) 5 MG TABS tablet Take 1 tablet (5 mg total) by mouth 2 (two) times daily. 60 tablet 5  . Arginine 500 MG CAPS Take 500 mg by mouth daily.     Marland Kitchen atorvastatin (LIPITOR) 20 MG tablet Take 1 tablet (20 mg total) by mouth daily. 90 tablet 3  . cholecalciferol (VITAMIN D-400) 10 MCG (400 UNIT) TABS tablet Take 400 Units by mouth daily.     Marland Kitchen diltiazem (CARDIZEM CD) 120 MG 24 hr capsule Take 1 capsule (120 mg total) by mouth daily. TAKE 1 CAPSULE BY MOUTH EVERY DAY 90 capsule 3  . dofetilide (TIKOSYN) 250 MCG capsule Take 1 capsule (250 mcg total) by mouth 2 (two) times daily. [place]  capsule 6  . doxazosin (CARDURA) 4 MG tablet Take 1 tablet (4 mg total) by mouth at bedtime. 90 tablet 3  . irbesartan (AVAPRO) 300 MG tablet Take 1 tablet (300 mg total) by mouth daily. 90 tablet 3  . Lysine HCl 500 MG TABS Take 500 mg by mouth daily.     . Multiple Vitamins-Minerals (MULTIVITAL) tablet Take 1 tablet by mouth daily.    . vitamin C  (ASCORBIC ACID) 250 MG tablet Take 250 mg by mouth daily.    Marland Kitchen diltiazem (CARDIZEM) 30 MG tablet Take 1 tablet every 4 hours AS NEEDED for heart rate over 100 (Patient not taking: Reported on 01/06/2019) 45 tablet 2  . nitroGLYCERIN (NITROSTAT) 0.4 MG SL tablet Place 1 tablet (0.4 mg total) under the tongue every 5 (five) minutes as needed for chest pain. (Patient not taking: Reported on 01/06/2019) 30 tablet 0   No current facility-administered medications for this encounter.     Allergies  Allergen Reactions  . Ibuprofen Palpitations and Other (See Comments)    Rapid heart beat    Social History   Socioeconomic History  . Marital status: Married    Spouse name: Not on file  . Number of children: 2  . Years of education: Not on file  . Highest education level: Not on file  Occupational History  . Occupation: SECURITY    Employer: [employer]  . Financial resource strain: Not on file  . Food insecurity    Worry: Not on file    Inability: Not on file  . Transportation needs    Medical: Not on file    Non-medical: Not on file  Tobacco Use  . Smoking status: Former Smoker    Packs/day: 0.10    Years: 15.00    Pack years: 1.50    Types: Cigarettes    Quit date: 12/17/2010    Years since quitting: 8.0  . Smokeless tobacco: Never Used  Substance and Sexual Activity  . Alcohol use: Yes    Comment: "1 glass of wine/month maybe" (10/31/2016)  . Drug use: No  . Sexual activity: Not Currently  Lifestyle  . Physical activity    Days per week: Not on file    Minutes per session: Not on file  . Stress: Not on file  Relationships  . Social Herbalist on phone: Not on file    Gets together: Not on file    Attends religious service: Not on file    Active member of club or organization: Not on file    Attends meetings of clubs or organizations: Not on file    Relationship status: Not on file  . Intimate partner violence    Fear of current or ex  partner: Not on file    Emotionally abused: Not on file    Physically abused: Not on file    Forced sexual activity: Not on file  Other Topics Concern  . Not on file  Social History Narrative   Pt lives in [place] with spouse. 2 grown children.   Works at [employer] in [place].    Family History  Problem Relation Age of Onset  . Stroke Mother   . Heart disease Sister   . Thyroid disease Sister   . Sleep apnea Sister   . Other Father        deceased stomach hemorrhage  . Anesthesia problems Neg Hx     ROS- All systems are reviewed  and negative except as per the HPI above  Physical Exam: Vitals:   01/06/19 1016  BP: 118/72  Pulse: (!) 59  Weight: 89.4 kg  Height: 6' (1.829 m)   Wt Readings from Last 3 Encounters:  01/06/19 89.4 kg  11/13/18 92.5 kg  10/19/18 93 kg    Labs: Lab Results  Component Value Date   NA 138 10/19/2018   K 4.2 10/19/2018   CL 105 10/19/2018   CO2 21 (L) 10/19/2018   GLUCOSE 102 (H) 10/19/2018   BUN 17 10/19/2018   CREATININE 1.11 10/19/2018   CALCIUM 9.4 10/19/2018   MG 2.0 07/01/2018   Lab Results  Component Value Date   INR 1.8 (A) 09/11/2018   Lab Results  Component Value Date   CHOL 173 09/25/2018   HDL 57.00 09/25/2018   LDLCALC 85 09/25/2018   TRIG 158.0 (H) 09/25/2018     GEN- The patient is well appearing, alert and oriented x 3 today.   Head- normocephalic, atraumatic Eyes-  Sclera clear, conjunctiva pink Ears- hearing intact Oropharynx- clear Neck- supple, no JVP Lymph- no cervical lymphadenopathy Lungs- Clear to ausculation bilaterally, normal work of breathing Heart- slow regular rate and rhythm, no murmurs, rubs or gallops, PMI not laterally displaced GI- soft, NT, ND, + BS Extremities- no clubbing, cyanosis, or edema MS- no significant deformity or atrophy Skin- no rash or lesion Psych- euthymic mood, full affect Neuro- strength and sensation are intact  EKG- Sinus brady at 59 bpm, with  one PVC, pr omt 156 ms, qrs int 80 ms, qtc 459 ms     Assessment and Plan: 1. Afib  Staying in SR with Tikosyn, qtc stable Continue dofetilide 250 mcgs bid and diltiazem 120 mg qd  2. CHA2DS2VASc score of 1 Continue eliquis 5 mg bid   3. HTN Stable  Chistine Dematteo C. Krist Rosenboom, [facility] [address] [place], [place] [address] [phone]

## 2019-01-18 ENCOUNTER — Other Ambulatory Visit (HOSPITAL_COMMUNITY): Payer: Self-pay | Admitting: *Deleted

## 2019-01-18 MED ORDER — DOFETILIDE 250 MCG PO CAPS: 250.0000 ug | ORAL_CAPSULE | Freq: Two times a day (BID) | ORAL | 6 refills | Status: DC

## 2019-02-17 ENCOUNTER — Other Ambulatory Visit (HOSPITAL_COMMUNITY): Payer: Self-pay | Admitting: *Deleted

## 2019-02-17 MED ORDER — DOFETILIDE 250 MCG PO CAPS: 250.0000 ug | ORAL_CAPSULE | Freq: Two times a day (BID) | ORAL | 2 refills | Status: DC

## 2019-03-03 DIAGNOSIS — Z23 Encounter for immunization: Secondary | ICD-10-CM | POA: Diagnosis not present

## 2019-03-11 ENCOUNTER — Other Ambulatory Visit: Payer: Self-pay | Admitting: Internal Medicine

## 2019-03-12 NOTE — Telephone Encounter (Signed)
Pt last saw Clint Fenton, PA on 01/06/19, last labs 01/06/19 Creat 1.14, age 58, weight 89.4kg, based on specified criteria pt is on appropriate dosage of Eliquis 5mg  BID.  Will refill rx.

## 2019-07-14 ENCOUNTER — Ambulatory Visit (HOSPITAL_COMMUNITY)
Admission: RE | Admit: 2019-07-14 | Discharge: 2019-07-14 | Disposition: A | Discharge status: Home / Self Care | Payer: 59 | Source: Ambulatory Visit | Attending: Nurse Practitioner | Admitting: Nurse Practitioner

## 2019-07-14 ENCOUNTER — Other Ambulatory Visit: Payer: Self-pay

## 2019-07-14 ENCOUNTER — Encounter (HOSPITAL_COMMUNITY): Payer: Self-pay | Admitting: Nurse Practitioner

## 2019-07-14 ENCOUNTER — Ambulatory Visit (HOSPITAL_COMMUNITY): Payer: BC Managed Care – PPO | Admitting: Nurse Practitioner

## 2019-07-14 VITALS — BP 122/82 | HR 52 | Ht 72.0 in | Wt 203.8 lb

## 2019-07-14 DIAGNOSIS — E785 Hyperlipidemia, unspecified: Secondary | ICD-10-CM | POA: Diagnosis not present

## 2019-07-14 DIAGNOSIS — I4891 Unspecified atrial fibrillation: Secondary | ICD-10-CM | POA: Diagnosis not present

## 2019-07-14 DIAGNOSIS — Z7901 Long term (current) use of anticoagulants: Secondary | ICD-10-CM | POA: Insufficient documentation

## 2019-07-14 DIAGNOSIS — Z79899 Other long term (current) drug therapy: Secondary | ICD-10-CM | POA: Diagnosis not present

## 2019-07-14 DIAGNOSIS — Z886 Allergy status to analgesic agent status: Secondary | ICD-10-CM | POA: Insufficient documentation

## 2019-07-14 DIAGNOSIS — I10 Essential (primary) hypertension: Secondary | ICD-10-CM | POA: Insufficient documentation

## 2019-07-14 DIAGNOSIS — Z8349 Family history of other endocrine, nutritional and metabolic diseases: Secondary | ICD-10-CM | POA: Diagnosis not present

## 2019-07-14 DIAGNOSIS — Z823 Family history of stroke: Secondary | ICD-10-CM | POA: Diagnosis not present

## 2019-07-14 DIAGNOSIS — D6869 Other thrombophilia: Secondary | ICD-10-CM

## 2019-07-14 DIAGNOSIS — I480 Paroxysmal atrial fibrillation: Secondary | ICD-10-CM | POA: Diagnosis not present

## 2019-07-14 DIAGNOSIS — Z8249 Family history of ischemic heart disease and other diseases of the circulatory system: Secondary | ICD-10-CM | POA: Insufficient documentation

## 2019-07-14 DIAGNOSIS — Z87891 Personal history of nicotine dependence: Secondary | ICD-10-CM | POA: Insufficient documentation

## 2019-07-14 LAB — BASIC METABOLIC PANEL
Anion gap: 9 (ref 5–15)
BUN: 17 mg/dL (ref 6–20)
CO2: 23 mmol/L (ref 22–32)
Calcium: 9.2 mg/dL (ref 8.9–10.3)
Chloride: 106 mmol/L (ref 98–111)
Creatinine, Ser: 1.05 mg/dL (ref 0.61–1.24)
GFR calc Af Amer: >60 mL/min (ref >60)
GFR calc non Af Amer: >60 mL/min (ref >60)
Glucose, Bld: 109 mg/dL — ABNORMAL HIGH (ref 70–99)
Potassium: 4.1 mmol/L (ref 3.5–5.1)
Sodium: 138 mmol/L (ref 135–145)

## 2019-07-14 LAB — MAGNESIUM: Magnesium: 1.9 mg/dL (ref 1.7–2.4)

## 2019-07-14 NOTE — Progress Notes (Signed)
Primary Care Physician: Biagio Borg, MD Referring Physician: Luana Shu is a 59 y.o. male with a h/o afib in the afib clinic for Tikosyn surveillance. He reports that he has not noted any sustained afib. He is being compliant with  Tikosyn. and  eliquis with a CHA2DS2VASc score of 1. No bleeding issues.  Today, he denies symptoms of palpitations, chest pain, shortness of breath, orthopnea, PND, lower extremity edema, dizziness, presyncope, syncope, or neurologic sequela. The patient is tolerating medications without difficulties and is otherwise without complaint today.   Past Medical History:  Diagnosis Date  . Arthritis    "maybe in my hands" (10/31/2016)  . Atrial flutter ([employer])   . CHEST PAIN-PRECORDIAL 05/25/2009  . ERECTILE DYSFUNCTION, ORGANIC 01/16/2010  . GERD (gastroesophageal reflux disease)   . HYPERLIPIDEMIA 12/15/2007  . HYPERTENSION 12/15/2007  . HYPOKALEMIA 12/15/2007  . Internal hemorrhoids   . Persistent atrial fibrillation    a. prior ablation/flecainide; placed on Tikosyn 07/2017.  Marland Kitchen POSTURAL LIGHTHEADEDNESS 07/06/2008  . Sinus bradycardia   . Visit for monitoring Tikosyn therapy 07/03/2017   Past Surgical History:  Procedure Laterality Date  . ATRIAL FIBRILLATION ABLATION N/A 08/13/2011   Procedure: ATRIAL FIBRILLATION ABLATION;  Surgeon: Thompson Grayer, MD;  Location: [facility] CATH LAB;  Service: Cardiovascular;  Laterality: N/A;  . ATRIAL FIBRILLATION ABLATION  10/31/2016  . ATRIAL FIBRILLATION ABLATION N/A 10/31/2016   Procedure: Atrial Fibrillation Ablation;  Surgeon: Thompson Grayer, MD;  Location: [place] CV LAB;  Service: Cardiovascular;  Laterality: N/A;  . ATRIAL FLUTTER ABLATION N/A 12/27/2011   CTI repeat ablation by Dr Rayann Heman  . CARDIOVERSION  11/21/2011   Procedure: CARDIOVERSION;  Surgeon: Larey Dresser, MD;  Location: [place];  Service: Cardiovascular;  Laterality: N/A;  . CARDIOVERSION N/A 12/02/2016   Procedure: CARDIOVERSION;  Surgeon:  [name], MD;  Location: [place];  Service: Cardiovascular;  Laterality: N/A;  . CHALAZION EXCISION Right   . ELBOW SURGERY Right early 1980s   bone spur/chip; "opened me up"  . INGUINAL HERNIA REPAIR Right 11/2006  . TEE WITHOUT CARDIOVERSION  08/12/2011   Procedure: TRANSESOPHAGEAL ECHOCARDIOGRAM (TEE);  Surgeon: Fay Records, MD;  Location: [facility] ENDOSCOPY;  Service: Cardiovascular;  Laterality: N/A;  . TEE WITHOUT CARDIOVERSION N/A 10/30/2016   Procedure: TRANSESOPHAGEAL ECHOCARDIOGRAM (TEE);  Surgeon: Josue Hector, MD;  Location: [facility] ENDOSCOPY;  Service: Cardiovascular;  Laterality: N/A;  . TEE WITHOUT CARDIOVERSION N/A 12/02/2016   Procedure: TRANSESOPHAGEAL ECHOCARDIOGRAM (TEE);  Surgeon: [name], MD;  Location: [facility] ENDOSCOPY;  Service: Cardiovascular;  Laterality: N/A;    Current Outpatient Medications  Medication Sig Dispense Refill  . amLODipine (NORVASC) 2.5 MG tablet Take 1 tablet (2.5 mg total) by mouth daily. 90 tablet 3  . Arginine 500 MG CAPS Take 500 mg by mouth daily.     . Ascorbic Acid (VITAMIN C) 1000 MG tablet Take 1,000 mg by mouth daily.     Marland Kitchen atorvastatin (LIPITOR) 20 MG tablet Take 1 tablet (20 mg total) by mouth daily. 90 tablet 3  . cholecalciferol (VITAMIN D-400) 10 MCG (400 UNIT) TABS tablet Take 400 Units by mouth daily.     Marland Kitchen diltiazem (CARDIZEM CD) 120 MG 24 hr capsule Take 1 capsule (120 mg total) by mouth daily. TAKE 1 CAPSULE BY MOUTH EVERY DAY (Patient taking differently: Take 120 mg by mouth daily. ) 90 capsule 3  . diltiazem (CARDIZEM) 30 MG tablet Take 1 tablet every 4 hours AS  NEEDED for heart rate over 100 45 tablet 2  . dofetilide (TIKOSYN) 250 MCG capsule Take 1 capsule (250 mcg total) by mouth 2 (two) times daily. 180 capsule 2  . doxazosin (CARDURA) 4 MG tablet Take 1 tablet (4 mg total) by mouth at bedtime. 90 tablet 3  . ELIQUIS 5 MG TABS tablet Take 1 tablet by mouth twice daily 60 tablet 6  . irbesartan (AVAPRO) 300 MG tablet  Take 1 tablet (300 mg total) by mouth daily. 90 tablet 3  . Lysine HCl 500 MG TABS Take 500 mg by mouth daily.     . Multiple Vitamins-Minerals (MULTIVITAL) tablet Take 1 tablet by mouth daily.    . nitroGLYCERIN (NITROSTAT) 0.4 MG SL tablet Place 1 tablet (0.4 mg total) under the tongue every 5 (five) minutes as needed for chest pain. 30 tablet 0   No current facility-administered medications for this encounter.    Allergies  Allergen Reactions  . Ibuprofen Palpitations and Other (See Comments)    Rapid heart beat    Social History   Socioeconomic History  . Marital status: Married    Spouse name: Not on file  . Number of children: 2  . Years of education: Not on file  . Highest education level: Not on file  Occupational History  . Occupation: SECURITY    Employer: [employer]  Tobacco Use  . Smoking status: Former Smoker    Packs/day: 0.10    Years: 15.00    Pack years: 1.50    Types: Cigarettes    Quit date: 12/17/2010    Years since quitting: 8.5  . Smokeless tobacco: Never Used  Substance and Sexual Activity  . Alcohol use: Yes    Comment: "1 glass of wine/month maybe" (10/31/2016)  . Drug use: No  . Sexual activity: Not Currently  Other Topics Concern  . Not on file  Social History Narrative   Pt lives in [place] with spouse. 2 grown children.   Works at [employer] in [place].   Social Determinants of Health   Financial Resource Strain:   . Difficulty of Paying Living Expenses: Not on file  Food Insecurity:   . Worried About Charity fundraiser in the Last Year: Not on file  . Ran Out of Food in the Last Year: Not on file  Transportation Needs:   . Lack of Transportation (Medical): Not on file  . Lack of Transportation (Non-Medical): Not on file  Physical Activity:   . Days of Exercise per Week: Not on file  . Minutes of Exercise per Session: Not on file  Stress:   . Feeling of Stress : Not on file  Social Connections:   .  Frequency of Communication with Friends and Family: Not on file  . Frequency of Social Gatherings with Friends and Family: Not on file  . Attends Religious Services: Not on file  . Active Member of Clubs or Organizations: Not on file  . Attends Archivist Meetings: Not on file  . Marital Status: Not on file  Intimate Partner Violence:   . Fear of Current or Ex-Partner: Not on file  . Emotionally Abused: Not on file  . Physically Abused: Not on file  . Sexually Abused: Not on file    Family History  Problem Relation Age of Onset  . Stroke Mother   . Heart disease Sister   . Thyroid disease Sister   . Sleep apnea Sister   . Other Father  deceased stomach hemorrhage  . Anesthesia problems Neg Hx     ROS- All systems are reviewed and negative except as per the HPI above  Physical Exam: Vitals:   07/14/19 0930  BP: 122/82  Pulse: (!) 52  Weight: 92.4 kg  Height: 6' (1.829 m)   Wt Readings from Last 3 Encounters:  07/14/19 92.4 kg  01/06/19 89.4 kg  11/13/18 92.5 kg    Labs: Lab Results  Component Value Date   NA 138 01/06/2019   K 4.3 01/06/2019   CL 108 01/06/2019   CO2 24 01/06/2019   GLUCOSE 112 (H) 01/06/2019   BUN 18 01/06/2019   CREATININE 1.14 01/06/2019   CALCIUM 9.3 01/06/2019   MG 2.2 01/06/2019   Lab Results  Component Value Date   INR 1.8 (A) 09/11/2018   Lab Results  Component Value Date   CHOL 173 09/25/2018   HDL 57.00 09/25/2018   LDLCALC 85 09/25/2018   TRIG 158.0 (H) 09/25/2018     GEN- The patient is well appearing, alert and oriented x 3 today.   Head- normocephalic, atraumatic Eyes-  Sclera clear, conjunctiva pink Ears- hearing intact Oropharynx- clear Neck- supple, no JVP Lymph- no cervical lymphadenopathy Lungs- Clear to ausculation bilaterally, normal work of breathing Heart- slow regular rate and rhythm, no murmurs, rubs or gallops, PMI not laterally displaced GI- soft, NT, ND, + BS Extremities- no  clubbing, cyanosis, or edema MS- no significant deformity or atrophy Skin- no rash or lesion Psych- euthymic mood, full affect Neuro- strength and sensation are intact  EKG- Sinus brady at 52 bpm,  pr omt 158 ms, qrs int 84 ms, qtc 446 ms( qt stable)     Assessment and Plan: 1. Afib  Staying in SR with Tikosyn, qtc stable Continue dofetilide 250 mcg  bid and diltiazem 120 mg qd Bmet/mag today   2. CHA2DS2VASc score of 1 Continue eliquis 5 mg bid   3. HTN Stable   F/u in 6 months   Geroge Baseman. Jawad Wiacek, [facility] [address] [place], [place] [address] [phone]

## 2019-09-17 ENCOUNTER — Encounter: Payer: BLUE CROSS/BLUE SHIELD | Admitting: Internal Medicine

## 2019-09-19 ENCOUNTER — Other Ambulatory Visit: Payer: Self-pay | Admitting: Internal Medicine

## 2019-09-22 ENCOUNTER — Other Ambulatory Visit: Payer: Self-pay

## 2019-09-22 ENCOUNTER — Encounter: Payer: Self-pay | Admitting: Internal Medicine

## 2019-09-22 ENCOUNTER — Ambulatory Visit (INDEPENDENT_AMBULATORY_CARE_PROVIDER_SITE_OTHER): Payer: 59 | Admitting: Internal Medicine

## 2019-09-22 ENCOUNTER — Ambulatory Visit: Payer: 59

## 2019-09-22 VITALS — BP 120/82 | HR 52 | Temp 98.2°F | Ht 72.0 in | Wt 210.0 lb

## 2019-09-22 DIAGNOSIS — G4733 Obstructive sleep apnea (adult) (pediatric): Secondary | ICD-10-CM | POA: Insufficient documentation

## 2019-09-22 DIAGNOSIS — R079 Chest pain, unspecified: Secondary | ICD-10-CM | POA: Insufficient documentation

## 2019-09-22 DIAGNOSIS — E538 Deficiency of other specified B group vitamins: Secondary | ICD-10-CM

## 2019-09-22 DIAGNOSIS — Z23 Encounter for immunization: Secondary | ICD-10-CM

## 2019-09-22 DIAGNOSIS — N50819 Testicular pain, unspecified: Secondary | ICD-10-CM

## 2019-09-22 DIAGNOSIS — Z0000 Encounter for general adult medical examination without abnormal findings: Secondary | ICD-10-CM | POA: Diagnosis not present

## 2019-09-22 DIAGNOSIS — E559 Vitamin D deficiency, unspecified: Secondary | ICD-10-CM

## 2019-09-22 DIAGNOSIS — R739 Hyperglycemia, unspecified: Secondary | ICD-10-CM | POA: Diagnosis not present

## 2019-09-22 DIAGNOSIS — Z0001 Encounter for general adult medical examination with abnormal findings: Secondary | ICD-10-CM

## 2019-09-22 DIAGNOSIS — E611 Iron deficiency: Secondary | ICD-10-CM

## 2019-09-22 DIAGNOSIS — E785 Hyperlipidemia, unspecified: Secondary | ICD-10-CM

## 2019-09-22 DIAGNOSIS — I10 Essential (primary) hypertension: Secondary | ICD-10-CM

## 2019-09-22 HISTORY — DX: Obstructive sleep apnea (adult) (pediatric): G47.33

## 2019-09-22 LAB — VITAMIN D 25 HYDROXY (VIT D DEFICIENCY, FRACTURES): VITD: 30.92 ng/mL (ref 30.00–100.00)

## 2019-09-22 LAB — HEPATIC FUNCTION PANEL
ALT: 27 U/L (ref 0–53)
AST: 20 U/L (ref 0–37)
Albumin: 4.5 g/dL (ref 3.5–5.2)
Alkaline Phosphatase: 62 U/L (ref 39–117)
Bilirubin, Direct: 0.2 mg/dL (ref 0.0–0.3)
Total Bilirubin: 1.1 mg/dL (ref 0.2–1.2)
Total Protein: 7.5 g/dL (ref 6.0–8.3)

## 2019-09-22 LAB — URINALYSIS, ROUTINE W REFLEX MICROSCOPIC
Bilirubin Urine: NEGATIVE
Hgb urine dipstick: NEGATIVE
Ketones, ur: NEGATIVE
Leukocytes,Ua: NEGATIVE
Nitrite: NEGATIVE
RBC / HPF: NONE SEEN (ref 0–?)
Specific Gravity, Urine: 1.02 (ref 1.000–1.030)
Total Protein, Urine: NEGATIVE
Urine Glucose: NEGATIVE
Urobilinogen, UA: 0.2 (ref 0.0–1.0)
WBC, UA: NONE SEEN (ref 0–?)
pH: 5.5 (ref 5.0–8.0)

## 2019-09-22 LAB — LIPID PANEL
Cholesterol: 147 mg/dL (ref 0–200)
HDL: 53.9 mg/dL (ref >39.00)
LDL Cholesterol: 62 mg/dL (ref 0–99)
NonHDL: 93.04
Total CHOL/HDL Ratio: 3
Triglycerides: 157 mg/dL — ABNORMAL HIGH (ref 0.0–149.0)
VLDL: 31.4 mg/dL (ref 0.0–40.0)

## 2019-09-22 LAB — VITAMIN B12: Vitamin B-12: 962 pg/mL — ABNORMAL HIGH (ref 211–911)

## 2019-09-22 LAB — BASIC METABOLIC PANEL
BUN: 18 mg/dL (ref 6–23)
CO2: 27 mEq/L (ref 19–32)
Calcium: 9.6 mg/dL (ref 8.4–10.5)
Chloride: 105 mEq/L (ref 96–112)
Creatinine, Ser: 1.13 mg/dL (ref 0.40–1.50)
GFR: 80.07 mL/min (ref >60.00)
Glucose, Bld: 98 mg/dL (ref 70–99)
Potassium: 3.9 mEq/L (ref 3.5–5.1)
Sodium: 139 mEq/L (ref 135–145)

## 2019-09-22 LAB — PSA: PSA: 0.7 ng/mL (ref 0.10–4.00)

## 2019-09-22 LAB — HEMOGLOBIN A1C: Hgb A1c MFr Bld: 5.9 % (ref 4.6–6.5)

## 2019-09-22 LAB — IBC PANEL
Iron: 126 ug/dL (ref 42–165)
Saturation Ratios: 37.5 % (ref 20.0–50.0)
Transferrin: 240 mg/dL (ref 212.0–360.0)

## 2019-09-22 LAB — TSH: TSH: 1.4 u[IU]/mL (ref 0.35–4.50)

## 2019-09-22 NOTE — Assessment & Plan Note (Signed)
stable overall by history and exam, recent data reviewed with pt, and pt to continue medical treatment as before,  to f/u any worsening symptoms or concerns  

## 2019-09-22 NOTE — Assessment & Plan Note (Addendum)
Atypical, ecg reviewd, for cxr, and stress testing  I spent 31 minutes in addition to time for CPX wellness examination in preparing to see the patient by review of recent labs, imaging and procedures, obtaining and reviewing separately obtained history, communicating with the patient and family or caregiver, ordering medications, tests or procedures, and documenting clinical information in the EHR including the differential Dx, treatment, and any further evaluation and other management of CP, hyperglycemia, testicle pain, hld, osa, htn

## 2019-09-22 NOTE — Assessment & Plan Note (Signed)
Mild persistent, to f/u urology as planned

## 2019-09-22 NOTE — Patient Instructions (Addendum)
You had the Tdap tetanus shot today  You had the Prevnar 13 pneumonia shot today  Please return for Nurse Visit in 1 month for Shingles Shot #1, then come for the same in 2 mo after that for Shingles Shot #2  Please continue all other medications as before, and refills have been done if requested.  Please have the pharmacy call with any other refills you may need.  Please continue your efforts at being more active, low cholesterol diet, and weight control.  You are otherwise up to date with prevention measures today.  Please keep your appointments with your specialists as you may have planned  You will be contacted regarding the referral for: urology  Your EKG was OK today  You will be contacted regarding the referral for: stress test  Please go to the XRAY Department in the first floor for the x-ray testing  Please go to the LAB at the blood drawing area for the tests to be done  You will be contacted by phone if any changes need to be made immediately.  Otherwise, you will receive a letter about your results with an explanation, but please check with MyChart first.  Please remember to sign up for MyChart if you have not done so, as this will be important to you in the future with finding out test results, communicating by private email, and scheduling acute appointments online when needed.  Please make an Appointment to return in 6 months, or sooner if needed

## 2019-09-22 NOTE — Progress Notes (Signed)
Subjective:    Patient ID: [name] [name], male    DOB: 1960/07/10, 59 y.o.   MRN: [id number]  HPI  Here for wellness and f/u;  Overall doing ok;  Pt denies worsening SOB, DOE, wheezing, orthopnea, PND, worsening LE edema, palpitations, dizziness or syncope, but has intermittent anterio dull cp without radiation, diaphoresis, n/v palp or dizziness for several months with exertion usually.  Pt denies neurological change such as new headache, facial or extremity weakness.  Pt denies polydipsia, polyuria, or low sugar symptoms. Pt states overall good compliance with treatment and medications, good tolerability, and has been trying to follow appropriate diet.  Pt denies worsening depressive symptoms, suicidal ideation or panic. No fever, night sweats, wt loss, loss of appetite, or other constitutional symptoms.  Pt states good ability with ADL's, has low fall risk, home safety reviewed and adequate, no other significant changes in hearing or vision, and only occasionally active with exercise. Gained several lbs.with the pandemic  Also has recurrring testicle pain without fever, swelling, and Denies urinary symptoms such as dysuria, frequency, urgency, flank pain, hematuria or n/v, fever, chills.  Has seen urology last yr and advised to f/u but needs referral BP Readings from Last 3 Encounters:  09/22/19 120/82  07/14/19 122/82  01/06/19 118/72   Wt Readings from Last 3 Encounters:  09/22/19 210 lb (95.3 kg)  07/14/19 203 lb 12.8 oz (92.4 kg)  01/06/19 197 lb (89.4 kg)   Past Medical History:  Diagnosis Date  . Arthritis    "maybe in my hands" (10/31/2016)  . Atrial flutter ([place])   . CHEST PAIN-PRECORDIAL 05/25/2009  . ERECTILE DYSFUNCTION, ORGANIC 01/16/2010  . GERD (gastroesophageal reflux disease)   . HYPERLIPIDEMIA 12/15/2007  . HYPERTENSION 12/15/2007  . HYPOKALEMIA 12/15/2007  . Internal hemorrhoids   . OSA (obstructive sleep apnea) 09/22/2019  . Persistent atrial fibrillation ([place])    a.  prior ablation/flecainide; placed on Tikosyn 07/2017.  Marland Kitchen POSTURAL LIGHTHEADEDNESS 07/06/2008  . Sinus bradycardia   . Visit for monitoring Tikosyn therapy 07/03/2017   Past Surgical History:  Procedure Laterality Date  . ATRIAL FIBRILLATION ABLATION N/A 08/13/2011   Procedure: ATRIAL FIBRILLATION ABLATION;  Surgeon: Thompson Grayer, MD;  Location: [facility] CATH LAB;  Service: Cardiovascular;  Laterality: N/A;  . ATRIAL FIBRILLATION ABLATION  10/31/2016  . ATRIAL FIBRILLATION ABLATION N/A 10/31/2016   Procedure: Atrial Fibrillation Ablation;  Surgeon: Thompson Grayer, MD;  Location: [place] CV LAB;  Service: Cardiovascular;  Laterality: N/A;  . ATRIAL FLUTTER ABLATION N/A 12/27/2011   CTI repeat ablation by Dr Rayann Heman  . CARDIOVERSION  11/21/2011   Procedure: CARDIOVERSION;  Surgeon: Larey Dresser, MD;  Location: [place];  Service: Cardiovascular;  Laterality: N/A;  . CARDIOVERSION N/A 12/02/2016   Procedure: CARDIOVERSION;  Surgeon: [name], MD;  Location: [place];  Service: Cardiovascular;  Laterality: N/A;  . CHALAZION EXCISION Right   . ELBOW SURGERY Right early 1980s   bone spur/chip; "opened me up"  . INGUINAL HERNIA REPAIR Right 11/2006  . TEE WITHOUT CARDIOVERSION  08/12/2011   Procedure: TRANSESOPHAGEAL ECHOCARDIOGRAM (TEE);  Surgeon: Fay Records, MD;  Location: [facility] ENDOSCOPY;  Service: Cardiovascular;  Laterality: N/A;  . TEE WITHOUT CARDIOVERSION N/A 10/30/2016   Procedure: TRANSESOPHAGEAL ECHOCARDIOGRAM (TEE);  Surgeon: Josue Hector, MD;  Location: [facility] ENDOSCOPY;  Service: Cardiovascular;  Laterality: N/A;  . TEE WITHOUT CARDIOVERSION N/A 12/02/2016   Procedure: TRANSESOPHAGEAL ECHOCARDIOGRAM (TEE);  Surgeon: [name], MD;  Location: [facility] ENDOSCOPY;  Service: Cardiovascular;  Laterality: N/A;    reports that he quit smoking about 8 years ago. His smoking use included cigarettes. He has a 1.50 pack-year smoking history. He has never used smokeless tobacco. He reports current  alcohol use. He reports that he does not use drugs. family history includes Heart disease in his sister; Other in his father; Sleep apnea in his sister; Stroke in his mother; Thyroid disease in his sister. Allergies  Allergen Reactions  . Ibuprofen Palpitations and Other (See Comments)    Rapid heart beat   Current Outpatient Medications on File Prior to Visit  Medication Sig Dispense Refill  . amLODipine (NORVASC) 2.5 MG tablet Take 1 tablet (2.5 mg total) by mouth daily. Annual appt is due must see provider for future refills 30 tablet 0  . Arginine 500 MG CAPS Take 500 mg by mouth daily.     . Ascorbic Acid (VITAMIN C) 1000 MG tablet Take 1,000 mg by mouth daily.     Marland Kitchen atorvastatin (LIPITOR) 20 MG tablet Take 1 tablet (20 mg total) by mouth daily. Annual appt is due must see provider for future refills 28 tablet 0  . cholecalciferol (VITAMIN D-400) 10 MCG (400 UNIT) TABS tablet Take 400 Units by mouth daily.     Marland Kitchen diltiazem (CARDIZEM CD) 120 MG 24 hr capsule Take 1 capsule (120 mg total) by mouth daily. TAKE 1 CAPSULE BY MOUTH EVERY DAY (Patient taking differently: Take 120 mg by mouth daily. ) 90 capsule 3  . diltiazem (CARDIZEM) 30 MG tablet Take 1 tablet every 4 hours AS NEEDED for heart rate over 100 45 tablet 2  . dofetilide (TIKOSYN) 250 MCG capsule Take 1 capsule (250 mcg total) by mouth 2 (two) times daily. 180 capsule 2  . doxazosin (CARDURA) 4 MG tablet Take 1 tablet (4 mg total) by mouth at bedtime. 90 tablet 3  . ELIQUIS 5 MG TABS tablet Take 1 tablet by mouth twice daily 60 tablet 6  . irbesartan (AVAPRO) 300 MG tablet Take 1 tablet by mouth once daily 30 tablet 0  . Lysine HCl 500 MG TABS Take 500 mg by mouth daily.     . Multiple Vitamins-Minerals (MULTIVITAL) tablet Take 1 tablet by mouth daily.    . nitroGLYCERIN (NITROSTAT) 0.4 MG SL tablet Place 1 tablet (0.4 mg total) under the tongue every 5 (five) minutes as needed for chest pain. 30 tablet 0   No current  facility-administered medications on file prior to visit.   Review of Systems All otherwise neg per pt    Objective:   Physical Exam BP 120/82 (BP Location: Left Arm, Patient Position: Sitting, Cuff Size: Large)   Pulse (!) 52   Temp 98.2 F (36.8 C) (Oral)   Ht 6' (1.829 m)   Wt 210 lb (95.3 kg)   SpO2 98%   BMI 28.48 kg/m  VS noted,  Constitutional: Pt appears in NAD HENT: Head: NCAT.  Right Ear: External ear normal.  Left Ear: External ear normal.  Eyes: . Pupils are equal, round, and reactive to light. Conjunctivae and EOM are normal Nose: without d/c or deformity Neck: Neck supple. Gross normal ROM Cardiovascular: Normal rate and regular rhythm.   Pulmonary/Chest: Effort normal and breath sounds without rales or wheezing.  Abd:  Soft, NT, ND, + BS, no organomegaly Neurological: Pt is alert. At baseline orientation, motor grossly intact Skin: Skin is warm. No rashes, other new lesions, no LE edema Psychiatric: Pt behavior is normal without  agitation  All otherwise neg per pt Lab Results  Component Value Date   WBC 9.2 10/19/2018   HGB 14.5 10/19/2018   HCT 42.9 10/19/2018   PLT 209 10/19/2018   GLUCOSE 109 (H) 07/14/2019   CHOL 173 09/25/2018   TRIG 158.0 (H) 09/25/2018   HDL 57.00 09/25/2018   LDLDIRECT 115.7 07/13/2013   LDLCALC 85 09/25/2018   ALT 29 09/25/2018   AST 18 09/25/2018   NA 138 07/14/2019   K 4.1 07/14/2019   CL 106 07/14/2019   CREATININE 1.05 07/14/2019   BUN 17 07/14/2019   CO2 23 07/14/2019   TSH 1.13 09/25/2018   PSA 0.88 09/25/2018   INR 1.8 (A) 09/11/2018   HGBA1C 6.1 09/25/2018      ECG today I have personally interpreted - sinus brady 44 - no ischemic changes    Assessment & Plan:

## 2019-09-22 NOTE — Assessment & Plan Note (Signed)

## 2019-09-23 LAB — CBC WITH DIFFERENTIAL/PLATELET
Basophils Absolute: 0.1 10*3/uL (ref 0.0–0.1)
Basophils Relative: 0.9 % (ref 0.0–3.0)
Eosinophils Absolute: 0.2 10*3/uL (ref 0.0–0.7)
Eosinophils Relative: 2.6 % (ref 0.0–5.0)
HCT: 41.4 % (ref 39.0–52.0)
Hemoglobin: 13.8 g/dL (ref 13.0–17.0)
Lymphocytes Relative: 29.5 % (ref 12.0–46.0)
Lymphs Abs: 2.3 10*3/uL (ref 0.7–4.0)
MCHC: 33.4 g/dL (ref 30.0–36.0)
MCV: 89.1 fl (ref 78.0–100.0)
Monocytes Absolute: 0.7 10*3/uL (ref 0.1–1.0)
Monocytes Relative: 9.2 % (ref 3.0–12.0)
Neutro Abs: 4.6 10*3/uL (ref 1.4–7.7)
Neutrophils Relative %: 57.8 % (ref 43.0–77.0)
Platelets: 248 10*3/uL (ref 150.0–400.0)
RBC: 4.65 Mil/uL (ref 4.22–5.81)
RDW: 13.3 % (ref 11.5–15.5)
WBC: 7.9 10*3/uL (ref 4.0–10.5)

## 2019-10-06 DIAGNOSIS — R351 Nocturia: Secondary | ICD-10-CM | POA: Diagnosis not present

## 2019-10-06 DIAGNOSIS — N5201 Erectile dysfunction due to arterial insufficiency: Secondary | ICD-10-CM | POA: Diagnosis not present

## 2019-10-06 DIAGNOSIS — N401 Enlarged prostate with lower urinary tract symptoms: Secondary | ICD-10-CM | POA: Diagnosis not present

## 2019-10-09 ENCOUNTER — Other Ambulatory Visit: Payer: Self-pay | Admitting: Internal Medicine

## 2019-10-09 NOTE — Telephone Encounter (Signed)
Please refill as per office routine med refill policy (all routine meds refilled for 3 mo or monthly per pt preference up to one year from last visit, then month to month grace period for 3 mo, then further med refills will have to be denied)  

## 2019-10-14 ENCOUNTER — Other Ambulatory Visit: Payer: Self-pay | Admitting: Internal Medicine

## 2019-10-14 NOTE — Telephone Encounter (Signed)
Please refill as per office routine med refill policy (all routine meds refilled for 3 mo or monthly per pt preference up to one year from last visit, then month to month grace period for 3 mo, then further med refills will have to be denied)  

## 2019-10-20 ENCOUNTER — Telehealth: Payer: Self-pay | Admitting: Internal Medicine

## 2019-10-20 ENCOUNTER — Other Ambulatory Visit: Payer: Self-pay | Admitting: Internal Medicine

## 2019-10-20 DIAGNOSIS — R079 Chest pain, unspecified: Secondary | ICD-10-CM

## 2019-10-20 NOTE — Telephone Encounter (Signed)
Pt last saw Roderic Palau, NP on 07/14/19, last labs 09/22/19 Creat 1.13, age 59, weight 95.3kg, based on specified criteria pt is on appropriate dosage of Eliquis 5mg  BID.  Will refill rx.

## 2019-10-20 NOTE — Telephone Encounter (Signed)
New message:   Pt is calling and states a stress test was supposed to be ordered for him but I do not see a order placed at this time. He also states he needs all of his medication sent in for 3 month refills. He also asked about a chest x-ray that is supposed to be ordered as well. Please advise.

## 2019-10-21 NOTE — Telephone Encounter (Signed)
Not sure how that happened  I will order cxr for Friday may 21 or after at [facility] site only  I will order stress testing

## 2019-10-22 ENCOUNTER — Other Ambulatory Visit: Payer: Self-pay | Admitting: Internal Medicine

## 2019-10-22 NOTE — Telephone Encounter (Signed)
Please refill as per office routine med refill policy (all routine meds refilled for 3 mo or monthly per pt preference up to one year from last visit, then month to month grace period for 3 mo, then further med refills will have to be denied)  

## 2019-10-23 ENCOUNTER — Other Ambulatory Visit: Payer: Self-pay | Admitting: Internal Medicine

## 2019-10-23 NOTE — Telephone Encounter (Signed)
Please refill as per office routine med refill policy (all routine meds refilled for 3 mo or monthly per pt preference up to one year from last visit, then month to month grace period for 3 mo, then further med refills will have to be denied)  

## 2019-10-25 ENCOUNTER — Other Ambulatory Visit: Payer: Self-pay

## 2019-11-04 ENCOUNTER — Telehealth (HOSPITAL_COMMUNITY): Payer: Self-pay

## 2019-11-04 NOTE — Telephone Encounter (Signed)
Detailed instructions left on the patient's answering machine. Asked to call back with any questions. S.Jancie Kercher EMTP 

## 2019-11-05 ENCOUNTER — Other Ambulatory Visit: Payer: Self-pay | Admitting: Internal Medicine

## 2019-11-05 ENCOUNTER — Other Ambulatory Visit (HOSPITAL_COMMUNITY)
Admission: RE | Admit: 2019-11-05 | Discharge: 2019-11-05 | Disposition: A | Discharge status: Home / Self Care | Payer: 59 | Source: Ambulatory Visit | Attending: Internal Medicine | Admitting: Internal Medicine

## 2019-11-05 DIAGNOSIS — Z01812 Encounter for preprocedural laboratory examination: Secondary | ICD-10-CM | POA: Insufficient documentation

## 2019-11-05 DIAGNOSIS — Z20822 Contact with and (suspected) exposure to covid-19: Secondary | ICD-10-CM | POA: Insufficient documentation

## 2019-11-06 LAB — SARS CORONAVIRUS 2 (TAT 6-24 HRS): SARS Coronavirus 2: NEGATIVE

## 2019-11-09 ENCOUNTER — Other Ambulatory Visit: Payer: Self-pay

## 2019-11-09 ENCOUNTER — Telehealth: Payer: Self-pay | Admitting: Internal Medicine

## 2019-11-09 ENCOUNTER — Ambulatory Visit (HOSPITAL_COMMUNITY): Payer: 59 | Attending: Cardiology

## 2019-11-09 DIAGNOSIS — R079 Chest pain, unspecified: Secondary | ICD-10-CM | POA: Diagnosis not present

## 2019-11-09 LAB — MYOCARDIAL PERFUSION IMAGING
Estimated workload: 10.4 METS
Exercise duration (min): 10 min
Exercise duration (sec): 0 s
LV dias vol: 89 mL (ref 62–150)
LV sys vol: 39 mL
MPHR: 160 {beats}/min
Peak HR: 169 {beats}/min
Percent HR: 105 %
Rest HR: 47 {beats}/min
SDS: 0
SRS: 0
SSS: 0
TID: 0.85

## 2019-11-09 MED ORDER — TECHNETIUM TC 99M TETROFOSMIN IV KIT
10.1000 | PACK | Freq: Once | INTRAVENOUS | Status: AC | PRN
  Administered 2019-11-09: 10.1 via INTRAVENOUS
  Filled 2019-11-09: qty 11

## 2019-11-09 MED ORDER — TECHNETIUM TC 99M TETROFOSMIN IV KIT
32.3000 | PACK | Freq: Once | INTRAVENOUS | Status: AC | PRN
  Administered 2019-11-09: 32.3 via INTRAVENOUS
  Filled 2019-11-09: qty 33

## 2019-11-09 NOTE — Telephone Encounter (Signed)
° ° °  Patient requesting letter for his employer. Patient was out of work from 6/4 thru today. Plans to return 6/9 Patient states he was out of work getting covid test and self quarantined up until stress test today. Patient states he can print letter from [employer] or send to personal email on file

## 2019-11-09 NOTE — Telephone Encounter (Signed)
[place]is for letter Verdis Frederickson to handle , thanks

## 2019-11-09 NOTE — Telephone Encounter (Signed)
Sent request to Dr. Jenny Reichmann.

## 2019-11-10 ENCOUNTER — Other Ambulatory Visit: Payer: Self-pay | Admitting: Internal Medicine

## 2019-11-10 NOTE — Telephone Encounter (Signed)
Please refill as per office routine med refill policy (all routine meds refilled for 3 mo or monthly per pt preference up to one year from last visit, then month to month grace period for 3 mo, then further med refills will have to be denied)  

## 2019-11-11 NOTE — Telephone Encounter (Signed)
Letter has been sent

## 2019-11-19 ENCOUNTER — Other Ambulatory Visit: Payer: Self-pay | Admitting: Internal Medicine

## 2019-11-19 NOTE — Telephone Encounter (Signed)
Please refill as per office routine med refill policy (all routine meds refilled for 3 mo or monthly per pt preference up to one year from last visit, then month to month grace period for 3 mo, then further med refills will have to be denied)  

## 2019-11-22 ENCOUNTER — Other Ambulatory Visit (HOSPITAL_COMMUNITY): Payer: Self-pay | Admitting: Nurse Practitioner

## 2019-12-12 ENCOUNTER — Other Ambulatory Visit: Payer: Self-pay | Admitting: Internal Medicine

## 2019-12-12 NOTE — Telephone Encounter (Signed)
Please refill as per office routine med refill policy (all routine meds refilled for 3 mo or monthly per pt preference up to one year from last visit, then month to month grace period for 3 mo, then further med refills will have to be denied)  

## 2019-12-14 ENCOUNTER — Other Ambulatory Visit: Payer: Self-pay

## 2019-12-14 ENCOUNTER — Encounter: Payer: Self-pay | Admitting: Internal Medicine

## 2019-12-14 ENCOUNTER — Ambulatory Visit (INDEPENDENT_AMBULATORY_CARE_PROVIDER_SITE_OTHER): Payer: 59

## 2019-12-14 ENCOUNTER — Ambulatory Visit (INDEPENDENT_AMBULATORY_CARE_PROVIDER_SITE_OTHER): Payer: 59 | Admitting: Internal Medicine

## 2019-12-14 DIAGNOSIS — R05 Cough: Secondary | ICD-10-CM | POA: Diagnosis not present

## 2019-12-14 DIAGNOSIS — R0981 Nasal congestion: Secondary | ICD-10-CM | POA: Diagnosis not present

## 2019-12-14 DIAGNOSIS — R079 Chest pain, unspecified: Secondary | ICD-10-CM | POA: Diagnosis not present

## 2019-12-14 DIAGNOSIS — R739 Hyperglycemia, unspecified: Secondary | ICD-10-CM

## 2019-12-14 DIAGNOSIS — R059 Cough, unspecified: Secondary | ICD-10-CM

## 2019-12-14 MED ORDER — FEXOFENADINE HCL 180 MG PO TABS: 180.0000 mg | ORAL_TABLET | Freq: Every day | ORAL | 2 refills | Status: DC

## 2019-12-14 MED ORDER — TRIAMCINOLONE ACETONIDE 55 MCG/ACT NA AERO
2.0000 | INHALATION_SPRAY | Freq: Every day | NASAL | 12 refills | Status: DC
Start: 2019-12-14 — End: 2021-03-19

## 2019-12-14 NOTE — Patient Instructions (Signed)
Please take all new medication as prescribed - the allegra and nasacort for allergies  Please continue all other medications as before, and refills have been done if requested.  Please have the pharmacy call with any other refills you may need.  Please continue your efforts at being more active, low cholesterol diet, and weight control.  Please keep your appointments with your specialists as you may have planned  Please go to the XRAY Department in the first floor for the x-ray testing  You will be contacted by phone if any changes need to be made immediately.  Otherwise, you will receive a letter about your results with an explanation, but please check with MyChart first.  Please remember to sign up for MyChart if you have not done so, as this will be important to you in the future with finding out test results, communicating by private email, and scheduling acute appointments online when needed.

## 2019-12-14 NOTE — Progress Notes (Signed)
Subjective:    Patient ID: [name] [name], male    DOB: 12/14/1960, 59 y.o.   MRN: [id number]   HPI:    Here with 2 months ongoinet onset fever, facial pain, pressure, headache, general weakness and malaise, and greenish d/c, with mild ST and cough, but pt denies chest pain, wheezing, increased sob or doe, orthopnea, PND, increased LE swelling, palpitations, dizziness or syncope.  Also with prod cough brownish mucous phlegm.  Also this hard to use the CPAP at hight.  Also with right chest pain sharp plerutric and back acrose the back below the shoudler blades.  No fever, but the cough really bothers him.  Pt denies fever, wt loss, night sweats, loss of appetite, or other constitutional symptoms No BRB seen. Does take eliquis.  Echo 2018 with normal EF.  Has cardiology appt soon.  May 2020 cxr  - NAD.  July 2020 CT CORONARY MORPH W/CTA COR W/SCORE W/CA W/CM &/OR WO/CM - with no obstructive CAD.   Last labs apr 2021 with normal cbc.  Pt denies polydipsia, polyuria, Past Medical History:  Diagnosis Date  . Arthritis    "maybe in my hands" (10/31/2016)  . Atrial flutter ([place])   . CHEST PAIN-PRECORDIAL 05/25/2009  . ERECTILE DYSFUNCTION, ORGANIC 01/16/2010  . GERD (gastroesophageal reflux disease)   . HYPERLIPIDEMIA 12/15/2007  . HYPERTENSION 12/15/2007  . HYPOKALEMIA 12/15/2007  . Internal hemorrhoids   . OSA (obstructive sleep apnea) 09/22/2019  . Persistent atrial fibrillation ([place])    a. prior ablation/flecainide; placed on Tikosyn 07/2017.  Marland Kitchen POSTURAL LIGHTHEADEDNESS 07/06/2008  . Sinus bradycardia   . Visit for monitoring Tikosyn therapy 07/03/2017   Past Surgical History:  Procedure Laterality Date  . ATRIAL FIBRILLATION ABLATION N/A 08/13/2011   Procedure: ATRIAL FIBRILLATION ABLATION;  Surgeon: Thompson Grayer, MD;  Location: [facility] CATH LAB;  Service: Cardiovascular;  Laterality: N/A;  . ATRIAL FIBRILLATION ABLATION  10/31/2016  . ATRIAL FIBRILLATION ABLATION N/A 10/31/2016   Procedure: Atrial  Fibrillation Ablation;  Surgeon: Thompson Grayer, MD;  Location: [place] CV LAB;  Service: Cardiovascular;  Laterality: N/A;  . ATRIAL FLUTTER ABLATION N/A 12/27/2011   CTI repeat ablation by Dr Rayann Heman  . CARDIOVERSION  11/21/2011   Procedure: CARDIOVERSION;  Surgeon: Larey Dresser, MD;  Location: [place];  Service: Cardiovascular;  Laterality: N/A;  . CARDIOVERSION N/A 12/02/2016   Procedure: CARDIOVERSION;  Surgeon: [name], MD;  Location: [place];  Service: Cardiovascular;  Laterality: N/A;  . CHALAZION EXCISION Right   . ELBOW SURGERY Right early 1980s   bone spur/chip; "opened me up"  . INGUINAL HERNIA REPAIR Right 11/2006  . TEE WITHOUT CARDIOVERSION  08/12/2011   Procedure: TRANSESOPHAGEAL ECHOCARDIOGRAM (TEE);  Surgeon: Fay Records, MD;  Location[facility] ENDOSCOPY;  Service: Cardiovascular;  Laterality: N/A;  . TEE WITHOUT CARDIOVERSION N/A 10/30/2016   Procedure: TRANSESOPHAGEAL ECHOCARDIOGRAM (TEE);  Surgeon: Josue Hector, MD;  Location: [facility] ENDOSCOPY;  Service: Cardiovascular;  Laterality: N/A;  . TEE WITHOUT CARDIOVERSION N/A 12/02/2016   Procedure: TRANSESOPHAGEAL ECHOCARDIOGRAM (TEE);  Surgeon: [name], MD;  Location: [facility] ENDOSCOPY;  Service: Cardiovascular;  Laterality: N/A;    reports that he quit smoking about 8 years ago. His smoking use included cigarettes. He has a 1.50 pack-year smoking history. He has never used smokeless tobacco. He reports current alcohol use. He reports that he does not use drugs. family history includes Heart disease in his sister; Other in his father; Sleep apnea in his sister; Stroke  in his mother; Thyroid disease in his sister. Allergies  Allergen Reactions  . Ibuprofen Palpitations and Other (See Comments)    Rapid heart beat   Current Outpatient Medications on File Prior to Visit  Medication Sig Dispense Refill  . amLODipine (NORVASC) 2.5 MG tablet Take 1 tablet (2.5 mg total) by mouth daily. 90 tablet 3  . Arginine 500 MG  CAPS Take 500 mg by mouth daily.     . Ascorbic Acid (VITAMIN C) 1000 MG tablet Take 1,000 mg by mouth daily.     Marland Kitchen atorvastatin (LIPITOR) 20 MG tablet Take 1 tablet (20 mg total) by mouth daily. 90 tablet 3  . cholecalciferol (VITAMIN D-400) 10 MCG (400 UNIT) TABS tablet Take 400 Units by mouth daily.     Marland Kitchen diltiazem (CARDIZEM CD) 120 MG 24 hr capsule Take 1 capsule by mouth once daily 30 capsule 0  . diltiazem (CARDIZEM) 30 MG tablet Take 1 tablet every 4 hours AS NEEDED for heart rate over 100 45 tablet 2  . dofetilide (TIKOSYN) 250 MCG capsule TAKE ONE CAPSULE BY MOUTH TWICE DAILY 180 capsule 3  . doxazosin (CARDURA) 4 MG tablet TAKE 1 TABLET BY MOUTH AT BEDTIME 30 tablet 0  . ELIQUIS 5 MG TABS tablet Take 1 tablet by mouth twice daily 60 tablet 6  . irbesartan (AVAPRO) 300 MG tablet Take 1 tablet by mouth once daily 30 tablet 0  . Lysine HCl 500 MG TABS Take 500 mg by mouth daily.     . Multiple Vitamins-Minerals (MULTIVITAL) tablet Take 1 tablet by mouth daily.    . nitroGLYCERIN (NITROSTAT) 0.4 MG SL tablet Place 1 tablet (0.4 mg total) under the tongue every 5 (five) minutes as needed for chest pain. 30 tablet 0   No current facility-administered medications on file prior to visit.   Review of Systems All otherwise neg per pt     Objective:   Physical Exam BP 126/80 (BP Location: Left Arm, Patient Position: Sitting, Cuff Size: Large)   Pulse (!) 49   Temp 98.2 F (36.8 C) (Oral)   Ht 6' (1.829 m)   Wt 210 lb (95.3 kg)   SpO2 97%   BMI 28.48 kg/m  VS noted,  Constitutional: Pt appears in NAD HENT: Head: NCAT.  Right Ear: External ear normal.  Left Ear: External ear normal.  Eyes: . Pupils are equal, round, and reactive to light. Conjunctivae and EOM are normal Nose: without d/c or deformity Neck: Neck supple. Gross normal ROM Cardiovascular: Normal rate and regular rhythm.   Pulmonary/Chest: Effort normal and breath sounds without rales or wheezing.  Abd:  Soft, NT,  ND, + BS, no organomegaly Neurological: Pt is alert. At baseline orientation, motor grossly intact Skin: Skin is warm. No rashes, other new lesions, no LE edema Psychiatric: Pt behavior is normal without agitation , 1+ nervous All otherwise neg per pt Lab Results  Component Value Date   WBC 7.9 09/22/2019   HGB 13.8 09/22/2019   HCT 41.4 09/22/2019   PLT 248.0 09/22/2019   GLUCOSE 98 09/22/2019   CHOL 147 09/22/2019   TRIG 157.0 (H) 09/22/2019   HDL 53.90 09/22/2019   LDLDIRECT 115.7 07/13/2013   LDLCALC 62 09/22/2019   ALT 27 09/22/2019   AST 20 09/22/2019   NA 139 09/22/2019   K 3.9 09/22/2019   CL 105 09/22/2019   CREATININE 1.13 09/22/2019   BUN 18 09/22/2019   CO2 27 09/22/2019   TSH 1.40 09/22/2019  PSA 0.70 09/22/2019   INR 1.8 (A) 09/11/2018   HGBA1C 5.9 09/22/2019         Assessment & Plan:

## 2019-12-14 NOTE — Assessment & Plan Note (Addendum)
Most c/w new onset allergic rhinitis - for empiric trial allegra and nasacort asd,  to f/u any worsening symptoms or concerns  I spent 31 minutes in preparing to see the patient by review of recent labs, imaging and procedures, obtaining and reviewing separately obtained history, communicating with the patient and family or caregiver, ordering medications, tests or procedures, and documenting clinical information in the EHR including the differential Dx, treatment, and any further evaluation and other management of sinus congestoin, cough, hyperglycemia

## 2019-12-14 NOTE — Assessment & Plan Note (Signed)
Exam benign, no fever, ok for cxr

## 2019-12-14 NOTE — Assessment & Plan Note (Addendum)
stable overall by history and exam, recent data reviewed with pt, and pt to continue medical treatment as before,  to f/u any worsening symptoms or concerns  

## 2019-12-15 ENCOUNTER — Ambulatory Visit: Payer: 59 | Admitting: Internal Medicine

## 2019-12-15 ENCOUNTER — Other Ambulatory Visit: Payer: Self-pay | Admitting: Internal Medicine

## 2019-12-16 ENCOUNTER — Encounter: Payer: Self-pay | Admitting: Internal Medicine

## 2019-12-20 IMAGING — DX PORTABLE CHEST - 1 VIEW
1 series · 1 of 1 positions shown · non-contrast
Comparison: 11/15/2004

CLINICAL DATA: Chest pain

EXAM:
PORTABLE CHEST 1 VIEW

[chest]
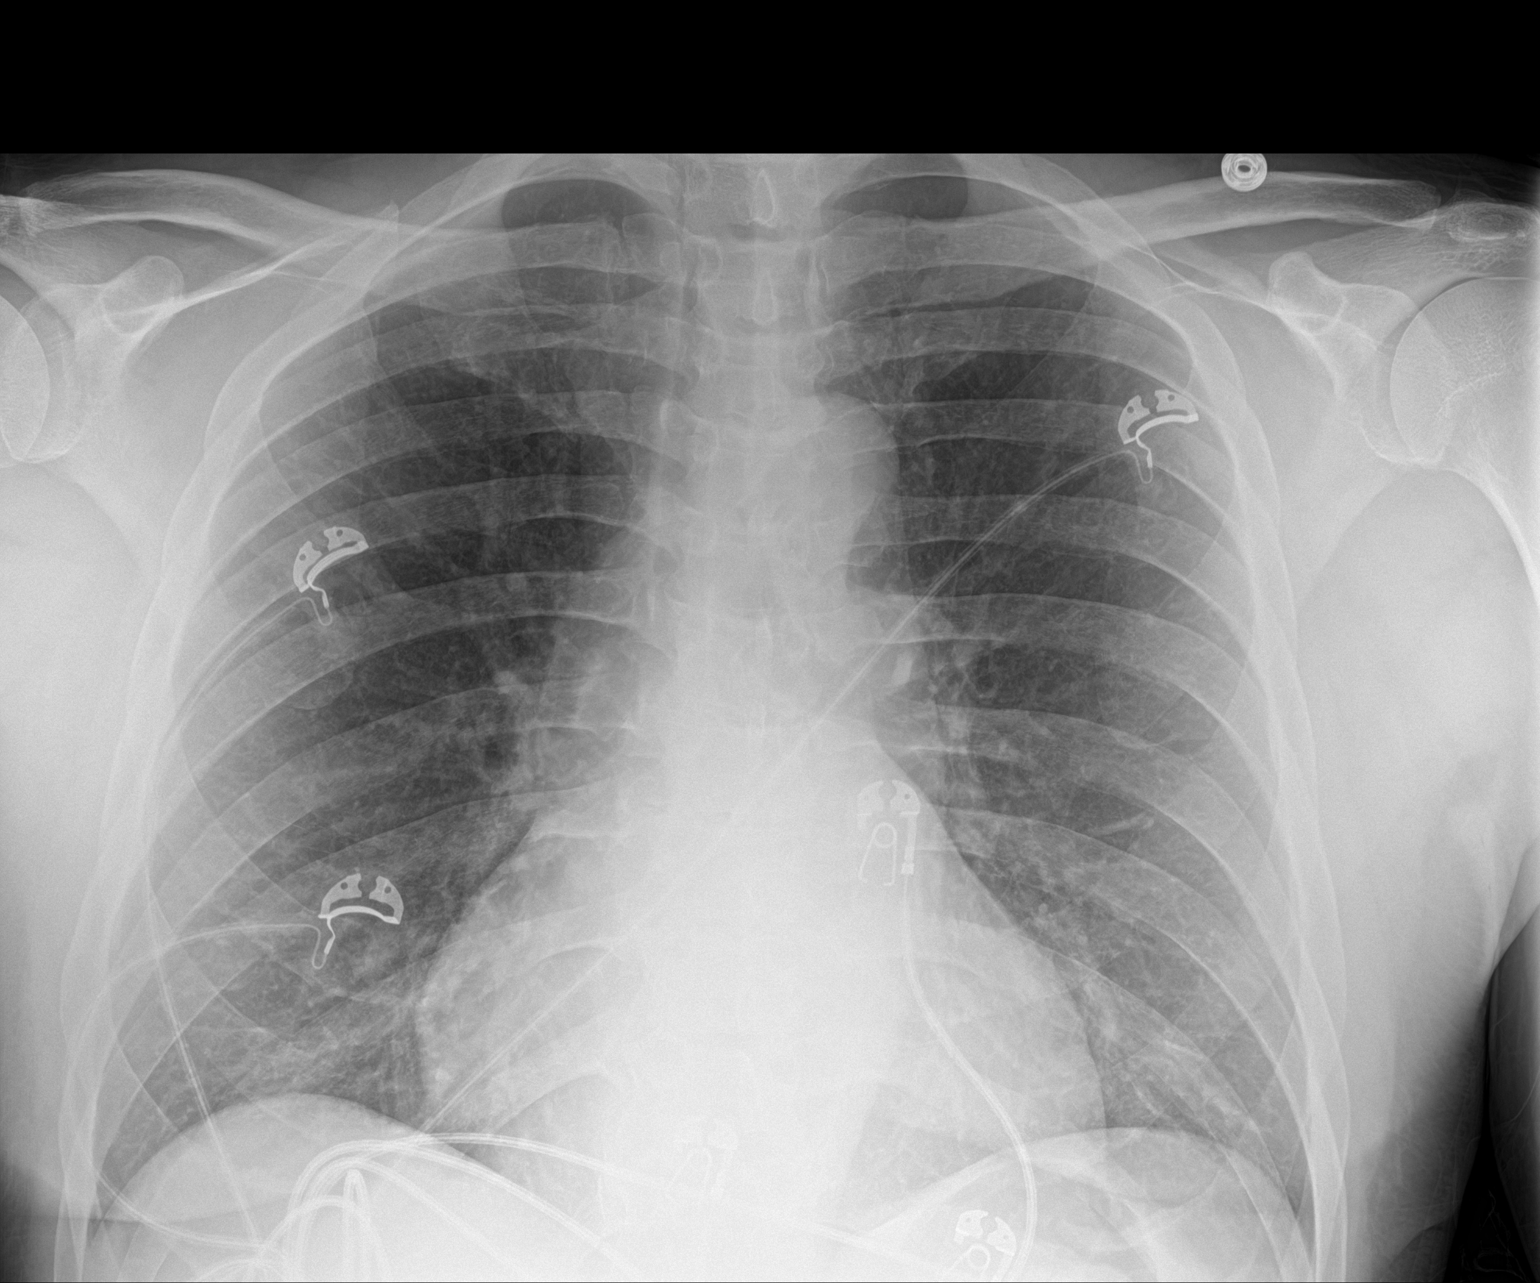

[1 of 1 positions shown; findings below may reference images not displayed]

FINDINGS: Normal heart size and mediastinal contours. No acute infiltrate or
edema. No effusion or pneumothorax. No acute osseous findings.
IMPRESSION: Negative chest.

## 2019-12-21 ENCOUNTER — Other Ambulatory Visit: Payer: Self-pay | Admitting: Internal Medicine

## 2019-12-21 NOTE — Telephone Encounter (Signed)
Please refill as per office routine med refill policy (all routine meds refilled for 3 mo or monthly per pt preference up to one year from last visit, then month to month grace period for 3 mo, then further med refills will have to be denied)  

## 2019-12-23 ENCOUNTER — Other Ambulatory Visit: Payer: Self-pay | Admitting: Internal Medicine

## 2019-12-23 NOTE — Telephone Encounter (Signed)
Please refill as per office routine med refill policy (all routine meds refilled for 3 mo or monthly per pt preference up to one year from last visit, then month to month grace period for 3 mo, then further med refills will have to be denied)  

## 2020-01-11 ENCOUNTER — Other Ambulatory Visit: Payer: Self-pay | Admitting: Internal Medicine

## 2020-01-12 NOTE — Telephone Encounter (Signed)
Please refill as per office routine med refill policy (all routine meds refilled for 3 mo or monthly per pt preference up to one year from last visit, then month to month grace period for 3 mo, then further med refills will have to be denied)  

## 2020-01-13 ENCOUNTER — Other Ambulatory Visit: Payer: Self-pay

## 2020-01-13 ENCOUNTER — Ambulatory Visit (HOSPITAL_COMMUNITY)
Admission: RE | Admit: 2020-01-13 | Discharge: 2020-01-13 | Disposition: A | Discharge status: Home / Self Care | Payer: 59 | Source: Ambulatory Visit | Attending: Nurse Practitioner | Admitting: Nurse Practitioner

## 2020-01-13 ENCOUNTER — Encounter (HOSPITAL_COMMUNITY): Payer: Self-pay | Admitting: Nurse Practitioner

## 2020-01-13 VITALS — BP 108/80 | HR 99 | Ht 72.0 in | Wt 206.0 lb

## 2020-01-13 DIAGNOSIS — Z87891 Personal history of nicotine dependence: Secondary | ICD-10-CM | POA: Insufficient documentation

## 2020-01-13 DIAGNOSIS — Z9889 Other specified postprocedural states: Secondary | ICD-10-CM | POA: Diagnosis not present

## 2020-01-13 DIAGNOSIS — I480 Paroxysmal atrial fibrillation: Secondary | ICD-10-CM | POA: Diagnosis not present

## 2020-01-13 DIAGNOSIS — E785 Hyperlipidemia, unspecified: Secondary | ICD-10-CM | POA: Insufficient documentation

## 2020-01-13 DIAGNOSIS — Z8719 Personal history of other diseases of the digestive system: Secondary | ICD-10-CM | POA: Insufficient documentation

## 2020-01-13 DIAGNOSIS — Z79899 Other long term (current) drug therapy: Secondary | ICD-10-CM | POA: Diagnosis not present

## 2020-01-13 DIAGNOSIS — Z886 Allergy status to analgesic agent status: Secondary | ICD-10-CM | POA: Insufficient documentation

## 2020-01-13 DIAGNOSIS — I4819 Other persistent atrial fibrillation: Secondary | ICD-10-CM | POA: Insufficient documentation

## 2020-01-13 DIAGNOSIS — G4733 Obstructive sleep apnea (adult) (pediatric): Secondary | ICD-10-CM | POA: Diagnosis not present

## 2020-01-13 DIAGNOSIS — D6869 Other thrombophilia: Secondary | ICD-10-CM

## 2020-01-13 DIAGNOSIS — K219 Gastro-esophageal reflux disease without esophagitis: Secondary | ICD-10-CM | POA: Diagnosis not present

## 2020-01-13 DIAGNOSIS — Z7901 Long term (current) use of anticoagulants: Secondary | ICD-10-CM | POA: Insufficient documentation

## 2020-01-13 DIAGNOSIS — N529 Male erectile dysfunction, unspecified: Secondary | ICD-10-CM | POA: Diagnosis not present

## 2020-01-13 DIAGNOSIS — Z8249 Family history of ischemic heart disease and other diseases of the circulatory system: Secondary | ICD-10-CM | POA: Diagnosis not present

## 2020-01-13 DIAGNOSIS — I10 Essential (primary) hypertension: Secondary | ICD-10-CM | POA: Diagnosis not present

## 2020-01-13 DIAGNOSIS — M1990 Unspecified osteoarthritis, unspecified site: Secondary | ICD-10-CM | POA: Insufficient documentation

## 2020-01-13 LAB — MAGNESIUM: Magnesium: 2.2 mg/dL (ref 1.7–2.4)

## 2020-01-13 LAB — BASIC METABOLIC PANEL
Anion gap: 10 (ref 5–15)
BUN: 22 mg/dL — ABNORMAL HIGH (ref 6–20)
CO2: 20 mmol/L — ABNORMAL LOW (ref 22–32)
Calcium: 9.1 mg/dL (ref 8.9–10.3)
Chloride: 107 mmol/L (ref 98–111)
Creatinine, Ser: 1.31 mg/dL — ABNORMAL HIGH (ref 0.61–1.24)
GFR calc Af Amer: >60 mL/min (ref >60)
GFR calc non Af Amer: 59 mL/min — ABNORMAL LOW (ref >60)
Glucose, Bld: 130 mg/dL — ABNORMAL HIGH (ref 70–99)
Potassium: 4.7 mmol/L (ref 3.5–5.1)
Sodium: 137 mmol/L (ref 135–145)

## 2020-01-13 MED ORDER — NITROGLYCERIN 0.4 MG SL SUBL
0.4000 mg | SUBLINGUAL_TABLET | SUBLINGUAL | 0 refills | Status: DC | PRN
Start: 2020-01-13 — End: 2020-12-18

## 2020-01-13 NOTE — Progress Notes (Signed)
Primary Care Physician: Biagio Borg, MD Referring Physician: Luana Shu is a 59 y.o. male with a h/o afib in the afib clinic for Tikosyn surveillance. He reports that he has not noted any sustained afib. He will have an infrequent breakthrough episode, rarely lasts more than one day.  He is being compliant with  Tikosyn. and  eliquis with a CHA2DS2VASc score of 1. No bleeding issues.  He presented in afib this am, but by auscultation was back in SR by end of the appointment. He has had his covid shots this  last spring.   Today, he denies symptoms of palpitations, chest pain, shortness of breath, orthopnea, PND, lower extremity edema, dizziness, presyncope, syncope, or neurologic sequela. The patient is tolerating medications without difficulties and is otherwise without complaint today.   Past Medical History:  Diagnosis Date   Arthritis    "maybe in my hands" (10/31/2016)   Atrial flutter ([place])    CHEST PAIN-PRECORDIAL 05/25/2009   ERECTILE DYSFUNCTION, ORGANIC 01/16/2010   GERD (gastroesophageal reflux disease)    HYPERLIPIDEMIA 12/15/2007   HYPERTENSION 12/15/2007   HYPOKALEMIA 12/15/2007   Internal hemorrhoids    OSA (obstructive sleep apnea) 09/22/2019   Persistent atrial fibrillation ([place])    a. prior ablation/flecainide; placed on Tikosyn 07/2017.   POSTURAL LIGHTHEADEDNESS 07/06/2008   Sinus bradycardia    Visit for monitoring Tikosyn therapy 07/03/2017   Past Surgical History:  Procedure Laterality Date   ATRIAL FIBRILLATION ABLATION N/A 08/13/2011   Procedure: ATRIAL FIBRILLATION ABLATION;  Surgeon: Thompson Grayer, MD;  Location: [facility] CATH LAB;  Service: Cardiovascular;  Laterality: N/A;   ATRIAL FIBRILLATION ABLATION  10/31/2016   ATRIAL FIBRILLATION ABLATION N/A 10/31/2016   Procedure: Atrial Fibrillation Ablation;  Surgeon: Thompson Grayer, MD;  Location: [facility] CV LAB;  Service: Cardiovascular;  Laterality: N/A;   ATRIAL FLUTTER  ABLATION N/A 12/27/2011   CTI repeat ablation by Dr Rayann Heman   CARDIOVERSION  11/21/2011   Procedure: CARDIOVERSION;  Surgeon: Larey Dresser, MD;  Location: [facility];  Service: Cardiovascular;  Laterality: N/A;   CARDIOVERSION N/A 12/02/2016   Procedure: CARDIOVERSION;  Surgeon: [name], MD;  Location: [facility] ENDOSCOPY;  Service: Cardiovascular;  Laterality: N/A;   CHALAZION EXCISION Right    ELBOW SURGERY Right early 1980s   bone spur/chip; "opened me up"   INGUINAL HERNIA REPAIR Right 11/2006   TEE WITHOUT CARDIOVERSION  08/12/2011   Procedure: TRANSESOPHAGEAL ECHOCARDIOGRAM (TEE);  Surgeon: Fay Records, MD;  Location: [facility] ENDOSCOPY;  Service: Cardiovascular;  Laterality: N/A;   TEE WITHOUT CARDIOVERSION N/A 10/30/2016   Procedure: TRANSESOPHAGEAL ECHOCARDIOGRAM (TEE);  Surgeon: Josue Hector, MD;  Location: [facility] ENDOSCOPY;  Service: Cardiovascular;  Laterality: N/A;   TEE WITHOUT CARDIOVERSION N/A 12/02/2016   Procedure: TRANSESOPHAGEAL ECHOCARDIOGRAM (TEE);  Surgeon: [name], MD;  Location: [facility] ENDOSCOPY;  Service: Cardiovascular;  Laterality: N/A;    Current Outpatient Medications  Medication Sig Dispense Refill   amLODipine (NORVASC) 2.5 MG tablet Take 1 tablet (2.5 mg total) by mouth daily. 90 tablet 3   Arginine 500 MG CAPS Take 500 mg by mouth daily.      Ascorbic Acid (VITAMIN C) 1000 MG tablet Take 1,000 mg by mouth daily.      atorvastatin (LIPITOR) 20 MG tablet Take 1 tablet (20 mg total) by mouth daily. 90 tablet 3   cholecalciferol (VITAMIN D-400) 10 MCG (400 UNIT) TABS tablet Take 400 Units by mouth daily.  diltiazem (CARDIZEM CD) 120 MG 24 hr capsule Take 1 capsule (120 mg total) by mouth daily. 90 capsule 3   diltiazem (CARDIZEM) 30 MG tablet Take 1 tablet every 4 hours AS NEEDED for heart rate over 100 45 tablet 2   dofetilide (TIKOSYN) 250 MCG capsule TAKE ONE CAPSULE BY MOUTH TWICE DAILY 180 capsule 3   doxazosin (CARDURA) 4 MG tablet TAKE 1  TABLET BY MOUTH AT BEDTIME 30 tablet 0   ELIQUIS 5 MG TABS tablet Take 1 tablet by mouth twice daily 60 tablet 6   fexofenadine (ALLEGRA) 180 MG tablet Take 1 tablet (180 mg total) by mouth daily. 30 tablet 2   irbesartan (AVAPRO) 300 MG tablet Take 1 tablet by mouth once daily 30 tablet 0   Lysine HCl 500 MG TABS Take 500 mg by mouth daily.      Multiple Vitamins-Minerals (MULTIVITAL) tablet Take 1 tablet by mouth daily.     nitroGLYCERIN (NITROSTAT) 0.4 MG SL tablet Place 1 tablet (0.4 mg total) under the tongue every 5 (five) minutes as needed for chest pain. 30 tablet 0   triamcinolone (NASACORT) 55 MCG/ACT AERO nasal inhaler Place 2 sprays into the nose daily. 1 Inhaler 12   No current facility-administered medications for this encounter.    Allergies  Allergen Reactions   Ibuprofen Palpitations and Other (See Comments)    Rapid heart beat    Social History   Socioeconomic History   Marital status: Married    Spouse name: Not on file   Number of children: 2   Years of education: Not on file   Highest education level: Not on file  Occupational History   Occupation: SECURITY    Employer: [employer]  Tobacco Use   Smoking status: Former Smoker    Packs/day: 0.10    Years: 15.00    Pack years: 1.50    Types: Cigarettes    Quit date: 12/17/2010    Years since quitting: 9.0   Smokeless tobacco: Never Used  Vaping Use   Vaping Use: Never used  Substance and Sexual Activity   Alcohol use: Yes    Comment: "1 glass of wine/month maybe" (10/31/2016)   Drug use: No   Sexual activity: Not Currently  Other Topics Concern   Not on file  Social History Narrative   Pt lives in [place] with spouse. 2 grown children.   Works at [employer] in [place].   Social Determinants of Health   Financial Resource Strain:    Difficulty of Paying Living Expenses:   Food Insecurity:    Worried About Charity fundraiser in the Last Year:    Arts development officer in the Last Year:   Transportation Needs:    Film/video editor (Medical):    Lack of Transportation (Non-Medical):   Physical Activity:    Days of Exercise per Week:    Minutes of Exercise per Session:   Stress:    Feeling of Stress :   Social Connections:    Frequency of Communication with Friends and Family:    Frequency of Social Gatherings with Friends and Family:    Attends Religious Services:    Active Member of Clubs or Organizations:    Attends Music therapist:    Marital Status:   Intimate Partner Violence:    Fear of Current or Ex-Partner:    Emotionally Abused:    Physically Abused:    Sexually Abused:     Family History  Problem Relation Age of Onset   Stroke Mother    Heart disease Sister    Thyroid disease Sister    Sleep apnea Sister    Other Father        deceased stomach hemorrhage   Anesthesia problems Neg Hx     ROS- All systems are reviewed and negative except as per the HPI above  Physical Exam: Vitals:   01/13/20 0842  BP: 108/80  Pulse: 99  SpO2: 98%  Weight: 93.4 kg  Height: 6' (1.829 m)   Wt Readings from Last 3 Encounters:  01/13/20 93.4 kg  12/14/19 95.3 kg  11/09/19 95.3 kg    Labs: Lab Results  Component Value Date   NA 139 09/22/2019   K 3.9 09/22/2019   CL 105 09/22/2019   CO2 27 09/22/2019   GLUCOSE 98 09/22/2019   BUN 18 09/22/2019   CREATININE 1.13 09/22/2019   CALCIUM 9.6 09/22/2019   MG 1.9 07/14/2019   Lab Results  Component Value Date   INR 1.8 (A) 09/11/2018   Lab Results  Component Value Date   CHOL 147 09/22/2019   HDL 53.90 09/22/2019   LDLCALC 62 09/22/2019   TRIG 157.0 (H) 09/22/2019     GEN- The patient is well appearing, alert and oriented x 3 today.   Head- normocephalic, atraumatic Eyes-  Sclera clear, conjunctiva pink Ears- hearing intact Oropharynx- clear Neck- supple, no JVP Lymph- no cervical lymphadenopathy Lungs- Clear to  ausculation bilaterally, normal work of breathing Heart- irregular on presentation by ekg, but with regular rhythm on auscultation,  no murmurs, rubs or gallops, PMI not laterally displaced GI- soft, NT, ND, + BS Extremities- no clubbing, cyanosis, or edema MS- no significant deformity or atrophy Skin- no rash or lesion Psych- euthymic mood, full affect Neuro- strength and sensation are intact  EKG- afib at 99 bpm, qrs int 84 ms, qtc 464 ms ( later SR on auscultation)    Assessment and Plan: 1. Afib  Staying in SR with Tikosyn, qtc stable Continue dofetilide 250 mcg  bid and diltiazem 120 mg qd Bmet/mag today   2. CHA2DS2VASc score of 1 Continue eliquis 5 mg bid   3. HTN Stable   F/u in 6 months   Geroge Baseman. Vanessia Bokhari, [facility] [address] [place], [place] [address] [phone]

## 2020-01-24 ENCOUNTER — Other Ambulatory Visit: Payer: Self-pay | Admitting: Internal Medicine

## 2020-01-24 NOTE — Telephone Encounter (Signed)
Please refill as per office routine med refill policy (all routine meds refilled for 3 mo or monthly per pt preference up to one year from last visit, then month to month grace period for 3 mo, then further med refills will have to be denied)  

## 2020-02-02 DIAGNOSIS — N401 Enlarged prostate with lower urinary tract symptoms: Secondary | ICD-10-CM | POA: Diagnosis not present

## 2020-02-02 DIAGNOSIS — R351 Nocturia: Secondary | ICD-10-CM | POA: Diagnosis not present

## 2020-02-02 DIAGNOSIS — N5201 Erectile dysfunction due to arterial insufficiency: Secondary | ICD-10-CM | POA: Diagnosis not present

## 2020-02-11 ENCOUNTER — Other Ambulatory Visit (HOSPITAL_COMMUNITY): Payer: Self-pay | Admitting: *Deleted

## 2020-02-11 ENCOUNTER — Other Ambulatory Visit: Payer: Self-pay | Admitting: Internal Medicine

## 2020-02-11 MED ORDER — DOFETILIDE 250 MCG PO CAPS: 250.0000 ug | ORAL_CAPSULE | Freq: Two times a day (BID) | ORAL | 2 refills | Status: DC

## 2020-02-20 ENCOUNTER — Other Ambulatory Visit: Payer: Self-pay | Admitting: Internal Medicine

## 2020-02-20 NOTE — Telephone Encounter (Signed)
Please refill as per office routine med refill policy (all routine meds refilled for 3 mo or monthly per pt preference up to one year from last visit, then month to month grace period for 3 mo, then further med refills will have to be denied)  

## 2020-03-16 ENCOUNTER — Other Ambulatory Visit: Payer: Self-pay | Admitting: Internal Medicine

## 2020-03-16 NOTE — Telephone Encounter (Signed)
Please refill as per office routine med refill policy (all routine meds refilled for 3 mo or monthly per pt preference up to one year from last visit, then month to month grace period for 3 mo, then further med refills will have to be denied)  

## 2020-03-20 ENCOUNTER — Other Ambulatory Visit: Payer: Self-pay | Admitting: Internal Medicine

## 2020-03-20 NOTE — Telephone Encounter (Signed)
Please refill as per office routine med refill policy (all routine meds refilled for 3 mo or monthly per pt preference up to one year from last visit, then month to month grace period for 3 mo, then further med refills will have to be denied)  

## 2020-03-23 ENCOUNTER — Encounter: Payer: Self-pay | Admitting: Internal Medicine

## 2020-03-23 ENCOUNTER — Other Ambulatory Visit: Payer: Self-pay

## 2020-03-23 ENCOUNTER — Ambulatory Visit (INDEPENDENT_AMBULATORY_CARE_PROVIDER_SITE_OTHER): Payer: 59 | Admitting: Internal Medicine

## 2020-03-23 VITALS — BP 110/80 | HR 48 | Temp 97.9°F | Ht 72.0 in | Wt 204.0 lb

## 2020-03-23 DIAGNOSIS — E785 Hyperlipidemia, unspecified: Secondary | ICD-10-CM | POA: Diagnosis not present

## 2020-03-23 DIAGNOSIS — Z0000 Encounter for general adult medical examination without abnormal findings: Secondary | ICD-10-CM

## 2020-03-23 DIAGNOSIS — Z23 Encounter for immunization: Secondary | ICD-10-CM | POA: Diagnosis not present

## 2020-03-23 DIAGNOSIS — R001 Bradycardia, unspecified: Secondary | ICD-10-CM | POA: Diagnosis not present

## 2020-03-23 DIAGNOSIS — I10 Essential (primary) hypertension: Secondary | ICD-10-CM | POA: Diagnosis not present

## 2020-03-23 DIAGNOSIS — R739 Hyperglycemia, unspecified: Secondary | ICD-10-CM | POA: Diagnosis not present

## 2020-03-23 DIAGNOSIS — E559 Vitamin D deficiency, unspecified: Secondary | ICD-10-CM

## 2020-03-23 LAB — POCT GLYCOSYLATED HEMOGLOBIN (HGB A1C): Hemoglobin A1C: 5.9 % — AB (ref 4.0–5.6)

## 2020-03-23 NOTE — Assessment & Plan Note (Signed)
stable overall by history and exam, recent data reviewed with pt, and pt to continue medical treatment as before,  to f/u any worsening symptoms or concerns, for poct a1c today

## 2020-03-23 NOTE — Assessment & Plan Note (Addendum)

## 2020-03-23 NOTE — Addendum Note (Signed)
Addended by: Marijean Heath R on: 03/23/2020 04:51 PM   Modules accepted: Orders

## 2020-03-23 NOTE — Patient Instructions (Addendum)
Please remember to have the Carrizo booster soon, such as [facility] or [email or web address]  You had the shingles shot #1 today  Please return for a Nurse Visit in 2 months or after for the shingles shot #2  Your A1c was ok today  Please continue all other medications as before, and refills have been done if requested.  Please have the pharmacy call with any other refills you may need.  Please continue your efforts at being more active, low cholesterol diet, and weight control.  You are otherwise up to date with prevention measures today.  Please keep your appointments with your specialists as you may have planned  Please make an Appointment to return in 6 months, or sooner if needed, also with Lab Appointment for testing done 3-5 days before at the [facility] (so this is for TWO appointments - please see the scheduling desk as you leave)

## 2020-03-23 NOTE — Assessment & Plan Note (Signed)
Very low normal, Please take OTC Vitamin D3 at 2000 units per day, indefinitely.

## 2020-03-23 NOTE — Assessment & Plan Note (Signed)
stable overall by history and exam, recent data reviewed with pt, and pt to continue medical treatment as before,  to f/u any worsening symptoms or concerns  

## 2020-03-23 NOTE — Progress Notes (Addendum)
Subjective:    Patient ID: [name] [name], male    DOB: 02/06/1961, 59 y.o.   MRN: [id number]  HPI  Here to f/u; overall doing ok,  Pt denies chest pain, increasing sob or doe, wheezing, orthopnea, PND, increased LE swelling, palpitations, dizziness or syncope.  Pt denies new neurological symptoms such as new headache, or facial or extremity weakness or numbness.  Pt denies polydipsia, polyuria, or low sugar episode.  Pt states overall good compliance with meds, mostly trying to follow appropriate diet, with wt overall down about 5 lbs with walking, plans to do more with wife to try to get off another 5 lbs  No new complaints Past Medical History:  Diagnosis Date  . Arthritis    "maybe in my hands" (10/31/2016)  . Atrial flutter ([place])   . CHEST PAIN-PRECORDIAL 05/25/2009  . ERECTILE DYSFUNCTION, ORGANIC 01/16/2010  . GERD (gastroesophageal reflux disease)   . HYPERLIPIDEMIA 12/15/2007  . HYPERTENSION 12/15/2007  . HYPOKALEMIA 12/15/2007  . Internal hemorrhoids   . OSA (obstructive sleep apnea) 09/22/2019  . Persistent atrial fibrillation ([place])    a. prior ablation/flecainide; placed on Tikosyn 07/2017.  Marland Kitchen POSTURAL LIGHTHEADEDNESS 07/06/2008  . Sinus bradycardia   . Visit for monitoring Tikosyn therapy 07/03/2017   Past Surgical History:  Procedure Laterality Date  . ATRIAL FIBRILLATION ABLATION N/A 08/13/2011   Procedure: ATRIAL FIBRILLATION ABLATION;  Surgeon: Thompson Grayer, MD;  Location: [facility] CATH LAB;  Service: Cardiovascular;  Laterality: N/A;  . ATRIAL FIBRILLATION ABLATION  10/31/2016  . ATRIAL FIBRILLATION ABLATION N/A 10/31/2016   Procedure: Atrial Fibrillation Ablation;  Surgeon: Thompson Grayer, MD;  Location: [facility] CV LAB;  Service: Cardiovascular;  Laterality: N/A;  . ATRIAL FLUTTER ABLATION N/A 12/27/2011   CTI repeat ablation by Dr Rayann Heman  . CARDIOVERSION  11/21/2011   Procedure: CARDIOVERSION;  Surgeon: Larey Dresser, MD;  Location: [facility];  Service: Cardiovascular;   Laterality: N/A;  . CARDIOVERSION N/A 12/02/2016   Procedure: CARDIOVERSION;  Surgeon: [name], MD;  Location: [facility];  Service: Cardiovascular;  Laterality: N/A;  . CHALAZION EXCISION Right   . ELBOW SURGERY Right early 1980s   bone spur/chip; "opened me up"  . INGUINAL HERNIA REPAIR Right 11/2006  . TEE WITHOUT CARDIOVERSION  08/12/2011   Procedure: TRANSESOPHAGEAL ECHOCARDIOGRAM (TEE);  Surgeon: Fay Records, MD;  Location: [facility] ENDOSCOPY;  Service: Cardiovascular;  Laterality: N/A;  . TEE WITHOUT CARDIOVERSION N/A 10/30/2016   Procedure: TRANSESOPHAGEAL ECHOCARDIOGRAM (TEE);  Surgeon: Josue Hector, MD;  Location: [facility] ENDOSCOPY;  Service: Cardiovascular;  Laterality: N/A;  . TEE WITHOUT CARDIOVERSION N/A 12/02/2016   Procedure: TRANSESOPHAGEAL ECHOCARDIOGRAM (TEE);  Surgeon: [name], MD;  Location: [facility] ENDOSCOPY;  Service: Cardiovascular;  Laterality: N/A;    reports that he quit smoking about 9 years ago. His smoking use included cigarettes. He has a 1.50 pack-year smoking history. He has never used smokeless tobacco. He reports current alcohol use. He reports that he does not use drugs. family history includes Heart disease in his sister; Other in his father; Sleep apnea in his sister; Stroke in his mother; Thyroid disease in his sister. Allergies  Allergen Reactions  . Ibuprofen Palpitations and Other (See Comments)    Rapid heart beat   Current Outpatient Medications on File Prior to Visit  Medication Sig Dispense Refill  . amLODipine (NORVASC) 2.5 MG tablet Take 1 tablet (2.5 mg total) by mouth daily. 90 tablet 3  . Arginine 500 MG CAPS Take 500  mg by mouth daily.     . Ascorbic Acid (VITAMIN C) 1000 MG tablet Take 1,000 mg by mouth daily.     Marland Kitchen atorvastatin (LIPITOR) 20 MG tablet Take 1 tablet (20 mg total) by mouth daily. 90 tablet 3  . cholecalciferol (VITAMIN D-400) 10 MCG (400 UNIT) TABS tablet Take 400 Units by mouth daily.     Marland Kitchen diltiazem (CARDIZEM CD) 120  MG 24 hr capsule Take 1 capsule (120 mg total) by mouth daily. 90 capsule 3  . diltiazem (CARDIZEM) 30 MG tablet Take 1 tablet every 4 hours AS NEEDED for heart rate over 100 45 tablet 2  . dofetilide (TIKOSYN) 250 MCG capsule Take 1 capsule (250 mcg total) by mouth 2 (two) times daily. 180 capsule 2  . doxazosin (CARDURA) 4 MG tablet TAKE 1 TABLET BY MOUTH AT BEDTIME 30 tablet 2  . ELIQUIS 5 MG TABS tablet Take 1 tablet by mouth twice daily 60 tablet 6  . fexofenadine (ALLEGRA) 180 MG tablet Take 1 tablet (180 mg total) by mouth daily. 30 tablet 2  . irbesartan (AVAPRO) 300 MG tablet Take 1 tablet by mouth once daily 30 tablet 0  . Lysine HCl 500 MG TABS Take 500 mg by mouth daily.     . Multiple Vitamins-Minerals (MULTIVITAL) tablet Take 1 tablet by mouth daily.    . nitroGLYCERIN (NITROSTAT) 0.4 MG SL tablet Place 1 tablet (0.4 mg total) under the tongue every 5 (five) minutes as needed for chest pain. 30 tablet 0  . triamcinolone (NASACORT) 55 MCG/ACT AERO nasal inhaler Place 2 sprays into the nose daily. 1 Inhaler 12   No current facility-administered medications on file prior to visit.   Review of Systems ;All otherwise neg per pt     Objective:   Physical Exam BP 110/80 (BP Location: Left Arm, Patient Position: Sitting, Cuff Size: Large)   Pulse (!) 48   Temp 97.9 F (36.6 C) (Oral)   Ht 6' (1.829 m)   Wt 204 lb (92.5 kg)   SpO2 98%   BMI 27.67 kg/m  VS noted,  Constitutional: Pt appears in NAD HENT: Head: NCAT.  Right Ear: External ear normal.  Left Ear: External ear normal.  Eyes: . Pupils are equal, round, and reactive to light. Conjunctivae and EOM are normal Nose: without d/c or deformity Neck: Neck supple. Gross normal ROM Cardiovascular: Normal rate and regular rhythm.   Pulmonary/Chest: Effort normal and breath sounds without rales or wheezing.  Abd:  Soft, NT, ND, + BS, no organomegaly Neurological: Pt is alert. At baseline orientation, motor grossly  intact Skin: Skin is warm. No rashes, other new lesions, no LE edema Psychiatric: Pt behavior is normal without agitation  All otherwise neg per pt  Lab Results  Component Value Date   WBC 7.9 09/22/2019   HGB 13.8 09/22/2019   HCT 41.4 09/22/2019   PLT 248.0 09/22/2019   GLUCOSE 130 (H) 01/13/2020   CHOL 147 09/22/2019   TRIG 157.0 (H) 09/22/2019   HDL 53.90 09/22/2019   LDLDIRECT 115.7 07/13/2013   LDLCALC 62 09/22/2019   ALT 27 09/22/2019   AST 20 09/22/2019   NA 137 01/13/2020   K 4.7 01/13/2020   CL 107 01/13/2020   CREATININE 1.31 (H) 01/13/2020   BUN 22 (H) 01/13/2020   CO2 20 (L) 01/13/2020   TSH 1.40 09/22/2019   PSA 0.70 09/22/2019   INR 1.8 (A) 09/11/2018   HGBA1C 5.9 09/22/2019  Contains abnormal dataPOCT glycosylated  hemoglobin (Hb A1C) Order: [id number] Status:  Final result Visible to patient:  Yes (not seen) Dx:  Hyperglycemia  0 Result Notes  Ref Range & Units 16:49 6 mo ago 1 yr ago 2 yr ago 3 yr ago  Hemoglobin A1C 4.0 - 5.6 % 5.9Abnormal  5.9 R, CM  6.1 R, CM  6.0 R, CM  6.1 R            Assessment & Plan:

## 2020-04-20 ENCOUNTER — Other Ambulatory Visit: Payer: Self-pay | Admitting: Internal Medicine

## 2020-04-20 NOTE — Telephone Encounter (Signed)
Please refill as per office routine med refill policy (all routine meds refilled for 3 mo or monthly per pt preference up to one year from last visit, then month to month grace period for 3 mo, then further med refills will have to be denied)  

## 2020-05-08 DIAGNOSIS — S139XXA Sprain of joints and ligaments of unspecified parts of neck, initial encounter: Secondary | ICD-10-CM | POA: Diagnosis not present

## 2020-05-10 DIAGNOSIS — M542 Cervicalgia: Secondary | ICD-10-CM | POA: Diagnosis not present

## 2020-05-11 ENCOUNTER — Other Ambulatory Visit: Payer: Self-pay | Admitting: Internal Medicine

## 2020-05-16 DIAGNOSIS — M542 Cervicalgia: Secondary | ICD-10-CM | POA: Diagnosis not present

## 2020-05-18 ENCOUNTER — Other Ambulatory Visit: Payer: Self-pay | Admitting: Internal Medicine

## 2020-05-18 DIAGNOSIS — M542 Cervicalgia: Secondary | ICD-10-CM | POA: Diagnosis not present

## 2020-05-19 NOTE — Telephone Encounter (Signed)
Please refill as per office routine med refill policy (all routine meds refilled for 3 mo or monthly per pt preference up to one year from last visit, then month to month grace period for 3 mo, then further med refills will have to be denied)  

## 2020-05-24 ENCOUNTER — Ambulatory Visit: Payer: 59

## 2020-05-26 ENCOUNTER — Other Ambulatory Visit: Payer: Self-pay | Admitting: Internal Medicine

## 2020-05-29 DIAGNOSIS — M542 Cervicalgia: Secondary | ICD-10-CM | POA: Diagnosis not present

## 2020-05-29 NOTE — Telephone Encounter (Signed)
Eliquis 5mg  refill request received. Patient is 59 years old, weight-92.5kg, Crea-1.31 on 01/13/2020, Diagnosis-Afib, and last seen by 03/14/2020 on 01/13/2020. Dose is appropriate based on dosing criteria. Will send in refill to requested pharmacy.

## 2020-05-31 ENCOUNTER — Ambulatory Visit: Payer: 59

## 2020-05-31 DIAGNOSIS — M542 Cervicalgia: Secondary | ICD-10-CM | POA: Diagnosis not present

## 2020-06-09 ENCOUNTER — Other Ambulatory Visit: Payer: Self-pay | Admitting: Internal Medicine

## 2020-06-09 NOTE — Telephone Encounter (Signed)
Please refill as per office routine med refill policy (all routine meds refilled for 3 mo or monthly per pt preference up to one year from last visit, then month to month grace period for 3 mo, then further med refills will have to be denied)  

## 2020-06-25 ENCOUNTER — Other Ambulatory Visit: Payer: Self-pay | Admitting: Internal Medicine

## 2020-06-25 NOTE — Telephone Encounter (Signed)
Please refill as per office routine med refill policy (all routine meds refilled for 3 mo or monthly per pt preference up to one year from last visit, then month to month grace period for 3 mo, then further med refills will have to be denied)  

## 2020-07-12 ENCOUNTER — Other Ambulatory Visit: Payer: Self-pay | Admitting: Internal Medicine

## 2020-07-12 ENCOUNTER — Other Ambulatory Visit: Payer: Self-pay

## 2020-07-12 ENCOUNTER — Encounter (HOSPITAL_COMMUNITY): Payer: Self-pay | Admitting: Nurse Practitioner

## 2020-07-12 ENCOUNTER — Ambulatory Visit (HOSPITAL_COMMUNITY)
Admission: RE | Admit: 2020-07-12 | Discharge: 2020-07-12 | Disposition: A | Discharge status: Home / Self Care | Payer: 59 | Source: Ambulatory Visit | Attending: Nurse Practitioner | Admitting: Nurse Practitioner

## 2020-07-12 VITALS — BP 114/74 | HR 50 | Ht 72.0 in | Wt 208.2 lb

## 2020-07-12 DIAGNOSIS — D6869 Other thrombophilia: Secondary | ICD-10-CM | POA: Diagnosis not present

## 2020-07-12 DIAGNOSIS — I10 Essential (primary) hypertension: Secondary | ICD-10-CM | POA: Diagnosis not present

## 2020-07-12 DIAGNOSIS — I480 Paroxysmal atrial fibrillation: Secondary | ICD-10-CM

## 2020-07-12 DIAGNOSIS — I4891 Unspecified atrial fibrillation: Secondary | ICD-10-CM | POA: Diagnosis not present

## 2020-07-12 DIAGNOSIS — Z7901 Long term (current) use of anticoagulants: Secondary | ICD-10-CM | POA: Diagnosis not present

## 2020-07-12 DIAGNOSIS — Z79899 Other long term (current) drug therapy: Secondary | ICD-10-CM | POA: Diagnosis not present

## 2020-07-12 DIAGNOSIS — Z5181 Encounter for therapeutic drug level monitoring: Secondary | ICD-10-CM | POA: Diagnosis not present

## 2020-07-12 DIAGNOSIS — Z87891 Personal history of nicotine dependence: Secondary | ICD-10-CM | POA: Diagnosis not present

## 2020-07-12 LAB — BASIC METABOLIC PANEL
Anion gap: 10 (ref 5–15)
BUN: 18 mg/dL (ref 6–20)
CO2: 22 mmol/L (ref 22–32)
Calcium: 9.1 mg/dL (ref 8.9–10.3)
Chloride: 108 mmol/L (ref 98–111)
Creatinine, Ser: 1.19 mg/dL (ref 0.61–1.24)
GFR, Estimated: >60 mL/min (ref >60)
Glucose, Bld: 121 mg/dL — ABNORMAL HIGH (ref 70–99)
Potassium: 4.4 mmol/L (ref 3.5–5.1)
Sodium: 140 mmol/L (ref 135–145)

## 2020-07-12 LAB — MAGNESIUM: Magnesium: 2.1 mg/dL (ref 1.7–2.4)

## 2020-07-12 NOTE — Telephone Encounter (Signed)
Please refill as per office routine med refill policy (all routine meds refilled for 3 mo or monthly per pt preference up to one year from last visit, then month to month grace period for 3 mo, then further med refills will have to be denied)  

## 2020-07-12 NOTE — Progress Notes (Signed)
Primary Care Physician: Biagio Borg, MD Referring Physician: Luana Shu is a 60 y.o. male with a h/o afib in the afib clinic for Tikosyn surveillance. He reports that he has not noted any sustained afib. He will have an infrequent breakthrough episode, rarely lasts more than one day.  He is being compliant with  Tikosyn. and  eliquis with a CHA2DS2VASc score of 1. No bleeding issues.  He presented in afib this am, but by auscultation was back in SR by end of the appointment. He has had his covid shots this  last spring.   F/u in  afib clinic, 07/12/20. He has been maintaining SR but feels draggy in the am. His BP is soft in the am and he may not need the low dose amlodipine. He continues on tikosyn and eliquis. .   Today, he denies symptoms of palpitations, chest pain, shortness of breath, orthopnea, PND, lower extremity edema, dizziness, presyncope, syncope, or neurologic sequela. The patient is tolerating medications without difficulties and is otherwise without complaint today.   Past Medical History:  Diagnosis Date  . Arthritis    "maybe in my hands" (10/31/2016)  . Atrial flutter ([place])   . CHEST PAIN-PRECORDIAL 05/25/2009  . ERECTILE DYSFUNCTION, ORGANIC 01/16/2010  . GERD (gastroesophageal reflux disease)   . HYPERLIPIDEMIA 12/15/2007  . HYPERTENSION 12/15/2007  . HYPOKALEMIA 12/15/2007  . Internal hemorrhoids   . OSA (obstructive sleep apnea) 09/22/2019  . Persistent atrial fibrillation ([place])    a. prior ablation/flecainide; placed on Tikosyn 07/2017.  Marland Kitchen POSTURAL LIGHTHEADEDNESS 07/06/2008  . Sinus bradycardia   . Visit for monitoring Tikosyn therapy 07/03/2017   Past Surgical History:  Procedure Laterality Date  . ATRIAL FIBRILLATION ABLATION N/A 08/13/2011   Procedure: ATRIAL FIBRILLATION ABLATION;  Surgeon: Thompson Grayer, MD;  Location: [facility] CATH LAB;  Service: Cardiovascular;  Laterality: N/A;  . ATRIAL FIBRILLATION ABLATION  10/31/2016  . ATRIAL FIBRILLATION  ABLATION N/A 10/31/2016   Procedure: Atrial Fibrillation Ablation;  Surgeon: Thompson Grayer, MD;  Location: [place] CV LAB;  Service: Cardiovascular;  Laterality: N/A;  . ATRIAL FLUTTER ABLATION N/A 12/27/2011   CTI repeat ablation by Dr Rayann Heman  . CARDIOVERSION  11/21/2011   Procedure: CARDIOVERSION;  Surgeon: Larey Dresser, MD;  Location: [place];  Service: Cardiovascular;  Laterality: N/A;  . CARDIOVERSION N/A 12/02/2016   Procedure: CARDIOVERSION;  Surgeon: [name], MD;  Location: [place];  Service: Cardiovascular;  Laterality: N/A;  . CHALAZION EXCISION Right   . ELBOW SURGERY Right early 1980s   bone spur/chip; "opened me up"  . INGUINAL HERNIA REPAIR Right 11/2006  . TEE WITHOUT CARDIOVERSION  08/12/2011   Procedure: TRANSESOPHAGEAL ECHOCARDIOGRAM (TEE);  Surgeon: Fay Records, MD;  Location: [facility] ENDOSCOPY;  Service: Cardiovascular;  Laterality: N/A;  . TEE WITHOUT CARDIOVERSION N/A 10/30/2016   Procedure: TRANSESOPHAGEAL ECHOCARDIOGRAM (TEE);  Surgeon: Josue Hector, MD;  Location: [facility] ENDOSCOPY;  Service: Cardiovascular;  Laterality: N/A;  . TEE WITHOUT CARDIOVERSION N/A 12/02/2016   Procedure: TRANSESOPHAGEAL ECHOCARDIOGRAM (TEE);  Surgeon: [name], MD;  Location: [facility] ENDOSCOPY;  Service: Cardiovascular;  Laterality: N/A;    Current Outpatient Medications  Medication Sig Dispense Refill  . amoxicillin (AMOXIL) 500 MG capsule Take 500 mg by mouth 3 (three) times daily.    . Arginine 500 MG CAPS Take 500 mg by mouth daily.     . Ascorbic Acid (VITAMIN C) 1000 MG tablet Take 1,000 mg by mouth daily.     Marland Kitchen  atorvastatin (LIPITOR) 20 MG tablet Take 1 tablet (20 mg total) by mouth daily. 90 tablet 3  . cholecalciferol (VITAMIN D3) 10 MCG (400 UNIT) TABS tablet Take 400 Units by mouth daily.     Marland Kitchen diltiazem (CARDIZEM CD) 120 MG 24 hr capsule Take 1 capsule (120 mg total) by mouth daily. 90 capsule 3  . diltiazem (CARDIZEM) 30 MG tablet Take 1 tablet every 4 hours AS  NEEDED for heart rate over 100 45 tablet 2  . dofetilide (TIKOSYN) 250 MCG capsule Take 1 capsule (250 mcg total) by mouth 2 (two) times daily. 180 capsule 2  . doxazosin (CARDURA) 4 MG tablet TAKE 1 TABLET BY MOUTH AT BEDTIME 30 tablet 0  . ELIQUIS 5 MG TABS tablet Take 1 tablet by mouth twice daily 60 tablet 6  . fexofenadine (ALLEGRA) 180 MG tablet Take 1 tablet (180 mg total) by mouth daily. 30 tablet 2  . irbesartan (AVAPRO) 300 MG tablet Take 1 tablet by mouth once daily 30 tablet 0  . Lysine HCl 500 MG TABS Take 500 mg by mouth daily.     . Multiple Vitamins-Minerals (MULTIVITAL) tablet Take 1 tablet by mouth daily.    . nitroGLYCERIN (NITROSTAT) 0.4 MG SL tablet Place 1 tablet (0.4 mg total) under the tongue every 5 (five) minutes as needed for chest pain. 30 tablet 0  . triamcinolone (NASACORT) 55 MCG/ACT AERO nasal inhaler Place 2 sprays into the nose daily. 1 Inhaler 12   No current facility-administered medications for this encounter.    Allergies  Allergen Reactions  . Ibuprofen Palpitations and Other (See Comments)    Rapid heart beat    Social History   Socioeconomic History  . Marital status: Married    Spouse name: Not on file  . Number of children: 2  . Years of education: Not on file  . Highest education level: Not on file  Occupational History  . Occupation: SECURITY    Employer: [employer]  Tobacco Use  . Smoking status: Former Smoker    Packs/day: 0.10    Years: 15.00    Pack years: 1.50    Types: Cigarettes    Quit date: 12/17/2010    Years since quitting: 9.5  . Smokeless tobacco: Never Used  Vaping Use  . Vaping Use: Never used  Substance and Sexual Activity  . Alcohol use: Yes    Comment: "1 glass of wine/month maybe" (10/31/2016)  . Drug use: No  . Sexual activity: Not Currently  Other Topics Concern  . Not on file  Social History Narrative   Pt lives in [place] with spouse. 2 grown children.   Works at [employer] in  [place]   Social Determinants of Health   Financial Resource Strain: Not on file  Food Insecurity: Not on file  Transportation Needs: Not on file  Physical Activity: Not on file  Stress: Not on file  Social Connections: Not on file  Intimate Partner Violence: Not on file    Family History  Problem Relation Age of Onset  . Stroke Mother   . Heart disease Sister   . Thyroid disease Sister   . Sleep apnea Sister   . Other Father        deceased stomach hemorrhage  . Anesthesia problems Neg Hx     ROS- All systems are reviewed and negative except as per the HPI above  Physical Exam: Vitals:   07/12/20 0949  BP: 114/74  Pulse: (!) 50  Weight:  94.4 kg  Height: 6' (1.829 m)   Wt Readings from Last 3 Encounters:  07/12/20 94.4 kg  03/23/20 92.5 kg  01/13/20 93.4 kg    Labs: Lab Results  Component Value Date   NA 137 01/13/2020   K 4.7 01/13/2020   CL 107 01/13/2020   CO2 20 (L) 01/13/2020   GLUCOSE 130 (H) 01/13/2020   BUN 22 (H) 01/13/2020   CREATININE 1.31 (H) 01/13/2020   CALCIUM 9.1 01/13/2020   MG 2.2 01/13/2020   Lab Results  Component Value Date   INR 1.8 (A) 09/11/2018   Lab Results  Component Value Date   CHOL 147 09/22/2019   HDL 53.90 09/22/2019   LDLCALC 62 09/22/2019   TRIG 157.0 (H) 09/22/2019     GEN- The patient is well appearing, alert and oriented x 3 today.   Head- normocephalic, atraumatic Eyes-  Sclera clear, conjunctiva pink Ears- hearing intact Oropharynx- clear Neck- supple, no JVP Lymph- no cervical lymphadenopathy Lungs- Clear to ausculation bilaterally, normal work of breathing Heart-slow, regular on presentation by ekg, but with regular rhythm on auscultation,  no murmurs, rubs or gallops, PMI not laterally displaced GI- soft, NT, ND, + BS Extremities- no clubbing, cyanosis, or edema MS- no significant deformity or atrophy Skin- no rash or lesion Psych- euthymic mood, full affect Neuro- strength and sensation are  intact  EKG-  Sinus brady at 50 bpm, pr int 158 ms, qrs int 82 ms, qtc 479 ms    Assessment and Plan: 1. Afib  Staying in SR with Tikosyn, qtc stable Continue dofetilide 250 mcg  bid and diltiazem 120 mg qd Bmet/mag today   2. CHA2DS2VASc score of 1 Continue eliquis 5 mg bid per pt's choice  3. HTN BP soft and feels draggy in the am May be overmedicated  Stop pm 2.5 mg amlodipine  Nurse BP check in 2 weeks   F/u in 6 months   Butch Penny C. Mahaila Tischer, [facility] [address] [place], [place] [address] [phone]

## 2020-07-12 NOTE — Patient Instructions (Signed)
Stop amlodipine.

## 2020-07-25 ENCOUNTER — Other Ambulatory Visit: Payer: Self-pay | Admitting: Internal Medicine

## 2020-07-25 NOTE — Telephone Encounter (Signed)
Please refill as per office routine med refill policy (all routine meds refilled for 3 mo or monthly per pt preference up to one year from last visit, then month to month grace period for 3 mo, then further med refills will have to be denied)  

## 2020-07-26 ENCOUNTER — Other Ambulatory Visit: Payer: Self-pay | Admitting: Internal Medicine

## 2020-07-26 ENCOUNTER — Encounter (HOSPITAL_COMMUNITY): Payer: Self-pay | Admitting: Nurse Practitioner

## 2020-07-26 ENCOUNTER — Other Ambulatory Visit: Payer: Self-pay

## 2020-07-26 ENCOUNTER — Ambulatory Visit (HOSPITAL_COMMUNITY)
Admission: RE | Admit: 2020-07-26 | Discharge: 2020-07-26 | Disposition: A | Discharge status: Home / Self Care | Payer: 59 | Source: Ambulatory Visit | Attending: Nurse Practitioner | Admitting: Nurse Practitioner

## 2020-07-26 VITALS — BP 120/80 | HR 88

## 2020-07-26 DIAGNOSIS — I4891 Unspecified atrial fibrillation: Secondary | ICD-10-CM | POA: Diagnosis not present

## 2020-07-26 DIAGNOSIS — D6869 Other thrombophilia: Secondary | ICD-10-CM

## 2020-07-26 DIAGNOSIS — I480 Paroxysmal atrial fibrillation: Secondary | ICD-10-CM

## 2020-07-26 NOTE — Telephone Encounter (Signed)
Please refill as per office routine med refill policy (all routine meds refilled for 3 mo or monthly per pt preference up to one year from last visit, then month to month grace period for 3 mo, then further med refills will have to be denied)  

## 2020-07-26 NOTE — Progress Notes (Signed)
Pt in for EKG and BP check after stopping amlodipine for feeling sluggish and hypotension. BP improved, today is 120/80. However, he went into afib Monday pm after hearing some stressful news. He will usually return to SR on his own. He is rate controlled. He will call on Friday if still out of rhythm for next steps.

## 2020-07-26 NOTE — Patient Instructions (Signed)
Call on Friday with update of heart rhythm [phone]

## 2020-08-16 ENCOUNTER — Other Ambulatory Visit: Payer: Self-pay | Admitting: Internal Medicine

## 2020-08-16 NOTE — Telephone Encounter (Signed)
Please refill as per office routine med refill policy (all routine meds refilled for 3 mo or monthly per pt preference up to one year from last visit, then month to month grace period for 3 mo, then further med refills will have to be denied)  

## 2020-08-17 ENCOUNTER — Other Ambulatory Visit: Payer: Self-pay | Admitting: Internal Medicine

## 2020-08-17 NOTE — Telephone Encounter (Signed)
Please refill as per office routine med refill policy (all routine meds refilled for 3 mo or monthly per pt preference up to one year from last visit, then month to month grace period for 3 mo, then further med refills will have to be denied)  

## 2020-08-22 ENCOUNTER — Other Ambulatory Visit: Payer: Self-pay | Admitting: Internal Medicine

## 2020-08-22 NOTE — Telephone Encounter (Signed)
Please refill as per office routine med refill policy (all routine meds refilled for 3 mo or monthly per pt preference up to one year from last visit, then month to month grace period for 3 mo, then further med refills will have to be denied)  

## 2020-08-24 NOTE — Telephone Encounter (Signed)
1.Medication Requested: irbesartan (AVAPRO) 300 MG tablet  diltiazem (CARDIZEM) 30 MG tablet    2. Pharmacy (Name, Street, [place]): [place] [place] - [address]  3. On Med List: yes   4. Last Visit with PCP: 10.21.21  5. Next visit date with PCP: 4.21.22   Agent: Please be advised that RX refills may take up to 3 business days. We ask that you follow-up with your pharmacy.

## 2020-09-14 ENCOUNTER — Other Ambulatory Visit: Payer: Self-pay | Admitting: Internal Medicine

## 2020-09-14 NOTE — Telephone Encounter (Signed)
Please refill as per office routine med refill policy (all routine meds refilled for 3 mo or monthly per pt preference up to one year from last visit, then month to month grace period for 3 mo, then further med refills will have to be denied)  

## 2020-09-21 ENCOUNTER — Ambulatory Visit: Payer: 59 | Admitting: Internal Medicine

## 2020-09-22 ENCOUNTER — Ambulatory Visit (INDEPENDENT_AMBULATORY_CARE_PROVIDER_SITE_OTHER): Payer: 59 | Admitting: Internal Medicine

## 2020-09-22 ENCOUNTER — Encounter: Payer: Self-pay | Admitting: Internal Medicine

## 2020-09-22 ENCOUNTER — Other Ambulatory Visit: Payer: Self-pay

## 2020-09-22 VITALS — BP 138/80 | HR 50 | Temp 98.3°F | Ht 72.0 in | Wt 202.0 lb

## 2020-09-22 DIAGNOSIS — Z860100 Personal history of colon polyps, unspecified: Secondary | ICD-10-CM

## 2020-09-22 DIAGNOSIS — E7800 Pure hypercholesterolemia, unspecified: Secondary | ICD-10-CM

## 2020-09-22 DIAGNOSIS — Z0000 Encounter for general adult medical examination without abnormal findings: Secondary | ICD-10-CM

## 2020-09-22 DIAGNOSIS — I10 Essential (primary) hypertension: Secondary | ICD-10-CM

## 2020-09-22 DIAGNOSIS — E538 Deficiency of other specified B group vitamins: Secondary | ICD-10-CM

## 2020-09-22 DIAGNOSIS — E559 Vitamin D deficiency, unspecified: Secondary | ICD-10-CM | POA: Diagnosis not present

## 2020-09-22 DIAGNOSIS — R739 Hyperglycemia, unspecified: Secondary | ICD-10-CM

## 2020-09-22 DIAGNOSIS — Z23 Encounter for immunization: Secondary | ICD-10-CM

## 2020-09-22 DIAGNOSIS — Z0001 Encounter for general adult medical examination with abnormal findings: Secondary | ICD-10-CM

## 2020-09-22 LAB — VITAMIN D 25 HYDROXY (VIT D DEFICIENCY, FRACTURES): VITD: 24.16 ng/mL — ABNORMAL LOW (ref 30.00–100.00)

## 2020-09-22 LAB — URINALYSIS, ROUTINE W REFLEX MICROSCOPIC
Bilirubin Urine: NEGATIVE
Ketones, ur: NEGATIVE
Leukocytes,Ua: NEGATIVE
Nitrite: NEGATIVE
Specific Gravity, Urine: 1.02 (ref 1.000–1.030)
Total Protein, Urine: NEGATIVE
Urine Glucose: NEGATIVE
Urobilinogen, UA: 0.2 (ref 0.0–1.0)
pH: 5 (ref 5.0–8.0)

## 2020-09-22 LAB — HEPATIC FUNCTION PANEL
ALT: 30 U/L (ref 0–53)
AST: 19 U/L (ref 0–37)
Albumin: 4.2 g/dL (ref 3.5–5.2)
Alkaline Phosphatase: 66 U/L (ref 39–117)
Bilirubin, Direct: 0.1 mg/dL (ref 0.0–0.3)
Total Bilirubin: 0.7 mg/dL (ref 0.2–1.2)
Total Protein: 7.3 g/dL (ref 6.0–8.3)

## 2020-09-22 LAB — HEMOGLOBIN A1C: Hgb A1c MFr Bld: 5.9 % (ref 4.6–6.5)

## 2020-09-22 LAB — BASIC METABOLIC PANEL
BUN: 22 mg/dL (ref 6–23)
CO2: 26 mEq/L (ref 19–32)
Calcium: 9.6 mg/dL (ref 8.4–10.5)
Chloride: 107 mEq/L (ref 96–112)
Creatinine, Ser: 1.13 mg/dL (ref 0.40–1.50)
GFR: 70.35 mL/min (ref >60.00)
Glucose, Bld: 88 mg/dL (ref 70–99)
Potassium: 4.3 mEq/L (ref 3.5–5.1)
Sodium: 141 mEq/L (ref 135–145)

## 2020-09-22 LAB — CBC WITH DIFFERENTIAL/PLATELET
Basophils Absolute: 0.1 10*3/uL (ref 0.0–0.1)
Basophils Relative: 0.8 % (ref 0.0–3.0)
Eosinophils Absolute: 0.2 10*3/uL (ref 0.0–0.7)
Eosinophils Relative: 2.1 % (ref 0.0–5.0)
HCT: 42.3 % (ref 39.0–52.0)
Hemoglobin: 14.1 g/dL (ref 13.0–17.0)
Lymphocytes Relative: 28.3 % (ref 12.0–46.0)
Lymphs Abs: 2.3 10*3/uL (ref 0.7–4.0)
MCHC: 33.4 g/dL (ref 30.0–36.0)
MCV: 88.6 fl (ref 78.0–100.0)
Monocytes Absolute: 1 10*3/uL (ref 0.1–1.0)
Monocytes Relative: 12.1 % — ABNORMAL HIGH (ref 3.0–12.0)
Neutro Abs: 4.7 10*3/uL (ref 1.4–7.7)
Neutrophils Relative %: 56.7 % (ref 43.0–77.0)
Platelets: 223 10*3/uL (ref 150.0–400.0)
RBC: 4.77 Mil/uL (ref 4.22–5.81)
RDW: 13.1 % (ref 11.5–15.5)
WBC: 8.3 10*3/uL (ref 4.0–10.5)

## 2020-09-22 LAB — PSA: PSA: 0.88 ng/mL (ref 0.10–4.00)

## 2020-09-22 LAB — TSH: TSH: 0.69 u[IU]/mL (ref 0.35–4.50)

## 2020-09-22 LAB — LIPID PANEL
Cholesterol: 209 mg/dL — ABNORMAL HIGH (ref 0–200)
HDL: 41.3 mg/dL (ref >39.00)
Total CHOL/HDL Ratio: 5
Triglycerides: 508 mg/dL — ABNORMAL HIGH (ref 0.0–149.0)

## 2020-09-22 LAB — VITAMIN B12: Vitamin B-12: 954 pg/mL — ABNORMAL HIGH (ref 211–911)

## 2020-09-22 LAB — LDL CHOLESTEROL, DIRECT: Direct LDL: 53 mg/dL

## 2020-09-22 MED ORDER — IRBESARTAN 300 MG PO TABS: 300.0000 mg | ORAL_TABLET | Freq: Every day | ORAL | 3 refills | Status: DC

## 2020-09-22 NOTE — Patient Instructions (Addendum)
You had the Shingles shot #2 today  You will be contacted regarding the referral for: colonoscopy  Ok to Double up on the Vitamin D supplement you currently take  Please continue all other medications as before, and refills have been done if requested - the irbesartan  Please have the pharmacy call with any other refills you may need.  Please continue your efforts at being more active, low cholesterol diet, and weight control.  You are otherwise up to date with prevention measures today.  Please keep your appointments with your specialists as you may have planned  Please go to the LAB at the blood drawing area for the tests to be done  You will be contacted by phone if any changes need to be made immediately.  Otherwise, you will receive a letter about your results with an explanation, but please check with MyChart first.  Please remember to sign up for MyChart if you have not done so, as this will be important to you in the future with finding out test results, communicating by private email, and scheduling acute appointments online when needed.  Please make an Appointment to return for your 1 year visit, or sooner if needed

## 2020-09-22 NOTE — Progress Notes (Signed)
Patient ID: [name] [name], male   DOB: [birth year], 60 y.o.   MRN: [id number]         Chief Complaint:: wellness exam and low vit d, htn, hyperglycemia       HPI:  [name] [name] is a 60 y.o. male here for wellness exam; due for colonoscopy, o/w up to date with preventive referrals and immunizations except also due for shingles shot #2                      Also not taking vit d.  Pt denies chest pain, increased sob or doe, wheezing, orthopnea, PND, increased LE swelling, palpitations, dizziness or syncope.  BP at home has been < 140/90. Denies new neuro focal s/s.   Pt denies polydipsia, polyuria,  Pt denies fever, wt loss, night sweats, loss of appetite, or other constitutional symptoms  No other new complaints   Has been out of BP med for 3 days Wt Readings from Last 3 Encounters:  09/22/20 202 lb (91.6 kg)  07/12/20 208 lb 3.2 oz (94.4 kg)  03/23/20 204 lb (92.5 kg)   BP Readings from Last 3 Encounters:  09/22/20 138/80  07/26/20 120/80  07/12/20 114/74   Immunization History  Administered Date(s) Administered  . Influenza Split 02/14/2011, 02/26/2012  . Influenza, High Dose Seasonal PF 03/06/2020  . Influenza, Seasonal, Injecte, Preservative Fre 04/03/2013  . Influenza,inj,Quad PF,6+ Mos 04/20/2018, 03/03/2019  . Influenza-Unspecified 04/01/2015, 03/02/2017, 03/09/2020  . Moderna Sars-Covid-2 Vaccination 08/06/2019, 09/07/2019, 05/23/2020  . Pneumococcal Conjugate-13 09/22/2019  . Td 01/12/2009  . Tdap 09/22/2019  . Zoster Recombinat (Shingrix) 03/23/2020, 09/22/2020  There are no preventive care reminders to display for this patient.    Past Medical History:  Diagnosis Date  . Arthritis    "maybe in my hands" (10/31/2016)  . Atrial flutter ([place])   . CHEST PAIN-PRECORDIAL 05/25/2009  . ERECTILE DYSFUNCTION, ORGANIC 01/16/2010  . GERD (gastroesophageal reflux disease)   . HYPERLIPIDEMIA 12/15/2007  . HYPERTENSION 12/15/2007  . HYPOKALEMIA 12/15/2007  . Internal  hemorrhoids   . OSA (obstructive sleep apnea) 09/22/2019  . Persistent atrial fibrillation ([place])    a. prior ablation/flecainide; placed on Tikosyn 07/2017.  Marland Kitchen POSTURAL LIGHTHEADEDNESS 07/06/2008  . Sinus bradycardia   . Visit for monitoring Tikosyn therapy 07/03/2017   Past Surgical History:  Procedure Laterality Date  . ATRIAL FIBRILLATION ABLATION N/A 08/13/2011   Procedure: ATRIAL FIBRILLATION ABLATION;  Surgeon: Thompson Grayer, MD;  Location: [facility] CATH LAB;  Service: Cardiovascular;  Laterality: N/A;  . ATRIAL FIBRILLATION ABLATION  10/31/2016  . ATRIAL FIBRILLATION ABLATION N/A 10/31/2016   Procedure: Atrial Fibrillation Ablation;  Surgeon: Thompson Grayer, MD;  Location: [place] CV LAB;  Service: Cardiovascular;  Laterality: N/A;  . ATRIAL FLUTTER ABLATION N/A 12/27/2011   CTI repeat ablation by Dr Rayann Heman  . CARDIOVERSION  11/21/2011   Procedure: CARDIOVERSION;  Surgeon: Larey Dresser, MD;  Location: [place];  Service: Cardiovascular;  Laterality: N/A;  . CARDIOVERSION N/A 12/02/2016   Procedure: CARDIOVERSION;  Surgeon: [name], MD;  Location: [place];  Service: Cardiovascular;  Laterality: N/A;  . CHALAZION EXCISION Right   . ELBOW SURGERY Right early 1980s   bone spur/chip; "opened me up"  . INGUINAL HERNIA REPAIR Right 11/2006  . TEE WITHOUT CARDIOVERSION  08/12/2011   Procedure: TRANSESOPHAGEAL ECHOCARDIOGRAM (TEE);  Surgeon: Fay Records, MD;  Location: [place];  Service: Cardiovascular;  Laterality: N/A;  . TEE WITHOUT CARDIOVERSION N/A 10/30/2016  Procedure: TRANSESOPHAGEAL ECHOCARDIOGRAM (TEE);  Surgeon: Josue Hector, MD;  Location: [facility] ENDOSCOPY;  Service: Cardiovascular;  Laterality: N/A;  . TEE WITHOUT CARDIOVERSION N/A 12/02/2016   Procedure: TRANSESOPHAGEAL ECHOCARDIOGRAM (TEE);  Surgeon: [name], MD;  Location: [facility] ENDOSCOPY;  Service: Cardiovascular;  Laterality: N/A;    reports that he quit smoking about 9 years ago. His smoking use included  cigarettes. He has a 1.50 pack-year smoking history. He has never used smokeless tobacco. He reports current alcohol use. He reports that he does not use drugs. family history includes Heart disease in his sister; Other in his father; Sleep apnea in his sister; Stroke in his mother; Thyroid disease in his sister. Allergies  Allergen Reactions  . Ibuprofen Palpitations and Other (See Comments)    Rapid heart beat   Current Outpatient Medications on File Prior to Visit  Medication Sig Dispense Refill  . Arginine 500 MG CAPS Take 500 mg by mouth daily.     . Ascorbic Acid (VITAMIN C) 1000 MG tablet Take 1,000 mg by mouth daily.     Marland Kitchen atorvastatin (LIPITOR) 20 MG tablet Take 1 tablet by mouth daily 30 tablet 0  . cholecalciferol (VITAMIN D3) 10 MCG (400 UNIT) TABS tablet Take 400 Units by mouth daily.     Marland Kitchen diltiazem (CARDIZEM CD) 120 MG 24 hr capsule Take 1 capsule (120 mg total) by mouth daily. 90 capsule 3  . diltiazem (CARDIZEM) 30 MG tablet Take 1 tablet every 4 hours AS NEEDED for heart rate over 100 45 tablet 2  . dofetilide (TIKOSYN) 250 MCG capsule Take 1 capsule (250 mcg total) by mouth 2 (two) times daily. 180 capsule 2  . doxazosin (CARDURA) 4 MG tablet TAKE 1 TABLET BY MOUTH AT BEDTIME 30 tablet 0  . ELIQUIS 5 MG TABS tablet Take 1 tablet by mouth twice daily 60 tablet 6  . fexofenadine (ALLEGRA) 180 MG tablet Take 1 tablet (180 mg total) by mouth daily. 30 tablet 2  . Lysine HCl 500 MG TABS Take 500 mg by mouth daily.     . Multiple Vitamins-Minerals (MULTIVITAL) tablet Take 1 tablet by mouth daily.    . nitroGLYCERIN (NITROSTAT) 0.4 MG SL tablet Place 1 tablet (0.4 mg total) under the tongue every 5 (five) minutes as needed for chest pain. 30 tablet 0  . triamcinolone (NASACORT) 55 MCG/ACT AERO nasal inhaler Place 2 sprays into the nose daily. 1 Inhaler 12   No current facility-administered medications on file prior to visit.        ROS:  All others reviewed and  negative.  Objective        PE:  BP 138/80 (BP Location: Right Arm, Patient Position: Sitting, Cuff Size: Large)   Pulse (!) 50   Temp 98.3 F (36.8 C) (Oral)   Ht 6' (1.829 m)   Wt 202 lb (91.6 kg)   SpO2 98%   BMI 27.40 kg/m                 Constitutional: Pt appears in NAD               HENT: Head: NCAT.                Right Ear: External ear normal.                 Left Ear: External ear normal.                Eyes: . Pupils  are equal, round, and reactive to light. Conjunctivae and EOM are normal               Nose: without d/c or deformity               Neck: Neck supple. Gross normal ROM               Cardiovascular: Normal rate and regular rhythm.                 Pulmonary/Chest: Effort normal and breath sounds without rales or wheezing.                Abd:  Soft, NT, ND, + BS, no organomegaly               Neurological: Pt is alert. At baseline orientation, motor grossly intact               Skin: Skin is warm. No rashes, no other new lesions, LE edema - none               Psychiatric: Pt behavior is normal without agitation   Micro: none  Cardiac tracings I have personally interpreted today:  none  Pertinent Radiological findings (summarize): none   Lab Results  Component Value Date   WBC 8.3 09/22/2020   HGB 14.1 09/22/2020   HCT 42.3 09/22/2020   PLT 223.0 09/22/2020   GLUCOSE 88 09/22/2020   CHOL 209 (H) 09/22/2020   TRIG (H) 09/22/2020    508.0 Triglyceride is over 400; calculations on Lipids are invalid.   HDL 41.30 09/22/2020   LDLDIRECT 53.0 09/22/2020   LDLCALC 62 09/22/2019   ALT 30 09/22/2020   AST 19 09/22/2020   NA 141 09/22/2020   K 4.3 09/22/2020   CL 107 09/22/2020   CREATININE 1.13 09/22/2020   BUN 22 09/22/2020   CO2 26 09/22/2020   TSH 0.69 09/22/2020   PSA 0.88 09/22/2020   INR 1.8 (A) 09/11/2018   HGBA1C 5.9 09/22/2020   Assessment/Plan:  KRISTIEN MEHAFFEY is a 60 y.o. Black or African American [2] male with  has a past medical  history of Arthritis, Atrial flutter ([place]), CHEST PAIN-PRECORDIAL (05/25/2009), ERECTILE DYSFUNCTION, ORGANIC (01/16/2010), GERD (gastroesophageal reflux disease), HYPERLIPIDEMIA (12/15/2007), HYPERTENSION (12/15/2007), HYPOKALEMIA (12/15/2007), Internal hemorrhoids, OSA (obstructive sleep apnea) (09/22/2019), Persistent atrial fibrillation ([facility]), POSTURAL LIGHTHEADEDNESS (07/06/2008), Sinus bradycardia, and Visit for monitoring Tikosyn therapy (07/03/2017).  Encounter for well adult exam with abnormal findings Age and sex appropriate education and counseling updated with regular exercise and diet Referrals for preventative services - for colonoscopy  Immunizations addressed - for shingles shot #2 Smoking counseling  - none needed Evidence for depression or other mood disorder - none significant Most recent labs reviewed. I have personally reviewed and have noted: 1) the patient's medical and social history 2) The patient's current medications and supplements 3) The patient's height, weight, and BMI have been recorded in the chart   Vitamin D deficiency Last vitamin D Lab Results  Component Value Date   VD25OH 24.16 (L) 09/22/2020   Low, to start oral replacement  Hyperglycemia Lab Results  Component Value Date   HGBA1C 5.9 09/22/2020   Stable, pt to continue current medical treatment  - diet and wt control   HLD (hyperlipidemia) Lab Results  Component Value Date   LDLCALC 62 09/22/2019   Stable, pt to continue current statin  - lipitor 20   Essential hypertension BP Readings from Last 3 Encounters:  09/22/20 138/80  07/26/20 120/80  07/12/20 114/74   Stable, pt to continue medical treatment cardizem   Followup: Return in about 1 year (around 09/22/2021).  Cathlean Cower, MD 09/24/2020 11:52 AM [facility]

## 2020-09-24 ENCOUNTER — Encounter: Payer: Self-pay | Admitting: Internal Medicine

## 2020-09-24 NOTE — Assessment & Plan Note (Signed)
Last vitamin D ?Lab Results  ?Component Value Date  ? VD25OH 24.16 (L) 09/22/2020  ? ?Low, to start oral replacement ? ?

## 2020-09-24 NOTE — Assessment & Plan Note (Signed)
BP Readings from Last 3 Encounters:  09/22/20 138/80  07/26/20 120/80  07/12/20 114/74   Stable, pt to continue medical treatment cardizem

## 2020-09-24 NOTE — Assessment & Plan Note (Signed)
Lab Results  Component Value Date   LDLCALC 62 09/22/2019   Stable, pt to continue current statin  - lipitor 20

## 2020-09-24 NOTE — Assessment & Plan Note (Addendum)
Age and sex appropriate education and counseling updated with regular exercise and diet Referrals for preventative services - for colonoscopy  Immunizations addressed - for shingles shot #2 Smoking counseling  - none needed Evidence for depression or other mood disorder - none significant Most recent labs reviewed. I have personally reviewed and have noted: 1) the patient's medical and social history 2) The patient's current medications and supplements 3) The patient's height, weight, and BMI have been recorded in the chart

## 2020-09-24 NOTE — Assessment & Plan Note (Signed)
Lab Results  Component Value Date   HGBA1C 5.9 09/22/2020   Stable, pt to continue current medical treatment  - diet and wt control

## 2020-09-25 ENCOUNTER — Other Ambulatory Visit: Payer: Self-pay | Admitting: Internal Medicine

## 2020-10-09 ENCOUNTER — Telehealth: Payer: Self-pay | Admitting: Internal Medicine

## 2020-10-09 DIAGNOSIS — J3489 Other specified disorders of nose and nasal sinuses: Secondary | ICD-10-CM

## 2020-10-09 NOTE — Telephone Encounter (Signed)
Ok done

## 2020-10-09 NOTE — Telephone Encounter (Signed)
Patient called and was wondering if Dr. Jenny Reichmann could place a referral for an ENT. He said that his nose and between his eyes swell. He can be reached at [phone]. Please advise

## 2020-10-10 ENCOUNTER — Other Ambulatory Visit (HOSPITAL_COMMUNITY): Payer: Self-pay | Admitting: Nurse Practitioner

## 2020-10-17 ENCOUNTER — Other Ambulatory Visit: Payer: Self-pay | Admitting: Internal Medicine

## 2020-11-08 ENCOUNTER — Encounter (INDEPENDENT_AMBULATORY_CARE_PROVIDER_SITE_OTHER): Payer: Self-pay | Admitting: Otolaryngology

## 2020-11-08 ENCOUNTER — Ambulatory Visit (INDEPENDENT_AMBULATORY_CARE_PROVIDER_SITE_OTHER): Payer: 59 | Admitting: Otolaryngology

## 2020-11-08 ENCOUNTER — Other Ambulatory Visit: Payer: Self-pay

## 2020-11-08 VITALS — Temp 97.2°F

## 2020-11-08 DIAGNOSIS — J310 Chronic rhinitis: Secondary | ICD-10-CM | POA: Diagnosis not present

## 2020-11-08 DIAGNOSIS — R220 Localized swelling, mass and lump, head: Secondary | ICD-10-CM | POA: Diagnosis not present

## 2020-11-08 NOTE — Progress Notes (Signed)
HPI: [name] [name] is a 60 y.o. male who presents is referred by PCP for evaluation of swelling over the bridge of the nose.  He describes mostly swelling over the bridge of the nose and between the eyes which is worse in the mornings when he first wakes up it tends to get better during the day.  He does not really notice trouble breathing through his nose.  He has history of obstructive sleep apnea but does not use CPAP regularly.  Denies any history of allergies although he sneezes occasionally.  He is not having trouble breathing through his nose.  The swelling is not tender..  Past Medical History:  Diagnosis Date  . Arthritis    "maybe in my hands" (10/31/2016)  . Atrial flutter ([place])   . CHEST PAIN-PRECORDIAL 05/25/2009  . ERECTILE DYSFUNCTION, ORGANIC 01/16/2010  . GERD (gastroesophageal reflux disease)   . HYPERLIPIDEMIA 12/15/2007  . HYPERTENSION 12/15/2007  . HYPOKALEMIA 12/15/2007  . Internal hemorrhoids   . OSA (obstructive sleep apnea) 09/22/2019  . Persistent atrial fibrillation ([place])    a. prior ablation/flecainide; placed on Tikosyn 07/2017.  Marland Kitchen POSTURAL LIGHTHEADEDNESS 07/06/2008  . Sinus bradycardia   . Visit for monitoring Tikosyn therapy 07/03/2017   Past Surgical History:  Procedure Laterality Date  . ATRIAL FIBRILLATION ABLATION N/A 08/13/2011   Procedure: ATRIAL FIBRILLATION ABLATION;  Surgeon: Thompson Grayer, MD;  Location: [facility] CATH LAB;  Service: Cardiovascular;  Laterality: N/A;  . ATRIAL FIBRILLATION ABLATION  10/31/2016  . ATRIAL FIBRILLATION ABLATION N/A 10/31/2016   Procedure: Atrial Fibrillation Ablation;  Surgeon: Thompson Grayer, MD;  Location: [place] CV LAB;  Service: Cardiovascular;  Laterality: N/A;  . ATRIAL FLUTTER ABLATION N/A 12/27/2011   CTI repeat ablation by Dr Rayann Heman  . CARDIOVERSION  11/21/2011   Procedure: CARDIOVERSION;  Surgeon: Larey Dresser, MD;  Location: [place];  Service: Cardiovascular;  Laterality: N/A;  . CARDIOVERSION N/A 12/02/2016    Procedure: CARDIOVERSION;  Surgeon: [name], MD;  Location: [place];  Service: Cardiovascular;  Laterality: N/A;  . CHALAZION EXCISION Right   . ELBOW SURGERY Right early 1980s   bone spur/chip; "opened me up"  . INGUINAL HERNIA REPAIR Right 11/2006  . TEE WITHOUT CARDIOVERSION  08/12/2011   Procedure: TRANSESOPHAGEAL ECHOCARDIOGRAM (TEE);  Surgeon: Fay Records, MD;  Location: [facility] ENDOSCOPY;  Service: Cardiovascular;  Laterality: N/A;  . TEE WITHOUT CARDIOVERSION N/A 10/30/2016   Procedure: TRANSESOPHAGEAL ECHOCARDIOGRAM (TEE);  Surgeon: Josue Hector, MD;  Location: [facility] ENDOSCOPY;  Service: Cardiovascular;  Laterality: N/A;  . TEE WITHOUT CARDIOVERSION N/A 12/02/2016   Procedure: TRANSESOPHAGEAL ECHOCARDIOGRAM (TEE);  Surgeon: [name], MD;  Location: [facility] ENDOSCOPY;  Service: Cardiovascular;  Laterality: N/A;   Social History   Socioeconomic History  . Marital status: Married    Spouse name: Not on file  . Number of children: 2  . Years of education: Not on file  . Highest education level: Not on file  Occupational History  . Occupation: SECURITY    Employer: [employer]  Tobacco Use  . Smoking status: Former Smoker    Packs/day: 0.10    Years: 15.00    Pack years: 1.50    Types: Cigarettes    Quit date: 12/17/2010    Years since quitting: 9.9  . Smokeless tobacco: Never Used  . Tobacco comment: Smoked once in a while. not a daily smoker  Vaping Use  . Vaping Use: Never used  Substance and Sexual Activity  . Alcohol  use: Yes    Comment: "1 glass of wine/month maybe" (10/31/2016)  . Drug use: No  . Sexual activity: Not Currently  Other Topics Concern  . Not on file  Social History Narrative   Pt lives in [place] with spouse. 2 grown children.   Works at [employer] in [place].   Social Determinants of Health   Financial Resource Strain: Not on file  Food Insecurity: Not on file  Transportation Needs: Not on file  Physical  Activity: Not on file  Stress: Not on file  Social Connections: Not on file   Family History  Problem Relation Age of Onset  . Stroke Mother   . Heart disease Sister   . Thyroid disease Sister   . Sleep apnea Sister   . Other Father        deceased stomach hemorrhage  . Anesthesia problems Neg Hx    Allergies  Allergen Reactions  . Ibuprofen Palpitations and Other (See Comments)    Rapid heart beat   Prior to Admission medications   Medication Sig Start Date End Date Taking? Authorizing Provider  Arginine 500 MG CAPS Take 500 mg by mouth daily.     [provider]  Ascorbic Acid (VITAMIN C) 1000 MG tablet Take 1,000 mg by mouth daily.     [provider]  atorvastatin (LIPITOR) 20 MG tablet Take 1 tablet by mouth once daily 10/17/20   Biagio Borg, MD  cholecalciferol (VITAMIN D3) 10 MCG (400 UNIT) TABS tablet Take 400 Units by mouth daily.     [provider]  diltiazem (CARDIZEM CD) 120 MG 24 hr capsule Take 1 capsule (120 mg total) by mouth daily. 12/15/19   Biagio Borg, MD  diltiazem (CARDIZEM) 30 MG tablet Take 1 tablet every 4 hours AS NEEDED for heart rate over 100 03/26/18   Sherran Needs, NP  dofetilide (TIKOSYN) 250 MCG capsule TAKE 1 CAPSULE BY MOUTH  TWICE DAILY 10/11/20   Sherran Needs, NP  doxazosin (CARDURA) 4 MG tablet TAKE 1 TABLET BY MOUTH AT BEDTIME 10/17/20   Biagio Borg, MD  ELIQUIS 5 MG TABS tablet Take 1 tablet by mouth twice daily 05/29/20   Sherran Needs, NP  fexofenadine (ALLEGRA) 180 MG tablet Take 1 tablet (180 mg total) by mouth daily. 12/14/19 12/13/20  Biagio Borg, MD  irbesartan (AVAPRO) 300 MG tablet Take 1 tablet (300 mg total) by mouth daily. 09/22/20   Biagio Borg, MD  Lysine HCl 500 MG TABS Take 500 mg by mouth daily.     [provider]  Multiple Vitamins-Minerals (MULTIVITAL) tablet Take 1 tablet by mouth daily.    [provider]  nitroGLYCERIN (NITROSTAT) 0.4 MG SL tablet Place 1 tablet  (0.4 mg total) under the tongue every 5 (five) minutes as needed for chest pain. 01/13/20   Sherran Needs, NP  triamcinolone (NASACORT) 55 MCG/ACT AERO nasal inhaler Place 2 sprays into the nose daily. 12/14/19   Biagio Borg, MD     Positive ROS: Otherwise negative  All other systems have been reviewed and were otherwise negative with the exception of those mentioned in the HPI and as above.  Physical Exam: Constitutional: Alert, well-appearing, no acute distress Ears: External ears without lesions or tenderness. Ear canals are clear bilaterally with intact, clear TMs.  Nasal: External nose without lesions.  In the region where he says that he has swelling the skin and subcutaneous tissue was soft to  palpation with no erythema.  No palpable masses.  The underlying bone is not really distorted.  Intranasal exam reveals minimal deformity of the septum with clear mucous membranes within the nasal cavity.  On nasal endoscopy there are no polyps noted in the middle meatus regions are clear bilaterally.  Of note he had a small submucosal cyst in the nasopharynx on the right side just above the eustachian tube opening.  The nasopharynx was otherwise clear. Oral: Lips and gums without lesions. Tongue and palate mucosa without lesions. Posterior oropharynx clear. Neck: No palpable adenopathy or masses Respiratory: Breathing comfortably  Skin: No facial/neck lesions or rash noted.  Procedures  Assessment: Questionable etiology of swelling over the bridge of nose.  On clinical exam there are no bony changes and no intranasal abnormalities noted and no polyps.  Mucous membranes were clear intranasally although he does have mild rhinitis.  No signs of infection.  No evidence of neoplasm.  Questionable allergies.  Plan: Reassured patient of normal clinical examination and I am not sure what is causing the swelling.  Discussed with him that everybody will have some swelling in their face when they wake  up in the morning and perhaps elevating the head of bed at night may help reduce swelling. I did suggest perhaps trying antihistamine and allergy treatment with Claritin 10 mg daily for couple weeks and Flonase 2 sprays at night regularly for the next month. He may also use his nasal CPAP machine that may help him rest better at night.  But discussed with him that there is no evidence of any intranasal or sinus abnormality noted on clinical exam and I cannot identify any external nasal problems on external nasal exam.   Radene Journey, MD   CC:

## 2020-11-16 ENCOUNTER — Encounter: Payer: Self-pay | Admitting: Gastroenterology

## 2020-11-20 ENCOUNTER — Other Ambulatory Visit (HOSPITAL_COMMUNITY): Payer: Self-pay

## 2020-11-20 ENCOUNTER — Other Ambulatory Visit: Payer: Self-pay | Admitting: Internal Medicine

## 2020-11-20 MED ORDER — ELIQUIS 5 MG PO TABS: 1.0000 | ORAL_TABLET | Freq: Two times a day (BID) | ORAL | 2 refills | Status: DC

## 2020-11-20 NOTE — Telephone Encounter (Signed)
Please refill as per office routine med refill policy (all routine meds refilled for 3 mo or monthly per pt preference up to one year from last visit, then month to month grace period for 3 mo, then further med refills will have to be denied)  

## 2020-11-22 ENCOUNTER — Other Ambulatory Visit: Payer: Self-pay

## 2020-11-24 ENCOUNTER — Other Ambulatory Visit: Payer: Self-pay

## 2020-11-30 ENCOUNTER — Telehealth: Payer: Self-pay | Admitting: Internal Medicine

## 2020-11-30 NOTE — Telephone Encounter (Signed)
   Patient has no medication remaining diltiazem (CARDIZEM CD) 120 MG 24 hr capsule  irbesartan (AVAPRO) 300 MG tablet   [place] ([place]), [place] - [address]

## 2020-12-01 ENCOUNTER — Other Ambulatory Visit: Payer: Self-pay

## 2020-12-01 MED ORDER — DILTIAZEM HCL ER COATED BEADS 120 MG PO CP24
120.0000 mg | ORAL_CAPSULE | Freq: Every day | ORAL | 3 refills | Status: DC
Start: 2020-12-01 — End: 2021-12-11

## 2020-12-01 MED ORDER — IRBESARTAN 300 MG PO TABS: 300.0000 mg | ORAL_TABLET | Freq: Every day | ORAL | 1 refills | Status: DC

## 2020-12-01 NOTE — Telephone Encounter (Signed)
Reviewed chart pt is up-to-date sent refills to pof.Marland KitchenChryl Heck,

## 2020-12-12 ENCOUNTER — Ambulatory Visit: Payer: 59 | Admitting: Gastroenterology

## 2020-12-15 ENCOUNTER — Encounter: Payer: Self-pay | Admitting: Gastroenterology

## 2020-12-15 ENCOUNTER — Ambulatory Visit (INDEPENDENT_AMBULATORY_CARE_PROVIDER_SITE_OTHER): Payer: 59 | Admitting: Gastroenterology

## 2020-12-15 ENCOUNTER — Telehealth: Payer: Self-pay

## 2020-12-15 VITALS — BP 110/60 | HR 59 | Ht 72.0 in | Wt 195.0 lb

## 2020-12-15 DIAGNOSIS — Z860100 Personal history of colon polyps, unspecified: Secondary | ICD-10-CM | POA: Insufficient documentation

## 2020-12-15 DIAGNOSIS — R1084 Generalized abdominal pain: Secondary | ICD-10-CM | POA: Insufficient documentation

## 2020-12-15 DIAGNOSIS — Z7901 Long term (current) use of anticoagulants: Secondary | ICD-10-CM | POA: Diagnosis not present

## 2020-12-15 MED ORDER — SUTAB 1479-225-188 MG PO TABS: 1.0000 | ORAL_TABLET | ORAL | 0 refills | Status: DC

## 2020-12-15 MED ORDER — HYOSCYAMINE SULFATE 0.125 MG SL SUBL: 0.1250 mg | SUBLINGUAL_TABLET | Freq: Four times a day (QID) | SUBLINGUAL | 1 refills | Status: DC | PRN

## 2020-12-15 NOTE — Progress Notes (Signed)
12/15/2020 [name] [name] [id number] 07/12/1960   HISTORY OF PRESENT ILLNESS: This is a pleasant 60 year old male is a patient of Dr. Doyne Keel.  He is here today to discuss colonoscopy.  His last colonoscopy was in June 2017 as listed below.  Repeat was recommended at a 5-year interval.  While he is here he also mentions that he has been having issues with intermittent abdominal pain.  Seems rather generalized, is in his mid to lower abdomen.  He says it occurs about once or twice a week and has been going on for about the past year or so.  Has not worsened or increased in frequency.  It does sometimes reach a 7 out of 10 on the pain scale, however.  When it occurs it is rather short-lived, not lasting hours at a time or anything.  Says that it helps if he drinks water and sometimes is relieved by having a bowel movement.  He says that his bowel movements are normal even around the time that he is experiencing this abdominal pain.  He denies any constipation or straining or any diarrhea.  He denies any rectal bleeding.  He is on Eliquis for history of atrial fibrillation and follows with Dr. Rayann Heman and one of the nurse practitioners with the atrial fibrillation clinic.  Basic labs unremarkable by his PCP back in April.  Colonoscopy 11/2015:  - The perianal and digital rectal examinations were normal. - A 4 mm polyp was found in the transverse colon. The polyp was sessile. The polyp was removed with a cold snare. Resection and retrieval were complete. - A 4 mm polyp was found in the sigmoid colon. The polyp was sessile. The polyp was removed with a cold snare. Resection and retrieval were complete. - A few small-mouthed diverticula were found in the transverse colon and ascending colon. - Non-bleeding internal hemorrhoids were found during retroflexion. - The terminal ileum appeared normal. - The exam was otherwise without abnormality.  Surgical [P], transverse, sigmoid, polyps(2) -  TUBULAR ADENOMA AND SESSILE SERRATED POLYP/ADENOMA. - NO HIGH GRADE DYSPLASIA OR MALIGNANCY IDENTIFIED.  Past Medical History:  Diagnosis Date   Arthritis    "maybe in my hands" (10/31/2016)   Atrial flutter ([place])    CHEST PAIN-PRECORDIAL 05/25/2009   ERECTILE DYSFUNCTION, ORGANIC 01/16/2010   GERD (gastroesophageal reflux disease)    HYPERLIPIDEMIA 12/15/2007   HYPERTENSION 12/15/2007   HYPOKALEMIA 12/15/2007   Internal hemorrhoids    OSA (obstructive sleep apnea) 09/22/2019   Persistent atrial fibrillation ([place])    a. prior ablation/flecainide; placed on Tikosyn 07/2017.   POSTURAL LIGHTHEADEDNESS 07/06/2008   Sinus bradycardia    Visit for monitoring Tikosyn therapy 07/03/2017   Past Surgical History:  Procedure Laterality Date   ATRIAL FIBRILLATION ABLATION N/A 08/13/2011   Procedure: ATRIAL FIBRILLATION ABLATION;  Surgeon: Thompson Grayer, MD;  Location: [facility] CATH LAB;  Service: Cardiovascular;  Laterality: N/A;   ATRIAL FIBRILLATION ABLATION  10/31/2016   ATRIAL FIBRILLATION ABLATION N/A 10/31/2016   Procedure: Atrial Fibrillation Ablation;  Surgeon: Thompson Grayer, MD;  Location: [place] CV LAB;  Service: Cardiovascular;  Laterality: N/A;   ATRIAL FLUTTER ABLATION N/A 12/27/2011   CTI repeat ablation by Dr Rayann Heman   CARDIOVERSION  11/21/2011   Procedure: CARDIOVERSION;  Surgeon: Larey Dresser, MD;  Location: [place];  Service: Cardiovascular;  Laterality: N/A;   CARDIOVERSION N/A 12/02/2016   Procedure: CARDIOVERSION;  Surgeon: [name], MD;  Location: [place];  Service: Cardiovascular;  Laterality:  N/A;   CHALAZION EXCISION Right    ELBOW SURGERY Right early 1980s   bone spur/chip; "opened me up"   INGUINAL HERNIA REPAIR Right 11/2006   TEE WITHOUT CARDIOVERSION  08/12/2011   Procedure: TRANSESOPHAGEAL ECHOCARDIOGRAM (TEE);  Surgeon: Fay Records, MD;  Location: [facility] ENDOSCOPY;  Service: Cardiovascular;  Laterality: N/A;   TEE WITHOUT CARDIOVERSION N/A 10/30/2016   Procedure:  TRANSESOPHAGEAL ECHOCARDIOGRAM (TEE);  Surgeon: Josue Hector, MD;  Location: [facility] ENDOSCOPY;  Service: Cardiovascular;  Laterality: N/A;   TEE WITHOUT CARDIOVERSION N/A 12/02/2016   Procedure: TRANSESOPHAGEAL ECHOCARDIOGRAM (TEE);  Surgeon: [name], MD;  Location: [facility] ENDOSCOPY;  Service: Cardiovascular;  Laterality: N/A;    reports that he quit smoking about 10 years ago. His smoking use included cigarettes. He has a 1.50 pack-year smoking history. He has never used smokeless tobacco. He reports current alcohol use. He reports that he does not use drugs. family history includes Heart disease in his sister; Other in his father; Sleep apnea in his sister; Stroke in his mother; Thyroid disease in his sister. Allergies  Allergen Reactions   Ibuprofen Palpitations and Other (See Comments)    Rapid heart beat      Outpatient Encounter Medications as of 12/15/2020  Medication Sig   apixaban (ELIQUIS) 5 MG TABS tablet Take 1 tablet (5 mg total) by mouth 2 (two) times daily.   Arginine 500 MG CAPS Take 500 mg by mouth daily.    Ascorbic Acid (VITAMIN C) 1000 MG tablet Take 1,000 mg by mouth daily.    atorvastatin (LIPITOR) 20 MG tablet Take 1 tablet by mouth once daily   cholecalciferol (VITAMIN D3) 10 MCG (400 UNIT) TABS tablet Take 400 Units by mouth daily.    diltiazem (CARDIZEM CD) 120 MG 24 hr capsule Take 1 capsule (120 mg total) by mouth daily.   diltiazem (CARDIZEM) 30 MG tablet Take 1 tablet every 4 hours AS NEEDED for heart rate over 100   dofetilide (TIKOSYN) 250 MCG capsule TAKE 1 CAPSULE BY MOUTH  TWICE DAILY   doxazosin (CARDURA) 4 MG tablet TAKE 1 TABLET BY MOUTH AT BEDTIME   irbesartan (AVAPRO) 300 MG tablet Take 1 tablet (300 mg total) by mouth daily.   Lysine HCl 500 MG TABS Take 500 mg by mouth daily.    Multiple Vitamins-Minerals (MULTIVITAL) tablet Take 1 tablet by mouth daily.   nitroGLYCERIN (NITROSTAT) 0.4 MG SL tablet Place 1 tablet (0.4 mg total) under the tongue  every 5 (five) minutes as needed for chest pain.   triamcinolone (NASACORT) 55 MCG/ACT AERO nasal inhaler Place 2 sprays into the nose daily.   fexofenadine (ALLEGRA) 180 MG tablet Take 1 tablet (180 mg total) by mouth daily.   No facility-administered encounter medications on file as of 12/15/2020.     REVIEW OF SYSTEMS  : All other systems reviewed and negative except where noted in the History of Present Illness.   PHYSICAL EXAM: BP 110/60   Pulse (!) 59   Ht 6' (1.829 m)   Wt 195 lb (88.5 kg)   BMI 26.45 kg/m  General: Well developed AA male in no acute distress Head: Normocephalic and atraumatic Eyes:  Sclerae anicteric, conjunctiva pink. Ears: Normal auditory acuity Lungs: Clear throughout to auscultation; no W/R/R. Heart: Regular rate and rhythm; no M/R/G. Abdomen: Soft, non-distended.  BS present.  Non-tender. Rectal:  Will be done at the time of colonoscopy. Musculoskeletal: Symmetrical with no gross deformities  Skin: No lesions on visible extremities  Extremities: No edema  Neurological: Alert oriented x 4, grossly non-focal Psychological:  Alert and cooperative. Normal mood and affect  ASSESSMENT AND PLAN: *Personal history of colon polyps:  Had adenomatous colon polyps in 11/2015 with repeat recommended in 5 years.  Will schedule with Dr. Havery Moros. *Intermittent generalized abdominal pain: Occurs with once or twice a week, fairly short-lived, improved by drinking water and sometimes improves with a bowel movement.  Likely intestinal spasm/cramping.  We will try Levsin 0.125 mg every 6 hours as needed.  New prescription sent to pharmacy *Chronic anticoagulation with Eliquis for history of atrial fibrillation:  Will hold Eliquis for 2 days prior to endoscopic procedures - will instruct when and how to resume after procedure. Benefits and risks of procedure explained including risks of bleeding, perforation, infection, missed lesions, reactions to medications and possible  need for hospitalization and surgery for complications. Additional rare but real risk of stroke or other vascular clotting events off Eliquis also explained and need to seek urgent help if any signs of these problems occur. Will communicate by phone or EMR with patient's prescribing provider, Dr. Rayann Heman, to confirm that holding Eliquis is reasonable in this case.     CC:  Biagio Borg, MD

## 2020-12-15 NOTE — Telephone Encounter (Signed)
    Patient Name: [name] [name]  DOB: 03/16/1961 MRN: [id number]  Primary Cardiologist: Thompson Grayer, MD  Chart reviewed as part of pre-operative protocol coverage.   Per pharmacy recommendations, patient an hold eliquis 2 days prior to his upcoming colonoscopy with plans to restart as soon as he is cleared to do so by his gastroenterologist.   I will route this recommendation to the requesting party via [facility] fax function and remove from pre-op pool.  Please call with questions.  Abigail Butts, PA-C 12/15/2020, 4:02 PM

## 2020-12-15 NOTE — Progress Notes (Signed)
Agree with assessment and plan as outlined.  

## 2020-12-15 NOTE — Telephone Encounter (Signed)
Patient with diagnosis of afib on Eliquis for anticoagulation.    Procedure: colonoscopy Date of procedure: 01/17/21  CHA2DS2-VASc Score = 1  This indicates a 0.6% annual risk of stroke. The patient's score is based upon: CHF History: No HTN History: Yes Diabetes History: No Stroke History: No Vascular Disease History: No Age Score: 0 Gender Score: 0   CrCl 48mL/min Platelet count 223K  Per office protocol, patient can hold Eliquis for 2 days prior to procedure as requested.

## 2020-12-15 NOTE — Telephone Encounter (Signed)
[facility] Pre-operative Risk Assessment     Request for surgical clearance:     Endoscopy Procedure  What type of surgery is being performed?     Colonoscopy  When is this surgery scheduled?     01/17/2021  What type of clearance is required ?   Pharmacy  Are there any medications that need to be held prior to surgery and how long? Eliquis starting 2 days prior  Practice name and name of physician performing surgery?      [facility]  What is your office phone and fax number?      Phone- [phone]  [phone]  Anesthesia type (None, local, MAC, general) ?       MAC

## 2020-12-15 NOTE — Patient Instructions (Addendum)
If you are age 61 or older, your body mass index should be between 23-30. Your Body mass index is 26.45 kg/m. If this is out of the aforementioned range listed, please consider follow up with your Primary Care Provider. __________________________________________________________  The [place] [facility] providers would like to encourage you to use [facility] to communicate with providers for non-urgent requests or questions.  Due to long hold times on the telephone, sending your provider a message by [facility] may be a faster and more efficient way to get a response.  Please allow 48 business hours for a response.  Please remember that this is for non-urgent requests.   You have been scheduled for a colonoscopy. Please follow written instructions given to you at your visit today.  Please pick up your prep supplies at the pharmacy within the next 1-3 days. If you use inhalers (even only as needed), please bring them with you on the day of your procedure.  You will be contacted by our office prior to your procedure for directions on holding your Eliquis.  If you do not hear from our office 1 week prior to your scheduled procedure, please call [phone] to discuss.   START Hyoscyamine 1 tablet every 6 hours as needed for abdominal pain  Follow up pending your Colonoscopy or as needed.  Thank you for entrusting me with your care and choosing [facility].  Alonza Bogus, PA-C

## 2020-12-18 ENCOUNTER — Other Ambulatory Visit: Payer: Self-pay | Admitting: Internal Medicine

## 2020-12-18 ENCOUNTER — Other Ambulatory Visit (HOSPITAL_COMMUNITY): Payer: Self-pay

## 2020-12-18 MED ORDER — NITROGLYCERIN 0.4 MG SL SUBL
0.4000 mg | SUBLINGUAL_TABLET | SUBLINGUAL | 0 refills | Status: AC | PRN
Start: 2020-12-18 — End: ?

## 2020-12-18 NOTE — Telephone Encounter (Signed)
Please refill as per office routine med refill policy (all routine meds refilled for 3 mo or monthly per pt preference up to one year from last visit, then month to month grace period for 3 mo, then further med refills will have to be denied)  

## 2020-12-26 MED ORDER — PLENVU 140 G PO SOLR: 1.0000 | ORAL | 0 refills | Status: DC

## 2020-12-26 MED ORDER — HYOSCYAMINE SULFATE 0.125 MG SL SUBL: 0.1250 mg | SUBLINGUAL_TABLET | Freq: Four times a day (QID) | SUBLINGUAL | 1 refills | Status: DC | PRN

## 2020-12-26 NOTE — Addendum Note (Signed)
Addended by: Aleatha Borer on: 12/26/2020 04:34 PM   Modules accepted: Orders

## 2020-12-26 NOTE — Telephone Encounter (Signed)
Patient has been contacted about stopping his Eliquis. He is aware that Cardiology has approved for him to stop 2 days prior to his procedure.   While on the phone patient asked about changing his prep. Plenvu has been sent to the pharmacy and he is aware that new instruction will be sent to him through [place].

## 2020-12-27 DIAGNOSIS — M545 Low back pain, unspecified: Secondary | ICD-10-CM | POA: Diagnosis not present

## 2020-12-27 DIAGNOSIS — M25551 Pain in right hip: Secondary | ICD-10-CM | POA: Diagnosis not present

## 2021-01-01 ENCOUNTER — Other Ambulatory Visit: Payer: Self-pay | Admitting: Nurse Practitioner

## 2021-01-17 ENCOUNTER — Encounter: Payer: Self-pay | Admitting: Gastroenterology

## 2021-01-17 ENCOUNTER — Other Ambulatory Visit: Payer: Self-pay

## 2021-01-17 ENCOUNTER — Ambulatory Visit: Payer: 59 | Admitting: Gastroenterology

## 2021-01-17 VITALS — BP 123/62 | HR 116 | Temp 97.7°F | Resp 12 | Ht 72.0 in | Wt 195.0 lb

## 2021-01-17 DIAGNOSIS — Z86010 Personal history of colonic polyps: Secondary | ICD-10-CM

## 2021-01-17 MED ORDER — SODIUM CHLORIDE 0.9 % IV SOLN: 500.0000 mL | Freq: Once | INTRAVENOUS | Status: DC

## 2021-01-17 NOTE — Progress Notes (Signed)
VS taken by AS

## 2021-01-17 NOTE — Progress Notes (Signed)
[facility] History and Physical   Primary Care Physician:  Biagio Borg, MD   Reason for Procedure:   History of colon polyps, abdominal pains  Plan:    Colonoscopy  Patient came into the endoscopy suite for colonoscopy today. HR noted to be in 110s to 120s today. BP stable. He really doesn't have much symptoms with this, no chest pains, no shortness of breath, states he takes diltiazem PRN for when this happens, usually once a month but can be variable, has had breakthrough AF at times. Actually took a dose of diltiazem this AM as he could feel he is in AF with RVR as of this AM. Dewaine Conger Tikosyn otherwise, last seen by cardiology in February. Discussed options with patient, don't think we should proceed with his colonoscopy with his AF not being controlled. Will give bag of IV fluids, he is otherwise asymptomatic - no chest pains or shortness of breath. He should resume his Eliquis today.   Spoke with [name] who manages his AF and discussed his case. She recommends taking another dose of Diltazem when he gets home and again in the AM and they will call him in the AM. If he feels poorly in the interim with rapid HR he needs to go to the ED, but seems to be tolerating it right now, he states this happens from time to time. He should keep an eye on his HR and BP today, await course following another dose of diltiazem. Will follow up with cardiology for evaluation again prior to rescheduling his colonoscopy. Patient otherwise feels okay and agrees with the plan, will contact us when he is ready to reschedule. He agreed with the plan, all questions answered, otherwise feels okay, no chest pains or shortness of breath. Wife in agreement, she will drive home, they understand the plan.  Jolly Mango, MD [facility]     HPI: [name] [name] is a 60 y.o. male with history of SSP and TA in 11/2015, here for surveillance colonoscopy. Also having some lower to mid  abdominal pain treated recently with  Levsin. On Eliquis for history of AF, has held for 2 days. He reports feeling fast HR today, took some diltiazem '30mg'$  PRN at home this AM. HR pre-procedure around 120, BP stable. He denies any chest pains, otherwise feels okay. States this happens about once per few months or so, last saw cardiology around Feb. Is on Tikosyn. No chest pains or shortness of breath.   Past Medical History:  Diagnosis Date   Atrial flutter ([place])    CHEST PAIN-PRECORDIAL 05/25/2009   ERECTILE DYSFUNCTION, ORGANIC 01/16/2010   GERD (gastroesophageal reflux disease)    HYPERLIPIDEMIA 12/15/2007   HYPERTENSION 12/15/2007   HYPOKALEMIA 12/15/2007   Internal hemorrhoids    OSA (obstructive sleep apnea) 09/22/2019   Persistent atrial fibrillation ([place])    a. prior ablation/flecainide; placed on Tikosyn 07/2017.   POSTURAL LIGHTHEADEDNESS 07/06/2008   Sinus bradycardia    Sleep apnea    Visit for monitoring Tikosyn therapy 07/03/2017    Past Surgical History:  Procedure Laterality Date   ATRIAL FIBRILLATION ABLATION N/A 08/13/2011   Procedure: ATRIAL FIBRILLATION ABLATION;  Surgeon: Thompson Grayer, MD;  Location: [facility] CATH LAB;  Service: Cardiovascular;  Laterality: N/A;   ATRIAL FIBRILLATION ABLATION  10/31/2016   ATRIAL FIBRILLATION ABLATION N/A 10/31/2016   Procedure: Atrial Fibrillation Ablation;  Surgeon: Thompson Grayer, MD;  Location: [facility] CV LAB;  Service: Cardiovascular;  Laterality: N/A;   ATRIAL  FLUTTER ABLATION N/A 12/27/2011   CTI repeat ablation by Dr Rayann Heman   CARDIOVERSION  11/21/2011   Procedure: CARDIOVERSION;  Surgeon: Larey Dresser, MD;  Location: [facility];  Service: Cardiovascular;  Laterality: N/A;   CARDIOVERSION N/A 12/02/2016   Procedure: CARDIOVERSION;  Surgeon: [name], MD;  Location: [facility] ENDOSCOPY;  Service: Cardiovascular;  Laterality: N/A;   CHALAZION EXCISION Right    ELBOW SURGERY Right early 1980s   bone spur/chip; "opened me up"    INGUINAL HERNIA REPAIR Right 11/2006   TEE WITHOUT CARDIOVERSION  08/12/2011   Procedure: TRANSESOPHAGEAL ECHOCARDIOGRAM (TEE);  Surgeon: Fay Records, MD;  Location: [facility] ENDOSCOPY;  Service: Cardiovascular;  Laterality: N/A;   TEE WITHOUT CARDIOVERSION N/A 10/30/2016   Procedure: TRANSESOPHAGEAL ECHOCARDIOGRAM (TEE);  Surgeon: Josue Hector, MD;  Location: [facility] ENDOSCOPY;  Service: Cardiovascular;  Laterality: N/A;   TEE WITHOUT CARDIOVERSION N/A 12/02/2016   Procedure: TRANSESOPHAGEAL ECHOCARDIOGRAM (TEE);  Surgeon: [name], MD;  Location: [facility] ENDOSCOPY;  Service: Cardiovascular;  Laterality: N/A;    Prior to Admission medications   Medication Sig Start Date End Date Taking? Authorizing Provider  Arginine 500 MG CAPS Take 500 mg by mouth daily.    Yes [provider]  Ascorbic Acid (VITAMIN C) 1000 MG tablet Take 1,000 mg by mouth daily.    Yes [provider]  atorvastatin (LIPITOR) 20 MG tablet Take 1 tablet by mouth once daily 12/18/20  Yes Biagio Borg, MD  diltiazem (CARDIZEM CD) 120 MG 24 hr capsule Take 1 capsule (120 mg total) by mouth daily. 12/01/20  Yes Biagio Borg, MD  diltiazem (CARDIZEM) 30 MG tablet Take 1 tablet every 4 hours AS NEEDED for heart rate over 100 03/26/18  Yes Sherran Needs, NP  dofetilide (TIKOSYN) 250 MCG capsule TAKE 1 CAPSULE BY MOUTH  TWICE DAILY 10/11/20  Yes Sherran Needs, NP  doxazosin (CARDURA) 4 MG tablet TAKE 1 TABLET BY MOUTH AT BEDTIME 12/18/20  Yes Biagio Borg, MD  fexofenadine (ALLEGRA) 180 MG tablet Take 1 tablet (180 mg total) by mouth daily. 12/14/19 01/17/21 Yes Biagio Borg, MD  irbesartan (AVAPRO) 300 MG tablet Take 1 tablet (300 mg total) by mouth daily. 12/01/20  Yes Biagio Borg, MD  Multiple Vitamins-Minerals (MULTIVITAL) tablet Take 1 tablet by mouth daily.   Yes [provider]  triamcinolone (NASACORT) 55 MCG/ACT AERO nasal inhaler Place 2 sprays into the nose daily. 12/14/19  Yes Biagio Borg, MD   apixaban (ELIQUIS) 5 MG TABS tablet Take 1 tablet (5 mg total) by mouth 2 (two) times daily. Appointment Required For Further Refills [phone] 01/01/21   Sherran Needs, NP  cholecalciferol (VITAMIN D3) 10 MCG (400 UNIT) TABS tablet Take 400 Units by mouth daily.     [provider]  hyoscyamine (LEVSIN SL) 0.125 MG SL tablet Place 1 tablet (0.125 mg total) under the tongue every 6 (six) hours as needed (Abdominal Pain). 12/26/20   Zehr, Laban Emperor, PA-C  Lysine HCl 500 MG TABS Take 500 mg by mouth daily.     [provider]  nitroGLYCERIN (NITROSTAT) 0.4 MG SL tablet Place 1 tablet (0.4 mg total) under the tongue every 5 (five) minutes as needed for chest pain. 12/18/20   Sherran Needs, NP    Current Outpatient Medications  Medication Sig Dispense Refill   Arginine 500 MG CAPS Take 500 mg by mouth daily.      Ascorbic Acid (VITAMIN C)  1000 MG tablet Take 1,000 mg by mouth daily.      atorvastatin (LIPITOR) 20 MG tablet Take 1 tablet by mouth once daily 90 tablet 2   diltiazem (CARDIZEM CD) 120 MG 24 hr capsule Take 1 capsule (120 mg total) by mouth daily. 90 capsule 3   diltiazem (CARDIZEM) 30 MG tablet Take 1 tablet every 4 hours AS NEEDED for heart rate over 100 45 tablet 2   dofetilide (TIKOSYN) 250 MCG capsule TAKE 1 CAPSULE BY MOUTH  TWICE DAILY 180 capsule 1   doxazosin (CARDURA) 4 MG tablet TAKE 1 TABLET BY MOUTH AT BEDTIME 90 tablet 2   fexofenadine (ALLEGRA) 180 MG tablet Take 1 tablet (180 mg total) by mouth daily. 30 tablet 2   irbesartan (AVAPRO) 300 MG tablet Take 1 tablet (300 mg total) by mouth daily. 90 tablet 1   Multiple Vitamins-Minerals (MULTIVITAL) tablet Take 1 tablet by mouth daily.     triamcinolone (NASACORT) 55 MCG/ACT AERO nasal inhaler Place 2 sprays into the nose daily. 1 Inhaler 12   apixaban (ELIQUIS) 5 MG TABS tablet Take 1 tablet (5 mg total) by mouth 2 (two) times daily. Appointment Required For Further Refills [phone] 60 tablet 0    cholecalciferol (VITAMIN D3) 10 MCG (400 UNIT) TABS tablet Take 400 Units by mouth daily.      hyoscyamine (LEVSIN SL) 0.125 MG SL tablet Place 1 tablet (0.125 mg total) under the tongue every 6 (six) hours as needed (Abdominal Pain). 15 tablet 1   Lysine HCl 500 MG TABS Take 500 mg by mouth daily.      nitroGLYCERIN (NITROSTAT) 0.4 MG SL tablet Place 1 tablet (0.4 mg total) under the tongue every 5 (five) minutes as needed for chest pain. 30 tablet 0   Current Facility-Administered Medications  Medication Dose Route Frequency Provider Last Rate Last Admin   0.9 %  sodium chloride infusion  500 mL Intravenous Once Gwendolin Briel, Carlota Raspberry, MD        Allergies as of 01/17/2021 - Review Complete 01/17/2021  Allergen Reaction Noted   Ibuprofen Palpitations and Other (See Comments) 12/15/2007    Family History  Problem Relation Age of Onset   Stroke Mother    Other Father        deceased stomach hemorrhage   Heart disease Sister    Thyroid disease Sister    Sleep apnea Sister    Anesthesia problems Neg Hx    Esophageal cancer Neg Hx    Colon cancer Neg Hx    Rectal cancer Neg Hx    Stomach cancer Neg Hx     Social History   Socioeconomic History   Marital status: Married    Spouse name: Not on file   Number of children: 2   Years of education: Not on file   Highest education level: Not on file  Occupational History   Occupation: SECURITY    Employer: [employer]  Tobacco Use   Smoking status: Former    Packs/day: 0.10    Years: 15.00    Pack years: 1.50    Types: Cigarettes    Quit date: 12/17/2010    Years since quitting: 10.0   Smokeless tobacco: Never   Tobacco comments:    Smoked once in a while. not a daily smoker  Vaping Use   Vaping Use: Never used  Substance and Sexual Activity   Alcohol use: Yes    Comment: "1 glass of wine/month maybe" (10/31/2016)   Drug use:  No   Sexual activity: Not Currently  Other Topics Concern   Not on file  Social History  Narrative   Pt lives in [place] with spouse. 2 grown children.   Works at [employer] in [place].   Social Determinants of Health   Financial Resource Strain: Not on file  Food Insecurity: Not on file  Transportation Needs: Not on file  Physical Activity: Not on file  Stress: Not on file  Social Connections: Not on file  Intimate Partner Violence: Not on file    Review of Systems: All other review of systems negative except as mentioned in the HPI.  Physical Exam: Vital signs BP 139/89   Pulse (!) 106   Temp 97.7 F (36.5 C)   Ht 6' (1.829 m)   Wt 195 lb (88.5 kg)   SpO2 99%   BMI 26.45 kg/m   General:   Alert,  Well-developed, well-nourished, pleasant and cooperative in NAD Lungs:  Clear throughout to auscultation.   Heart:  AF with RVR, rates ranging from 100-120s;  Abdomen:  Soft, nontender and nondistended. Normal bowel sounds.   Neuro/Psych:  Alert and cooperative. Normal mood and affect. A and O x 3  Jolly Mango, MD [facility]

## 2021-01-19 ENCOUNTER — Telehealth (HOSPITAL_COMMUNITY): Payer: Self-pay

## 2021-01-19 NOTE — Telephone Encounter (Signed)
Patient called and states he is back in normal sinus rhythm. He started back on his Eliquis '5mg'$  taking twice daily. Scheduled him for a follow up on August 24th @ 8:30am with Ceasar Lund. Patient given parking code to access garage for parking. He wants to speak with Butch Penny regarding rescheduling his Colonoscopy to be performed. Advised patient this is an elective procedure and he should discuss this with Dr. Havery Moros about proceeding with another Colonoscopy. Consulted with patient and he verbalized understanding.

## 2021-01-24 ENCOUNTER — Ambulatory Visit (HOSPITAL_COMMUNITY)
Admission: RE | Admit: 2021-01-24 | Discharge: 2021-01-24 | Disposition: A | Discharge status: Home / Self Care | Payer: 59 | Source: Ambulatory Visit | Attending: Nurse Practitioner | Admitting: Nurse Practitioner

## 2021-01-24 ENCOUNTER — Telehealth: Payer: Self-pay

## 2021-01-24 ENCOUNTER — Other Ambulatory Visit: Payer: Self-pay

## 2021-01-24 VITALS — BP 150/82 | HR 52 | Ht 72.0 in | Wt 195.6 lb

## 2021-01-24 DIAGNOSIS — D6869 Other thrombophilia: Secondary | ICD-10-CM | POA: Diagnosis not present

## 2021-01-24 DIAGNOSIS — I10 Essential (primary) hypertension: Secondary | ICD-10-CM | POA: Diagnosis not present

## 2021-01-24 DIAGNOSIS — Z87891 Personal history of nicotine dependence: Secondary | ICD-10-CM | POA: Insufficient documentation

## 2021-01-24 DIAGNOSIS — I4891 Unspecified atrial fibrillation: Secondary | ICD-10-CM | POA: Insufficient documentation

## 2021-01-24 DIAGNOSIS — I480 Paroxysmal atrial fibrillation: Secondary | ICD-10-CM | POA: Diagnosis not present

## 2021-01-24 DIAGNOSIS — Z8249 Family history of ischemic heart disease and other diseases of the circulatory system: Secondary | ICD-10-CM | POA: Insufficient documentation

## 2021-01-24 LAB — BASIC METABOLIC PANEL
Anion gap: 4 — ABNORMAL LOW (ref 5–15)
BUN: 16 mg/dL (ref 8–23)
CO2: 26 mmol/L (ref 22–32)
Calcium: 9.2 mg/dL (ref 8.9–10.3)
Chloride: 107 mmol/L (ref 98–111)
Creatinine, Ser: 1.05 mg/dL (ref 0.61–1.24)
GFR, Estimated: >60 mL/min (ref >60)
Glucose, Bld: 129 mg/dL — ABNORMAL HIGH (ref 70–99)
Potassium: 4.2 mmol/L (ref 3.5–5.1)
Sodium: 137 mmol/L (ref 135–145)

## 2021-01-24 LAB — MAGNESIUM: Magnesium: 2.3 mg/dL (ref 1.7–2.4)

## 2021-01-24 NOTE — Telephone Encounter (Signed)
Lm on vm for patient to return call to reschedule his colonoscopy in the [facility] with Dr. Havery Moros.

## 2021-01-24 NOTE — Telephone Encounter (Signed)
-----   Message from Yetta Flock, MD sent at 01/24/2021 11:07 AM EDT ----- Thanks very much for your help seeing this patient.   Nhan Qualley can you please reschedule him for a colonoscopy? Plan is to do full prep in the evening (not split dose) to see if he will tolerate this better in regards to his AF. Thanks     ----- Message ----- From: Sherran Needs, NP Sent: 01/24/2021  10:05 AM EDT To: Biagio Borg, MD, Yetta Flock, MD

## 2021-01-24 NOTE — Progress Notes (Signed)
Primary Care Physician: Biagio Borg, MD Referring Physician: Luana Shu is a 60 y.o. male with a h/o afib in the afib clinic for Tikosyn surveillance. He reports that he has not noted any sustained afib. He will have an infrequent breakthrough episode, rarely lasts more than one day.  He is being compliant with  Tikosyn. and  eliquis with a CHA2DS2VASc score of 1. No bleeding issues.  He presented in afib this am, but by auscultation was back in SR by end of the appointment. He has had his covid shots this  last spring.   F/u in  afib clinic, 07/12/20. He has been maintaining SR but feels draggy in the am. His BP is soft in the am and he may not need the low dose amlodipine. He continues on tikosyn and eliquis.   F/u in afib clinic, 01/24/21 for Tikosyn surveillance.  He presented for a colonoscopy last week and was in afib with RVR so the procedure was cancelled and referred back here. He reports that he returned to SR later that day. He has h/o of intermittent breakthrough episodes that last less than 24 hours. He is being compliant with Tikosyn and eliqiuis 5 mg bid. He states that he had to drink the solution for clean out the evening before and again the next morning. He took his meds around 9 am but had the feeling he was in afib before he left the house for the procedure that afternoon. Probably, he had poor absorption of meds with the continuation of washout  prep the next morning.   Today, he denies symptoms of palpitations, chest pain, shortness of breath, orthopnea, PND, lower extremity edema, dizziness, presyncope, syncope, or neurologic sequela. The patient is tolerating medications without difficulties and is otherwise without complaint today.   Past Medical History:  Diagnosis Date   Atrial flutter ([place])    CHEST PAIN-PRECORDIAL 05/25/2009   ERECTILE DYSFUNCTION, ORGANIC 01/16/2010   GERD (gastroesophageal reflux disease)    HYPERLIPIDEMIA 12/15/2007    HYPERTENSION 12/15/2007   HYPOKALEMIA 12/15/2007   Internal hemorrhoids    OSA (obstructive sleep apnea) 09/22/2019   Persistent atrial fibrillation ([place])    a. prior ablation/flecainide; placed on Tikosyn 07/2017.   POSTURAL LIGHTHEADEDNESS 07/06/2008   Sinus bradycardia    Sleep apnea    Visit for monitoring Tikosyn therapy 07/03/2017   Past Surgical History:  Procedure Laterality Date   ATRIAL FIBRILLATION ABLATION N/A 08/13/2011   Procedure: ATRIAL FIBRILLATION ABLATION;  Surgeon: Thompson Grayer, MD;  Location: [facility] CATH LAB;  Service: Cardiovascular;  Laterality: N/A;   ATRIAL FIBRILLATION ABLATION  10/31/2016   ATRIAL FIBRILLATION ABLATION N/A 10/31/2016   Procedure: Atrial Fibrillation Ablation;  Surgeon: Thompson Grayer, MD;  Location: [facility] CV LAB;  Service: Cardiovascular;  Laterality: N/A;   ATRIAL FLUTTER ABLATION N/A 12/27/2011   CTI repeat ablation by Dr Rayann Heman   CARDIOVERSION  11/21/2011   Procedure: CARDIOVERSION;  Surgeon: Larey Dresser, MD;  Location: [facility];  Service: Cardiovascular;  Laterality: N/A;   CARDIOVERSION N/A 12/02/2016   Procedure: CARDIOVERSION;  Surgeon: [name], MD;  Location: [facility] ENDOSCOPY;  Service: Cardiovascular;  Laterality: N/A;   CHALAZION EXCISION Right    ELBOW SURGERY Right early 1980s   bone spur/chip; "opened me up"   INGUINAL HERNIA REPAIR Right 11/2006   TEE WITHOUT CARDIOVERSION  08/12/2011   Procedure: TRANSESOPHAGEAL ECHOCARDIOGRAM (TEE);  Surgeon: Fay Records, MD;  Location: [facility];  Service: Cardiovascular;  Laterality: N/A;   TEE WITHOUT CARDIOVERSION N/A 10/30/2016   Procedure: TRANSESOPHAGEAL ECHOCARDIOGRAM (TEE);  Surgeon: Josue Hector, MD;  Location: [facility] ENDOSCOPY;  Service: Cardiovascular;  Laterality: N/A;   TEE WITHOUT CARDIOVERSION N/A 12/02/2016   Procedure: TRANSESOPHAGEAL ECHOCARDIOGRAM (TEE);  Surgeon: [name], MD;  Location: [facility] ENDOSCOPY;  Service: Cardiovascular;  Laterality: N/A;    Current  Outpatient Medications  Medication Sig Dispense Refill   apixaban (ELIQUIS) 5 MG TABS tablet Take 1 tablet (5 mg total) by mouth 2 (two) times daily. Appointment Required For Further Refills [phone] 60 tablet 0   Arginine 500 MG CAPS Take 500 mg by mouth daily.      Ascorbic Acid (VITAMIN C) 1000 MG tablet Take 1,000 mg by mouth daily.      atorvastatin (LIPITOR) 20 MG tablet Take 1 tablet by mouth once daily 90 tablet 2   cholecalciferol (VITAMIN D3) 10 MCG (400 UNIT) TABS tablet Take 400 Units by mouth daily.      diltiazem (CARDIZEM CD) 120 MG 24 hr capsule Take 1 capsule (120 mg total) by mouth daily. 90 capsule 3   diltiazem (CARDIZEM) 30 MG tablet Take 1 tablet every 4 hours AS NEEDED for heart rate over 100 45 tablet 2   dofetilide (TIKOSYN) 250 MCG capsule TAKE 1 CAPSULE BY MOUTH  TWICE DAILY 180 capsule 1   doxazosin (CARDURA) 4 MG tablet TAKE 1 TABLET BY MOUTH AT BEDTIME 90 tablet 2   fexofenadine (ALLEGRA) 180 MG tablet Take 1 tablet (180 mg total) by mouth daily. 30 tablet 2   fluticasone (FLONASE) 50 MCG/ACT nasal spray SMARTSIG:2 Spray(s) Both Nares Every Night     hyoscyamine (LEVSIN SL) 0.125 MG SL tablet Place 1 tablet (0.125 mg total) under the tongue every 6 (six) hours as needed (Abdominal Pain). 15 tablet 1   irbesartan (AVAPRO) 300 MG tablet Take 1 tablet (300 mg total) by mouth daily. 90 tablet 1   Lysine HCl 500 MG TABS Take 500 mg by mouth daily.      Multiple Vitamins-Minerals (MULTIVITAL) tablet Take 1 tablet by mouth daily.     nitroGLYCERIN (NITROSTAT) 0.4 MG SL tablet Place 1 tablet (0.4 mg total) under the tongue every 5 (five) minutes as needed for chest pain. 30 tablet 0   triamcinolone (NASACORT) 55 MCG/ACT AERO nasal inhaler Place 2 sprays into the nose daily. 1 Inhaler 12   Current Facility-Administered Medications  Medication Dose Route Frequency Provider Last Rate Last Admin   0.9 %  sodium chloride infusion  500 mL Intravenous Once Armbruster, Carlota Raspberry, MD        Allergies  Allergen Reactions   Ibuprofen Palpitations and Other (See Comments)    Rapid heart beat    Social History   Socioeconomic History   Marital status: Married    Spouse name: Not on file   Number of children: 2   Years of education: Not on file   Highest education level: Not on file  Occupational History   Occupation: SECURITY    Employer: [employer]  Tobacco Use   Smoking status: Former    Packs/day: 0.10    Years: 15.00    Pack years: 1.50    Types: Cigarettes    Quit date: 12/17/2010    Years since quitting: 10.1   Smokeless tobacco: Never   Tobacco comments:    Smoked once in a while. not a daily smoker  Vaping Use   Vaping Use: Never used  Substance and Sexual Activity   Alcohol use: Yes    Comment: "1 glass of wine/month maybe" (10/31/2016)   Drug use: No   Sexual activity: Not Currently  Other Topics Concern   Not on file  Social History Narrative   Pt lives in [place] with spouse. 2 grown children.   Works at [employer] in [place].   Social Determinants of Health   Financial Resource Strain: Not on file  Food Insecurity: Not on file  Transportation Needs: Not on file  Physical Activity: Not on file  Stress: Not on file  Social Connections: Not on file  Intimate Partner Violence: Not on file    Family History  Problem Relation Age of Onset   Stroke Mother    Other Father        deceased stomach hemorrhage   Heart disease Sister    Thyroid disease Sister    Sleep apnea Sister    Anesthesia problems Neg Hx    Esophageal cancer Neg Hx    Colon cancer Neg Hx    Rectal cancer Neg Hx    Stomach cancer Neg Hx     ROS- All systems are reviewed and negative except as per the HPI above  Physical Exam: Vitals:   01/24/21 0849  BP: (!) 150/82  Pulse: (!) 52  Weight: 88.7 kg  Height: 6' (1.829 m)   Wt Readings from Last 3 Encounters:  01/24/21 88.7 kg  01/17/21 88.5 kg  12/15/20 88.5 kg     Labs: Lab Results  Component Value Date   NA 141 09/22/2020   K 4.3 09/22/2020   CL 107 09/22/2020   CO2 26 09/22/2020   GLUCOSE 88 09/22/2020   BUN 22 09/22/2020   CREATININE 1.13 09/22/2020   CALCIUM 9.6 09/22/2020   MG 2.1 07/12/2020   Lab Results  Component Value Date   INR 1.8 (A) 09/11/2018   Lab Results  Component Value Date   CHOL 209 (H) 09/22/2020   HDL 41.30 09/22/2020   LDLCALC 62 09/22/2019   TRIG (H) 09/22/2020    508.0 Triglyceride is over 400; calculations on Lipids are invalid.     GEN- The patient is well appearing, alert and oriented x 3 today.   Head- normocephalic, atraumatic Eyes-  Sclera clear, conjunctiva pink Ears- hearing intact Oropharynx- clear Neck- supple, no JVP Lymph- no cervical lymphadenopathy Lungs- Clear to ausculation bilaterally, normal work of breathing Heart-slow, regular on presentation by ekg, but with regular rhythm on auscultation,  no murmurs, rubs or gallops, PMI not laterally displaced GI- soft, NT, ND, + BS Extremities- no clubbing, cyanosis, or edema MS- no significant deformity or atrophy Skin- no rash or lesion Psych- euthymic mood, full affect Neuro- strength and sensation are intact  EKG-  Sinus brady at 52 bpm, pr int 152 ms, qrs int 82 ms, qtc 498 ms    Assessment and Plan: 1. Afib  Staying in SR with Tikosyn until recent wash out prep for colonoscopy, he was back in rhythm by that evening.  He can return to try colonoscopy  again, it would be preferable that the wash out prep is completed the evening before so that his am meds have full potential for absorption He was told next time, to check frequently starting the am of the procedure that he is in SR and can take an extra 30 mg cardizem if he does go into to afib to try to slow it down to a normal HR range so  colonoscopy would be permitted or it has a chance to restore SR  Continue dofetilide 250 mcg  bid and diltiazem 120 mg qd Bmet/mag today   2.  CHA2DS2VASc score of 1 Continue eliquis 5 mg bid per pt's choice  3. HTN Stable   F/u in 6 months   Geroge Baseman. Quadir Muns, [facility] [address] [place], [place] [address] [phone]

## 2021-01-29 ENCOUNTER — Other Ambulatory Visit (INDEPENDENT_AMBULATORY_CARE_PROVIDER_SITE_OTHER): Payer: Self-pay | Admitting: Otolaryngology

## 2021-01-30 NOTE — Telephone Encounter (Signed)
Lm on vm for patient to return call to re-schedule his colonoscopy with Dr. Havery Moros.

## 2021-02-02 ENCOUNTER — Other Ambulatory Visit: Payer: Self-pay | Admitting: Nurse Practitioner

## 2021-02-07 NOTE — Telephone Encounter (Signed)
Spoke with patient to reschedule his colonoscopy. Patient has been scheduled for a pre-visit on Monday, 03/19/21 at 1 PM. Pt is aware that he will need to check in on the 3rd floor for pre-visit appt. Prep recommendations added to pre-visit appt note. Colonoscopy is scheduled for Wednesday, 03/28/21 at 8:30 am. Pt is aware that he will need to arrive on the 4th floor at 7:30 am with a care partner. Patient verbalized understanding of all information and had no concerns at the end of the call.

## 2021-02-13 IMAGING — DX DG CHEST 2V
2 series · 2 of 2 positions shown · non-contrast
Comparison: 10/19/2018

CLINICAL DATA: Cough, chest pain, and chest congestion.

EXAM:
CHEST - 2 VIEW

[chest pa]
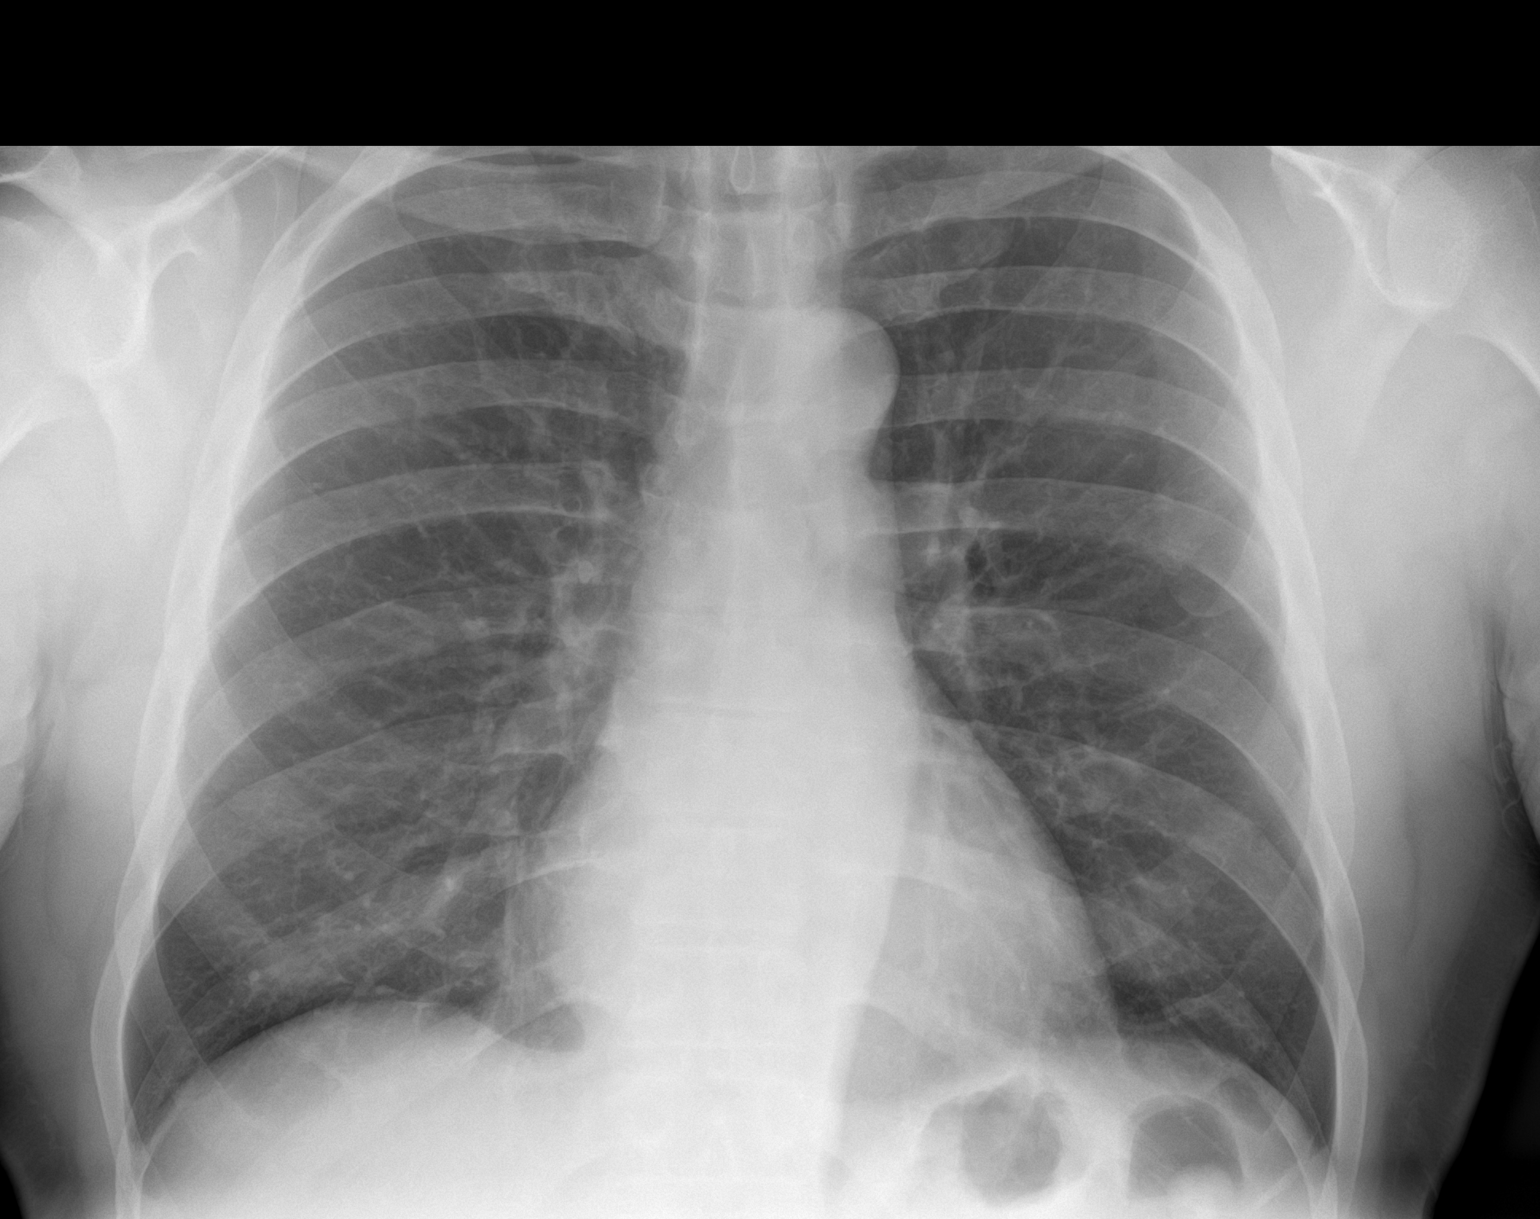

[chest lat]
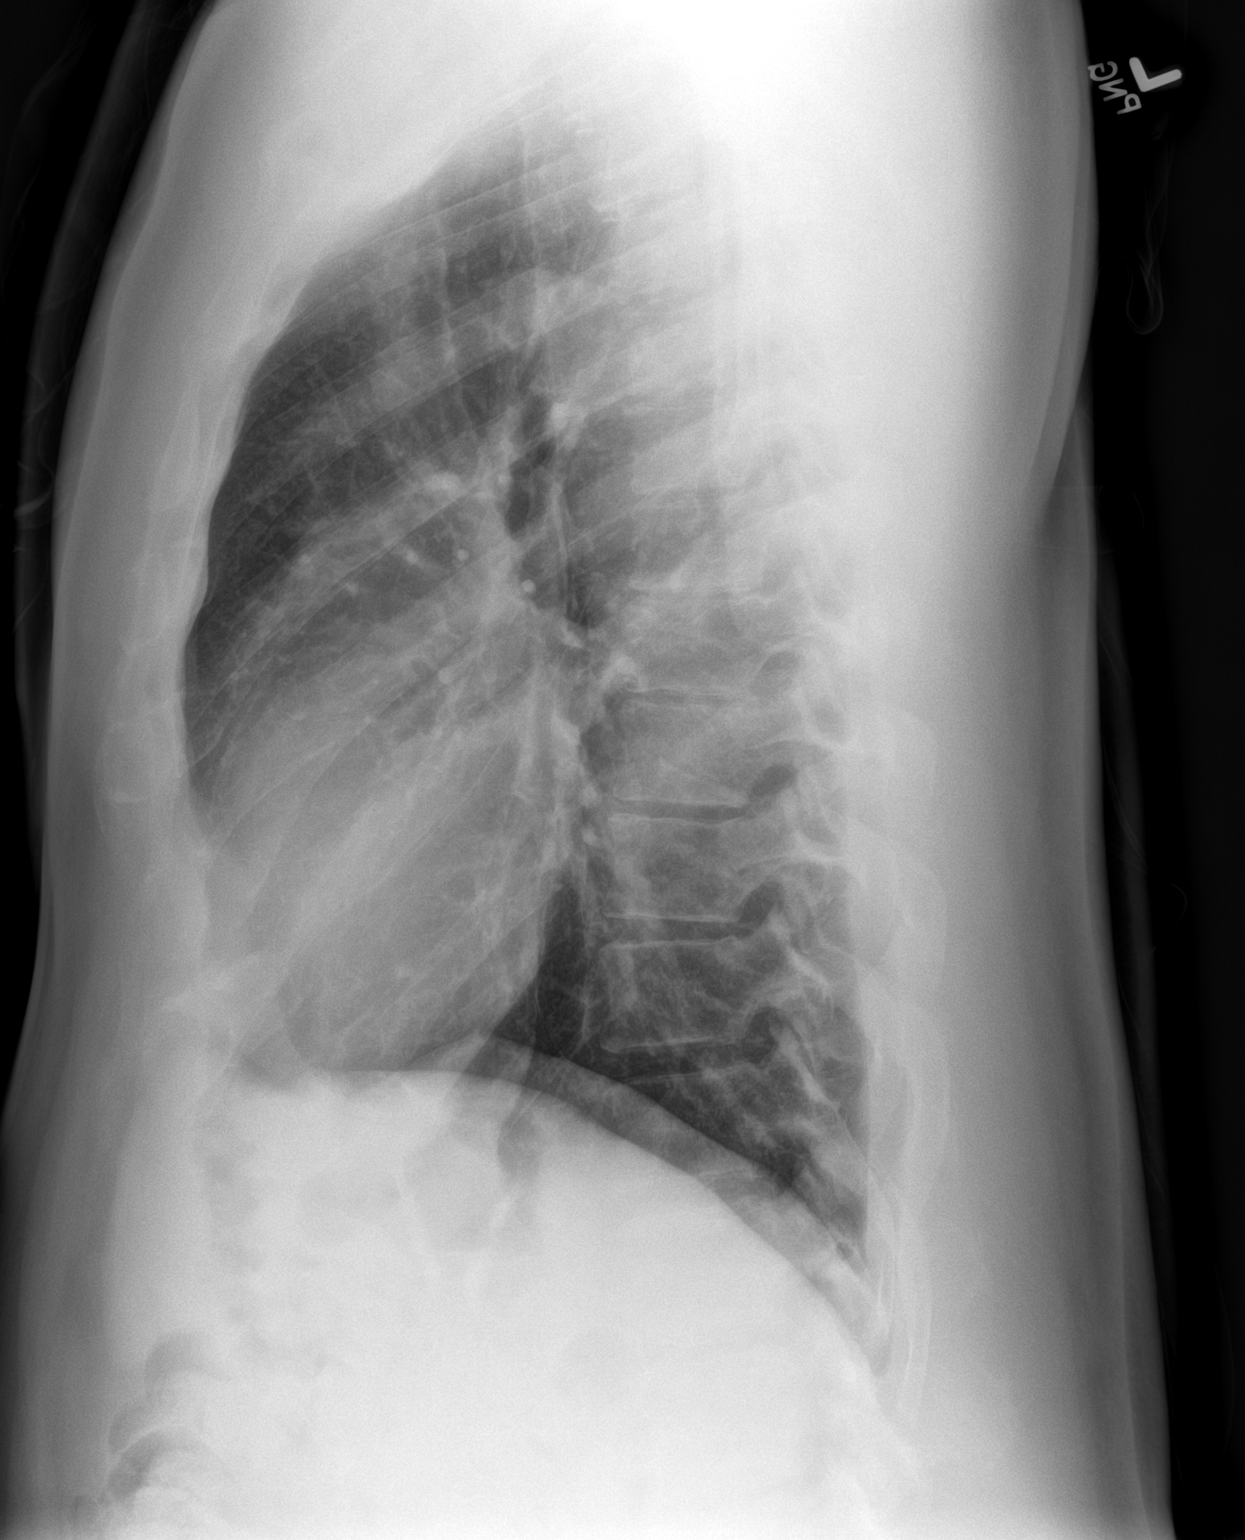

[2 of 2 positions shown; findings below may reference images not displayed]

FINDINGS: The cardiomediastinal silhouette is within normal limits. The lungs
are well inflated and clear. There is no evidence of pleural
effusion or pneumothorax. No acute osseous abnormality is
identified.
IMPRESSION: No active cardiopulmonary disease.

## 2021-02-15 ENCOUNTER — Other Ambulatory Visit (INDEPENDENT_AMBULATORY_CARE_PROVIDER_SITE_OTHER): Payer: Self-pay | Admitting: Otolaryngology

## 2021-02-15 MED ORDER — LORATADINE 10 MG PO TABS: 10.0000 mg | ORAL_TABLET | Freq: Every day | ORAL | 11 refills | Status: DC

## 2021-03-19 ENCOUNTER — Other Ambulatory Visit: Payer: Self-pay

## 2021-03-19 ENCOUNTER — Ambulatory Visit (AMBULATORY_SURGERY_CENTER): Payer: Self-pay

## 2021-03-19 VITALS — Ht 72.0 in | Wt 195.0 lb

## 2021-03-19 DIAGNOSIS — Z86010 Personal history of colonic polyps: Secondary | ICD-10-CM

## 2021-03-19 DIAGNOSIS — R1084 Generalized abdominal pain: Secondary | ICD-10-CM

## 2021-03-19 MED ORDER — PEG-KCL-NACL-NASULF-NA ASC-C 100 G PO SOLR: 1.0000 | Freq: Once | ORAL | 0 refills | Status: AC

## 2021-03-19 NOTE — Progress Notes (Signed)
No egg or soy allergy known to patient  No issues known to pt with past sedation with any surgeries or procedures Patient denies ever being told they had issues or difficulty with intubation  No FH of Malignant Hyperthermia Pt is not on diet pills Pt is not on home 02  Pt IS on blood thinners ---ELIQUIS- advised to hold medication x 2 days prior to procedure Pt denies issues with constipation at this time;  No A flutter AFIB dx Pt is fully vaccinated for Covid x 2 + booster; NO PA's for preps discussed with pt in [facility] today  Discussed with pt there will be an out-of-pocket cost for prep and that varies from $0 to 70 +  dollars - pt verbalized understanding  Due to the COVID-19 pandemic we are asking patients to follow certain guidelines in PV and the [place]   Pt aware of COVID protocols and [facility] guidelines

## 2021-03-27 ENCOUNTER — Ambulatory Visit (HOSPITAL_COMMUNITY)
Admission: EM | Admit: 2021-03-27 | Discharge: 2021-03-27 | Disposition: A | Discharge status: Home / Self Care | Payer: 59 | Attending: Emergency Medicine | Admitting: Emergency Medicine

## 2021-03-27 ENCOUNTER — Encounter (HOSPITAL_COMMUNITY): Payer: Self-pay | Admitting: Emergency Medicine

## 2021-03-27 ENCOUNTER — Other Ambulatory Visit: Payer: Self-pay

## 2021-03-27 DIAGNOSIS — M25531 Pain in right wrist: Secondary | ICD-10-CM | POA: Diagnosis not present

## 2021-03-27 DIAGNOSIS — M545 Low back pain, unspecified: Secondary | ICD-10-CM

## 2021-03-27 MED ORDER — CYCLOBENZAPRINE HCL 10 MG PO TABS: 10.0000 mg | ORAL_TABLET | Freq: Every day | ORAL | 0 refills | Status: DC

## 2021-03-27 MED ORDER — KETOROLAC TROMETHAMINE 30 MG/ML IJ SOLN
INTRAMUSCULAR | Status: AC
  Filled 2021-03-27: qty 1

## 2021-03-27 MED ORDER — PREDNISONE 20 MG PO TABS
40.0000 mg | ORAL_TABLET | Freq: Every day | ORAL | 0 refills | Status: DC
Start: 2021-03-27 — End: 2021-07-25

## 2021-03-27 MED ORDER — KETOROLAC TROMETHAMINE 30 MG/ML IJ SOLN
30.0000 mg | Freq: Once | INTRAMUSCULAR | Status: AC
  Administered 2021-03-27: 30 mg via INTRAMUSCULAR

## 2021-03-27 MED ORDER — MELOXICAM 7.5 MG PO TABS: 7.5000 mg | ORAL_TABLET | Freq: Every day | ORAL | 0 refills | Status: DC
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
## 2021-03-27 NOTE — Discharge Instructions (Signed)
Your pain is most likely caused by irritation to the muscles or ligaments.   Starting tomorrow morning take prednisone every day with food, ok with MD before procedure  Once complete can take meloxicam every morning as needed  Can use muscle relaxer at bedtime as needed for additional comfort    You may use heating pad in 15 minute intervals as needed for additional comfort, within the first 2-3 days you may find comfort in using ice in 10-15 minutes over affected area  Begin stretching affected area daily for 10 minutes as tolerated to further loosen muscles   When lying down place pillow underneath and between knees for support  Can try sleeping without pillow on firm mattress   Practice good posture: head back, shoulders back, chest forward, pelvis back and weight distributed evenly on both legs  If pain persist after recommended treatment or reoccurs if may be beneficial to follow up with orthopedic specialist for evaluation, this doctor specializes in the bones and can manage your symptoms long-term with options such as but not limited to imaging, medications or physical therapy

## 2021-03-28 ENCOUNTER — Encounter: Payer: Self-pay | Admitting: Gastroenterology

## 2021-03-28 ENCOUNTER — Ambulatory Visit (AMBULATORY_SURGERY_CENTER): Payer: 59 | Admitting: Gastroenterology

## 2021-03-28 VITALS — BP 141/88 | HR 43 | Temp 97.8°F | Resp 17 | Ht 72.0 in | Wt 195.0 lb

## 2021-03-28 DIAGNOSIS — D122 Benign neoplasm of ascending colon: Secondary | ICD-10-CM

## 2021-03-28 DIAGNOSIS — Z86010 Personal history of colonic polyps: Secondary | ICD-10-CM

## 2021-03-28 DIAGNOSIS — R1084 Generalized abdominal pain: Secondary | ICD-10-CM

## 2021-03-28 DIAGNOSIS — K635 Polyp of colon: Secondary | ICD-10-CM

## 2021-03-28 MED ORDER — SODIUM CHLORIDE 0.9 % IV SOLN: 500.0000 mL | Freq: Once | INTRAVENOUS | Status: DC

## 2021-03-28 NOTE — Progress Notes (Signed)
0825 HR < 40 with Robinul 0.2 mg given IV. MD updated, vss

## 2021-03-28 NOTE — Progress Notes (Signed)
Called to room to assist during endoscopic procedure.  Patient ID and intended procedure confirmed with present staff. Received instructions for my participation in the procedure from the performing physician.  

## 2021-03-28 NOTE — Progress Notes (Signed)
VS by DT  Pt's states no medical or surgical changes since previsit or office visit.  

## 2021-03-28 NOTE — Patient Instructions (Signed)
Handouts on diverticulosis, hemorrhoids, and polyps given to patient. Await pathology results. Resume previous diet and continue present medications - resume Eliquis tomorrow 03/29/2021 If abdominal pain persists/worsens then contact Dr. Havery Moros, will consider imaging  Repeat colonoscopy for surveillance will be determined based off pathology results    YOU HAD AN ENDOSCOPIC PROCEDURE TODAY AT [place]:   Refer to the procedure report that was given to you for any specific questions about what was found during the examination.  If the procedure report does not answer your questions, please call your gastroenterologist to clarify.  If you requested that your care partner not be given the details of your procedure findings, then the procedure report has been included in a sealed envelope for you to review at your convenience later.  YOU SHOULD EXPECT: Some feelings of bloating in the abdomen. Passage of more gas than usual.  Walking can help get rid of the air that was put into your GI tract during the procedure and reduce the bloating. If you had a lower endoscopy (such as a colonoscopy or flexible sigmoidoscopy) you may notice spotting of blood in your stool or on the toilet paper. If you underwent a bowel prep for your procedure, you may not have a normal bowel movement for a few days.  Please Note:  You might notice some irritation and congestion in your nose or some drainage.  This is from the oxygen used during your procedure.  There is no need for concern and it should clear up in a day or so.  SYMPTOMS TO REPORT IMMEDIATELY:  Following lower endoscopy (colonoscopy or flexible sigmoidoscopy):  Excessive amounts of blood in the stool  Significant tenderness or worsening of abdominal pains  Swelling of the abdomen that is new, acute  Fever of 100F or higher   For urgent or emergent issues, a gastroenterologist can be reached at any hour by calling [phone]. Do  not use MyChart messaging for urgent concerns.    DIET:  We do recommend a small meal at first, but then you may proceed to your regular diet.  Drink plenty of fluids but you should avoid alcoholic beverages for 24 hours.  ACTIVITY:  You should plan to take it easy for the rest of today and you should NOT DRIVE or use heavy machinery until tomorrow (because of the sedation medicines used during the test).    FOLLOW UP: Our staff will call the number listed on your records 48-72 hours following your procedure to check on you and address any questions or concerns that you may have regarding the information given to you following your procedure. If we do not reach you, we will leave a message.  We will attempt to reach you two times.  During this call, we will ask if you have developed any symptoms of COVID 19. If you develop any symptoms (ie: fever, flu-like symptoms, shortness of breath, cough etc.) before then, please call [phone].  If you test positive for Covid 19 in the 2 weeks post procedure, please call and report this information to [place].    If any biopsies were taken you will be contacted by phone or by letter within the next 1-3 weeks.  Please call us at [phone] if you have not heard about the biopsies in 3 weeks.    SIGNATURES/CONFIDENTIALITY: You and/or your care partner have signed paperwork which will be entered into your electronic medical record.  These signatures attest to the fact that  that the information above on your After Visit Summary has been reviewed and is understood.  Full responsibility of the confidentiality of this discharge information lies with you and/or your care-partner.

## 2021-03-28 NOTE — Op Note (Signed)
[place] Patient Name: [name] [name] Procedure Date: 03/28/2021 8:11 AM MRN: [id number] Endoscopist: [name] [name] , [name] [name] Age: 60 Referring [name] [name]:  Date of Birth: Oct 08, 1960 Gender: Male Account #: [id number] Procedure:                Colonoscopy Indications:              High risk colon cancer surveillance: Personal                            history of colonic polyps (11/2015 - adenomas),                            history of abdominal pain Medicines:                Monitored Anesthesia Care Procedure:                Pre-Anesthesia Assessment:                           - Prior to the procedure, a History and Physical                            was performed, and patient medications and                            allergies were reviewed. The patient's tolerance of                            previous anesthesia was also reviewed. The risks                            and benefits of the procedure and the sedation                            options and risks were discussed with the patient.                            All questions were answered, and informed consent                            was obtained. Prior Anticoagulants: The patient has                            taken Eliquis (apixaban), last dose was 2 days                            prior to procedure. ASA Grade Assessment: II - A                            patient with mild systemic disease. After reviewing                            the risks and benefits, the patient was deemed in  satisfactory condition to undergo the procedure.                           After obtaining informed consent, the colonoscope                            was passed under direct vision. Throughout the                            procedure, the patient's blood pressure, pulse, and                            oxygen saturations were monitored continuously. The                            Colonoscope was  introduced through the anus and                            advanced to the the terminal ileum, with                            identification of the appendiceal orifice and IC                            valve. The colonoscopy was performed without                            difficulty. The patient tolerated the procedure                            well. The quality of the bowel preparation was                            adequate. The terminal ileum, ileocecal valve,                            appendiceal orifice, and rectum were photographed. Scope In: 8:24:12 AM Scope Out: 8:39:11 AM Scope Withdrawal Time: 0 hours 12 minutes 5 seconds  Total Procedure Duration: 0 hours 14 minutes 59 seconds  Findings:                 The perianal and digital rectal examinations were                            normal.                           The terminal ileum appeared normal.                           A 3 to 4 mm polyp was found in the ascending colon.                            The polyp was flat. The polyp was removed with a  cold snare. Resection and retrieval were complete.                           A few small-mouthed diverticula were found in the                            transverse colon and ascending colon.                           Internal hemorrhoids were found during                            retroflexion. The hemorrhoids were small.                           The exam was otherwise without abnormality. No                            cause for abdominal discomfort. Complications:            No immediate complications. Estimated blood loss:                            Minimal. Estimated Blood Loss:     Estimated blood loss was minimal. Impression:               - The examined portion of the ileum was normal.                           - One 3 to 4 mm polyp in the ascending colon,                            removed with a cold snare. Resected and retrieved.                            - Diverticulosis in the transverse colon and in the                            ascending colon.                           - Internal hemorrhoids.                           - The examination was otherwise normal.                           No cause for abdominal pain - patient states this                            has improved with time and mild / intermittent at                            this point. Recommendation:           - Patient has a contact number available for  emergencies. The signs and symptoms of potential                            delayed complications were discussed with the                            patient. Return to normal activities tomorrow.                            Written discharge instructions were provided to the                            patient.                           - Resume previous diet.                           - Continue present medications.                           - Resume Eliquis tomorrow                           - Await pathology results.                           - If abdominal pain persists / worsens contact me,                            will consider imaging [name] Lipps P. Jewelz Kobus, [name] [name] 03/28/2021 8:50:33 AM This report has been signed electronically.

## 2021-03-28 NOTE — Progress Notes (Signed)
Report given to PACU, vss 

## 2021-03-28 NOTE — Progress Notes (Signed)
[facility] History and Physical   Primary Care Physician:  Biagio Borg, MD   Reason for Procedure:   History of colon polyps, abdominal pains  Plan:    Colonoscopy     HPI: [name] [name] is a 60 y.o. male  here for colonoscopy surveillance and to evaluate abdominal pains treated with Levsin. Patient previously scheduled for his colonoscopy on August 17th. At that time he presented with AF with RVR - he has known AF, on Tikosyn, diltiazem, and Eliquis. He has issues with rate control at times, was seen in follow up with cardiology after that occurrence. Exam was not performed on 8/17. Patient denies any bowel symptoms at this time. No family history of colon cancer known. Otherwise feels well without any cardiopulmonary symptoms. Denies any issues with AF since I have seen him. In sinus rhythm this AM which is rate controlled. He denies complaints otherwise. Last dose Eliquis 2 days ago.   Past Medical History:  Diagnosis Date   A-fib [facility])    on Eliquis   CHEST PAIN-PRECORDIAL 05/25/2009   ERECTILE DYSFUNCTION, ORGANIC 01/16/2010   GERD (gastroesophageal reflux disease)    hx of   HYPERLIPIDEMIA 12/15/2007   on meds   HYPERTENSION 12/15/2007   on meds   HYPOKALEMIA 12/15/2007   Internal hemorrhoids    Persistent atrial fibrillation ([place])    a. prior ablation/flecainide; placed on Tikosyn 07/2017.   POSTURAL LIGHTHEADEDNESS 07/06/2008   Seasonal allergies    Sinus bradycardia    Sleep apnea    does not use CPAP   Visit for monitoring Tikosyn therapy 07/03/2017    Past Surgical History:  Procedure Laterality Date   ATRIAL FIBRILLATION ABLATION N/A 08/13/2011   Procedure: ATRIAL FIBRILLATION ABLATION;  Surgeon: Thompson Grayer, MD;  Location: [facility] CATH LAB;  Service: Cardiovascular;  Laterality: N/A;   ATRIAL FIBRILLATION ABLATION  10/31/2016   ATRIAL FIBRILLATION ABLATION N/A 10/31/2016   Procedure: Atrial Fibrillation Ablation;  Surgeon: Thompson Grayer, MD;   Location: [facility] CV LAB;  Service: Cardiovascular;  Laterality: N/A;   ATRIAL FLUTTER ABLATION N/A 12/27/2011   CTI repeat ablation by Dr Rayann Heman   CARDIOVERSION  11/21/2011   Procedure: CARDIOVERSION;  Surgeon: Larey Dresser, MD;  Location: [facility];  Service: Cardiovascular;  Laterality: N/A;   CARDIOVERSION N/A 12/02/2016   Procedure: CARDIOVERSION;  Surgeon: [name], MD;  Location: [facility] ENDOSCOPY;  Service: Cardiovascular;  Laterality: N/A;   CHALAZION EXCISION Right    COLONOSCOPY  2017   SA-MAC-suprep (good)-TA x 2   ELBOW SURGERY Right early 1980s   bone spur/chip; "opened me up"   INGUINAL HERNIA REPAIR Right 11/2006   POLYPECTOMY  2017   TA x 2   TEE WITHOUT CARDIOVERSION  08/12/2011   Procedure: TRANSESOPHAGEAL ECHOCARDIOGRAM (TEE);  Surgeon: Fay Records, MD;  Location: [facility] ENDOSCOPY;  Service: Cardiovascular;  Laterality: N/A;   TEE WITHOUT CARDIOVERSION N/A 10/30/2016   Procedure: TRANSESOPHAGEAL ECHOCARDIOGRAM (TEE);  Surgeon: Josue Hector, MD;  Location[facility] ENDOSCOPY;  Service: Cardiovascular;  Laterality: N/A;   TEE WITHOUT CARDIOVERSION N/A 12/02/2016   Procedure: TRANSESOPHAGEAL ECHOCARDIOGRAM (TEE);  Surgeon: [name], MD;  Location: [facility] ENDOSCOPY;  Service: Cardiovascular;  Laterality: N/A;    Prior to Admission medications   Medication Sig Start Date End Date Taking? Authorizing Provider  apixaban (ELIQUIS) 5 MG TABS tablet Take 1 tablet (5 mg total) by mouth 2 (two) times daily. 02/06/21  Yes Sherran Needs, NP  Arginine  500 MG CAPS Take 500 mg by mouth daily.    Yes [provider]  Ascorbic Acid (VITAMIN C) 1000 MG tablet Take 1,000 mg by mouth daily.    Yes [provider]  atorvastatin (LIPITOR) 20 MG tablet Take 1 tablet by mouth once daily 12/18/20  Yes Biagio Borg, MD  cholecalciferol (VITAMIN D3) 10 MCG (400 UNIT) TABS tablet Take 400 Units by mouth daily.    Yes [provider]  diltiazem (CARDIZEM CD) 120 MG  24 hr capsule Take 1 capsule (120 mg total) by mouth daily. 12/01/20  Yes Biagio Borg, MD  diltiazem (CARDIZEM) 30 MG tablet Take 1 tablet every 4 hours AS NEEDED for heart rate over 100 03/26/18  Yes Sherran Needs, NP  dofetilide (TIKOSYN) 250 MCG capsule TAKE 1 CAPSULE BY MOUTH  TWICE DAILY 10/11/20  Yes Sherran Needs, NP  doxazosin (CARDURA) 4 MG tablet TAKE 1 TABLET BY MOUTH AT BEDTIME 12/18/20  Yes Biagio Borg, MD  fluticasone [facility]) 50 MCG/ACT nasal spray SMARTSIG:2 Spray(s) Both Nares Every Night 01/14/21  Yes [provider]  irbesartan (AVAPRO) 300 MG tablet Take 1 tablet (300 mg total) by mouth daily. 12/01/20  Yes Biagio Borg, MD  loratadine (CLARITIN) 10 MG tablet Take 1 tablet (10 mg total) by mouth daily. 02/15/21  Yes Rozetta Nunnery, MD  Multiple Vitamins-Minerals (MULTIVITAL) tablet Take 1 tablet by mouth daily.   Yes [provider]  cyclobenzaprine (FLEXERIL) 10 MG tablet Take 1 tablet (10 mg total) by mouth at bedtime. 03/27/21   White, Leitha Schuller, NP  hyoscyamine (LEVSIN SL) 0.125 MG SL tablet Place 1 tablet (0.125 mg total) under the tongue every 6 (six) hours as needed (Abdominal Pain). 12/26/20   Zehr, Laban Emperor, PA-C  Lysine HCl 500 MG TABS Take 500 mg by mouth daily.     [provider]  meloxicam (MOBIC) 7.5 MG tablet Take 1 tablet (7.5 mg total) by mouth daily. 03/27/21   White, Leitha Schuller, NP  nitroGLYCERIN (NITROSTAT) 0.4 MG SL tablet Place 1 tablet (0.4 mg total) under the tongue every 5 (five) minutes as needed for chest pain. 12/18/20   Sherran Needs, NP  predniSONE (DELTASONE) 20 MG tablet Take 2 tablets (40 mg total) by mouth daily. 03/27/21   Hans Eden, NP    Current Outpatient Medications  Medication Sig Dispense Refill   apixaban (ELIQUIS) 5 MG TABS tablet Take 1 tablet (5 mg total) by mouth 2 (two) times daily. 60 tablet 11   Arginine 500 MG CAPS Take 500 mg by mouth daily.      Ascorbic Acid (VITAMIN C) 1000  MG tablet Take 1,000 mg by mouth daily.      atorvastatin (LIPITOR) 20 MG tablet Take 1 tablet by mouth once daily 90 tablet 2   cholecalciferol (VITAMIN D3) 10 MCG (400 UNIT) TABS tablet Take 400 Units by mouth daily.      diltiazem (CARDIZEM CD) 120 MG 24 hr capsule Take 1 capsule (120 mg total) by mouth daily. 90 capsule 3   diltiazem (CARDIZEM) 30 MG tablet Take 1 tablet every 4 hours AS NEEDED for heart rate over 100 45 tablet 2   dofetilide (TIKOSYN) 250 MCG capsule TAKE 1 CAPSULE BY MOUTH  TWICE DAILY 180 capsule 1   doxazosin (CARDURA) 4 MG tablet TAKE 1 TABLET BY MOUTH AT BEDTIME 90 tablet 2   fluticasone (FLONASE) 50 MCG/ACT nasal spray SMARTSIG:2 Spray(s) Both Nares  Every Night     irbesartan (AVAPRO) 300 MG tablet Take 1 tablet (300 mg total) by mouth daily. 90 tablet 1   loratadine (CLARITIN) 10 MG tablet Take 1 tablet (10 mg total) by mouth daily. 30 tablet 11   Multiple Vitamins-Minerals (MULTIVITAL) tablet Take 1 tablet by mouth daily.     cyclobenzaprine (FLEXERIL) 10 MG tablet Take 1 tablet (10 mg total) by mouth at bedtime. 10 tablet 0   hyoscyamine (LEVSIN SL) 0.125 MG SL tablet Place 1 tablet (0.125 mg total) under the tongue every 6 (six) hours as needed (Abdominal Pain). 15 tablet 1   Lysine HCl 500 MG TABS Take 500 mg by mouth daily.      meloxicam (MOBIC) 7.5 MG tablet Take 1 tablet (7.5 mg total) by mouth daily. 30 tablet 0   nitroGLYCERIN (NITROSTAT) 0.4 MG SL tablet Place 1 tablet (0.4 mg total) under the tongue every 5 (five) minutes as needed for chest pain. 30 tablet 0   predniSONE (DELTASONE) 20 MG tablet Take 2 tablets (40 mg total) by mouth daily. 10 tablet 0   Current Facility-Administered Medications  Medication Dose Route Frequency Provider Last Rate Last Admin   0.9 %  sodium chloride infusion  500 mL Intravenous Once Siyah Mault, Carlota Raspberry, MD       0.9 %  sodium chloride infusion  500 mL Intravenous Once Anda Sobotta, Carlota Raspberry, MD        Allergies as of  03/28/2021 - Review Complete 03/28/2021  Allergen Reaction Noted   Ibuprofen Palpitations and Other (See Comments) 12/15/2007    Family History  Problem Relation Age of Onset   Stroke Mother    Other Father        deceased stomach hemorrhage- alcohol use/abuse   Alcohol abuse Father    Heart disease Sister    Thyroid disease Sister    Sleep apnea Sister    Anesthesia problems Neg Hx    Esophageal cancer Neg Hx    Colon cancer Neg Hx    Rectal cancer Neg Hx    Stomach cancer Neg Hx    Colon polyps Neg Hx     Social History   Socioeconomic History   Marital status: Married    Spouse name: Not on file   Number of children: 2   Years of education: Not on file   Highest education level: Not on file  Occupational History   Occupation: SECURITY    Employer: [employer]  Tobacco Use   Smoking status: Former    Packs/day: 0.10    Years: 15.00    Pack years: 1.50    Types: Cigarettes    Quit date: 12/17/2010    Years since quitting: 10.2   Smokeless tobacco: Never   Tobacco comments:    Smoked once in a while. not a daily smoker  Vaping Use   Vaping Use: Never used  Substance and Sexual Activity   Alcohol use: Yes    Alcohol/week: 1.0 - 2.0 standard drink    Types: 1 - 2 Standard drinks or equivalent per week   Drug use: No   Sexual activity: Not Currently  Other Topics Concern   Not on file  Social History Narrative   Pt lives in [place] with spouse. 2 grown children.   Works at [employer] in [place].   Social Determinants of Health   Financial Resource Strain: Not on file  Food Insecurity: Not on file  Transportation Needs: Not on file  Physical  Activity: Not on file  Stress: Not on file  Social Connections: Not on file  Intimate Partner Violence: Not on file    Review of Systems: All other review of systems negative except as mentioned in the HPI.  Physical Exam: Vital signs BP 136/79   Pulse (!) 50   Temp 97.8 F (36.6 C)    Ht 6' (1.829 m)   Wt 195 lb (88.5 kg)   SpO2 100%   BMI 26.45 kg/m   General:   Alert,  Well-developed, pleasant and cooperative in NAD Lungs:  Clear throughout to auscultation.   Heart:  Regular rate and rhythm Abdomen:  Soft, nontender and nondistended.   Neuro/Psych:  Alert and cooperative. Normal mood and affect. A and O x 3  Jolly Mango, MD [facility]

## 2021-03-29 ENCOUNTER — Other Ambulatory Visit (HOSPITAL_COMMUNITY): Payer: Self-pay | Admitting: Nurse Practitioner

## 2021-03-30 ENCOUNTER — Telehealth: Payer: Self-pay

## 2021-03-30 NOTE — Telephone Encounter (Signed)
  Follow up Call-  Call back number 03/28/2021 01/17/2021  Post procedure Call Back phone  # [phone]  Permission to leave phone message Yes Yes  Some recent data might be hidden     Patient questions:  Do you have a fever, pain , or abdominal swelling? No. Pain Score  0 *  Have you tolerated food without any problems? Yes.    Have you been able to return to your normal activities? Yes.    Do you have any questions about your discharge instructions: Diet   No. Medications  No. Follow up visit  No.  Do you have questions or concerns about your Care? No.  Actions: * If pain score is 4 or above: No action needed, pain <4.

## 2021-05-24 DIAGNOSIS — R351 Nocturia: Secondary | ICD-10-CM | POA: Diagnosis not present

## 2021-05-24 DIAGNOSIS — N401 Enlarged prostate with lower urinary tract symptoms: Secondary | ICD-10-CM | POA: Diagnosis not present

## 2021-05-24 DIAGNOSIS — N5201 Erectile dysfunction due to arterial insufficiency: Secondary | ICD-10-CM | POA: Diagnosis not present

## 2021-07-12 ENCOUNTER — Other Ambulatory Visit (HOSPITAL_COMMUNITY): Payer: Self-pay | Admitting: *Deleted

## 2021-07-12 MED ORDER — DILTIAZEM HCL 30 MG PO TABS: ORAL_TABLET | ORAL | 2 refills | Status: AC

## 2021-07-25 ENCOUNTER — Ambulatory Visit (HOSPITAL_COMMUNITY)
Admission: RE | Admit: 2021-07-25 | Discharge: 2021-07-25 | Disposition: A | Discharge status: Home / Self Care | Payer: 59 | Source: Ambulatory Visit | Attending: Nurse Practitioner | Admitting: Nurse Practitioner

## 2021-07-25 ENCOUNTER — Encounter (HOSPITAL_COMMUNITY): Payer: Self-pay | Admitting: Nurse Practitioner

## 2021-07-25 ENCOUNTER — Other Ambulatory Visit: Payer: Self-pay

## 2021-07-25 VITALS — BP 134/84 | HR 54 | Ht 72.0 in | Wt 197.0 lb

## 2021-07-25 DIAGNOSIS — Z7901 Long term (current) use of anticoagulants: Secondary | ICD-10-CM | POA: Insufficient documentation

## 2021-07-25 DIAGNOSIS — I10 Essential (primary) hypertension: Secondary | ICD-10-CM | POA: Insufficient documentation

## 2021-07-25 DIAGNOSIS — D6869 Other thrombophilia: Secondary | ICD-10-CM

## 2021-07-25 DIAGNOSIS — I4891 Unspecified atrial fibrillation: Secondary | ICD-10-CM | POA: Diagnosis not present

## 2021-07-25 DIAGNOSIS — I480 Paroxysmal atrial fibrillation: Secondary | ICD-10-CM

## 2021-07-25 LAB — BASIC METABOLIC PANEL
Anion gap: 8 (ref 5–15)
BUN: 13 mg/dL (ref 8–23)
CO2: 24 mmol/L (ref 22–32)
Calcium: 9.3 mg/dL (ref 8.9–10.3)
Chloride: 106 mmol/L (ref 98–111)
Creatinine, Ser: 1 mg/dL (ref 0.61–1.24)
GFR, Estimated: >60 mL/min (ref >60)
Glucose, Bld: 129 mg/dL — ABNORMAL HIGH (ref 70–99)
Potassium: 3.8 mmol/L (ref 3.5–5.1)
Sodium: 138 mmol/L (ref 135–145)

## 2021-07-25 LAB — CBC
HCT: 43.1 % (ref 39.0–52.0)
Hemoglobin: 14 g/dL (ref 13.0–17.0)
MCH: 29.2 pg (ref 26.0–34.0)
MCHC: 32.5 g/dL (ref 30.0–36.0)
MCV: 89.8 fL (ref 80.0–100.0)
Platelets: 206 10*3/uL (ref 150–400)
RBC: 4.8 MIL/uL (ref 4.22–5.81)
RDW: 13 % (ref 11.5–15.5)
WBC: 8.8 10*3/uL (ref 4.0–10.5)
nRBC: 0 % (ref 0.0–0.2)

## 2021-07-25 LAB — MAGNESIUM: Magnesium: 1.9 mg/dL (ref 1.7–2.4)

## 2021-07-25 MED ORDER — DOFETILIDE 250 MCG PO CAPS: 250.0000 ug | ORAL_CAPSULE | Freq: Two times a day (BID) | ORAL | 3 refills | Status: DC

## 2021-07-25 NOTE — Progress Notes (Signed)
Primary Care Physician: Biagio Borg, MD Referring Physician: Luana Shu is a 61 y.o. male with a h/o afib in the afib clinic for Tikosyn surveillance. He reports that he has not noted any sustained afib. He will have an infrequent breakthrough episode, rarely lasts more than one day.  He is being compliant with  Tikosyn. and  eliquis with a CHA2DS2VASc score of 1. No bleeding issues.  He presented in afib this am, but by auscultation was back in SR by end of the appointment. He has had his covid shots this  last spring.   F/u in  afib clinic, 07/12/20. He has been maintaining SR but feels draggy in the am. His BP is soft in the am and he may not need the low dose amlodipine. He continues on tikosyn and eliquis.   F/u in afib clinic, 01/24/21 for Tikosyn surveillance.  He presented for a colonoscopy last week and was in afib with RVR so the procedure was cancelled and referred back here. He reports that he returned to SR later that day. He has h/o of intermittent breakthrough episodes that last less than 24 hours. He is being compliant with Tikosyn and eliqiuis 5 mg bid. He states that he had to drink the solution for clean out the evening before and again the next morning. He took his meds around 9 am but had the feeling he was in afib before he left the house for the procedure that afternoon. Probably, he had poor absorption of meds with the continuation of washout  prep the next morning.   Seeing back in afib clinic 07/25/21 for Tikosyn surveillance. He had an episode 07/12/21 that lasted 2 days triggered by work stress. He is in sinus brady today. [facility] notified pt that they will no longer be carrying dofetilide. We will assist pt to find another avenue to obtain drug.  Qt stable.   Today, he denies symptoms of palpitations, chest pain, shortness of breath, orthopnea, PND, lower extremity edema, dizziness, presyncope, syncope, or neurologic sequela. The patient is tolerating  medications without difficulties and is otherwise without complaint today.   Past Medical History:  Diagnosis Date   A-fib [facility])    on Eliquis   CHEST PAIN-PRECORDIAL 05/25/2009   ERECTILE DYSFUNCTION, ORGANIC 01/16/2010   GERD (gastroesophageal reflux disease)    hx of   HYPERLIPIDEMIA 12/15/2007   on meds   HYPERTENSION 12/15/2007   on meds   HYPOKALEMIA 12/15/2007   Internal hemorrhoids    Persistent atrial fibrillation ([place])    a. prior ablation/flecainide; placed on Tikosyn 07/2017.   POSTURAL LIGHTHEADEDNESS 07/06/2008   Seasonal allergies    Sinus bradycardia    Sleep apnea    does not use CPAP   Visit for monitoring Tikosyn therapy 07/03/2017   Past Surgical History:  Procedure Laterality Date   ATRIAL FIBRILLATION ABLATION N/A 08/13/2011   Procedure: ATRIAL FIBRILLATION ABLATION;  Surgeon: Thompson Grayer, MD;  Location[facility] CATH LAB;  Service: Cardiovascular;  Laterality: N/A;   ATRIAL FIBRILLATION ABLATION  10/31/2016   ATRIAL FIBRILLATION ABLATION N/A 10/31/2016   Procedure: Atrial Fibrillation Ablation;  Surgeon: Thompson Grayer, MD;  Location: [facility] CV LAB;  Service: Cardiovascular;  Laterality: N/A;   ATRIAL FLUTTER ABLATION N/A 12/27/2011   CTI repeat ablation by Dr Rayann Heman   CARDIOVERSION  11/21/2011   Procedure: CARDIOVERSION;  Surgeon: Larey Dresser, MD;  Location: [facility];  Service: Cardiovascular;  Laterality: N/A;   CARDIOVERSION  N/A 12/02/2016   Procedure: CARDIOVERSION;  Surgeon: [name], MD;  Location: [facility] ENDOSCOPY;  Service: Cardiovascular;  Laterality: N/A;   CHALAZION EXCISION Right    COLONOSCOPY  2017   SA-MAC-suprep (good)-TA x 2   ELBOW SURGERY Right early 1980s   bone spur/chip; "opened me up"   INGUINAL HERNIA REPAIR Right 11/2006   POLYPECTOMY  2017   TA x 2   TEE WITHOUT CARDIOVERSION  08/12/2011   Procedure: TRANSESOPHAGEAL ECHOCARDIOGRAM (TEE);  Surgeon: Fay Records, MD;  Location[facility] ENDOSCOPY;  Service: Cardiovascular;   Laterality: N/A;   TEE WITHOUT CARDIOVERSION N/A 10/30/2016   Procedure: TRANSESOPHAGEAL ECHOCARDIOGRAM (TEE);  Surgeon: Josue Hector, MD;  Location: [facility] ENDOSCOPY;  Service: Cardiovascular;  Laterality: N/A;   TEE WITHOUT CARDIOVERSION N/A 12/02/2016   Procedure: TRANSESOPHAGEAL ECHOCARDIOGRAM (TEE);  Surgeon: [name], MD;  Location: [facility] ENDOSCOPY;  Service: Cardiovascular;  Laterality: N/A;    Current Outpatient Medications  Medication Sig Dispense Refill   apixaban (ELIQUIS) 5 MG TABS tablet Take 1 tablet (5 mg total) by mouth 2 (two) times daily. 60 tablet 11   Arginine 500 MG CAPS Take 500 mg by mouth daily.      Ascorbic Acid (VITAMIN C) 1000 MG tablet Take 1,000 mg by mouth daily.      atorvastatin (LIPITOR) 20 MG tablet Take 1 tablet by mouth once daily 90 tablet 2   cholecalciferol (VITAMIN D3) 10 MCG (400 UNIT) TABS tablet Take 400 Units by mouth daily.      diltiazem (CARDIZEM CD) 120 MG 24 hr capsule Take 1 capsule (120 mg total) by mouth daily. 90 capsule 3   diltiazem (CARDIZEM) 30 MG tablet Take 1 tablet every 4 hours AS NEEDED for heart rate over 100 45 tablet 2   doxazosin (CARDURA) 4 MG tablet TAKE 1 TABLET BY MOUTH AT BEDTIME 90 tablet 2   fluticasone (FLONASE) 50 MCG/ACT nasal spray SMARTSIG:2 Spray(s) Both Nares Every Night     irbesartan (AVAPRO) 300 MG tablet Take 1 tablet (300 mg total) by mouth daily. 90 tablet 1   loratadine (CLARITIN) 10 MG tablet Take 1 tablet (10 mg total) by mouth daily. 30 tablet 11   Lysine HCl 500 MG TABS Take 500 mg by mouth daily.      Multiple Vitamins-Minerals (MULTIVITAL) tablet Take 1 tablet by mouth daily.     nitroGLYCERIN (NITROSTAT) 0.4 MG SL tablet Place 1 tablet (0.4 mg total) under the tongue every 5 (five) minutes as needed for chest pain. 30 tablet 0   dofetilide (TIKOSYN) 250 MCG capsule Take 1 capsule (250 mcg total) by mouth 2 (two) times daily. 180 capsule 3   No current facility-administered medications for this  encounter.    Allergies  Allergen Reactions   Ibuprofen Palpitations and Other (See Comments)    Rapid heart beat    Social History   Socioeconomic History   Marital status: Married    Spouse name: Not on file   Number of children: 2   Years of education: Not on file   Highest education level: Not on file  Occupational History   Occupation: SECURITY    Employer: [employer]  Tobacco Use   Smoking status: Former    Packs/day: 0.10    Years: 15.00    Pack years: 1.50    Types: Cigarettes    Quit date: 12/17/2010    Years since quitting: 10.6   Smokeless tobacco: Never   Tobacco comments:  Smoked once in a while. not a daily smoker  Vaping Use   Vaping Use: Never used  Substance and Sexual Activity   Alcohol use: Yes    Alcohol/week: 1.0 - 2.0 standard drink    Types: 1 - 2 Standard drinks or equivalent per week   Drug use: No   Sexual activity: Not Currently  Other Topics Concern   Not on file  Social History Narrative   Pt lives in [place] with spouse. 2 grown children.   Works at [employer] in [place].   Social Determinants of Health   Financial Resource Strain: Not on file  Food Insecurity: Not on file  Transportation Needs: Not on file  Physical Activity: Not on file  Stress: Not on file  Social Connections: Not on file  Intimate Partner Violence: Not on file    Family History  Problem Relation Age of Onset   Stroke Mother    Other Father        deceased stomach hemorrhage- alcohol use/abuse   Alcohol abuse Father    Heart disease Sister    Thyroid disease Sister    Sleep apnea Sister    Anesthesia problems Neg Hx    Esophageal cancer Neg Hx    Colon cancer Neg Hx    Rectal cancer Neg Hx    Stomach cancer Neg Hx    Colon polyps Neg Hx     ROS- All systems are reviewed and negative except as per the HPI above  Physical Exam: Vitals:   07/25/21 1136  BP: 134/84  Pulse: (!) 54  Weight: 89.4 kg  Height: 6' (1.829 m)    Wt Readings from Last 3 Encounters:  07/25/21 89.4 kg  03/28/21 88.5 kg  03/19/21 88.5 kg    Labs: Lab Results  Component Value Date   NA 138 07/25/2021   K 3.8 07/25/2021   CL 106 07/25/2021   CO2 24 07/25/2021   GLUCOSE 129 (H) 07/25/2021   BUN 13 07/25/2021   CREATININE 1.00 07/25/2021   CALCIUM 9.3 07/25/2021   MG 1.9 07/25/2021   Lab Results  Component Value Date   INR 1.8 (A) 09/11/2018   Lab Results  Component Value Date   CHOL 209 (H) 09/22/2020   HDL 41.30 09/22/2020   LDLCALC 62 09/22/2019   TRIG (H) 09/22/2020    508.0 Triglyceride is over 400; calculations on Lipids are invalid.     GEN- The patient is well appearing, alert and oriented x 3 today.   Head- normocephalic, atraumatic Eyes-  Sclera clear, conjunctiva pink Ears- hearing intact Oropharynx- clear Neck- supple, no JVP Lymph- no cervical lymphadenopathy Lungs- Clear to ausculation bilaterally, normal work of breathing Heart-slow, regular on presentation by ekg, but with regular rhythm on auscultation,  no murmurs, rubs or gallops, PMI not laterally displaced GI- soft, NT, ND, + BS Extremities- no clubbing, cyanosis, or edema MS- no significant deformity or atrophy Skin- no rash or lesion Psych- euthymic mood, full affect Neuro- strength and sensation are intact   Vent. rate 54 BPM PR interval 154 ms QRS duration 84 ms QT/QTcB 474/449 ms P-R-T axes 76 79 68 Sinus bradycardia Otherwise normal ECG When compared with ECG of 24-Jan-2021 09:25, QT has shortened Confirmed by Fransico Him ([id number]) on 07/25/2021 12:14:17 PM EKG-     Assessment and Plan: 1. Afib  Staying in rhythm for n most part with rare breakthrough afib  Continue dofetilide 250 mcg  bid and diltiazem 120 mg qd We  will assist pt to find new source to obtain drug  Bmet/mag today   2. CHA2DS2VASc score of 1 Continue eliquis 5 mg bid per pt's choice  3. HTN Stable   F/u in 6 months   Geroge Baseman. Shaliah Wann,  [facility] [address] [place], [place] [address] [phone]

## 2021-09-21 ENCOUNTER — Other Ambulatory Visit: Payer: Self-pay | Admitting: Internal Medicine

## 2021-09-21 NOTE — Telephone Encounter (Signed)
Please refill as per office routine med refill policy (all routine meds to be refilled for 3 mo or monthly (per pt preference) up to one year from last visit, then month to month grace period for 3 mo, then further med refills will have to be denied) ? ?

## 2021-09-25 ENCOUNTER — Ambulatory Visit (INDEPENDENT_AMBULATORY_CARE_PROVIDER_SITE_OTHER): Payer: 59 | Admitting: Internal Medicine

## 2021-09-25 ENCOUNTER — Encounter: Payer: Self-pay | Admitting: Internal Medicine

## 2021-09-25 VITALS — BP 110/68 | HR 47 | Temp 98.0°F | Ht 72.0 in | Wt 200.0 lb

## 2021-09-25 DIAGNOSIS — E559 Vitamin D deficiency, unspecified: Secondary | ICD-10-CM

## 2021-09-25 DIAGNOSIS — Z0000 Encounter for general adult medical examination without abnormal findings: Secondary | ICD-10-CM | POA: Diagnosis not present

## 2021-09-25 DIAGNOSIS — E785 Hyperlipidemia, unspecified: Secondary | ICD-10-CM

## 2021-09-25 DIAGNOSIS — Z0001 Encounter for general adult medical examination with abnormal findings: Secondary | ICD-10-CM | POA: Diagnosis not present

## 2021-09-25 DIAGNOSIS — R739 Hyperglycemia, unspecified: Secondary | ICD-10-CM | POA: Diagnosis not present

## 2021-09-25 DIAGNOSIS — I10 Essential (primary) hypertension: Secondary | ICD-10-CM

## 2021-09-25 LAB — CBC WITH DIFFERENTIAL/PLATELET
Basophils Absolute: 0 10*3/uL (ref 0.0–0.1)
Basophils Relative: 0.6 % (ref 0.0–3.0)
Eosinophils Absolute: 0.2 10*3/uL (ref 0.0–0.7)
Eosinophils Relative: 2.3 % (ref 0.0–5.0)
HCT: 42.8 % (ref 39.0–52.0)
Hemoglobin: 14.1 g/dL (ref 13.0–17.0)
Lymphocytes Relative: 26.4 % (ref 12.0–46.0)
Lymphs Abs: 2.1 10*3/uL (ref 0.7–4.0)
MCHC: 33 g/dL (ref 30.0–36.0)
MCV: 90.1 fl (ref 78.0–100.0)
Monocytes Absolute: 0.7 10*3/uL (ref 0.1–1.0)
Monocytes Relative: 9.1 % (ref 3.0–12.0)
Neutro Abs: 5 10*3/uL (ref 1.4–7.7)
Neutrophils Relative %: 61.6 % (ref 43.0–77.0)
Platelets: 209 10*3/uL (ref 150.0–400.0)
RBC: 4.75 Mil/uL (ref 4.22–5.81)
RDW: 13.7 % (ref 11.5–15.5)
WBC: 8.2 10*3/uL (ref 4.0–10.5)

## 2021-09-25 LAB — URINALYSIS, ROUTINE W REFLEX MICROSCOPIC
Bilirubin Urine: NEGATIVE
Hgb urine dipstick: NEGATIVE
Ketones, ur: NEGATIVE
Leukocytes,Ua: NEGATIVE
Nitrite: NEGATIVE
RBC / HPF: NONE SEEN (ref 0–?)
Specific Gravity, Urine: 1.01 (ref 1.000–1.030)
Total Protein, Urine: NEGATIVE
Urine Glucose: NEGATIVE
Urobilinogen, UA: 0.2 (ref 0.0–1.0)
pH: 5.5 (ref 5.0–8.0)

## 2021-09-25 LAB — BASIC METABOLIC PANEL
BUN: 18 mg/dL (ref 6–23)
CO2: 26 mEq/L (ref 19–32)
Calcium: 9.9 mg/dL (ref 8.4–10.5)
Chloride: 103 mEq/L (ref 96–112)
Creatinine, Ser: 1.12 mg/dL (ref 0.40–1.50)
GFR: 70.6 mL/min (ref >60.00)
Glucose, Bld: 118 mg/dL — ABNORMAL HIGH (ref 70–99)
Potassium: 4.3 mEq/L (ref 3.5–5.1)
Sodium: 137 mEq/L (ref 135–145)

## 2021-09-25 LAB — PSA: PSA: 1.01 ng/mL (ref 0.10–4.00)

## 2021-09-25 LAB — LIPID PANEL
Cholesterol: 146 mg/dL (ref 0–200)
HDL: 56.3 mg/dL (ref >39.00)
LDL Cholesterol: 57 mg/dL (ref 0–99)
NonHDL: 89.88
Total CHOL/HDL Ratio: 3
Triglycerides: 163 mg/dL — ABNORMAL HIGH (ref 0.0–149.0)
VLDL: 32.6 mg/dL (ref 0.0–40.0)

## 2021-09-25 LAB — TSH: TSH: 1.03 u[IU]/mL (ref 0.35–5.50)

## 2021-09-25 LAB — HEMOGLOBIN A1C: Hgb A1c MFr Bld: 6.1 % (ref 4.6–6.5)

## 2021-09-25 LAB — HEPATIC FUNCTION PANEL
ALT: 24 U/L (ref 0–53)
AST: 21 U/L (ref 0–37)
Albumin: 4.6 g/dL (ref 3.5–5.2)
Alkaline Phosphatase: 54 U/L (ref 39–117)
Bilirubin, Direct: 0.2 mg/dL (ref 0.0–0.3)
Total Bilirubin: 1.4 mg/dL — ABNORMAL HIGH (ref 0.2–1.2)
Total Protein: 7.8 g/dL (ref 6.0–8.3)

## 2021-09-25 LAB — VITAMIN D 25 HYDROXY (VIT D DEFICIENCY, FRACTURES): VITD: 40.59 ng/mL (ref 30.00–100.00)

## 2021-09-25 NOTE — Assessment & Plan Note (Signed)
Last vitamin D ?Lab Results  ?Component Value Date  ? VD25OH 24.16 (L) 09/22/2020  ? ?Low, to start oral replacement ? ?

## 2021-09-25 NOTE — Progress Notes (Signed)
Patient ID: [name] [name], male   DOB: November 10, 1960, 61 y.o.   MRN: [id number] ? ? ? ?     Chief Complaint:: wellness exam and low vit d, hld, hyperglycemia, htn ? ?     HPI:  [name] [name] is a 61 y.o. male here for wellness exam; declines covid booster o/w up to date ?         ?              Also not taking Vit d.  Pt denies chest pain, increased sob or doe, wheezing, orthopnea, PND, increased LE swelling, palpitations, dizziness or syncope.   Pt denies polydipsia, polyuria, or new focal neuro s/s.    Pt denies fever, wt loss, night sweats, loss of appetite, or other constitutional symptoms   ?Plans to do more exercise witih some wt gain recently.   ?Wt Readings from Last 3 Encounters:  ?09/25/21 200 lb (90.7 kg)  ?07/25/21 197 lb (89.4 kg)  ?03/28/21 195 lb (88.5 kg)  ? ?BP Readings from Last 3 Encounters:  ?09/25/21 110/68  ?07/25/21 134/84  ?03/28/21 (!) 141/88  ? ?Immunization History  ?Administered Date(s) Administered  ? Influenza Split 02/14/2011, 02/26/2012  ? Influenza, High Dose Seasonal PF 03/06/2020  ? Influenza, Seasonal, Injecte, Preservative Fre 04/03/2013  ? Influenza,inj,Quad PF,6+ Mos 04/20/2018, 03/03/2019  ? Influenza-Unspecified 04/01/2015, 03/02/2017, 03/09/2020  ? Moderna Sars-Covid-2 Vaccination 08/06/2019, 09/07/2019, 05/23/2020  ? Pneumococcal Conjugate-13 09/22/2019  ? Td 01/12/2009  ? Tdap 09/22/2019  ? Zoster Recombinat (Shingrix) 03/23/2020, 09/22/2020  ?There are no preventive care reminders to display for this patient. ?  ? ?Past Medical History:  ?Diagnosis Date  ? A-fib ([place])   ? on Eliquis  ? CHEST PAIN-PRECORDIAL 05/25/2009  ? ERECTILE DYSFUNCTION, ORGANIC 01/16/2010  ? GERD (gastroesophageal reflux disease)   ? hx of  ? HYPERLIPIDEMIA 12/15/2007  ? on meds  ? HYPERTENSION 12/15/2007  ? on meds  ? HYPOKALEMIA 12/15/2007  ? Internal hemorrhoids   ? Persistent atrial fibrillation ([place])   ? a. prior ablation/flecainide; placed on Tikosyn 07/2017.  ? POSTURAL LIGHTHEADEDNESS  07/06/2008  ? Seasonal allergies   ? Sinus bradycardia   ? Sleep apnea   ? does not use CPAP  ? Visit for monitoring Tikosyn therapy 07/03/2017  ? ?Past Surgical History:  ?Procedure Laterality Date  ? ATRIAL FIBRILLATION ABLATION N/A 08/13/2011  ? Procedure: ATRIAL FIBRILLATION ABLATION;  Surgeon: Thompson Grayer, MD;  Location: [facility] CATH LAB;  Service: Cardiovascular;  Laterality: N/A;  ? ATRIAL FIBRILLATION ABLATION  10/31/2016  ? ATRIAL FIBRILLATION ABLATION N/A 10/31/2016  ? Procedure: Atrial Fibrillation Ablation;  Surgeon: Thompson Grayer, MD;  Location: [place] CV LAB;  Service: Cardiovascular;  Laterality: N/A;  ? ATRIAL FLUTTER ABLATION N/A 12/27/2011  ? CTI repeat ablation by Dr Rayann Heman  ? CARDIOVERSION  11/21/2011  ? Procedure: CARDIOVERSION;  Surgeon: Larey Dresser, MD;  Location: [place];  Service: Cardiovascular;  Laterality: N/A;  ? CARDIOVERSION N/A 12/02/2016  ? Procedure: CARDIOVERSION;  Surgeon: [name], MD;  Location: [facility] ENDOSCOPY;  Service: Cardiovascular;  Laterality: N/A;  ? CHALAZION EXCISION Right   ? COLONOSCOPY  2017  ? SA-MAC-suprep (good)-TA x 2  ? ELBOW SURGERY Right early 1980s  ? bone spur/chip; "opened me up"  ? INGUINAL HERNIA REPAIR Right 11/2006  ? POLYPECTOMY  2017  ? TA x 2  ? TEE WITHOUT CARDIOVERSION  08/12/2011  ? Procedure: TRANSESOPHAGEAL ECHOCARDIOGRAM (TEE);  Surgeon: Fay Records, MD;  Location: [facility] ENDOSCOPY;  Service: Cardiovascular;  Laterality: N/A;  ? TEE WITHOUT CARDIOVERSION N/A 10/30/2016  ? Procedure: TRANSESOPHAGEAL ECHOCARDIOGRAM (TEE);  Surgeon: Josue Hector, MD;  Location: [facility] ENDOSCOPY;  Service: Cardiovascular;  Laterality: N/A;  ? TEE WITHOUT CARDIOVERSION N/A 12/02/2016  ? Procedure: TRANSESOPHAGEAL ECHOCARDIOGRAM (TEE);  Surgeon: [name], MD;  Location: [facility] ENDOSCOPY;  Service: Cardiovascular;  Laterality: N/A;  ? ? reports that he quit smoking about 10 years ago. His smoking use included cigarettes. He has a 1.50 pack-year smoking  history. He has never used smokeless tobacco. He reports current alcohol use of about 1.0 - 2.0 standard drink per week. He reports that he does not use drugs. ?family history includes Alcohol abuse in his father; Heart disease in his sister; Other in his father; Sleep apnea in his sister; Stroke in his mother; Thyroid disease in his sister. ?Allergies  ?Allergen Reactions  ? Ibuprofen Palpitations and Other (See Comments)  ?  Rapid heart beat  ? ?Current Outpatient Medications on File Prior to Visit  ?Medication Sig Dispense Refill  ? apixaban (ELIQUIS) 5 MG TABS tablet Take 1 tablet (5 mg total) by mouth 2 (two) times daily. 60 tablet 11  ? Arginine 500 MG CAPS Take 500 mg by mouth daily.     ? Ascorbic Acid (VITAMIN C) 1000 MG tablet Take 1,000 mg by mouth daily.     ? atorvastatin (LIPITOR) 20 MG tablet Take 1 tablet by mouth once daily 90 tablet 0  ? cholecalciferol (VITAMIN D3) 10 MCG (400 UNIT) TABS tablet Take 400 Units by mouth daily.     ? diltiazem (CARDIZEM CD) 120 MG 24 hr capsule Take 1 capsule (120 mg total) by mouth daily. 90 capsule 3  ? diltiazem (CARDIZEM) 30 MG tablet Take 1 tablet every 4 hours AS NEEDED for heart rate over 100 45 tablet 2  ? dofetilide (TIKOSYN) 250 MCG capsule Take 1 capsule (250 mcg total) by mouth 2 (two) times daily. 180 capsule 3  ? doxazosin (CARDURA) 4 MG tablet TAKE 1 TABLET BY MOUTH AT BEDTIME 90 tablet 0  ? fluticasone (FLONASE) 50 MCG/ACT nasal spray SMARTSIG:2 Spray(s) Both Nares Every Night    ? irbesartan (AVAPRO) 300 MG tablet Take 1 tablet (300 mg total) by mouth daily. 90 tablet 1  ? loratadine (CLARITIN) 10 MG tablet Take 1 tablet (10 mg total) by mouth daily. 30 tablet 11  ? Lysine HCl 500 MG TABS Take 500 mg by mouth daily.     ? Multiple Vitamins-Minerals (MULTIVITAL) tablet Take 1 tablet by mouth daily.    ? nitroGLYCERIN (NITROSTAT) 0.4 MG SL tablet Place 1 tablet (0.4 mg total) under the tongue every 5 (five) minutes as needed for chest pain. 30 tablet 0   ? ?No current facility-administered medications on file prior to visit.  ? ?     ROS:  All others reviewed and negative. ? ?Objective  ? ?     PE:  BP 110/68 (BP Location: Right Arm, Patient Position: Sitting, Cuff Size: Large)   Pulse (!) 47   Temp 98 ?F (36.7 ?C) (Oral)   Ht 6' (1.829 m)   Wt 200 lb (90.7 kg)   SpO2 98%   BMI 27.12 kg/m?  ? ?              Constitutional: Pt appears in NAD ?              HENT: Head: NCAT.  ?  Right Ear: External ear normal.   ?              Left Ear: External ear normal.  ?              Eyes: . Pupils are equal, round, and reactive to light. Conjunctivae and EOM are normal ?              Nose: without d/c or deformity ?              Neck: Neck supple. Gross normal ROM ?              Cardiovascular: Normal rate and regular rhythm.   ?              Pulmonary/Chest: Effort normal and breath sounds without rales or wheezing.  ?              Abd:  Soft, NT, ND, + BS, no organomegaly ?              Neurological: Pt is alert. At baseline orientation, motor grossly intact ?              Skin: Skin is warm. No rashes, no other new lesions, LE edema - none ?              Psychiatric: Pt behavior is normal without agitation  ? ?Micro: none ? ?Cardiac tracings I have personally interpreted today:  none ? ?Pertinent Radiological findings (summarize): none  ? ?Lab Results  ?Component Value Date  ? WBC 8.2 09/25/2021  ? HGB 14.1 09/25/2021  ? HCT 42.8 09/25/2021  ? PLT 209.0 09/25/2021  ? GLUCOSE 118 (H) 09/25/2021  ? CHOL 146 09/25/2021  ? TRIG 163.0 (H) 09/25/2021  ? HDL 56.30 09/25/2021  ? LDLDIRECT 53.0 09/22/2020  ? [place] 57 09/25/2021  ? ALT 24 09/25/2021  ? AST 21 09/25/2021  ? NA 137 09/25/2021  ? K 4.3 09/25/2021  ? CL 103 09/25/2021  ? CREATININE 1.12 09/25/2021  ? BUN 18 09/25/2021  ? CO2 26 09/25/2021  ? TSH 1.03 09/25/2021  ? PSA 1.01 09/25/2021  ? INR 1.8 (A) 09/11/2018  ? HGBA1C 6.1 09/25/2021  ? ?Assessment/Plan:  ?VONNIE LIGMAN is a 61 y.o. Black or  African American [2] male with  has a past medical history of A-fib [facility]), CHEST PAIN-PRECORDIAL (05/25/2009), ERECTILE DYSFUNCTION, ORGANIC (01/16/2010), GERD (gastroesophageal reflux disease), HYPERLIPIDEMIA (12/15/2007),

## 2021-09-25 NOTE — Patient Instructions (Addendum)

## 2021-09-29 ENCOUNTER — Encounter: Payer: Self-pay | Admitting: Internal Medicine

## 2021-09-29 NOTE — Assessment & Plan Note (Signed)
Lab Results  ?Component Value Date  ? HGBA1C 6.1 09/25/2021  ? ?Stable, pt to continue current medical treatment  - diet ? ?

## 2021-09-29 NOTE — Assessment & Plan Note (Signed)
Lab Results  ?Component Value Date  ? [place] 57 09/25/2021  ? ?Stable, pt to continue current statin lipitor ? ?

## 2021-09-29 NOTE — Assessment & Plan Note (Signed)

## 2021-09-29 NOTE — Assessment & Plan Note (Signed)
BP Readings from Last 3 Encounters:  ?09/25/21 110/68  ?07/25/21 134/84  ?03/28/21 (!) 141/88  ? ?Stable, pt to continue medical treatment cardizem, avapro ? ?

## 2021-10-11 ENCOUNTER — Other Ambulatory Visit: Payer: Self-pay | Admitting: Internal Medicine

## 2021-10-11 NOTE — Telephone Encounter (Signed)
Please refill as per office routine med refill policy (all routine meds to be refilled for 3 mo or monthly (per pt preference) up to one year from last visit, then month to month grace period for 3 mo, then further med refills will have to be denied) ? ?

## 2021-12-10 ENCOUNTER — Other Ambulatory Visit: Payer: Self-pay | Admitting: Internal Medicine

## 2021-12-10 NOTE — Telephone Encounter (Signed)
Please refill as per office routine med refill policy (all routine meds to be refilled for 3 mo or monthly (per pt preference) up to one year from last visit, then month to month grace period for 3 mo, then further med refills will have to be denied) ? ?

## 2021-12-27 ENCOUNTER — Other Ambulatory Visit: Payer: Self-pay | Admitting: Internal Medicine

## 2021-12-27 NOTE — Telephone Encounter (Signed)
Please refill as per office routine med refill policy (all routine meds to be refilled for 3 mo or monthly (per pt preference) up to one year from last visit, then month to month grace period for 3 mo, then further med refills will have to be denied) ? ?

## 2022-01-01 ENCOUNTER — Other Ambulatory Visit: Payer: Self-pay | Admitting: Internal Medicine

## 2022-01-01 NOTE — Telephone Encounter (Signed)
Please refill as per office routine med refill policy (all routine meds to be refilled for 3 mo or monthly (per pt preference) up to one year from last visit, then month to month grace period for 3 mo, then further med refills will have to be denied) ? ?

## 2022-03-04 ENCOUNTER — Other Ambulatory Visit: Payer: Self-pay | Admitting: Nurse Practitioner

## 2022-03-07 ENCOUNTER — Encounter: Payer: Self-pay | Admitting: Family Medicine

## 2022-03-07 ENCOUNTER — Telehealth (INDEPENDENT_AMBULATORY_CARE_PROVIDER_SITE_OTHER): Payer: 59 | Admitting: Family Medicine

## 2022-03-07 VITALS — BP 116/69 | Temp 97.3°F

## 2022-03-07 DIAGNOSIS — U071 COVID-19: Secondary | ICD-10-CM | POA: Diagnosis not present

## 2022-03-07 MED ORDER — BENZONATATE 200 MG PO CAPS: 200.0000 mg | ORAL_CAPSULE | Freq: Two times a day (BID) | ORAL | 0 refills | Status: DC | PRN

## 2022-03-07 MED ORDER — FLUTICASONE PROPIONATE 50 MCG/ACT NA SUSP: 2.0000 | Freq: Every day | NASAL | 6 refills | Status: DC

## 2022-03-07 MED ORDER — NIRMATRELVIR/RITONAVIR (PAXLOVID)TABLET: 3.0000 | ORAL_TABLET | Freq: Two times a day (BID) | ORAL | 0 refills | Status: AC

## 2022-03-07 NOTE — Patient Instructions (Signed)
For COVID, Quarantine at home for 5 days since the start of illness. After this you, may exit quarantine, but please still wear a mask indoors and outdoors for 5 more days.   Please be sure to drink plenty of fluids.   You may take the following OTC medications to help with symptoms:  For cough, use  cough syrups or other cough suppressants.  For headache, sore throat, fevers, muscle aches, chills, other pain, take tylenol, avoid ibuprofen  For congestion, use nasal sprays, decongestants, or antihistamines  Please follow up if no improvement.   Go to ED if you have severe chest pain, fevers, shortness of breath or other worrisome symptoms.

## 2022-03-07 NOTE — Progress Notes (Signed)
Virtual Visit via Video Note  I connected with [name] [name] on 03/10/22 at 10:20 AM EDT by a video enabled telemedicine application and verified that I am speaking with the correct person using two identifiers.  Location: Patient: Home Provider: Office   I discussed the limitations of evaluation and management by telemedicine and the availability of in person appointments. The patient expressed understanding and agreed to proceed.  History of Present Illness:   COVID19. He complains of congestion, cough described as productive, sore throat, and burning in eyes with redness.with no fever, chills, night sweats or weight loss. Onset of symptoms was yesterday and gradually worsening.He is drinking plenty of fluids.  Patient tested negative for COVID today.  Treatment to date: zicam, unsure if helped. Scared to take  Past history is significant for Afib. Patient is former smoker, quit 10 years ago. COVID Vaccination status: Original series: Yes Moderna, Booster: Yes 2022. Wife tested positive 1 day ago. Mild shortness of breath.   ROS: The patient denies chest pain,  wheezing or hemoptysis.   Observations/Objective: Gen: NAD, resting comfortably HEENT: EOMI Pulm: NWOB Skin: no rash on face Neuro: no facial asymmetry or dysmetria Psych: Normal affect  Assessment and Plan: COVID-19 based on history and recent exposure  Problem List Items Addressed This Visit   None Visit Diagnoses     COVID-19    -  Primary   Relevant Medications   nirmatrelvir/ritonavir EUA (PAXLOVID) 20 x 150 MG & 10 x '100MG'$  TABS   benzonatate (TESSALON) 200 MG capsule   fluticasone (FLONASE) 50 MCG/ACT nasal spray       Follow Up Instructions:    I discussed the assessment and treatment plan with the patient. The patient was provided an opportunity to ask questions and all were answered. The patient agreed with the plan and demonstrated an understanding of the instructions.   The patient was advised to  call back or seek an in-person evaluation if the symptoms worsen or if the condition fails to improve as anticipated.  I provided 20 minutes of non-face-to-face time during this encounter.   Bonnita Hollow, MD

## 2022-03-11 ENCOUNTER — Telehealth: Payer: Self-pay

## 2022-03-11 MED ORDER — ALBUTEROL SULFATE HFA 108 (90 BASE) MCG/ACT IN AERS: 2.0000 | INHALATION_SPRAY | Freq: Four times a day (QID) | RESPIRATORY_TRACT | 0 refills | Status: DC | PRN

## 2022-03-11 NOTE — Addendum Note (Signed)
Addended by: Biagio Borg on: 03/11/2022 04:43 PM   Modules accepted: Orders

## 2022-03-11 NOTE — Telephone Encounter (Signed)
Patient is calling in stating he seen Dr.Morrison on 10/5. He tested positive on Saturday and is continuing medication that was prescribed but has noticed that he is having some SOB and fatigue. Wants to know if he can get anything prescribed for it. Also will need a return back to work note but would like to know when it is recommended for him to return.

## 2022-03-11 NOTE — Telephone Encounter (Signed)
Noted  

## 2022-03-11 NOTE — Telephone Encounter (Signed)
See below

## 2022-03-11 NOTE — Telephone Encounter (Signed)
Please advise 

## 2022-03-11 NOTE — Telephone Encounter (Signed)
Ok for albuterol inhaler prn - done erx

## 2022-03-27 ENCOUNTER — Other Ambulatory Visit: Payer: Self-pay | Admitting: Nurse Practitioner

## 2022-04-17 ENCOUNTER — Telehealth: Payer: Self-pay | Admitting: Internal Medicine

## 2022-04-17 NOTE — Telephone Encounter (Signed)
Caller Name: Kamarie Call back phone #: [phone]   MEDICATION(S):  loratadine (CLARITIN) 10 MG tablet   Days of Med Remaining:   Has the patient contacted their pharmacy (YES/NO)? Yes, he said they told him the sent over refill several times What did pharmacy advise?   Preferred Pharmacy:  [facility] on [address]  ~~~Please advise patient/caregiver to allow 2-3 business days to process RX refills.

## 2022-04-18 MED ORDER — LORATADINE 10 MG PO TABS: 10.0000 mg | ORAL_TABLET | Freq: Every day | ORAL | 1 refills | Status: DC

## 2022-04-18 NOTE — Telephone Encounter (Signed)
Sent loratadine to pof.Marland KitchenJohny Chess

## 2022-05-08 ENCOUNTER — Ambulatory Visit (HOSPITAL_COMMUNITY)
Admission: RE | Admit: 2022-05-08 | Discharge: 2022-05-08 | Disposition: A | Discharge status: Home / Self Care | Payer: 59 | Source: Ambulatory Visit | Attending: Nurse Practitioner | Admitting: Nurse Practitioner

## 2022-05-08 ENCOUNTER — Encounter (HOSPITAL_COMMUNITY): Payer: Self-pay | Admitting: Nurse Practitioner

## 2022-05-08 VITALS — BP 122/78 | HR 45 | Ht 72.0 in | Wt 196.8 lb

## 2022-05-08 DIAGNOSIS — I4892 Unspecified atrial flutter: Secondary | ICD-10-CM | POA: Diagnosis not present

## 2022-05-08 DIAGNOSIS — I480 Paroxysmal atrial fibrillation: Secondary | ICD-10-CM | POA: Diagnosis not present

## 2022-05-08 DIAGNOSIS — D6869 Other thrombophilia: Secondary | ICD-10-CM

## 2022-05-08 LAB — MAGNESIUM: Magnesium: 2.1 mg/dL (ref 1.7–2.4)

## 2022-05-08 LAB — BASIC METABOLIC PANEL
Anion gap: 4 — ABNORMAL LOW (ref 5–15)
BUN: 23 mg/dL (ref 8–23)
CO2: 24 mmol/L (ref 22–32)
Calcium: 9.5 mg/dL (ref 8.9–10.3)
Chloride: 111 mmol/L (ref 98–111)
Creatinine, Ser: 1.16 mg/dL (ref 0.61–1.24)
GFR, Estimated: >60 mL/min (ref >60)
Glucose, Bld: 104 mg/dL — ABNORMAL HIGH (ref 70–99)
Potassium: 4.1 mmol/L (ref 3.5–5.1)
Sodium: 139 mmol/L (ref 135–145)

## 2022-05-08 NOTE — Progress Notes (Signed)
Primary Care Physician: Biagio Borg, MD Referring Physician: Luana Shu is a 61 y.o. male with a h/o afib in the afib clinic for Tikosyn surveillance. He reports that he has not noted any sustained afib. He will have an infrequent breakthrough episode, rarely lasts more than one day.  He is being compliant with  Tikosyn. and  eliquis with a CHA2DS2VASc score of 1. No bleeding issues.  He presented in afib this am, but by auscultation was back in SR by end of the appointment. He has had his covid shots this  last spring.   F/u in  afib clinic, 07/12/20. He has been maintaining SR but feels draggy in the am. His BP is soft in the am and he may not need the low dose amlodipine. He continues on tikosyn and eliquis.   F/u in afib clinic, 01/24/21 for Tikosyn surveillance.  He presented for a colonoscopy last week and was in afib with RVR so the procedure was cancelled and referred back here. He reports that he returned to SR later that day. He has h/o of intermittent breakthrough episodes that last less than 24 hours. He is being compliant with Tikosyn and eliqiuis 5 mg bid. He states that he had to drink the solution for clean out the evening before and again the next morning. He took his meds around 9 am but had the feeling he was in afib before he left the house for the procedure that afternoon. Probably, he had poor absorption of meds with the continuation of washout  prep the next morning.   Seeing back in afib clinic 07/25/21 for Tikosyn surveillance. He had an episode 07/12/21 that lasted 2 days triggered by work stress. He is in sinus brady today. [facility] notified pt that they will no longer be carrying dofetilide. We will assist pt to find another avenue to obtain drug.  Qt stable.   F/u in afib clinic, 05/08/22. He is doing well staying in [place]. He did have COVID one month ago and feels he is recovered. Compliant with tikosyn.   Today, he denies symptoms of palpitations, chest  pain, shortness of breath, orthopnea, PND, lower extremity edema, dizziness, presyncope, syncope, or neurologic sequela. The patient is tolerating medications without difficulties and is otherwise without complaint today.   Past Medical History:  Diagnosis Date   A-fib [facility])    on Eliquis   CHEST PAIN-PRECORDIAL 05/25/2009   ERECTILE DYSFUNCTION, ORGANIC 01/16/2010   GERD (gastroesophageal reflux disease)    hx of   HYPERLIPIDEMIA 12/15/2007   on meds   HYPERTENSION 12/15/2007   on meds   HYPOKALEMIA 12/15/2007   Internal hemorrhoids    Persistent atrial fibrillation ([place])    a. prior ablation/flecainide; placed on Tikosyn 07/2017.   POSTURAL LIGHTHEADEDNESS 07/06/2008   Seasonal allergies    Sinus bradycardia    Sleep apnea    does not use CPAP   Visit for monitoring Tikosyn therapy 07/03/2017   Past Surgical History:  Procedure Laterality Date   ATRIAL FIBRILLATION ABLATION N/A 08/13/2011   Procedure: ATRIAL FIBRILLATION ABLATION;  Surgeon: Thompson Grayer, MD;  Location: [facility] CATH LAB;  Service: Cardiovascular;  Laterality: N/A;   ATRIAL FIBRILLATION ABLATION  10/31/2016   ATRIAL FIBRILLATION ABLATION N/A 10/31/2016   Procedure: Atrial Fibrillation Ablation;  Surgeon: Thompson Grayer, MD;  Location: [place] CV LAB;  Service: Cardiovascular;  Laterality: N/A;   ATRIAL FLUTTER ABLATION N/A 12/27/2011   CTI repeat ablation by Dr  Allred   CARDIOVERSION  11/21/2011   Procedure: CARDIOVERSION;  Surgeon: Larey Dresser, MD;  Location: [place];  Service: Cardiovascular;  Laterality: N/A;   CARDIOVERSION N/A 12/02/2016   Procedure: CARDIOVERSION;  Surgeon: [name], MD;  Location: [facility] ENDOSCOPY;  Service: Cardiovascular;  Laterality: N/A;   CHALAZION EXCISION Right    COLONOSCOPY  2017   SA-MAC-suprep (good)-TA x 2   ELBOW SURGERY Right early 1980s   bone spur/chip; "opened me up"   INGUINAL HERNIA REPAIR Right 11/2006   POLYPECTOMY  2017   TA x 2   TEE WITHOUT CARDIOVERSION   08/12/2011   Procedure: TRANSESOPHAGEAL ECHOCARDIOGRAM (TEE);  Surgeon: Fay Records, MD;  Location: [facility] ENDOSCOPY;  Service: Cardiovascular;  Laterality: N/A;   TEE WITHOUT CARDIOVERSION N/A 10/30/2016   Procedure: TRANSESOPHAGEAL ECHOCARDIOGRAM (TEE);  Surgeon: Josue Hector, MD;  Location: [facility] ENDOSCOPY;  Service: Cardiovascular;  Laterality: N/A;   TEE WITHOUT CARDIOVERSION N/A 12/02/2016   Procedure: TRANSESOPHAGEAL ECHOCARDIOGRAM (TEE);  Surgeon: [name], MD;  Location: [facility] ENDOSCOPY;  Service: Cardiovascular;  Laterality: N/A;    Current Outpatient Medications  Medication Sig Dispense Refill   albuterol (VENTOLIN HFA) 108 (90 Base) MCG/ACT inhaler Inhale 2 puffs into the lungs every 6 (six) hours as needed for wheezing or shortness of breath. 8 g 0   Arginine 500 MG CAPS Take 500 mg by mouth daily.     Ascorbic Acid (VITAMIN C) 1000 MG tablet Take 1,000 mg by mouth daily.      atorvastatin (LIPITOR) 20 MG tablet Take 1 tablet by mouth once daily 90 tablet 2   diltiazem (CARDIZEM CD) 120 MG 24 hr capsule Take 1 capsule by mouth once daily 90 capsule 2   diltiazem (CARDIZEM) 30 MG tablet Take 1 tablet every 4 hours AS NEEDED for heart rate over 100 45 tablet 2   dofetilide (TIKOSYN) 250 MCG capsule Take 1 capsule (250 mcg total) by mouth 2 (two) times daily. 180 capsule 3   doxazosin (CARDURA) 4 MG tablet TAKE 1 TABLET BY MOUTH AT BEDTIME 90 tablet 2   ELIQUIS 5 MG TABS tablet TAKE 1 TABLET BY MOUTH TWICE DAILY. APPOINTMENT REQUIRED FOR FURTHER REFILLS [phone]. 60 tablet 5   fluticasone (FLONASE) 50 MCG/ACT nasal spray Place 2 sprays into both nostrils daily. 16 g 6   irbesartan (AVAPRO) 300 MG tablet Take 1 tablet by mouth once daily 90 tablet 3   loratadine (CLARITIN) 10 MG tablet Take 1 tablet (10 mg total) by mouth daily. 90 tablet 1   Lysine HCl 500 MG TABS Take 500 mg by mouth daily.      Multiple Vitamins-Minerals (MULTIVITAL) tablet Take 1 tablet by mouth daily.      nitroGLYCERIN (NITROSTAT) 0.4 MG SL tablet Place 1 tablet (0.4 mg total) under the tongue every 5 (five) minutes as needed for chest pain. 30 tablet 0   cholecalciferol (VITAMIN D3) 10 MCG (400 UNIT) TABS tablet Take 400 Units by mouth daily.  (Patient not taking: Reported on 05/08/2022)     No current facility-administered medications for this encounter.    Allergies  Allergen Reactions   Ibuprofen Palpitations and Other (See Comments)    Rapid heart beat    Social History   Socioeconomic History   Marital status: Married    Spouse name: Not on file   Number of children: 2   Years of education: Not on file   Highest education level: Not on file  Occupational History  Occupation: SECURITY    Employer: [employer]  Tobacco Use   Smoking status: Former    Packs/day: 0.10    Years: 15.00    Total pack years: 1.50    Types: Cigarettes    Quit date: 12/17/2010    Years since quitting: 11.3   Smokeless tobacco: Never   Tobacco comments:    Smoked once in a while. not a daily smoker  Vaping Use   Vaping Use: Never used  Substance and Sexual Activity   Alcohol use: Yes    Alcohol/week: 1.0 - 2.0 standard drink of alcohol    Types: 1 - 2 Standard drinks or equivalent per week   Drug use: No   Sexual activity: Not Currently  Other Topics Concern   Not on file  Social History Narrative   Pt lives in [place] with spouse. 2 grown children.   Works at [employer] in [place].   Social Determinants of Health   Financial Resource Strain: Not on file  Food Insecurity: Not on file  Transportation Needs: Not on file  Physical Activity: Not on file  Stress: Not on file  Social Connections: Not on file  Intimate Partner Violence: Not on file    Family History  Problem Relation Age of Onset   Stroke Mother    Other Father        deceased stomach hemorrhage- alcohol use/abuse   Alcohol abuse Father    Heart disease Sister    Thyroid disease Sister     Sleep apnea Sister    Anesthesia problems Neg Hx    Esophageal cancer Neg Hx    Colon cancer Neg Hx    Rectal cancer Neg Hx    Stomach cancer Neg Hx    Colon polyps Neg Hx     ROS- All systems are reviewed and negative except as per the HPI above  Physical Exam: Vitals:   05/08/22 1052  BP: 122/78  Pulse: (!) 45  Weight: 89.3 kg  Height: 6' (1.829 m)   Wt Readings from Last 3 Encounters:  05/08/22 89.3 kg  09/25/21 90.7 kg  07/25/21 89.4 kg    Labs: Lab Results  Component Value Date   NA 137 09/25/2021   K 4.3 09/25/2021   CL 103 09/25/2021   CO2 26 09/25/2021   GLUCOSE 118 (H) 09/25/2021   BUN 18 09/25/2021   CREATININE 1.12 09/25/2021   CALCIUM 9.9 09/25/2021   MG 1.9 07/25/2021   Lab Results  Component Value Date   INR 1.8 (A) 09/11/2018   Lab Results  Component Value Date   CHOL 146 09/25/2021   HDL 56.30 09/25/2021   LDLCALC 57 09/25/2021   TRIG 163.0 (H) 09/25/2021     GEN- The patient is well appearing, alert and oriented x 3 today.   Head- normocephalic, atraumatic Eyes-  Sclera clear, conjunctiva pink Ears- hearing intact Oropharynx- clear Neck- supple, no JVP Lymph- no cervical lymphadenopathy Lungs- Clear to ausculation bilaterally, normal work of breathing Heart-slow, regular on presentation by ekg, but with regular rhythm on auscultation,  no murmurs, rubs or gallops, PMI not laterally displaced GI- soft, NT, ND, + BS Extremities- no clubbing, cyanosis, or edema MS- no significant deformity or atrophy Skin- no rash or lesion Psych- euthymic mood, full affect Neuro- strength and sensation are intact   Vent. rate 54 BPM PR interval 154 ms QRS duration 84 ms QT/QTcB 474/449 ms P-R-T axes 76 79 68 Sinus bradycardia Otherwise normal ECG When compared  with ECG of 24-Jan-2021 09:25, QT has shortened Confirmed by Fransico Him ([id number]) on 07/25/2021 12:14:17 PM EKG-     Assessment and Plan: 1. Afib  Staying in rhythm for n most part  with rare breakthrough afib  Continue dofetilide 250 mcg  bid and diltiazem 120 mg qd Bmet/mag today   2. CHA2DS2VASc score of 1 Continue eliquis 5 mg bid per pt's choice  3. HTN Stable   F/u in 6 months   Geroge Baseman. Estel Tonelli, [facility] [address] [place], [place] [address] [phone]

## 2022-05-30 DIAGNOSIS — N5201 Erectile dysfunction due to arterial insufficiency: Secondary | ICD-10-CM | POA: Diagnosis not present

## 2022-05-30 DIAGNOSIS — N401 Enlarged prostate with lower urinary tract symptoms: Secondary | ICD-10-CM | POA: Diagnosis not present

## 2022-05-30 DIAGNOSIS — R350 Frequency of micturition: Secondary | ICD-10-CM | POA: Diagnosis not present

## 2022-06-12 ENCOUNTER — Encounter (HOSPITAL_COMMUNITY): Payer: Self-pay | Admitting: Emergency Medicine

## 2022-06-12 ENCOUNTER — Ambulatory Visit (HOSPITAL_COMMUNITY)
Admission: EM | Admit: 2022-06-12 | Discharge: 2022-06-12 | Disposition: A | Discharge status: Home / Self Care | Payer: 59 | Attending: Family Medicine | Admitting: Family Medicine

## 2022-06-12 DIAGNOSIS — I10 Essential (primary) hypertension: Secondary | ICD-10-CM | POA: Diagnosis not present

## 2022-06-12 DIAGNOSIS — R519 Headache, unspecified: Secondary | ICD-10-CM | POA: Diagnosis not present

## 2022-06-12 MED ORDER — CLONIDINE HCL 0.1 MG PO TABS
0.1000 mg | ORAL_TABLET | Freq: Once | ORAL | Status: AC
  Administered 2022-06-12: 0.1 mg via ORAL

## 2022-06-12 MED ORDER — CLONIDINE HCL 0.1 MG PO TABS
ORAL_TABLET | ORAL | Status: AC
  Filled 2022-06-12: qty 1

## 2022-06-12 NOTE — ED Triage Notes (Signed)
Had headaches for a couple days. Denies taking any medications for pain.  Reports yesterday when got home checked BP which was high 180s/100s. Reports HTN and takes medications for it/ Today 140/84

## 2022-06-12 NOTE — Discharge Instructions (Addendum)
BP 138/88 (BP Location: Left Arm)   Pulse (!) 50   Temp 98.1 F (36.7 C) (Oral)   Resp 16   SpO2 98%   Meds ordered this encounter  Medications   cloNIDine (CATAPRES) tablet 0.1 mg

## 2022-06-15 NOTE — ED Provider Notes (Signed)
[place]   [id number] 06/12/22 Arrival Time: 1618  ASSESSMENT & PLAN:  1. Elevated blood pressure reading in office with diagnosis of hypertension   2. Nonintractable headache, unspecified chronicity pattern, unspecified headache type    Blood pressure 138/88, pulse (!) 50, temperature 98.1 F (36.7 C), temperature source Oral, resp. rate 16, SpO2 98 %.  Meds ordered this encounter  Medications   cloNIDine (CATAPRES) tablet 0.1 mg  Even with slight redution in BP he reports HA is much better. Comfortable with home observation. Has BP cuff at home to monitor.   Follow-up Information     Schedule an appointment as soon as possible for a visit  with Biagio Borg, MD.   Specialties: Internal Medicine, Radiology Why: For follow up. Contact information: [place] [address] [phone]         [place].   Specialty: Emergency Medicine Why: If symptoms worsen in any way. Contact information: [address] [id number] [place] [phone]                Reviewed expectations re: course of current medical issues. Questions answered. Outlined signs and symptoms indicating need for more acute intervention. Patient verbalized understanding. After Visit Summary given.   SUBJECTIVE:  [name] [name] is a 62 y.o. male who presents with concerns regarding increased blood pressures. Reports that he is treated for hypertension. Had headaches for a couple days. Denies taking any medications for pain.  Reports yesterday when got home checked BP which was high 180s/100s. Reports HTN and takes medications for it. Today 140/84. Mild HA now; frontal; no assoc n/v/visual changes. No extremity sensation changes or weakness.   He reports taking medications as instructed, no chest pain on exertion, no dyspnea on exertion, no swelling of ankles, no orthostatic dizziness or  lightheadedness, no orthopnea or paroxysmal nocturnal dyspnea, and no palpitations.   Social History   Tobacco Use  Smoking Status Former   Packs/day: 0.10   Years: 15.00   Total pack years: 1.50   Types: Cigarettes   Quit date: 12/17/2010   Years since quitting: 11.5  Smokeless Tobacco Never  Tobacco Comments   Smoked once in a while. not a daily smoker     OBJECTIVE:  Vitals:   06/12/22 1644 06/12/22 1806  BP: (!) 146/87 138/88  Pulse: (!) 59 (!) 50  Resp: 17 16  Temp: 98.1 F (36.7 C)   TempSrc: Oral   SpO2: 98%     General appearance: alert; no distress Eyes: PERRLA; EOMI HENT: normocephalic; atraumatic Neck: supple Lungs: clear to auscultation bilaterally Heart: regular rate and rhythm without murmer Abdomen: soft, non-tender; bowel sounds normal Extremities: no edema; symmetrical with no gross deformities Skin: warm and dry Neuro: CN 2-12 grossly intact; normal ext strength and sensation; normal gait Psychological: alert and cooperative; normal mood and affect  ECG: Orders placed or performed during the hospital encounter of 05/08/22   EKG 12-Lead   EKG 12-Lead      Allergies  Allergen Reactions   Ibuprofen Palpitations and Other (See Comments)    Rapid heart beat    Past Medical History:  Diagnosis Date   A-fib ([place])    on Eliquis   CHEST PAIN-PRECORDIAL 05/25/2009   ERECTILE DYSFUNCTION, ORGANIC 01/16/2010   GERD (gastroesophageal reflux disease)    hx of   HYPERLIPIDEMIA 12/15/2007   on meds   HYPERTENSION 12/15/2007   on meds  HYPOKALEMIA 12/15/2007   Internal hemorrhoids    Persistent atrial fibrillation ([place])    a. prior ablation/flecainide; placed on Tikosyn 07/2017.   POSTURAL LIGHTHEADEDNESS 07/06/2008   Seasonal allergies    Sinus bradycardia    Sleep apnea    does not use CPAP   Visit for monitoring Tikosyn therapy 07/03/2017   Social History   Socioeconomic History   Marital status: Married    Spouse name: Not on  file   Number of children: 2   Years of education: Not on file   Highest education level: Not on file  Occupational History   Occupation: SECURITY    Employer: [employer]  Tobacco Use   Smoking status: Former    Packs/day: 0.10    Years: 15.00    Total pack years: 1.50    Types: Cigarettes    Quit date: 12/17/2010    Years since quitting: 11.5   Smokeless tobacco: Never   Tobacco comments:    Smoked once in a while. not a daily smoker  Vaping Use   Vaping Use: Never used  Substance and Sexual Activity   Alcohol use: Yes    Alcohol/week: 1.0 - 2.0 standard drink of alcohol    Types: 1 - 2 Standard drinks or equivalent per week   Drug use: No   Sexual activity: Not Currently  Other Topics Concern   Not on file  Social History Narrative   Pt lives in [place] with spouse. 2 grown children.   Works at [employer] in [place].   Social Determinants of Health   Financial Resource Strain: Not on file  Food Insecurity: Not on file  Transportation Needs: Not on file  Physical Activity: Not on file  Stress: Not on file  Social Connections: Not on file  Intimate Partner Violence: Not on file   Family History  Problem Relation Age of Onset   Stroke Mother    Other Father        deceased stomach hemorrhage- alcohol use/abuse   Alcohol abuse Father    Heart disease Sister    Thyroid disease Sister    Sleep apnea Sister    Anesthesia problems Neg Hx    Esophageal cancer Neg Hx    Colon cancer Neg Hx    Rectal cancer Neg Hx    Stomach cancer Neg Hx    Colon polyps Neg Hx    Past Surgical History:  Procedure Laterality Date   ATRIAL FIBRILLATION ABLATION N/A 08/13/2011   Procedure: ATRIAL FIBRILLATION ABLATION;  Surgeon: Thompson Grayer, MD;  Location: [facility] CATH LAB;  Service: Cardiovascular;  Laterality: N/A;   ATRIAL FIBRILLATION ABLATION  10/31/2016   ATRIAL FIBRILLATION ABLATION N/A 10/31/2016   Procedure: Atrial Fibrillation Ablation;  Surgeon: Thompson Grayer, MD;  Location: [facility] CV LAB;  Service: Cardiovascular;  Laterality: N/A;   ATRIAL FLUTTER ABLATION N/A 12/27/2011   CTI repeat ablation by Dr Rayann Heman   CARDIOVERSION  11/21/2011   Procedure: CARDIOVERSION;  Surgeon: Larey Dresser, MD;  Location: [facility];  Service: Cardiovascular;  Laterality: N/A;   CARDIOVERSION N/A 12/02/2016   Procedure: CARDIOVERSION;  Surgeon: [name], MD;  Location: [facility] ENDOSCOPY;  Service: Cardiovascular;  Laterality: N/A;   CHALAZION EXCISION Right    COLONOSCOPY  2017   SA-MAC-suprep (good)-TA x 2   ELBOW SURGERY Right early 1980s   bone spur/chip; "opened me up"   INGUINAL HERNIA REPAIR Right 11/2006   POLYPECTOMY  2017   TA x 2  TEE WITHOUT CARDIOVERSION  08/12/2011   Procedure: TRANSESOPHAGEAL ECHOCARDIOGRAM (TEE);  Surgeon: Fay Records, MD;  Location: [facility] ENDOSCOPY;  Service: Cardiovascular;  Laterality: N/A;   TEE WITHOUT CARDIOVERSION N/A 10/30/2016   Procedure: TRANSESOPHAGEAL ECHOCARDIOGRAM (TEE);  Surgeon: Josue Hector, MD;  Location: [facility] ENDOSCOPY;  Service: Cardiovascular;  Laterality: N/A;   TEE WITHOUT CARDIOVERSION N/A 12/02/2016   Procedure: TRANSESOPHAGEAL ECHOCARDIOGRAM (TEE);  Surgeon: [name], MD;  Location: [facility];  Service: Cardiovascular;  Laterality: N/AVanessa Kick, MD 06/15/22 1017

## 2022-06-17 ENCOUNTER — Ambulatory Visit (INDEPENDENT_AMBULATORY_CARE_PROVIDER_SITE_OTHER): Payer: 59 | Admitting: Internal Medicine

## 2022-06-17 ENCOUNTER — Encounter: Payer: Self-pay | Admitting: Internal Medicine

## 2022-06-17 VITALS — BP 118/72 | HR 67 | Temp 98.2°F | Ht 72.0 in | Wt 199.0 lb

## 2022-06-17 DIAGNOSIS — E559 Vitamin D deficiency, unspecified: Secondary | ICD-10-CM

## 2022-06-17 DIAGNOSIS — R739 Hyperglycemia, unspecified: Secondary | ICD-10-CM | POA: Diagnosis not present

## 2022-06-17 DIAGNOSIS — E785 Hyperlipidemia, unspecified: Secondary | ICD-10-CM | POA: Diagnosis not present

## 2022-06-17 DIAGNOSIS — I10 Essential (primary) hypertension: Secondary | ICD-10-CM | POA: Diagnosis not present

## 2022-06-17 DIAGNOSIS — J3489 Other specified disorders of nose and nasal sinuses: Secondary | ICD-10-CM

## 2022-06-17 DIAGNOSIS — E538 Deficiency of other specified B group vitamins: Secondary | ICD-10-CM | POA: Diagnosis not present

## 2022-06-17 DIAGNOSIS — Z125 Encounter for screening for malignant neoplasm of prostate: Secondary | ICD-10-CM

## 2022-06-17 LAB — HEPATIC FUNCTION PANEL
ALT: 29 U/L (ref 0–53)
AST: 21 U/L (ref 0–37)
Albumin: 4.4 g/dL (ref 3.5–5.2)
Alkaline Phosphatase: 54 U/L (ref 39–117)
Bilirubin, Direct: 0.2 mg/dL (ref 0.0–0.3)
Total Bilirubin: 0.9 mg/dL (ref 0.2–1.2)
Total Protein: 7.6 g/dL (ref 6.0–8.3)

## 2022-06-17 LAB — LIPID PANEL
Cholesterol: 143 mg/dL (ref 0–200)
HDL: 64.1 mg/dL (ref >39.00)
NonHDL: 78.42
Total CHOL/HDL Ratio: 2
Triglycerides: 204 mg/dL — ABNORMAL HIGH (ref 0.0–149.0)
VLDL: 40.8 mg/dL — ABNORMAL HIGH (ref 0.0–40.0)

## 2022-06-17 LAB — CBC WITH DIFFERENTIAL/PLATELET
Basophils Absolute: 0.1 10*3/uL (ref 0.0–0.1)
Basophils Relative: 0.6 % (ref 0.0–3.0)
Eosinophils Absolute: 0.1 10*3/uL (ref 0.0–0.7)
Eosinophils Relative: 1.3 % (ref 0.0–5.0)
HCT: 47.5 % (ref 39.0–52.0)
Hemoglobin: 15.9 g/dL (ref 13.0–17.0)
Lymphocytes Relative: 20.4 % (ref 12.0–46.0)
Lymphs Abs: 2.2 10*3/uL (ref 0.7–4.0)
MCHC: 33.4 g/dL (ref 30.0–36.0)
MCV: 89.4 fl (ref 78.0–100.0)
Monocytes Absolute: 1.1 10*3/uL — ABNORMAL HIGH (ref 0.1–1.0)
Monocytes Relative: 10 % (ref 3.0–12.0)
Neutro Abs: 7.2 10*3/uL (ref 1.4–7.7)
Neutrophils Relative %: 67.7 % (ref 43.0–77.0)
Platelets: 249 10*3/uL (ref 150.0–400.0)
RBC: 5.31 Mil/uL (ref 4.22–5.81)
RDW: 13.6 % (ref 11.5–15.5)
WBC: 10.6 10*3/uL — ABNORMAL HIGH (ref 4.0–10.5)

## 2022-06-17 LAB — VITAMIN B12: Vitamin B-12: 1070 pg/mL — ABNORMAL HIGH (ref 211–911)

## 2022-06-17 LAB — BASIC METABOLIC PANEL
BUN: 23 mg/dL (ref 6–23)
CO2: 24 mEq/L (ref 19–32)
Calcium: 9.8 mg/dL (ref 8.4–10.5)
Chloride: 106 mEq/L (ref 96–112)
Creatinine, Ser: 1.16 mg/dL (ref 0.40–1.50)
GFR: 67.35 mL/min (ref >60.00)
Glucose, Bld: 99 mg/dL (ref 70–99)
Potassium: 4.2 mEq/L (ref 3.5–5.1)
Sodium: 138 mEq/L (ref 135–145)

## 2022-06-17 LAB — HEMOGLOBIN A1C: Hgb A1c MFr Bld: 6 % (ref 4.6–6.5)

## 2022-06-17 LAB — TSH: TSH: 0.64 u[IU]/mL (ref 0.35–5.50)

## 2022-06-17 LAB — PSA: PSA: 1 ng/mL (ref 0.10–4.00)

## 2022-06-17 LAB — VITAMIN D 25 HYDROXY (VIT D DEFICIENCY, FRACTURES): VITD: 27.81 ng/mL — ABNORMAL LOW (ref 30.00–100.00)

## 2022-06-17 NOTE — Patient Instructions (Addendum)
Please continue all other medications as before, including the flonase  Please have the pharmacy call with any other refills you may need.  Please continue your efforts at being more active, low cholesterol diet, and weight control..  Please keep your appointments with your specialists as you may have planned - cardiology  You will be contacted regarding the referral for: ENT  Please make an Appointment to return in Apr 30, or sooner if needed, also with Lab Appointment for testing done 3-5 days before at the [facility] (so this is for TWO appointments - please see the scheduling desk as you leave)

## 2022-06-17 NOTE — Assessment & Plan Note (Signed)
Lab Results  Component Value Date   HGBA1C 6.1 09/25/2021   Stable, pt to continue current medical treatment  - diet, wt control

## 2022-06-17 NOTE — Assessment & Plan Note (Signed)
Lab Results  Component Value Date   LDLCALC 57 09/25/2021   Stable, pt to continue current statin lipitor 20 mg qd

## 2022-06-17 NOTE — Assessment & Plan Note (Addendum)
For 1 yr, now worsening without fever, but with swelling nasal, for ENT referral per pt reqeust

## 2022-06-17 NOTE — Assessment & Plan Note (Signed)
Last vitamin D Lab Results  Component Value Date   VD25OH 40.59 09/25/2021   Stable, cont oral replacement

## 2022-06-17 NOTE — Progress Notes (Signed)
Patient ID: [name] [name], male   DOB: 25-Dec-1960, 62 y.o.   MRN: [id number]        Chief Complaint: follow up HTN, HLD and hyperglycemia, sinus pain       HPI:  [name] [name] is a 62 y.o. male here with c/o elevated BP with HA and nausea,  4 days last wk up to 181/104, went to UC as still high 148/90 and tx with clonidine 0.1 mg x 1, and improved since then overall.  HR was in the low 40s per pt at [facility]. Has been 82 at cardiology and felt acceptable. Also c/o 1 yr onset sinus pain and swelling wondering if he some kind of growth, asks for ENT referral, will continue flonase.  Pt denies chest pain, increased sob or doe, wheezing, orthopnea, PND, increased LE swelling, palpitations, or syncope.   Pt denies polydipsia, polyuria, or new focal neuro s/s.  Retiring in may 2024, [employer];  Wt Readings from Last 3 Encounters:  06/17/22 199 lb (90.3 kg)  05/08/22 196 lb 12.8 oz (89.3 kg)  09/25/21 200 lb (90.7 kg)   BP Readings from Last 3 Encounters:  06/17/22 118/72  06/12/22 138/88  05/08/22 122/78         Past Medical History:  Diagnosis Date   A-fib [facility])    on Eliquis   CHEST PAIN-PRECORDIAL 05/25/2009   ERECTILE DYSFUNCTION, ORGANIC 01/16/2010   GERD (gastroesophageal reflux disease)    hx of   HYPERLIPIDEMIA 12/15/2007   on meds   HYPERTENSION 12/15/2007   on meds   HYPOKALEMIA 12/15/2007   Internal hemorrhoids    Persistent atrial fibrillation ([place])    a. prior ablation/flecainide; placed on Tikosyn 07/2017.   POSTURAL LIGHTHEADEDNESS 07/06/2008   Seasonal allergies    Sinus bradycardia    Sleep apnea    does not use CPAP   Visit for monitoring Tikosyn therapy 07/03/2017   Past Surgical History:  Procedure Laterality Date   ATRIAL FIBRILLATION ABLATION N/A 08/13/2011   Procedure: ATRIAL FIBRILLATION ABLATION;  Surgeon: Thompson Grayer, MD;  Location: [facility] CATH LAB;  Service: Cardiovascular;  Laterality: N/A;   ATRIAL FIBRILLATION  ABLATION  10/31/2016   ATRIAL FIBRILLATION ABLATION N/A 10/31/2016   Procedure: Atrial Fibrillation Ablation;  Surgeon: Thompson Grayer, MD;  Location: [place] CV LAB;  Service: Cardiovascular;  Laterality: N/A;   ATRIAL FLUTTER ABLATION N/A 12/27/2011   CTI repeat ablation by Dr Rayann Heman   CARDIOVERSION  11/21/2011   Procedure: CARDIOVERSION;  Surgeon: Larey Dresser, MD;  Location: [facility];  Service: Cardiovascular;  Laterality: N/A;   CARDIOVERSION N/A 12/02/2016   Procedure: CARDIOVERSION;  Surgeon: [name], MD;  Location: [facility] ENDOSCOPY;  Service: Cardiovascular;  Laterality: N/A;   CHALAZION EXCISION Right    COLONOSCOPY  2017   SA-MAC-suprep (good)-TA x 2   ELBOW SURGERY Right early 1980s   bone spur/chip; "opened me up"   INGUINAL HERNIA REPAIR Right 11/2006   POLYPECTOMY  2017   TA x 2   TEE WITHOUT CARDIOVERSION  08/12/2011   Procedure: TRANSESOPHAGEAL ECHOCARDIOGRAM (TEE);  Surgeon: Fay Records, MD;  Location: [facility] ENDOSCOPY;  Service: Cardiovascular;  Laterality: N/A;   TEE WITHOUT CARDIOVERSION N/A 10/30/2016   Procedure: TRANSESOPHAGEAL ECHOCARDIOGRAM (TEE);  Surgeon: Josue Hector, MD;  Location: [facility] ENDOSCOPY;  Service: Cardiovascular;  Laterality: N/A;   TEE WITHOUT CARDIOVERSION N/A 12/02/2016   Procedure: TRANSESOPHAGEAL ECHOCARDIOGRAM (TEE);  Surgeon: [name], MD;  Location:  [facility] ENDOSCOPY;  Service: Cardiovascular;  Laterality: N/A;    reports that he quit smoking about 11 years ago. His smoking use included cigarettes. He has a 1.50 pack-year smoking history. He has never used smokeless tobacco. He reports current alcohol use of about 1.0 - 2.0 standard drink of alcohol per week. He reports that he does not use drugs. family history includes Alcohol abuse in his father; Heart disease in his sister; Other in his father; Sleep apnea in his sister; Stroke in his mother; Thyroid disease in his sister. Allergies  Allergen Reactions   Ibuprofen Palpitations  and Other (See Comments)    Rapid heart beat   Current Outpatient Medications on File Prior to Visit  Medication Sig Dispense Refill   albuterol (VENTOLIN HFA) 108 (90 Base) MCG/ACT inhaler Inhale 2 puffs into the lungs every 6 (six) hours as needed for wheezing or shortness of breath. 8 g 0   Arginine 500 MG CAPS Take 500 mg by mouth daily.     Ascorbic Acid (VITAMIN C) 1000 MG tablet Take 1,000 mg by mouth daily.      atorvastatin (LIPITOR) 20 MG tablet Take 1 tablet by mouth once daily 90 tablet 2   diltiazem (CARDIZEM CD) 120 MG 24 hr capsule Take 1 capsule by mouth once daily 90 capsule 2   diltiazem (CARDIZEM) 30 MG tablet Take 1 tablet every 4 hours AS NEEDED for heart rate over 100 45 tablet 2   dofetilide (TIKOSYN) 250 MCG capsule Take 1 capsule (250 mcg total) by mouth 2 (two) times daily. 180 capsule 3   doxazosin (CARDURA) 4 MG tablet TAKE 1 TABLET BY MOUTH AT BEDTIME 90 tablet 2   ELIQUIS 5 MG TABS tablet TAKE 1 TABLET BY MOUTH TWICE DAILY. APPOINTMENT REQUIRED FOR FURTHER REFILLS [phone]. 60 tablet 5   fluticasone (FLONASE) 50 MCG/ACT nasal spray Place 2 sprays into both nostrils daily. 16 g 6   irbesartan (AVAPRO) 300 MG tablet Take 1 tablet by mouth once daily 90 tablet 3   loratadine (CLARITIN) 10 MG tablet Take 1 tablet (10 mg total) by mouth daily. 90 tablet 1   Lysine HCl 500 MG TABS Take 500 mg by mouth daily.      Multiple Vitamins-Minerals (MULTIVITAL) tablet Take 1 tablet by mouth daily.     nitroGLYCERIN (NITROSTAT) 0.4 MG SL tablet Place 1 tablet (0.4 mg total) under the tongue every 5 (five) minutes as needed for chest pain. 30 tablet 0   cholecalciferol (VITAMIN D3) 10 MCG (400 UNIT) TABS tablet Take 400 Units by mouth daily.  (Patient not taking: Reported on 05/08/2022)     No current facility-administered medications on file prior to visit.        ROS:  All others reviewed and negative.  Objective        PE:  BP 118/72 (BP Location: Left Arm, Patient  Position: Sitting, Cuff Size: Large)   Pulse 67   Temp 98.2 F (36.8 C) (Oral)   Ht 6' (1.829 m)   Wt 199 lb (90.3 kg)   SpO2 98%   BMI 26.99 kg/m                 Constitutional: Pt appears in NAD               HENT: Head: NCAT.                Right Ear: External ear normal.  Left Ear: External ear normal.                Eyes: . Pupils are equal, round, and reactive to light. Conjunctivae and EOM are normal               Nose: without d/c or deformity               Neck: Neck supple. Gross normal ROM               Cardiovascular: Normal rate and regular rhythm.                 Pulmonary/Chest: Effort normal and breath sounds without rales or wheezing.                Abd:  Soft, NT, ND, + BS, no organomegaly               Neurological: Pt is alert. At baseline orientation, motor grossly intact               Skin: Skin is warm. No rashes, no other new lesions, LE edema - none               Psychiatric: Pt behavior is normal without agitation   Micro: none  Cardiac tracings I have personally interpreted today:  none  Pertinent Radiological findings (summarize): none   Lab Results  Component Value Date   WBC 8.2 09/25/2021   HGB 14.1 09/25/2021   HCT 42.8 09/25/2021   PLT 209.0 09/25/2021   GLUCOSE 104 (H) 05/08/2022   CHOL 146 09/25/2021   TRIG 163.0 (H) 09/25/2021   HDL 56.30 09/25/2021   LDLDIRECT 53.0 09/22/2020   LDLCALC 57 09/25/2021   ALT 24 09/25/2021   AST 21 09/25/2021   NA 139 05/08/2022   K 4.1 05/08/2022   CL 111 05/08/2022   CREATININE 1.16 05/08/2022   BUN 23 05/08/2022   CO2 24 05/08/2022   TSH 1.03 09/25/2021   PSA 1.01 09/25/2021   INR 1.8 (A) 09/11/2018   HGBA1C 6.1 09/25/2021   Assessment/Plan:  [name] [name] is a 62 y.o. Black or African American [2] male with  has a past medical history of A-fib [facility]), CHEST PAIN-PRECORDIAL (05/25/2009), ERECTILE DYSFUNCTION, ORGANIC (01/16/2010), GERD (gastroesophageal reflux disease),  HYPERLIPIDEMIA (12/15/2007), HYPERTENSION (12/15/2007), HYPOKALEMIA (12/15/2007), Internal hemorrhoids, Persistent atrial fibrillation ([place]), POSTURAL LIGHTHEADEDNESS (07/06/2008), Seasonal allergies, Sinus bradycardia, Sleep apnea, and Visit for monitoring Tikosyn therapy (07/03/2017).  Essential hypertension Recently labile over the past wk with bradycardia by pt report, ? Stress related, pt says less stress and VS much improve last 2 days.  I advised continue current tx wtihout addition or reduction. F/u cardiology as planned  HLD (hyperlipidemia) Lab Results  Component Value Date   LDLCALC 57 09/25/2021   Stable, pt to continue current statin lipitor 20 mg qd   Hyperglycemia Lab Results  Component Value Date   HGBA1C 6.1 09/25/2021   Stable, pt to continue current medical treatment  - diet, wt control   Vitamin D deficiency Last vitamin D Lab Results  Component Value Date   VD25OH 40.59 09/25/2021   Stable, cont oral replacement   Sinus pain For 1 yr, now worsening without fever, but with swelling nasal, for ENT referral per pt reqeust  Followup: Return in about 3 months (around 10/01/2022).  Cathlean Cower, MD 06/17/2022 8:29 PM [facility]

## 2022-06-17 NOTE — Assessment & Plan Note (Signed)
Recently labile over the past wk with bradycardia by pt report, ? Stress related, pt says less stress and VS much improve last 2 days.  I advised continue current tx wtihout addition or reduction. F/u cardiology as planned

## 2022-06-18 LAB — LDL CHOLESTEROL, DIRECT: Direct LDL: 42 mg/dL

## 2022-06-18 LAB — URINALYSIS, ROUTINE W REFLEX MICROSCOPIC
Bilirubin Urine: NEGATIVE
Hgb urine dipstick: NEGATIVE
Ketones, ur: NEGATIVE
Leukocytes,Ua: NEGATIVE
Nitrite: NEGATIVE
RBC / HPF: NONE SEEN (ref 0–?)
Specific Gravity, Urine: >=1.03 — AB (ref 1.000–1.030)
Total Protein, Urine: NEGATIVE
Urine Glucose: NEGATIVE
Urobilinogen, UA: 0.2 (ref 0.0–1.0)
WBC, UA: NONE SEEN (ref 0–?)
pH: 5.5 (ref 5.0–8.0)

## 2022-06-25 ENCOUNTER — Other Ambulatory Visit (HOSPITAL_COMMUNITY): Payer: Self-pay | Admitting: Nurse Practitioner

## 2022-06-27 DIAGNOSIS — M546 Pain in thoracic spine: Secondary | ICD-10-CM | POA: Diagnosis not present

## 2022-07-11 ENCOUNTER — Encounter (HOSPITAL_COMMUNITY): Payer: Self-pay | Admitting: *Deleted

## 2022-09-22 ENCOUNTER — Other Ambulatory Visit: Payer: Self-pay | Admitting: Internal Medicine

## 2022-10-01 ENCOUNTER — Ambulatory Visit (INDEPENDENT_AMBULATORY_CARE_PROVIDER_SITE_OTHER): Payer: 59 | Admitting: Internal Medicine

## 2022-10-01 ENCOUNTER — Encounter: Payer: Self-pay | Admitting: Internal Medicine

## 2022-10-01 VITALS — BP 122/80 | HR 79 | Temp 98.1°F | Ht 72.0 in | Wt 204.0 lb

## 2022-10-01 DIAGNOSIS — E7800 Pure hypercholesterolemia, unspecified: Secondary | ICD-10-CM

## 2022-10-01 DIAGNOSIS — Z0001 Encounter for general adult medical examination with abnormal findings: Secondary | ICD-10-CM

## 2022-10-01 DIAGNOSIS — H6122 Impacted cerumen, left ear: Secondary | ICD-10-CM | POA: Diagnosis not present

## 2022-10-01 DIAGNOSIS — Z125 Encounter for screening for malignant neoplasm of prostate: Secondary | ICD-10-CM | POA: Diagnosis not present

## 2022-10-01 DIAGNOSIS — R739 Hyperglycemia, unspecified: Secondary | ICD-10-CM | POA: Diagnosis not present

## 2022-10-01 DIAGNOSIS — E538 Deficiency of other specified B group vitamins: Secondary | ICD-10-CM

## 2022-10-01 DIAGNOSIS — E559 Vitamin D deficiency, unspecified: Secondary | ICD-10-CM

## 2022-10-01 DIAGNOSIS — I10 Essential (primary) hypertension: Secondary | ICD-10-CM

## 2022-10-01 MED ORDER — IRBESARTAN 300 MG PO TABS: 300.0000 mg | ORAL_TABLET | Freq: Every day | ORAL | 3 refills | Status: DC

## 2022-10-01 MED ORDER — ALBUTEROL SULFATE HFA 108 (90 BASE) MCG/ACT IN AERS: 2.0000 | INHALATION_SPRAY | Freq: Four times a day (QID) | RESPIRATORY_TRACT | 0 refills | Status: DC | PRN

## 2022-10-01 MED ORDER — DILTIAZEM HCL ER COATED BEADS 120 MG PO CP24: 120.0000 mg | ORAL_CAPSULE | Freq: Every day | ORAL | 3 refills | Status: DC

## 2022-10-01 MED ORDER — APIXABAN 5 MG PO TABS: ORAL_TABLET | ORAL | 3 refills | Status: DC

## 2022-10-01 MED ORDER — ATORVASTATIN CALCIUM 20 MG PO TABS: 20.0000 mg | ORAL_TABLET | Freq: Every day | ORAL | 3 refills | Status: DC

## 2022-10-01 NOTE — Progress Notes (Signed)
Patient ID: [name] [name], male   DOB: 1960-10-06, 62 y.o.   MRN: [id number]         Chief Complaint:: wellness exam and low vit d, left cerumen impaction, htn, hld, hyperglycemia       HPI:  DIMA [name] is a 62 y.o. male here for wellness exam; declines covid booster, o/w up to date                        Also to retire may 31.  Pt denies chest pain, increased sob or doe, wheezing, orthopnea, PND, increased LE swelling, palpitations, dizziness or syncope.   Pt denies polydipsia, polyuria, or new focal neuro s/s.    Pt denies fever, wt loss, night sweats, loss of appetite, or other constitutional symptoms  Has slight muffled hearing on the left side for over 1 wk.  Not taking Vit D.     Wt Readings from Last 3 Encounters:  10/01/22 204 lb (92.5 kg)  06/17/22 199 lb (90.3 kg)  05/08/22 196 lb 12.8 oz (89.3 kg)   BP Readings from Last 3 Encounters:  10/01/22 122/80  06/17/22 118/72  06/12/22 138/88   Immunization History  Administered Date(s) Administered   Influenza Split 02/14/2011, 02/26/2012   Influenza, High Dose Seasonal PF 03/06/2020   Influenza, Seasonal, Injecte, Preservative Fre 04/03/2013   Influenza,inj,Quad PF,6+ Mos 04/20/2018, 03/03/2019   Influenza-Unspecified 04/01/2015, 03/02/2017, 03/03/2019, 03/09/2020   Moderna Sars-Covid-2 Vaccination 08/06/2019, 09/07/2019, 05/23/2020   Pneumococcal Conjugate-13 09/22/2019   Td 01/12/2009   Tdap 09/22/2019   Zoster Recombinat (Shingrix) 03/23/2020, 09/22/2020  There are no preventive care reminders to display for this patient.    Past Medical History:  Diagnosis Date   A-fib [facility])    on Eliquis   CHEST PAIN-PRECORDIAL 05/25/2009   ERECTILE DYSFUNCTION, ORGANIC 01/16/2010   GERD (gastroesophageal reflux disease)    hx of   HYPERLIPIDEMIA 12/15/2007   on meds   HYPERTENSION 12/15/2007   on meds   HYPOKALEMIA 12/15/2007   Internal hemorrhoids    Persistent atrial fibrillation (HCC)    a. prior  ablation/flecainide; placed on Tikosyn 07/2017.   POSTURAL LIGHTHEADEDNESS 07/06/2008   Seasonal allergies    Sinus bradycardia    Sleep apnea    does not use CPAP   Visit for monitoring Tikosyn therapy 07/03/2017   Past Surgical History:  Procedure Laterality Date   ATRIAL FIBRILLATION ABLATION N/A 08/13/2011   Procedure: ATRIAL FIBRILLATION ABLATION;  Surgeon: Hillis Range, MD;  Location: [facility] CATH LAB;  Service: Cardiovascular;  Laterality: N/A;   ATRIAL FIBRILLATION ABLATION  10/31/2016   ATRIAL FIBRILLATION ABLATION N/A 10/31/2016   Procedure: Atrial Fibrillation Ablation;  Surgeon: Hillis Range, MD;  [facility]: [facility] INVASIVE CV LAB;  Service: Cardiovascular;  Laterality: N/A;   ATRIAL FLUTTER ABLATION N/A 12/27/2011   CTI repeat ablation by Dr Johney Frame   CARDIOVERSION  11/21/2011   Procedure: CARDIOVERSION;  Surgeon: Laurey Morale, MD;  Location: [facility] OR;  Service: Cardiovascular;  Laterality: N/A;   CARDIOVERSION N/A 12/02/2016   Procedure: CARDIOVERSION;  Surgeon: Chrystie Nose, MD;  Location: [facility] ENDOSCOPY;  Service: Cardiovascular;  Laterality: N/A;   CHALAZION EXCISION Right    COLONOSCOPY  2017   SA-MAC-suprep (good)-TA x 2   ELBOW SURGERY Right early 1980s   bone spur/chip; "opened me up"   INGUINAL HERNIA REPAIR Right 11/2006   POLYPECTOMY  2017   TA x 2   TEE WITHOUT CARDIOVERSION  08/12/2011  Procedure: TRANSESOPHAGEAL ECHOCARDIOGRAM (TEE);  Surgeon: Pricilla Riffle, MD;  Location: [facility] ENDOSCOPY;  Service: Cardiovascular;  Laterality: N/A;   TEE WITHOUT CARDIOVERSION N/A 10/30/2016   Procedure: TRANSESOPHAGEAL ECHOCARDIOGRAM (TEE);  Surgeon: Wendall Stade, MD;  Location: [facility] ENDOSCOPY;  Service: Cardiovascular;  Laterality: N/A;   TEE WITHOUT CARDIOVERSION N/A 12/02/2016   Procedure: TRANSESOPHAGEAL ECHOCARDIOGRAM (TEE);  Surgeon: Chrystie Nose, MD;  Location: [facility] ENDOSCOPY;  Service: Cardiovascular;  Laterality: N/A;    reports that he quit smoking about 11 years ago.  His smoking use included cigarettes. He has a 1.50 pack-year smoking history. He has never used smokeless tobacco. He reports current alcohol use of about 1.0 - 2.0 standard drink of alcohol per week. He reports that he does not use drugs. family history includes Alcohol abuse in his father; Heart disease in his sister; Other in his father; Sleep apnea in his sister; Stroke in his mother; Thyroid disease in his sister. Allergies  Allergen Reactions   Ibuprofen Palpitations and Other (See Comments)    Rapid heart beat   Current Outpatient Medications on File Prior to Visit  Medication Sig Dispense Refill   Arginine 500 MG CAPS Take 500 mg by mouth daily.     Ascorbic Acid (VITAMIN C) 1000 MG tablet Take 1,000 mg by mouth daily.      cholecalciferol (VITAMIN D3) 10 MCG (400 UNIT) TABS tablet Take 400 Units by mouth daily.     diltiazem (CARDIZEM) 30 MG tablet Take 1 tablet every 4 hours AS NEEDED for heart rate over 100 45 tablet 2   dofetilide (TIKOSYN) 250 MCG capsule TAKE 1 CAPSULE BY MOUTH TWICE DAILY 180 capsule 1   doxazosin (CARDURA) 4 MG tablet TAKE 1 TABLET BY MOUTH AT BEDTIME 90 tablet 2   fluticasone (FLONASE) 50 MCG/ACT nasal spray Place 2 sprays into both nostrils daily. 16 g 6   loratadine (CLARITIN) 10 MG tablet Take 1 tablet (10 mg total) by mouth daily. 90 tablet 1   Lysine HCl 500 MG TABS Take 500 mg by mouth daily.      Multiple Vitamins-Minerals (MULTIVITAL) tablet Take 1 tablet by mouth daily.     nitroGLYCERIN (NITROSTAT) 0.4 MG SL tablet Place 1 tablet (0.4 mg total) under the tongue every 5 (five) minutes as needed for chest pain. 30 tablet 0   No current facility-administered medications on file prior to visit.        ROS:  All others reviewed and negative.  Objective        PE:  BP 122/80 (BP Location: Left Arm, Patient Position: Sitting, Cuff Size: Normal)   Pulse 79   Temp 98.1 F (36.7 C) (Oral)   Ht 6' (1.829 m)   Wt 204 lb (92.5 kg)   SpO2 99%   BMI  27.67 kg/m                 Constitutional: Pt appears in NAD               HENT: Head: NCAT.                Right Ear: External ear normal.                 Left Ear: External ear normal. Left cerumen impaction noted, pt states will clear himself with irrigation at home               Eyes: . Pupils are equal, round, and reactive to light.  Conjunctivae and EOM are normal               Nose: without d/c or deformity               Neck: Neck supple. Gross normal ROM               Cardiovascular: Normal rate and regular rhythm.                 Pulmonary/Chest: Effort normal and breath sounds without rales or wheezing.                Abd:  Soft, NT, ND, + BS, no organomegaly               Neurological: Pt is alert. At baseline orientation, motor grossly intact               Skin: Skin is warm. No rashes, no other new lesions, LE edema - none               Psychiatric: Pt behavior is normal without agitation   Micro: none  Cardiac tracings I have personally interpreted today:  none  Pertinent Radiological findings (summarize): none   Lab Results  Component Value Date   WBC 10.6 (H) 06/17/2022   HGB 15.9 06/17/2022   HCT 47.5 06/17/2022   PLT 249.0 06/17/2022   GLUCOSE 99 06/17/2022   CHOL 143 06/17/2022   TRIG 204.0 (H) 06/17/2022   HDL 64.10 06/17/2022   LDLDIRECT 42.0 06/17/2022   LDLCALC 57 09/25/2021   ALT 29 06/17/2022   AST 21 06/17/2022   NA 138 06/17/2022   K 4.2 06/17/2022   CL 106 06/17/2022   CREATININE 1.16 06/17/2022   BUN 23 06/17/2022   CO2 24 06/17/2022   TSH 0.64 06/17/2022   PSA 1.00 06/17/2022   INR 1.8 (A) 09/11/2018   HGBA1C 6.0 06/17/2022   Assessment/Plan:  SONY HINCH is a 62 y.o. Black or African American [2] male with  has a past medical history of A-fib [facility]), CHEST PAIN-PRECORDIAL (05/25/2009), ERECTILE DYSFUNCTION, ORGANIC (01/16/2010), GERD (gastroesophageal reflux disease), HYPERLIPIDEMIA (12/15/2007), HYPERTENSION (12/15/2007),  HYPOKALEMIA (12/15/2007), Internal hemorrhoids, Persistent atrial fibrillation (HCC), POSTURAL LIGHTHEADEDNESS (07/06/2008), Seasonal allergies, Sinus bradycardia, Sleep apnea, and Visit for monitoring Tikosyn therapy (07/03/2017).  Vitamin D deficiency Last vitamin D Lab Results  Component Value Date   VD25OH 27.81 (L) 06/17/2022   Low, to start oral replacement   Encounter for well adult exam with abnormal findings Age and sex appropriate education and counseling updated with regular exercise and diet Referrals for preventative services - none needed Immunizations addressed - declines covid booster Smoking counseling  - none needed Evidence for depression or other mood disorder - none significant Most recent labs reviewed. I have personally reviewed and have noted: 1) the patient's medical and social history 2) The patient's current medications and supplements 3) The patient's height, weight, and BMI have been recorded in the chart   Essential hypertension BP Readings from Last 3 Encounters:  10/01/22 122/80  06/17/22 118/72  06/12/22 138/88   Stable, pt to continue medical treatment cardizen cd 120 qd, avapro 300 qd   HLD (hyperlipidemia) Lab Results  Component Value Date   LDLCALC 57 09/25/2021   Stable, pt to continue current statin lipitor 20 mg qd   Hyperglycemia Lab Results  Component Value Date   HGBA1C 6.0 06/17/2022   Stable, pt to continue current medical treatment  - diet, wt control  Cerumen impaction Mild to mod, pt states will clear this himself with home irrigation,  to f/u any worsening symptoms or concerns  Followup: Return in about 1 year (around 10/01/2023).  Oliver Barre, MD 10/04/2022 3:41 PM [facility] Internal Medicine

## 2022-10-01 NOTE — Assessment & Plan Note (Signed)
Last vitamin D Lab Results  Component Value Date   VD25OH 27.81 (L) 06/17/2022   Low, to start oral replacement

## 2022-10-01 NOTE — Patient Instructions (Addendum)
Please take OTC Vitamin D3 at 2000 units per day, indefinitely, or 4000 units if you already take the 2000 units.    Ok to use the debrox and irrigation for the left ear wax at home  Please continue all other medications as before, and refills have been done if requested.  Please have the pharmacy call with any other refills you may need.  Please continue your efforts at being more active, low cholesterol diet, and weight control.  You are otherwise up to date with prevention measures today.  Please keep your appointments with your specialists as you may have planned  Please make an Appointment to return for your 1 year visit, or sooner if needed, with Lab testing by Appointment as well, to be done about 3-5 days before at the FIRST FLOOR Lab (so this is for TWO appointments - please see the scheduling desk as you leave)

## 2022-10-04 ENCOUNTER — Encounter: Payer: Self-pay | Admitting: Internal Medicine

## 2022-10-04 DIAGNOSIS — H6120 Impacted cerumen, unspecified ear: Secondary | ICD-10-CM | POA: Insufficient documentation

## 2022-10-04 NOTE — Assessment & Plan Note (Signed)

## 2022-10-04 NOTE — Assessment & Plan Note (Signed)
Lab Results  Component Value Date   HGBA1C 6.0 06/17/2022   Stable, pt to continue current medical treatment  - diet, wt control

## 2022-10-04 NOTE — Assessment & Plan Note (Signed)
Lab Results  Component Value Date   LDLCALC 57 09/25/2021   Stable, pt to continue current statin lipitor 20 mg qd  

## 2022-10-04 NOTE — Assessment & Plan Note (Signed)
Mild to mod, pt states will clear this himself with home irrigation,  to f/u any worsening symptoms or concerns

## 2022-10-04 NOTE — Assessment & Plan Note (Signed)
BP Readings from Last 3 Encounters:  10/01/22 122/80  06/17/22 118/72  06/12/22 138/88   Stable, pt to continue medical treatment cardizen cd 120 qd, avapro 300 qd

## 2022-10-11 ENCOUNTER — Other Ambulatory Visit: Payer: Self-pay

## 2022-10-11 NOTE — Telephone Encounter (Signed)
Patient is asking for the pending medication to be filled.

## 2022-10-14 ENCOUNTER — Other Ambulatory Visit: Payer: Self-pay

## 2022-10-14 ENCOUNTER — Other Ambulatory Visit: Payer: Self-pay | Admitting: Internal Medicine

## 2022-10-31 ENCOUNTER — Ambulatory Visit (HOSPITAL_COMMUNITY)
Admission: RE | Admit: 2022-10-31 | Discharge: 2022-10-31 | Disposition: A | Discharge status: Home / Self Care | Payer: 59 | Source: Ambulatory Visit | Attending: Physician Assistant | Admitting: Physician Assistant

## 2022-10-31 VITALS — BP 144/90 | HR 50 | Ht 72.0 in | Wt 202.6 lb

## 2022-10-31 DIAGNOSIS — Z5181 Encounter for therapeutic drug level monitoring: Secondary | ICD-10-CM

## 2022-10-31 DIAGNOSIS — Z8616 Personal history of COVID-19: Secondary | ICD-10-CM | POA: Diagnosis not present

## 2022-10-31 DIAGNOSIS — Z79899 Other long term (current) drug therapy: Secondary | ICD-10-CM | POA: Diagnosis not present

## 2022-10-31 DIAGNOSIS — Z87891 Personal history of nicotine dependence: Secondary | ICD-10-CM | POA: Insufficient documentation

## 2022-10-31 DIAGNOSIS — I10 Essential (primary) hypertension: Secondary | ICD-10-CM | POA: Diagnosis not present

## 2022-10-31 DIAGNOSIS — I4819 Other persistent atrial fibrillation: Secondary | ICD-10-CM | POA: Insufficient documentation

## 2022-10-31 DIAGNOSIS — Z7901 Long term (current) use of anticoagulants: Secondary | ICD-10-CM | POA: Insufficient documentation

## 2022-10-31 DIAGNOSIS — R001 Bradycardia, unspecified: Secondary | ICD-10-CM | POA: Diagnosis not present

## 2022-10-31 LAB — BASIC METABOLIC PANEL
Anion gap: 11 (ref 5–15)
BUN: 18 mg/dL (ref 8–23)
CO2: 21 mmol/L — ABNORMAL LOW (ref 22–32)
Calcium: 9.3 mg/dL (ref 8.9–10.3)
Chloride: 104 mmol/L (ref 98–111)
Creatinine, Ser: 1.08 mg/dL (ref 0.61–1.24)
GFR, Estimated: >60 mL/min (ref >60)
Glucose, Bld: 156 mg/dL — ABNORMAL HIGH (ref 70–99)
Potassium: 4 mmol/L (ref 3.5–5.1)
Sodium: 136 mmol/L (ref 135–145)

## 2022-10-31 LAB — MAGNESIUM: Magnesium: 2.2 mg/dL (ref 1.7–2.4)

## 2022-10-31 NOTE — Progress Notes (Signed)
Primary Care Physician: Corwin Levins, MD Referring Physician: Jenna Luo is a 62 y.o. male with a h/o afib in the afib clinic for Tikosyn surveillance. He reports that he has not noted any sustained afib. He will have an infrequent breakthrough episode, rarely lasts more than one day.  He is being compliant with  Tikosyn. and  eliquis with a CHA2DS2VASc score of 1. No bleeding issues.  He presented in afib this am, but by auscultation was back in SR by end of the appointment. He has had his covid shots this  last spring.   F/u in  afib clinic, 07/12/20. He has been maintaining SR but feels draggy in the am. His BP is soft in the am and he may not need the low dose amlodipine. He continues on tikosyn and eliquis.   F/u in afib clinic, 01/24/21 for Tikosyn surveillance.  He presented for a colonoscopy last week and was in afib with RVR so the procedure was cancelled and referred back here. He reports that he returned to SR later that day. He has h/o of intermittent breakthrough episodes that last less than 24 hours. He is being compliant with Tikosyn and eliqiuis 5 mg bid. He states that he had to drink the solution for clean out the evening before and again the next morning. He took his meds around 9 am but had the feeling he was in afib before he left the house for the procedure that afternoon. Probably, he had poor absorption of meds with the continuation of washout  prep the next morning.   Seeing back in afib clinic 07/25/21 for Tikosyn surveillance. He had an episode 07/12/21 that lasted 2 days triggered by work stress. He is in sinus brady today. [facility] notified pt that they will no longer be carrying dofetilide. We will assist pt to find another avenue to obtain drug.  Qt stable.   F/u in afib clinic, 05/08/22. He is doing well staying in SR. He did have COVID one month ago and feels he is recovered. Compliant with tikosyn.   Follow up in the AF clinic 10/31/22. Patient reports  that he has done well since his last visit. He has not had any symptoms of afib in the interim. No bleeding issues on anticoagulation. He is retiring from his job tomorrow.   Today, he denies symptoms of palpitations, chest pain, shortness of breath, orthopnea, PND, lower extremity edema, dizziness, presyncope, syncope, or neurologic sequela. The patient is tolerating medications without difficulties and is otherwise without complaint today.   Past Medical History:  Diagnosis Date   A-fib [facility])    on Eliquis   CHEST PAIN-PRECORDIAL 05/25/2009   ERECTILE DYSFUNCTION, ORGANIC 01/16/2010   GERD (gastroesophageal reflux disease)    hx of   HYPERLIPIDEMIA 12/15/2007   on meds   HYPERTENSION 12/15/2007   on meds   HYPOKALEMIA 12/15/2007   Internal hemorrhoids    Persistent atrial fibrillation (HCC)    a. prior ablation/flecainide; placed on Tikosyn 07/2017.   POSTURAL LIGHTHEADEDNESS 07/06/2008   Seasonal allergies    Sinus bradycardia    Sleep apnea    does not use CPAP   Visit for monitoring Tikosyn therapy 07/03/2017   Past Surgical History:  Procedure Laterality Date   ATRIAL FIBRILLATION ABLATION N/A 08/13/2011   Procedure: ATRIAL FIBRILLATION ABLATION;  Surgeon: Hillis Range, MD;  Location: [facility] CATH LAB;  Service: Cardiovascular;  Laterality: N/A;   ATRIAL FIBRILLATION ABLATION  10/31/2016  ATRIAL FIBRILLATION ABLATION N/A 10/31/2016   Procedure: Atrial Fibrillation Ablation;  Surgeon: Hillis Range, MD;  Location: [facility] INVASIVE CV LAB;  Service: Cardiovascular;  Laterality: N/A;   ATRIAL FLUTTER ABLATION N/A 12/27/2011   CTI repeat ablation by Dr Johney Frame   CARDIOVERSION  11/21/2011   Procedure: CARDIOVERSION;  Surgeon: Laurey Morale, MD;  Location: [facility] OR;  Service: Cardiovascular;  Laterality: N/A;   CARDIOVERSION N/A 12/02/2016   Procedure: CARDIOVERSION;  Surgeon: Chrystie Nose, MD;  Location: [facility] ENDOSCOPY;  Service: Cardiovascular;  Laterality: N/A;   CHALAZION EXCISION  Right    COLONOSCOPY  2017   SA-MAC-suprep (good)-TA x 2   ELBOW SURGERY Right early 1980s   bone spur/chip; "opened me up"   INGUINAL HERNIA REPAIR Right 11/2006   POLYPECTOMY  2017   TA x 2   TEE WITHOUT CARDIOVERSION  08/12/2011   Procedure: TRANSESOPHAGEAL ECHOCARDIOGRAM (TEE);  Surgeon: Pricilla Riffle, MD;  Location: [facility] ENDOSCOPY;  Service: Cardiovascular;  Laterality: N/A;   TEE WITHOUT CARDIOVERSION N/A 10/30/2016   Procedure: TRANSESOPHAGEAL ECHOCARDIOGRAM (TEE);  Surgeon: Wendall Stade, MD;  Location: [facility] ENDOSCOPY;  Service: Cardiovascular;  Laterality: N/A;   TEE WITHOUT CARDIOVERSION N/A 12/02/2016   Procedure: TRANSESOPHAGEAL ECHOCARDIOGRAM (TEE);  Surgeon: Chrystie Nose, MD;  Location: [facility] ENDOSCOPY;  Service: Cardiovascular;  Laterality: N/A;    Current Outpatient Medications  Medication Sig Dispense Refill   albuterol (VENTOLIN HFA) 108 (90 Base) MCG/ACT inhaler Inhale 2 puffs into the lungs every 6 (six) hours as needed for wheezing or shortness of breath. 8 g 0   apixaban (ELIQUIS) 5 MG TABS tablet TAKE 1 TABLET BY MOUTH TWICE DAILY. APPOINTMENT REQUIRED FOR FURTHER REFILLS [phone]. 180 tablet 3   Arginine 500 MG CAPS Take 500 mg by mouth daily.     Ascorbic Acid (VITAMIN C) 1000 MG tablet Take 1,000 mg by mouth daily.      atorvastatin (LIPITOR) 20 MG tablet Take 1 tablet (20 mg total) by mouth daily. 90 tablet 3   CHOLECALCIFEROL PO Take 2,000 mcg by mouth in the morning and at bedtime.     diltiazem (CARDIZEM CD) 120 MG 24 hr capsule Take 1 capsule (120 mg total) by mouth daily. 90 capsule 3   diltiazem (CARDIZEM) 30 MG tablet Take 1 tablet every 4 hours AS NEEDED for heart rate over 100 45 tablet 2   dofetilide (TIKOSYN) 250 MCG capsule TAKE 1 CAPSULE BY MOUTH TWICE DAILY 180 capsule 1   doxazosin (CARDURA) 4 MG tablet TAKE 1 TABLET BY MOUTH AT BEDTIME 90 tablet 0   fluticasone (FLONASE) 50 MCG/ACT nasal spray Place 2 sprays into both nostrils daily. 16 g 6    irbesartan (AVAPRO) 300 MG tablet Take 1 tablet (300 mg total) by mouth daily. 90 tablet 3   loratadine (CLARITIN) 10 MG tablet Take 1 tablet by mouth once daily 90 tablet 0   Lysine HCl 500 MG TABS Take 500 mg by mouth daily.      Multiple Vitamins-Minerals (MULTIVITAL) tablet Take 1 tablet by mouth daily.     nitroGLYCERIN (NITROSTAT) 0.4 MG SL tablet Place 1 tablet (0.4 mg total) under the tongue every 5 (five) minutes as needed for chest pain. 30 tablet 0   No current facility-administered medications for this encounter.    Allergies  Allergen Reactions   Ibuprofen Palpitations and Other (See Comments)    Rapid heart beat    Social History   Socioeconomic History   Marital status: Married  Spouse name: Not on file   Number of children: 2   Years of education: Not on file   Highest education level: Not on file  Occupational History   Occupation: SECURITY    Employer: [employer]  Tobacco Use   Smoking status: Former    Packs/day: 0.10    Years: 15.00    Additional pack years: 0.00    Total pack years: 1.50    Types: Cigarettes    Quit date: 12/17/2010    Years since quitting: 11.8   Smokeless tobacco: Never   Tobacco comments:    Smoked once in a while. not a daily smoker  Vaping Use   Vaping Use: Never used  Substance and Sexual Activity   Alcohol use: Yes    Alcohol/week: 1.0 - 2.0 standard drink of alcohol    Types: 1 - 2 Standard drinks or equivalent per week   Drug use: No   Sexual activity: Not Currently  Other Topics Concern   Not on file  Social History Narrative   Pt lives in [place] with spouse. 2 grown children.   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   Social Determinants of Health   Financial Resource Strain: Not on file  Food Insecurity: Not on file  Transportation Needs: Not on file  Physical Activity: Not on file  Stress: Not on file  Social Connections: Not on file  Intimate Partner Violence: Not on file    Family  History  Problem Relation Age of Onset   Stroke Mother    Other Father        deceased stomach hemorrhage- alcohol use/abuse   Alcohol abuse Father    Heart disease Sister    Thyroid disease Sister    Sleep apnea Sister    Anesthesia problems Neg Hx    Esophageal cancer Neg Hx    Colon cancer Neg Hx    Rectal cancer Neg Hx    Stomach cancer Neg Hx    Colon polyps Neg Hx     ROS- All systems are reviewed and negative except as per the HPI above  Physical Exam: Vitals:   10/31/22 0911  BP: (!) 144/90  Pulse: (!) 50  Weight: 91.9 kg  Height: 6' (1.829 m)    Wt Readings from Last 3 Encounters:  10/31/22 91.9 kg  10/01/22 92.5 kg  06/17/22 90.3 kg    Labs: Lab Results  Component Value Date   NA 138 06/17/2022   K 4.2 06/17/2022   CL 106 06/17/2022   CO2 24 06/17/2022   GLUCOSE 99 06/17/2022   BUN 23 06/17/2022   CREATININE 1.16 06/17/2022   CALCIUM 9.8 06/17/2022   MG 2.1 05/08/2022   Lab Results  Component Value Date   INR 1.8 (A) 09/11/2018   Lab Results  Component Value Date   CHOL 143 06/17/2022   HDL 64.10 06/17/2022   LDLCALC 57 09/25/2021   TRIG 204.0 (H) 06/17/2022     GEN- The patient is a well appearing male, alert and oriented x 3 today.   HEENT-head normocephalic, atraumatic, sclera clear, conjunctiva pink, hearing intact, trachea midline. Lungs- Clear to ausculation bilaterally, normal work of breathing Heart- Regular rate and rhythm, no murmurs, rubs or gallops  GI- soft, NT, ND, + BS Extremities- no clubbing, cyanosis, or edema MS- no significant deformity or atrophy Skin- no rash or lesion Psych- euthymic mood, full affect Neuro- strength and sensation are intact   ECG today demonstrates SB Vent. rate 50  BPM PR interval 168 ms QRS duration 82 ms QT/QTcB 520/474 ms   CHA2DS2-VASc Score = 1  The patient's score is based upon: CHF History: 0 HTN History: 1 Diabetes History: 0 Stroke History: 0 Vascular Disease History:  0 Age Score: 0 Gender Score: 0       ASSESSMENT AND PLAN: 1. Persistent Atrial Fibrillation (ICD10:  I48.19) The patient's CHA2DS2-VASc score is 1, indicating a 0.6% annual risk of stroke.   Previously failed flecainide. S/p afib ablation 2018 with Dr Johney Frame. Patient appears to be maintaining SR. Continue dofetilide 250 mcg BID. QT stable. Check bmet/mag today.  Continue diltiazem 120 mg daily Continue Eliquis 5 mg BID  2. HTN Stable, no changes today.   Follow up in the AF clinic in 6 months.    Jorja Loa PA-C [facility] [address] [place], [place] [address] [phone]

## 2022-12-02 ENCOUNTER — Other Ambulatory Visit: Payer: Self-pay

## 2022-12-02 ENCOUNTER — Emergency Department (HOSPITAL_BASED_OUTPATIENT_CLINIC_OR_DEPARTMENT_OTHER)
Admission: EM | Admit: 2022-12-02 | Discharge: 2022-12-02 | Disposition: A | Discharge status: Home / Self Care | Payer: Federal, State, Local not specified - PPO | Attending: Emergency Medicine | Admitting: Emergency Medicine

## 2022-12-02 ENCOUNTER — Encounter (HOSPITAL_BASED_OUTPATIENT_CLINIC_OR_DEPARTMENT_OTHER): Payer: Self-pay

## 2022-12-02 DIAGNOSIS — S8011XA Contusion of right lower leg, initial encounter: Secondary | ICD-10-CM | POA: Diagnosis not present

## 2022-12-02 DIAGNOSIS — W228XXA Striking against or struck by other objects, initial encounter: Secondary | ICD-10-CM | POA: Diagnosis not present

## 2022-12-02 DIAGNOSIS — T148XXA Other injury of unspecified body region, initial encounter: Secondary | ICD-10-CM

## 2022-12-02 DIAGNOSIS — S8991XA Unspecified injury of right lower leg, initial encounter: Secondary | ICD-10-CM | POA: Diagnosis not present

## 2022-12-02 DIAGNOSIS — Z7901 Long term (current) use of anticoagulants: Secondary | ICD-10-CM | POA: Insufficient documentation

## 2022-12-02 DIAGNOSIS — S81811A Laceration without foreign body, right lower leg, initial encounter: Secondary | ICD-10-CM | POA: Diagnosis not present

## 2022-12-02 NOTE — Discharge Instructions (Addendum)
Keep the pressure dressing on it for the next 24 hours.  Make sure you are changing the dressing daily, and applying bacitracin on the area to prevent infection.  If there is redness, swelling, worsening pain please return to the ER.  Additionally that area of the leg does not have a lot of vasculature but hematomas are common with any kind of trauma.  If the hematoma starts getting a lot larger, you have loss of sensation in your leg, or coolness please return to the ER.

## 2022-12-02 NOTE — ED Triage Notes (Signed)
Patient here POV from Home.  Endorses tearing his Right Anterior lower Leg Skin on a Plastic Bin an hour or so ago. Tetanus UTD.  NAD Noted during Triage. A&Ox4. Gcs 15. Ambulatory.

## 2022-12-02 NOTE — ED Notes (Signed)
Pt verbalized understanding of d/c instructions, meds, and followup care. Denies questions. VSS, no distress noted. Steady gait to exit with all belongings. Sent home with supplies for dressing wound.

## 2022-12-02 NOTE — ED Provider Notes (Signed)
EMERGENCY DEPARTMENT AT [facility] Provider Note   CSN: [id number] Arrival date & time: 12/02/22  1813     History  Chief Complaint  Patient presents with   Skin Tear    AZARI KADEN is a 62 y.o. male, history of A-fib on Eliquis, who presents to the ED secondary to trauma to the right lower extremity.  He states that he was getting out of truck, and hit his leg on a hard plastic container.  States he developed a skin tear and was concerned as he is on Eliquis.  He has applied pressure, and it occurred about an hour ago.  States he recently had his tetanus updated.  Denies any pain or numbness or tingling.   Home Medications Prior to Admission medications   Medication Sig Start Date End Date Taking? Authorizing Provider  albuterol (VENTOLIN HFA) 108 (90 Base) MCG/ACT inhaler Inhale 2 puffs into the lungs every 6 (six) hours as needed for wheezing or shortness of breath. 10/01/22   Corwin Levins, MD  apixaban (ELIQUIS) 5 MG TABS tablet TAKE 1 TABLET BY MOUTH TWICE DAILY. APPOINTMENT REQUIRED FOR FURTHER REFILLS [phone]. 10/01/22   Corwin Levins, MD  Arginine 500 MG CAPS Take 500 mg by mouth daily.    [provider]  Ascorbic Acid (VITAMIN C) 1000 MG tablet Take 1,000 mg by mouth daily.     [provider]  atorvastatin (LIPITOR) 20 MG tablet Take 1 tablet (20 mg total) by mouth daily. 10/01/22   Corwin Levins, MD  CHOLECALCIFEROL PO Take 2,000 mcg by mouth in the morning and at bedtime.    [provider]  diltiazem (CARDIZEM CD) 120 MG 24 hr capsule Take 1 capsule (120 mg total) by mouth daily. 10/01/22   Corwin Levins, MD  diltiazem (CARDIZEM) 30 MG tablet Take 1 tablet every 4 hours AS NEEDED for heart rate over 100 07/12/21   Newman Nip, NP  dofetilide (TIKOSYN) 250 MCG capsule TAKE 1 CAPSULE BY MOUTH TWICE DAILY 06/25/22   Newman Nip, NP  doxazosin (CARDURA) 4 MG tablet TAKE 1 TABLET BY MOUTH AT BEDTIME 10/11/22   Corwin Levins, MD  fluticasone [facility]) 50 MCG/ACT nasal spray Place 2 sprays into both nostrils daily. 03/07/22   Garnette Gunner, MD  irbesartan (AVAPRO) 300 MG tablet Take 1 tablet (300 mg total) by mouth daily. 10/01/22   Corwin Levins, MD  loratadine (CLARITIN) 10 MG tablet Take 1 tablet by mouth once daily 10/14/22   Corwin Levins, MD  Lysine HCl 500 MG TABS Take 500 mg by mouth daily.     [provider]  Multiple Vitamins-Minerals (MULTIVITAL) tablet Take 1 tablet by mouth daily.    [provider]  nitroGLYCERIN (NITROSTAT) 0.4 MG SL tablet Place 1 tablet (0.4 mg total) under the tongue every 5 (five) minutes as needed for chest pain. 12/18/20   Newman Nip, NP      Allergies    Ibuprofen    Review of Systems   Review of Systems  Musculoskeletal:  Negative for joint swelling.  Skin:  Positive for wound.    Physical Exam Updated Vital Signs BP 120/86 (BP Location: Right Arm)   Pulse 77   Temp (!) 96.5 F (35.8 C) (Temporal)   Resp 18   Ht 6' (1.829 m)   Wt 89.8 kg   SpO2 100%   BMI 26.85 kg/m  Physical Exam Vitals and  nursing note reviewed.  Constitutional:      General: He is not in acute distress.    Appearance: He is well-developed.  HENT:     Head: Normocephalic and atraumatic.  Eyes:     Conjunctiva/sclera: Conjunctivae normal.  Cardiovascular:     Rate and Rhythm: Normal rate and regular rhythm.     Heart sounds: No murmur heard. Pulmonary:     Effort: Pulmonary effort is normal. No respiratory distress.     Breath sounds: Normal breath sounds.  Musculoskeletal:        General: No swelling.     Cervical back: Neck supple.  Skin:    General: Skin is warm.     Capillary Refill: Capillary refill takes less than 2 seconds.     Comments: 2cmx1cm skin avulsion to right anterior lower leg w/hematoma  Neurological:     Mental Status: He is alert.  Psychiatric:        Mood and Affect: Mood normal.     ED Results / Procedures / Treatments    Labs (all labs ordered are listed, but only abnormal results are displayed) Labs Reviewed - No data to display  EKG None  Radiology No results found.  Procedures Procedures    Medications Ordered in ED Medications - No data to display  ED Course/ Medical Decision Making/ A&P   {                           Medical Decision Making Patient is a 62 year old male, here for skin tear to the right anterior tibia, has been going on for the last hours.  States he hit his leg with a plastic bin.  Tetanus is up-to-date..  Does not have a flap that can be repaired, and is well-controlled.  We will put pressure dressing on it.  He does have a Buffi Ewton hematoma forming, but this is not very noticeable.  He has a good pulse, his dorsalis pedis.  In good sensation in his leg.  We discussed the return precautions, and that given the vasculature in the leg, he could have arterial bleed would be very unlikely.  We discussed watching this, and further monitoring.  Return precautions emphasized   Final Clinical Impression(s) / ED Diagnoses Final diagnoses:  Skin avulsion    Rx / DC Orders ED Discharge Orders     None         Camran Keady, Harley [place], PA 12/02/22 Margretta Ditty    Alvira Monday, MD 12/03/22 [phone]

## 2023-01-09 ENCOUNTER — Other Ambulatory Visit: Payer: Self-pay | Admitting: Internal Medicine

## 2023-01-17 ENCOUNTER — Other Ambulatory Visit: Payer: Self-pay | Admitting: Internal Medicine

## 2023-01-17 ENCOUNTER — Other Ambulatory Visit: Payer: Self-pay

## 2023-03-18 ENCOUNTER — Other Ambulatory Visit (HOSPITAL_COMMUNITY): Payer: Self-pay | Admitting: *Deleted

## 2023-03-18 MED ORDER — DOFETILIDE 250 MCG PO CAPS: 250.0000 ug | ORAL_CAPSULE | Freq: Two times a day (BID) | ORAL | 2 refills | Status: DC

## 2023-04-07 ENCOUNTER — Other Ambulatory Visit: Payer: Self-pay | Admitting: Internal Medicine

## 2023-04-07 ENCOUNTER — Other Ambulatory Visit: Payer: Self-pay

## 2023-05-08 ENCOUNTER — Encounter (HOSPITAL_COMMUNITY): Payer: Self-pay | Admitting: Physician Assistant

## 2023-05-08 ENCOUNTER — Ambulatory Visit (HOSPITAL_COMMUNITY)
Admission: RE | Admit: 2023-05-08 | Discharge: 2023-05-08 | Disposition: A | Discharge status: Home / Self Care | Payer: Federal, State, Local not specified - PPO | Source: Ambulatory Visit | Attending: Physician Assistant | Admitting: Physician Assistant

## 2023-05-08 ENCOUNTER — Other Ambulatory Visit (HOSPITAL_COMMUNITY): Payer: Self-pay | Admitting: *Deleted

## 2023-05-08 VITALS — BP 114/84 | HR 120 | Ht 72.0 in | Wt 203.0 lb

## 2023-05-08 DIAGNOSIS — I4819 Other persistent atrial fibrillation: Secondary | ICD-10-CM | POA: Insufficient documentation

## 2023-05-08 DIAGNOSIS — Z7901 Long term (current) use of anticoagulants: Secondary | ICD-10-CM | POA: Insufficient documentation

## 2023-05-08 DIAGNOSIS — Z5181 Encounter for therapeutic drug level monitoring: Secondary | ICD-10-CM

## 2023-05-08 DIAGNOSIS — Z79899 Other long term (current) drug therapy: Secondary | ICD-10-CM | POA: Diagnosis not present

## 2023-05-08 DIAGNOSIS — G4733 Obstructive sleep apnea (adult) (pediatric): Secondary | ICD-10-CM | POA: Diagnosis not present

## 2023-05-08 DIAGNOSIS — I10 Essential (primary) hypertension: Secondary | ICD-10-CM | POA: Diagnosis not present

## 2023-05-08 LAB — CBC
HCT: 47.8 % (ref 39.0–52.0)
Hemoglobin: 15.6 g/dL (ref 13.0–17.0)
MCH: 29.1 pg (ref 26.0–34.0)
MCHC: 32.6 g/dL (ref 30.0–36.0)
MCV: 89 fL (ref 80.0–100.0)
Platelets: 246 10*3/uL (ref 150–400)
RBC: 5.37 MIL/uL (ref 4.22–5.81)
RDW: 12.8 % (ref 11.5–15.5)
WBC: 10 10*3/uL (ref 4.0–10.5)
nRBC: 0 % (ref 0.0–0.2)

## 2023-05-08 LAB — BASIC METABOLIC PANEL
Anion gap: 13 (ref 5–15)
BUN: 22 mg/dL (ref 8–23)
CO2: 17 mmol/L — ABNORMAL LOW (ref 22–32)
Calcium: 9.1 mg/dL (ref 8.9–10.3)
Chloride: 105 mmol/L (ref 98–111)
Creatinine, Ser: 1.35 mg/dL — ABNORMAL HIGH (ref 0.61–1.24)
GFR, Estimated: 59 mL/min — ABNORMAL LOW (ref >60)
Glucose, Bld: 123 mg/dL — ABNORMAL HIGH (ref 70–99)
Potassium: 3.6 mmol/L (ref 3.5–5.1)
Sodium: 135 mmol/L (ref 135–145)

## 2023-05-08 LAB — MAGNESIUM: Magnesium: 2 mg/dL (ref 1.7–2.4)

## 2023-05-08 MED ORDER — POTASSIUM CHLORIDE ER 10 MEQ PO TBCR: 10.0000 meq | EXTENDED_RELEASE_TABLET | Freq: Every day | ORAL | 3 refills | Status: DC

## 2023-05-08 NOTE — Progress Notes (Signed)
Primary Care Physician: Corwin Levins, MD Referring Physician: Jenna Luo is a 62 y.o. male with a h/o afib in the afib clinic for Tikosyn surveillance. He reports that he has not noted any sustained afib. He will have an infrequent breakthrough episode, rarely lasts more than one day.  He is being compliant with  Tikosyn. and  eliquis with a CHA2DS2VASc score of 1. No bleeding issues.  He presented in afib this am, but by auscultation was back in SR by end of the appointment. He has had his covid shots this  last spring.   F/u in  afib clinic, 07/12/20. He has been maintaining SR but feels draggy in the am. His BP is soft in the am and he may not need the low dose amlodipine. He continues on tikosyn and eliquis.   F/u in afib clinic, 01/24/21 for Tikosyn surveillance.  He presented for a colonoscopy last week and was in afib with RVR so the procedure was cancelled and referred back here. He reports that he returned to SR later that day. He has h/o of intermittent breakthrough episodes that last less than 24 hours. He is being compliant with Tikosyn and eliqiuis 5 mg bid. He states that he had to drink the solution for clean out the evening before and again the next morning. He took his meds around 9 am but had the feeling he was in afib before he left the house for the procedure that afternoon. Probably, he had poor absorption of meds with the continuation of washout  prep the next morning.   Seeing back in afib clinic 07/25/21 for Tikosyn surveillance. He had an episode 07/12/21 that lasted 2 days triggered by work stress. He is in sinus brady today. [facility] notified pt that they will no longer be carrying dofetilide. We will assist pt to find another avenue to obtain drug.  Qt stable.   F/u in afib clinic, 05/08/22. He is doing well staying in SR. He did have COVID one month ago and feels he is recovered. Compliant with tikosyn.   Follow up in the AF clinic 10/31/22. Patient reports  that he has done well since his last visit. He has not had any symptoms of afib in the interim. No bleeding issues on anticoagulation. He is retiring from his job tomorrow.   Follow up in the AF clinic 05/08/23. Patient reports that he feels well. He is surprised that he is in afib today. There were no specific triggers that he could identify except that he has not been sleeping well. No bleeding issues on anticoagulation.   Today, he denies symptoms of palpitations, chest pain, shortness of breath, orthopnea, PND, lower extremity edema, dizziness, presyncope, syncope, or neurologic sequela. The patient is tolerating medications without difficulties and is otherwise without complaint today.   Past Medical History:  Diagnosis Date   A-fib [facility])    on Eliquis   CHEST PAIN-PRECORDIAL 05/25/2009   ERECTILE DYSFUNCTION, ORGANIC 01/16/2010   GERD (gastroesophageal reflux disease)    hx of   HYPERLIPIDEMIA 12/15/2007   on meds   HYPERTENSION 12/15/2007   on meds   HYPOKALEMIA 12/15/2007   Internal hemorrhoids    Persistent atrial fibrillation (HCC)    a. prior ablation/flecainide; placed on Tikosyn 07/2017.   POSTURAL LIGHTHEADEDNESS 07/06/2008   Seasonal allergies    Sinus bradycardia    Sleep apnea    does not use CPAP   Visit for monitoring Tikosyn therapy 07/03/2017  Current Outpatient Medications  Medication Sig Dispense Refill   albuterol (VENTOLIN HFA) 108 (90 Base) MCG/ACT inhaler Inhale 2 puffs into the lungs every 6 (six) hours as needed for wheezing or shortness of breath. 8 g 0   apixaban (ELIQUIS) 5 MG TABS tablet TAKE 1 TABLET BY MOUTH TWICE DAILY. APPOINTMENT REQUIRED FOR FURTHER REFILLS [phone]. 180 tablet 3   Arginine 500 MG CAPS Take 500 mg by mouth daily.     Ascorbic Acid (VITAMIN C) 1000 MG tablet Take 1,000 mg by mouth daily.      atorvastatin (LIPITOR) 20 MG tablet Take 1 tablet (20 mg total) by mouth daily. 90 tablet 3   CHOLECALCIFEROL PO Take 2,000 mcg by  mouth in the morning and at bedtime.     diltiazem (CARDIZEM CD) 120 MG 24 hr capsule Take 1 capsule (120 mg total) by mouth daily. 90 capsule 3   diltiazem (CARDIZEM) 30 MG tablet Take 1 tablet every 4 hours AS NEEDED for heart rate over 100 45 tablet 2   dofetilide (TIKOSYN) 250 MCG capsule Take 1 capsule (250 mcg total) by mouth 2 (two) times daily. 180 capsule 2   doxazosin (CARDURA) 4 MG tablet TAKE 1 TABLET BY MOUTH AT BEDTIME 90 tablet 3   fluticasone (FLONASE) 50 MCG/ACT nasal spray Place 2 sprays into both nostrils daily. 16 g 6   irbesartan (AVAPRO) 300 MG tablet Take 1 tablet (300 mg total) by mouth daily. 90 tablet 3   loratadine (CLARITIN) 10 MG tablet Take 1 tablet by mouth once daily 90 tablet 3   Lysine HCl 500 MG TABS Take 500 mg by mouth daily.      Multiple Vitamins-Minerals (MULTIVITAL) tablet Take 1 tablet by mouth daily.     nitroGLYCERIN (NITROSTAT) 0.4 MG SL tablet Place 1 tablet (0.4 mg total) under the tongue every 5 (five) minutes as needed for chest pain. 30 tablet 0   No current facility-administered medications for this encounter.    ROS- All systems are reviewed and negative except as per the HPI above  Physical Exam: Vitals:   05/08/23 0912  BP: 114/84  Pulse: (!) 120  Weight: 92.1 kg  Height: 6' (1.829 m)     Wt Readings from Last 3 Encounters:  05/08/23 92.1 kg  12/02/22 89.8 kg  10/31/22 91.9 kg    GEN: Well nourished, well developed in no acute distress NECK: No JVD; No carotid bruits CARDIAC: Irregularly irregular rate and rhythm, no murmurs, rubs, gallops RESPIRATORY:  Clear to auscultation without rales, wheezing or rhonchi  ABDOMEN: Soft, non-tender, non-distended EXTREMITIES:  No edema; No deformity    ECG today demonstrates Afib with RVR Vent. rate 120 BPM PR interval * ms QRS duration 84 ms QT/QTcB 336/474 ms   CHA2DS2-VASc Score = 1  The patient's score is based upon: CHF History: 0 HTN History: 1 Diabetes History:  0 Stroke History: 0 Vascular Disease History: 0 Age Score: 0 Gender Score: 0       ASSESSMENT AND PLAN: Persistent Atrial Fibrillation (ICD10:  I48.19) The patient's CHA2DS2-VASc score is 1, indicating a 0.6% annual risk of stroke.   Previously failed flecainide. S/p afib ablation 2018 with Dr Johney Frame. S/p dofetilide loading 2019 Patient in afib today, unclear duration. Historically, he converts to SR after 1-2 days. Will have him return for ECG next week to see if he is persistent. We also discussed [employer] mobile for long term monitoring Continue dofetilide 250 mcg BID Check bmet/mag/cbc today Continue diltiazem  120 mg daily with 30 mg PRN q 4 hours for heart racing  Continue Eliquis 5 mg BID  HTN Stable on current regimen  OSA  Encouraged nightly CPAP   Follow up next week for ECG.   Jorja Loa PA-C Afib Clinic [facility] [address] [place], [place] [address] [phone]

## 2023-05-16 ENCOUNTER — Ambulatory Visit (HOSPITAL_COMMUNITY)
Admission: RE | Admit: 2023-05-16 | Discharge: 2023-05-16 | Disposition: A | Discharge status: Home / Self Care | Payer: Federal, State, Local not specified - PPO | Source: Ambulatory Visit | Attending: Physician Assistant | Admitting: Physician Assistant

## 2023-05-16 DIAGNOSIS — Z79899 Other long term (current) drug therapy: Secondary | ICD-10-CM | POA: Diagnosis not present

## 2023-05-16 DIAGNOSIS — Z5181 Encounter for therapeutic drug level monitoring: Secondary | ICD-10-CM | POA: Insufficient documentation

## 2023-05-16 DIAGNOSIS — I4892 Unspecified atrial flutter: Secondary | ICD-10-CM

## 2023-05-16 DIAGNOSIS — R9431 Abnormal electrocardiogram [ECG] [EKG]: Secondary | ICD-10-CM | POA: Diagnosis not present

## 2023-05-16 LAB — BASIC METABOLIC PANEL
Anion gap: 9 (ref 5–15)
BUN: 24 mg/dL — ABNORMAL HIGH (ref 8–23)
CO2: 22 mmol/L (ref 22–32)
Calcium: 9.4 mg/dL (ref 8.9–10.3)
Chloride: 108 mmol/L (ref 98–111)
Creatinine, Ser: 1.24 mg/dL (ref 0.61–1.24)
GFR, Estimated: >60 mL/min (ref >60)
Glucose, Bld: 114 mg/dL — ABNORMAL HIGH (ref 70–99)
Potassium: 3.9 mmol/L (ref 3.5–5.1)
Sodium: 139 mmol/L (ref 135–145)

## 2023-05-16 NOTE — Progress Notes (Signed)
Patient returns for ECG today. ECG shows:  SB Vent. rate 54 BPM PR interval 158 ms QRS duration 88 ms QT/QTcB 540/512 ms (493 ms manually measured)  Patient reports he converted to SR yesterday. QT measuring longer than usual today. Patient admits he may have taken an extra morning dose of dofetilide two days ago but he is not sure. D/w EP, will have him return for another ECG next week to measure QT again. Then f/u in the AF clinic in 6 months.

## 2023-05-23 ENCOUNTER — Ambulatory Visit (HOSPITAL_COMMUNITY)
Admission: RE | Admit: 2023-05-23 | Discharge: 2023-05-23 | Disposition: A | Discharge status: Home / Self Care | Payer: Federal, State, Local not specified - PPO | Source: Ambulatory Visit | Attending: Physician Assistant | Admitting: Physician Assistant

## 2023-05-23 DIAGNOSIS — I4892 Unspecified atrial flutter: Secondary | ICD-10-CM | POA: Diagnosis not present

## 2023-05-23 DIAGNOSIS — Z5181 Encounter for therapeutic drug level monitoring: Secondary | ICD-10-CM

## 2023-05-23 NOTE — Progress Notes (Signed)
Patient returns for ECG on dofetilide. ECG shows:  SB Vent. rate 52 BPM PR interval 166 ms QRS duration 82 ms QT/QTcB 482/448 ms  His QTc has shortened and is acceptable for dofetilide. Continue present dose and follow up in 6 months.

## 2023-06-02 DIAGNOSIS — N50811 Right testicular pain: Secondary | ICD-10-CM | POA: Diagnosis not present

## 2023-06-02 DIAGNOSIS — R350 Frequency of micturition: Secondary | ICD-10-CM | POA: Diagnosis not present

## 2023-06-02 DIAGNOSIS — N5201 Erectile dysfunction due to arterial insufficiency: Secondary | ICD-10-CM | POA: Diagnosis not present

## 2023-06-02 DIAGNOSIS — N401 Enlarged prostate with lower urinary tract symptoms: Secondary | ICD-10-CM | POA: Diagnosis not present

## 2023-07-30 ENCOUNTER — Telehealth: Payer: Self-pay | Admitting: Internal Medicine

## 2023-07-30 NOTE — Telephone Encounter (Signed)
 Copied from CRM [id number]. Topic: Referral - Question >> Jul 30, 2023  2:09 PM Fuller Mandril wrote: Reason for CRM: Patient called. States he has not been seen by cardiologist in about 2 years. Was seeing Dr Johney Frame. Wants to know if provider thinks he should see cardiologist again due to heart condition. If so, will he need referral? Thank You

## 2023-07-30 NOTE — Telephone Encounter (Signed)
 Pt was last seen at afib clinic dec 2024, so he likely does not need a new referral  Please ask pt to call that office regarding time recommended for f/u, as I do not see that included in the last visit note

## 2023-08-01 ENCOUNTER — Ambulatory Visit (INDEPENDENT_AMBULATORY_CARE_PROVIDER_SITE_OTHER): Payer: Federal, State, Local not specified - PPO

## 2023-08-01 DIAGNOSIS — Z23 Encounter for immunization: Secondary | ICD-10-CM

## 2023-08-01 NOTE — Progress Notes (Signed)
 Patient visits today to receive her STANDARD DOSE FLU injection/vaccine. Patient was informed and tolerated well. Patient was notified to reach out to [place] if needed.

## 2023-10-02 ENCOUNTER — Ambulatory Visit

## 2023-10-02 ENCOUNTER — Encounter: Payer: Self-pay | Admitting: Internal Medicine

## 2023-10-02 ENCOUNTER — Ambulatory Visit: Payer: Federal, State, Local not specified - PPO | Admitting: Internal Medicine

## 2023-10-02 VITALS — BP 120/82 | HR 55 | Temp 97.8°F | Ht 72.0 in | Wt 204.0 lb

## 2023-10-02 DIAGNOSIS — Z0001 Encounter for general adult medical examination with abnormal findings: Secondary | ICD-10-CM

## 2023-10-02 DIAGNOSIS — E538 Deficiency of other specified B group vitamins: Secondary | ICD-10-CM

## 2023-10-02 DIAGNOSIS — Z77090 Contact with and (suspected) exposure to asbestos: Secondary | ICD-10-CM

## 2023-10-02 DIAGNOSIS — H1010 Acute atopic conjunctivitis, unspecified eye: Secondary | ICD-10-CM

## 2023-10-02 DIAGNOSIS — J309 Allergic rhinitis, unspecified: Secondary | ICD-10-CM

## 2023-10-02 DIAGNOSIS — R059 Cough, unspecified: Secondary | ICD-10-CM | POA: Diagnosis not present

## 2023-10-02 DIAGNOSIS — E7800 Pure hypercholesterolemia, unspecified: Secondary | ICD-10-CM

## 2023-10-02 DIAGNOSIS — Z125 Encounter for screening for malignant neoplasm of prostate: Secondary | ICD-10-CM | POA: Diagnosis not present

## 2023-10-02 DIAGNOSIS — E559 Vitamin D deficiency, unspecified: Secondary | ICD-10-CM | POA: Diagnosis not present

## 2023-10-02 DIAGNOSIS — I10 Essential (primary) hypertension: Secondary | ICD-10-CM | POA: Diagnosis not present

## 2023-10-02 DIAGNOSIS — R739 Hyperglycemia, unspecified: Secondary | ICD-10-CM | POA: Diagnosis not present

## 2023-10-02 LAB — BASIC METABOLIC PANEL WITH GFR
BUN: 21 mg/dL (ref 6–23)
CO2: 25 meq/L (ref 19–32)
Calcium: 9.6 mg/dL (ref 8.4–10.5)
Chloride: 105 meq/L (ref 96–112)
Creatinine, Ser: 1.09 mg/dL (ref 0.40–1.50)
GFR: 71.91 mL/min (ref >60.00)
Glucose, Bld: 112 mg/dL — ABNORMAL HIGH (ref 70–99)
Potassium: 4 meq/L (ref 3.5–5.1)
Sodium: 137 meq/L (ref 135–145)

## 2023-10-02 LAB — URINALYSIS, ROUTINE W REFLEX MICROSCOPIC
Bilirubin Urine: NEGATIVE
Hgb urine dipstick: NEGATIVE
Ketones, ur: NEGATIVE
Leukocytes,Ua: NEGATIVE
Nitrite: NEGATIVE
Specific Gravity, Urine: 1.025 (ref 1.000–1.030)
Total Protein, Urine: NEGATIVE
Urine Glucose: NEGATIVE
Urobilinogen, UA: 0.2 (ref 0.0–1.0)
pH: 5.5 (ref 5.0–8.0)

## 2023-10-02 LAB — CBC WITH DIFFERENTIAL/PLATELET
Basophils Absolute: 0 10*3/uL (ref 0.0–0.1)
Basophils Relative: 0.6 % (ref 0.0–3.0)
Eosinophils Absolute: 0.2 10*3/uL (ref 0.0–0.7)
Eosinophils Relative: 2.7 % (ref 0.0–5.0)
HCT: 44.8 % (ref 39.0–52.0)
Hemoglobin: 15 g/dL (ref 13.0–17.0)
Lymphocytes Relative: 30.3 % (ref 12.0–46.0)
Lymphs Abs: 2.6 10*3/uL (ref 0.7–4.0)
MCHC: 33.5 g/dL (ref 30.0–36.0)
MCV: 89.6 fl (ref 78.0–100.0)
Monocytes Absolute: 1 10*3/uL (ref 0.1–1.0)
Monocytes Relative: 12.1 % — ABNORMAL HIGH (ref 3.0–12.0)
Neutro Abs: 4.6 10*3/uL (ref 1.4–7.7)
Neutrophils Relative %: 54.3 % (ref 43.0–77.0)
Platelets: 222 10*3/uL (ref 150.0–400.0)
RBC: 4.99 Mil/uL (ref 4.22–5.81)
RDW: 13.4 % (ref 11.5–15.5)
WBC: 8.5 10*3/uL (ref 4.0–10.5)

## 2023-10-02 LAB — LIPID PANEL
Cholesterol: 151 mg/dL (ref 0–200)
HDL: 58.5 mg/dL (ref >39.00)
LDL Cholesterol: 67 mg/dL (ref 0–99)
NonHDL: 92.7
Total CHOL/HDL Ratio: 3
Triglycerides: 128 mg/dL (ref 0.0–149.0)
VLDL: 25.6 mg/dL (ref 0.0–40.0)

## 2023-10-02 LAB — HEPATIC FUNCTION PANEL
ALT: 27 U/L (ref 0–53)
AST: 20 U/L (ref 0–37)
Albumin: 4.5 g/dL (ref 3.5–5.2)
Alkaline Phosphatase: 58 U/L (ref 39–117)
Bilirubin, Direct: 0.2 mg/dL (ref 0.0–0.3)
Total Bilirubin: 1.1 mg/dL (ref 0.2–1.2)
Total Protein: 7.6 g/dL (ref 6.0–8.3)

## 2023-10-02 LAB — TSH: TSH: 1.36 u[IU]/mL (ref 0.35–5.50)

## 2023-10-02 LAB — MICROALBUMIN / CREATININE URINE RATIO
Creatinine,U: 127.3 mg/dL
Microalb Creat Ratio: 7.4 mg/g (ref 0.0–30.0)
Microalb, Ur: 0.9 mg/dL (ref 0.0–1.9)

## 2023-10-02 LAB — PSA: PSA: 1.04 ng/mL (ref 0.10–4.00)

## 2023-10-02 LAB — VITAMIN B12: Vitamin B-12: 884 pg/mL (ref 211–911)

## 2023-10-02 LAB — VITAMIN D 25 HYDROXY (VIT D DEFICIENCY, FRACTURES): VITD: 42.12 ng/mL (ref 30.00–100.00)

## 2023-10-02 LAB — HEMOGLOBIN A1C: Hgb A1c MFr Bld: 6.1 % (ref 4.6–6.5)

## 2023-10-02 MED ORDER — DOXAZOSIN MESYLATE 4 MG PO TABS: 4.0000 mg | ORAL_TABLET | Freq: Every day | ORAL | 3 refills | Status: AC

## 2023-10-02 MED ORDER — APIXABAN 5 MG PO TABS: ORAL_TABLET | ORAL | 3 refills | Status: AC

## 2023-10-02 MED ORDER — ALBUTEROL SULFATE HFA 108 (90 BASE) MCG/ACT IN AERS: 2.0000 | INHALATION_SPRAY | Freq: Four times a day (QID) | RESPIRATORY_TRACT | 1 refills | Status: AC | PRN

## 2023-10-02 MED ORDER — TRIAMCINOLONE ACETONIDE 55 MCG/ACT NA AERO: 2.0000 | INHALATION_SPRAY | Freq: Every day | NASAL | 12 refills | Status: AC

## 2023-10-02 MED ORDER — IRBESARTAN 300 MG PO TABS: 300.0000 mg | ORAL_TABLET | Freq: Every day | ORAL | 3 refills | Status: AC

## 2023-10-02 MED ORDER — DILTIAZEM HCL ER COATED BEADS 120 MG PO CP24: 120.0000 mg | ORAL_CAPSULE | Freq: Every day | ORAL | 3 refills | Status: AC

## 2023-10-02 MED ORDER — ATORVASTATIN CALCIUM 20 MG PO TABS: 20.0000 mg | ORAL_TABLET | Freq: Every day | ORAL | 3 refills | Status: AC

## 2023-10-02 NOTE — Progress Notes (Unsigned)
 Patient ID: [name] [name], male   DOB: Jan 03, 1961, 63 y.o.   MRN: [id number]         Chief Complaint:: wellness exam and Annual Exam (Gets swelling under his eyes ) , cough and hx of asbestos exposure, htn, hyperglycemia, low vit d       HPI:  [name] [name] is a 63 y.o. male here for wellness exam; up to date                        Also Does have several wks ongoing nasal allergy symptoms with clearish congestion, itch and sneezing, without fever, pain, ST, swelling or wheezing, btu does have non prod cough ongoing for several months, and hx of asbestosis.  Pt denies chest pain, increased sob or doe, wheezing, orthopnea, PND, increased LE swelling, palpitations, dizziness or syncope.   Pt denies polydipsia, polyuria, or new focal neuro s/s.      Wt Readings from Last 3 Encounters:  10/02/23 204 lb (92.5 kg)  05/08/23 203 lb (92.1 kg)  12/02/22 198 lb (89.8 kg)   BP Readings from Last 3 Encounters:  10/02/23 120/82  05/08/23 114/84  12/02/22 120/86   Immunization History  Administered Date(s) Administered   Influenza Split 02/14/2011, 02/26/2012   Influenza, High Dose Seasonal PF 03/06/2020   Influenza, Seasonal, Injecte, Preservative Fre 04/03/2013, 08/01/2023   Influenza,inj,Quad PF,6+ Mos 04/20/2018, 03/03/2019   Influenza-Unspecified 04/01/2015, 03/02/2017, 03/03/2019, 03/09/2020   Moderna Sars-Covid-2 Vaccination 08/06/2019, 09/07/2019, 05/23/2020   Pneumococcal Conjugate-13 09/22/2019   Td 01/12/2009   Tdap 09/22/2019   Zoster Recombinant(Shingrix) 03/23/2020, 09/22/2020  There are no preventive care reminders to display for this patient.    Past Medical History:  Diagnosis Date   A-fib [facility])    on Eliquis    CHEST PAIN-PRECORDIAL 05/25/2009   ERECTILE DYSFUNCTION, ORGANIC 01/16/2010   GERD (gastroesophageal reflux disease)    hx of   HYPERLIPIDEMIA 12/15/2007   on meds   HYPERTENSION 12/15/2007   on meds   HYPOKALEMIA 12/15/2007   Internal hemorrhoids     Persistent atrial fibrillation (HCC)    a. prior ablation/flecainide ; placed on Tikosyn  07/2017.   POSTURAL LIGHTHEADEDNESS 07/06/2008   Seasonal allergies    Sinus bradycardia    Sleep apnea    does not use CPAP   Visit for monitoring Tikosyn  therapy 07/03/2017   Past Surgical History:  Procedure Laterality Date   ATRIAL FIBRILLATION ABLATION N/A 08/13/2011   Procedure: ATRIAL FIBRILLATION ABLATION;  Surgeon: Jolly Needle, MD;  Location: [facility] CATH LAB;  Service: Cardiovascular;  Laterality: N/A;   ATRIAL FIBRILLATION ABLATION  10/31/2016   ATRIAL FIBRILLATION ABLATION N/A 10/31/2016   Procedure: Atrial Fibrillation Ablation;  Surgeon: Jolly Needle, MD;  Location: [facility] INVASIVE CV LAB;  Service: Cardiovascular;  Laterality: N/A;   ATRIAL FLUTTER ABLATION N/A 12/27/2011   CTI repeat ablation by Dr Nunzio Belch   CARDIOVERSION  11/21/2011   Procedure: CARDIOVERSION;  Surgeon: Darlis Eisenmenger, MD;  Location: [facility] OR;  Service: Cardiovascular;  Laterality: N/A;   CARDIOVERSION N/A 12/02/2016   Procedure: CARDIOVERSION;  Surgeon: Hazle Lites, MD;  Location: [facility] ENDOSCOPY;  Service: Cardiovascular;  Laterality: N/A;   CHALAZION EXCISION Right    COLONOSCOPY  2017   SA-MAC-suprep (good)-TA x 2   ELBOW SURGERY Right early 1980s   bone spur/chip; "opened me up"   INGUINAL HERNIA REPAIR Right 11/2006   POLYPECTOMY  2017   TA x 2   TEE WITHOUT CARDIOVERSION  08/12/2011   Procedure: TRANSESOPHAGEAL ECHOCARDIOGRAM (TEE);  Surgeon: Elmyra Haggard, MD;  Location: [facility] ENDOSCOPY;  Service: Cardiovascular;  Laterality: N/A;   TEE WITHOUT CARDIOVERSION N/A 10/30/2016   Procedure: TRANSESOPHAGEAL ECHOCARDIOGRAM (TEE);  Surgeon: Loyde Rule, MD;  Location: [facility] ENDOSCOPY;  Service: Cardiovascular;  Laterality: N/A;   TEE WITHOUT CARDIOVERSION N/A 12/02/2016   Procedure: TRANSESOPHAGEAL ECHOCARDIOGRAM (TEE);  Surgeon: Hazle Lites, MD;  Location: [facility] ENDOSCOPY;  Service: Cardiovascular;  Laterality: N/A;     reports that he quit smoking about 12 years ago. His smoking use included cigarettes. He started smoking about 27 years ago. He has a 1.5 pack-year smoking history. He has never used smokeless tobacco. He reports current alcohol use of about 1.0 - 2.0 standard drink of alcohol per week. He reports that he does not use drugs. family history includes Alcohol abuse in his father; Heart disease in his sister; Other in his father; Sleep apnea in his sister; Stroke in his mother; Thyroid  disease in his sister. Allergies  Allergen Reactions   Ibuprofen Palpitations and Other (See Comments)    Rapid heart beat   Current Outpatient Medications on File Prior to Visit  Medication Sig Dispense Refill   Arginine 500 MG CAPS Take 500 mg by mouth daily.     Ascorbic Acid (VITAMIN C) 1000 MG tablet Take 1,000 mg by mouth daily.      CHOLECALCIFEROL PO Take 2,000 mcg by mouth in the morning and at bedtime.     diltiazem  (CARDIZEM ) 30 MG tablet Take 1 tablet every 4 hours AS NEEDED for heart rate over 100 45 tablet 2   dofetilide  (TIKOSYN ) 250 MCG capsule Take 1 capsule (250 mcg total) by mouth 2 (two) times daily. 180 capsule 2   loratadine  (CLARITIN ) 10 MG tablet Take 1 tablet by mouth once daily 90 tablet 3   Lysine HCl 500 MG TABS Take 500 mg by mouth daily.      Multiple Vitamins-Minerals (MULTIVITAL) tablet Take 1 tablet by mouth daily.     nitroGLYCERIN  (NITROSTAT ) 0.4 MG SL tablet Place 1 tablet (0.4 mg total) under the tongue every 5 (five) minutes as needed for chest pain. 30 tablet 0   potassium chloride  (KLOR-CON ) 10 MEQ tablet Take 1 tablet (10 mEq total) by mouth daily. 90 tablet 3   No current facility-administered medications on file prior to visit.        ROS:  All others reviewed and negative.  Objective        PE:  BP 120/82 (BP Location: Left Arm, Patient Position: Sitting, Cuff Size: Normal)   Pulse (!) 55   Temp 97.8 F (36.6 C) (Oral)   Ht 6' (1.829 m)   Wt 204 lb (92.5 kg)    SpO2 100%   BMI 27.67 kg/m                 Constitutional: Pt appears in NAD               HENT: Head: NCAT.                Right Ear: External ear normal.                 Left Ear: External ear normal.                Eyes: . Pupils are equal, round, and reactive to light. Conjunctivae and EOM are normal  Nose: without d/c or deformity               Neck: Neck supple. Gross normal ROM               Cardiovascular: Normal rate and regular rhythm.                 Pulmonary/Chest: Effort normal and breath sounds without rales or wheezing.                Abd:  Soft, NT, ND, + BS, no organomegaly               Neurological: Pt is alert. At baseline orientation, motor grossly intact               Skin: Skin is warm. No rashes, no other new lesions, LE edema - none               Psychiatric: Pt behavior is normal without agitation   Micro: none  Cardiac tracings I have personally interpreted today:  none  Pertinent Radiological findings (summarize): none   Lab Results  Component Value Date   WBC 8.5 10/02/2023   HGB 15.0 10/02/2023   HCT 44.8 10/02/2023   PLT 222.0 10/02/2023   GLUCOSE 112 (H) 10/02/2023   CHOL 151 10/02/2023   TRIG 128.0 10/02/2023   HDL 58.50 10/02/2023   LDLDIRECT 42.0 06/17/2022   LDLCALC 67 10/02/2023   ALT 27 10/02/2023   AST 20 10/02/2023   NA 137 10/02/2023   K 4.0 10/02/2023   CL 105 10/02/2023   CREATININE 1.09 10/02/2023   BUN 21 10/02/2023   CO2 25 10/02/2023   TSH 1.36 10/02/2023   PSA 1.04 10/02/2023   INR 1.8 (A) 09/11/2018   HGBA1C 6.1 10/02/2023   MICROALBUR 0.9 10/02/2023   Assessment/Plan:  [name] [name] is a 63 y.o. Black or African American [2] male with  has a past medical history of A-fib [facility]), CHEST PAIN-PRECORDIAL (05/25/2009), ERECTILE DYSFUNCTION, ORGANIC (01/16/2010), GERD (gastroesophageal reflux disease), HYPERLIPIDEMIA (12/15/2007), HYPERTENSION (12/15/2007), HYPOKALEMIA (12/15/2007), Internal hemorrhoids,  Persistent atrial fibrillation (HCC), POSTURAL LIGHTHEADEDNESS (07/06/2008), Seasonal allergies, Sinus bradycardia, Sleep apnea, and Visit for monitoring Tikosyn  therapy (07/03/2017).  Encounter for well adult exam with abnormal findings Age and sex appropriate education and counseling updated with regular exercise and diet Referrals for preventative services - none needed Immunizations addressed - none needed Smoking counseling  - none needed Evidence for depression or other mood disorder - none significant Most recent labs reviewed. I have personally reviewed and have noted: 1) the patient's medical and social history 2) The patient's current medications and supplements 3) The patient's height, weight, and BMI have been recorded in the chart   HLD (hyperlipidemia) Lab Results  Component Value Date   LDLCALC 67 10/02/2023   Stable, pt to continue current statin lipitor 20 mg qd   Essential hypertension BP Readings from Last 3 Encounters:  10/02/23 120/82  05/08/23 114/84  12/02/22 120/86   Stable, pt to continue medical treatment card cd 120 every day,avapro  300 mg qd   Hyperglycemia Lab Results  Component Value Date   HGBA1C 6.1 10/02/2023   Stable, pt to continue current medical treatment  - diet, wt control   Vitamin D  deficiency Last vitamin D  Lab Results  Component Value Date   VD25OH 42.12 10/02/2023   Stable, cont oral replacement   Cough Possible allergy but also hx of asbestos - for cxr  Allergic rhinoconjunctivitis Mild  to mod, for otc zaditor and nasacort  asd,  to f/u any worsening symptoms or concerns  Followup: Return in about 1 year (around 10/01/2024).  Rosalia Colonel, MD 10/04/2023 7:47 PM [facility]

## 2023-10-02 NOTE — Patient Instructions (Addendum)
 Ok to try the OTC Zaditor for the eye allergies, and Nasacort  for the nasal  Please continue all other medications as before, and refills have been done if requested.  Please have the pharmacy call with any other refills you may need.  Please continue your efforts at being more active, low cholesterol diet, and weight control.  You are otherwise up to date with prevention measures today.  Please keep your appointments with your specialists as you may have planned  Please go to the XRAY Department in the first floor for the x-ray testing  Please go to the LAB at the blood drawing area for the tests to be done  You will be contacted by phone if any changes need to be made immediately.  Otherwise, you will receive a letter about your results with an explanation, but please check with MyChart first.  Please make an Appointment to return for your 1 year visit, or sooner if needed

## 2023-10-02 NOTE — Progress Notes (Signed)
 The test results show that your current treatment is OK, as the tests are stable.  Please continue the same plan.  There is no other need for change of treatment or further evaluation based on these results, at this time.  thanks

## 2023-10-04 ENCOUNTER — Encounter: Payer: Self-pay | Admitting: Internal Medicine

## 2023-10-04 DIAGNOSIS — H1010 Acute atopic conjunctivitis, unspecified eye: Secondary | ICD-10-CM | POA: Insufficient documentation

## 2023-10-04 NOTE — Assessment & Plan Note (Signed)

## 2023-10-04 NOTE — Assessment & Plan Note (Signed)
 Last vitamin D  Lab Results  Component Value Date   VD25OH 42.12 10/02/2023   Stable, cont oral replacement

## 2023-10-04 NOTE — Assessment & Plan Note (Signed)
 Possible allergy but also hx of asbestos - for cxr

## 2023-10-04 NOTE — Assessment & Plan Note (Signed)
 Lab Results  Component Value Date   HGBA1C 6.1 10/02/2023   Stable, pt to continue current medical treatment  - diet, wt control

## 2023-10-04 NOTE — Assessment & Plan Note (Signed)
 Lab Results  Component Value Date   LDLCALC 67 10/02/2023   Stable, pt to continue current statin lipitor 20 mg qd

## 2023-10-04 NOTE — Assessment & Plan Note (Signed)
 BP Readings from Last 3 Encounters:  10/02/23 120/82  05/08/23 114/84  12/02/22 120/86   Stable, pt to continue medical treatment card cd 120 every day,avapro  300 mg qd

## 2023-10-04 NOTE — Assessment & Plan Note (Signed)
 Mild to mod, for otc zaditor and nasacort  asd,  to f/u any worsening symptoms or concerns

## 2023-10-06 ENCOUNTER — Encounter: Payer: Self-pay | Admitting: Internal Medicine

## 2023-11-14 ENCOUNTER — Ambulatory Visit (HOSPITAL_COMMUNITY)
Admission: RE | Admit: 2023-11-14 | Discharge: 2023-11-14 | Disposition: A | Discharge status: Home / Self Care | Payer: Federal, State, Local not specified - PPO | Source: Ambulatory Visit | Attending: Physician Assistant | Admitting: Physician Assistant

## 2023-11-14 ENCOUNTER — Encounter (HOSPITAL_COMMUNITY): Payer: Self-pay | Admitting: Physician Assistant

## 2023-11-14 VITALS — BP 132/90 | HR 53 | Ht 72.0 in | Wt 201.4 lb

## 2023-11-14 DIAGNOSIS — Z79899 Other long term (current) drug therapy: Secondary | ICD-10-CM

## 2023-11-14 DIAGNOSIS — I480 Paroxysmal atrial fibrillation: Secondary | ICD-10-CM

## 2023-11-14 DIAGNOSIS — Z5181 Encounter for therapeutic drug level monitoring: Secondary | ICD-10-CM

## 2023-11-14 DIAGNOSIS — I4819 Other persistent atrial fibrillation: Secondary | ICD-10-CM | POA: Diagnosis not present

## 2023-11-14 DIAGNOSIS — D6869 Other thrombophilia: Secondary | ICD-10-CM

## 2023-11-14 NOTE — Progress Notes (Signed)
 Primary Care Physician: Roslyn Coombe, MD Referring Physician: Metta Actis is a 63 y.o. male with a h/o afib in the afib clinic for Tikosyn  surveillance. He reports that he has not noted any sustained afib. He will have an infrequent breakthrough episode, rarely lasts more than one day.  He is being compliant with  Tikosyn . and  eliquis  with a CHA2DS2VASc score of 1. No bleeding issues.  He presented in afib this am, but by auscultation was back in SR by end of the appointment. He has had his covid shots this  last spring.   F/u in  afib clinic, 07/12/20. He has been maintaining SR but feels draggy in the am. His BP is soft in the am and he may not need the low dose amlodipine . He continues on tikosyn  and eliquis .   F/u in afib clinic, 01/24/21 for Tikosyn  surveillance.  He presented for a colonoscopy last week and was in afib with RVR so the procedure was cancelled and referred back here. He reports that he returned to SR later that day. He has h/o of intermittent breakthrough episodes that last less than 24 hours. He is being compliant with Tikosyn  and eliqiuis 5 mg bid. He states that he had to drink the solution for clean out the evening before and again the next morning. He took his meds around 9 am but had the feeling he was in afib before he left the house for the procedure that afternoon. Probably, he had poor absorption of meds with the continuation of washout  prep the next morning.   Seeing back in afib clinic 07/25/21 for Tikosyn  surveillance. He had an episode 07/12/21 that lasted 2 days triggered by work stress. He is in sinus brady today. [facility] notified pt that they will no longer be carrying dofetilide . We will assist pt to find another avenue to obtain drug.  Qt stable.   F/u in afib clinic, 05/08/22. He is doing well staying in SR. He did have COVID one month ago and feels he is recovered. Compliant with tikosyn .   Follow up in the AF clinic 10/31/22. Patient reports  that he has done well since his last visit. He has not had any symptoms of afib in the interim. No bleeding issues on anticoagulation. He is retiring from his job tomorrow.   Follow up in the AF clinic 05/08/23. Patient reports that he feels well. He is surprised that he is in afib today. There were no specific triggers that he could identify except that he has not been sleeping well. No bleeding issues on anticoagulation.   Follow up 11/14/23. Patient returns for follow up for atrial fibrillation and dofetilide  monitoring. He reports that he has done well since his last visit. He remains in SR. No bleeding issues on anticoagulation.   Today, he  denies symptoms of palpitations, chest pain, shortness of breath, orthopnea, PND, lower extremity edema, dizziness, presyncope, syncope, snoring, daytime somnolence, bleeding, or neurologic sequela. The patient is tolerating medications without difficulties and is otherwise without complaint today.    Past Medical History:  Diagnosis Date   A-fib [facility])    on Eliquis    CHEST PAIN-PRECORDIAL 05/25/2009   ERECTILE DYSFUNCTION, ORGANIC 01/16/2010   GERD (gastroesophageal reflux disease)    hx of   HYPERLIPIDEMIA 12/15/2007   on meds   HYPERTENSION 12/15/2007   on meds   HYPOKALEMIA 12/15/2007   Internal hemorrhoids    Persistent atrial fibrillation (HCC)  a. prior ablation/flecainide ; placed on Tikosyn  07/2017.   POSTURAL LIGHTHEADEDNESS 07/06/2008   Seasonal allergies    Sinus bradycardia    Sleep apnea    does not use CPAP   Visit for monitoring Tikosyn  therapy 07/03/2017    Current Outpatient Medications  Medication Sig Dispense Refill   albuterol  (VENTOLIN  HFA) 108 (90 Base) MCG/ACT inhaler Inhale 2 puffs into the lungs every 6 (six) hours as needed for wheezing or shortness of breath. 8 g 1   apixaban  (ELIQUIS ) 5 MG TABS tablet TAKE 1 TABLET BY MOUTH TWICE DAILY. APPOINTMENT REQUIRED FOR FURTHER REFILLS [phone]. 180 tablet 3    Arginine 500 MG CAPS Take 500 mg by mouth daily.     Ascorbic Acid (VITAMIN C) 1000 MG tablet Take 1,000 mg by mouth daily.      atorvastatin  (LIPITOR) 20 MG tablet Take 1 tablet (20 mg total) by mouth daily. 90 tablet 3   CHOLECALCIFEROL PO Take 2,000 mcg by mouth in the morning and at bedtime.     diltiazem  (CARDIZEM  CD) 120 MG 24 hr capsule Take 1 capsule (120 mg total) by mouth daily. 90 capsule 3   diltiazem  (CARDIZEM ) 30 MG tablet Take 1 tablet every 4 hours AS NEEDED for heart rate over 100 45 tablet 2   dofetilide  (TIKOSYN ) 250 MCG capsule Take 1 capsule (250 mcg total) by mouth 2 (two) times daily. 180 capsule 2   doxazosin  (CARDURA ) 4 MG tablet Take 1 tablet (4 mg total) by mouth at bedtime. 90 tablet 3   irbesartan  (AVAPRO ) 300 MG tablet Take 1 tablet (300 mg total) by mouth daily. 90 tablet 3   loratadine  (CLARITIN ) 10 MG tablet Take 1 tablet by mouth once daily 90 tablet 3   Lysine HCl 500 MG TABS Take 500 mg by mouth daily.      Multiple Vitamins-Minerals (MULTIVITAL) tablet Take 1 tablet by mouth daily.     nitroGLYCERIN  (NITROSTAT ) 0.4 MG SL tablet Place 1 tablet (0.4 mg total) under the tongue every 5 (five) minutes as needed for chest pain. 30 tablet 0   potassium chloride  (KLOR-CON ) 10 MEQ tablet Take 1 tablet (10 mEq total) by mouth daily. 90 tablet 3   triamcinolone  (NASACORT ) 55 MCG/ACT AERO nasal inhaler Place 2 sprays into the nose daily. 1 each 12   No current facility-administered medications for this encounter.    ROS- All systems are reviewed and negative except as per the HPI above  Physical Exam: Vitals:   11/14/23 1039  BP: (!) 132/90  Pulse: (!) 53  Weight: 91.4 kg  Height: 6' (1.829 m)     Wt Readings from Last 3 Encounters:  11/14/23 91.4 kg  10/02/23 92.5 kg  05/08/23 92.1 kg    GEN: Well nourished, well developed in no acute distress CARDIAC: Regular rate and rhythm, no murmurs, rubs, gallops RESPIRATORY:  Clear to auscultation without rales,  wheezing or rhonchi  ABDOMEN: Soft, non-tender, non-distended EXTREMITIES:  No edema; No deformity    ECG today demonstrates SB Vent. rate 53 BPM PR interval 156 ms QRS duration 82 ms QT/QTcB 510/478 ms   CHA2DS2-VASc Score = 1  The patient's score is based upon: CHF History: 0 HTN History: 1 Diabetes History: 0 Stroke History: 0 Vascular Disease History: 0 Age Score: 0 Gender Score: 0       ASSESSMENT AND PLAN: Persistent Atrial Fibrillation (ICD10:  I48.19) The patient's CHA2DS2-VASc score is 1, indicating a 0.6% annual risk of stroke.   Previously  failed flecainide  S/p afib ablation in 2018 S/p dofetilide  loading 2019 Patient appears to be maintaining SR Continue dofetilide  250 mcg BID Continue diltiazem  120 mg daily with 30 mg PRN q 4 hours for heart racing. Continue Eliquis  5 mg BID  High Risk Medication Monitoring (ICD 10: Z79.899) QT interval on ECG acceptable for dofetilide  monitoring. Recent bmet reviewed, check magnesium today.   HTN Stable on current regimen  OSA  Encouraged nightly CPAP   Follow up with EP to establish care in 6 months (former [facility] pt). AF clinic in one year.    Myrtha Ates PA-C Afib Clinic [facility] [address] [place], [place] [address] [phone]

## 2023-11-15 LAB — MAGNESIUM: Magnesium: 2 mg/dL (ref 1.6–2.3)

## 2023-11-17 ENCOUNTER — Ambulatory Visit (HOSPITAL_COMMUNITY): Payer: Self-pay | Admitting: Physician Assistant

## 2023-12-08 ENCOUNTER — Other Ambulatory Visit (HOSPITAL_COMMUNITY): Payer: Self-pay | Admitting: *Deleted

## 2023-12-08 MED ORDER — DOFETILIDE 250 MCG PO CAPS: 250.0000 ug | ORAL_CAPSULE | Freq: Two times a day (BID) | ORAL | 2 refills | Status: DC

## 2023-12-08 MED ORDER — DOFETILIDE 250 MCG PO CAPS: 250.0000 ug | ORAL_CAPSULE | Freq: Two times a day (BID) | ORAL | 2 refills | Status: AC

## 2023-12-24 ENCOUNTER — Telehealth: Payer: Self-pay | Admitting: Internal Medicine

## 2023-12-24 NOTE — Telephone Encounter (Signed)
 Copied from CRM #[id number]. Topic: Appointments - Scheduling Inquiry for Clinic >> Dec 23, 2023  5:39 PM [name] [name] wrote: Reason for CRM: Patient wants to schedule a MRI since his X-ray came back negative. He states that he is still having pain under rib cage.

## 2023-12-25 NOTE — Telephone Encounter (Signed)
 No previous abd films were done may1 - only cxr  Needs OV if having abd pain as this would be a new problem I believe

## 2023-12-29 ENCOUNTER — Ambulatory Visit: Payer: Self-pay

## 2023-12-29 NOTE — Telephone Encounter (Signed)
 FYI Only or Action Required?: Action required by provider: request for appointment.  Patient was last seen in primary care on 10/02/2023 by Norleen Lynwood ORN, MD.  Called Nurse Triage reporting No chief complaint on file..  Symptoms began several months ago.  Interventions attempted: Nothing.  Symptoms are: unchanged.  Triage Disposition: See Physician Within 24 Hours  Patient/caregiver understands and will follow disposition?: YesCopied from CRM #[id number]. Topic: Clinical - Red Word Triage >> Dec 29, 2023  2:57 PM Martinique E wrote: [facility] that prompted transfer to Nurse Triage: Rib cage pain. Patient states it feels uncomfortable and almost like he's having spasms with tightness and movement. Reason for Disposition  Nursing judgment or information in reference  Answer Assessment - Initial Assessment Questions 1. REASON FOR CALL: What is your main concern right now?     Right sided rib pain  2. ONSET: When did the pain start?     Some time between March and May 3. SEVERITY: How bad is the pain?     4 4. FUNCTIONAL IMPAIRMENT: How have things been going for you overall? Have you had more difficulty than usual doing your normal daily activities? (e.g., self-care, school, work, interactions)     No change 5. RELIEVING AND AGGRAVATING FACTORS: What makes it better or worse? (e.g., certain activities, rest)     denies 6. FEVER: Do you have a fever?     na 7. OTHER SYMPTOMS: Do you have any other new symptoms?     denies 8. TREATMENTS AND RESPONSE: What have you done so far to try to make this better? What medicines have you used?     Nothing    Pt has been dealing with this pain for several months. Pt had physical in May and concern was addressed. Pt had xray that was normal/ Pt is concerned because pain wont go away. Pt stated it feels internal, about fist size. Pt feels pain worse at night. Pt gets use to pain and takes no medication. No known  injury.  Protocols used: No Guideline Available-[facility]

## 2023-12-30 ENCOUNTER — Ambulatory Visit: Admitting: Internal Medicine

## 2023-12-30 ENCOUNTER — Encounter: Payer: Self-pay | Admitting: Internal Medicine

## 2023-12-30 VITALS — BP 130/94 | HR 58 | Temp 98.4°F | Ht 72.0 in | Wt 204.6 lb

## 2023-12-30 DIAGNOSIS — R1013 Epigastric pain: Secondary | ICD-10-CM

## 2023-12-30 DIAGNOSIS — I10 Essential (primary) hypertension: Secondary | ICD-10-CM | POA: Diagnosis not present

## 2023-12-30 DIAGNOSIS — E559 Vitamin D deficiency, unspecified: Secondary | ICD-10-CM | POA: Diagnosis not present

## 2023-12-30 DIAGNOSIS — R079 Chest pain, unspecified: Secondary | ICD-10-CM | POA: Diagnosis not present

## 2023-12-30 MED ORDER — LORATADINE 10 MG PO TABS: 10.0000 mg | ORAL_TABLET | Freq: Every day | ORAL | 3 refills | Status: AC

## 2023-12-30 MED ORDER — POLYETHYLENE GLYCOL 3350 17 GM/SCOOP PO POWD: 17.0000 g | Freq: Two times a day (BID) | ORAL | 1 refills | Status: AC | PRN

## 2023-12-30 NOTE — Patient Instructions (Signed)
 Please take all new medication as prescribed - the miralax  once per day for constipation  Please continue all other medications as before, and refills have been done if requested.  Please have the pharmacy call with any other refills you may need.  Please keep your appointments with your specialists as you may have planned  You will be contacted regarding the referral for: CT chest   We can hold on lab tests today

## 2023-12-30 NOTE — Assessment & Plan Note (Signed)
 Chronic persistent, etiology unclear, may 2025 cxr neg for acute, now for CT chest wo

## 2023-12-30 NOTE — Assessment & Plan Note (Signed)
 C/w constipation  -for miralax  daily

## 2023-12-30 NOTE — Progress Notes (Signed)
 Patient ID: [name] [name], male   DOB: [birth year], 63 y.o.   MRN: [id number]        Chief Complaint: follow up right lower chest pain, constipation, low vit d, htn       HPI:  [name] [name] is a 63 y.o. male here with persistent now more frequent and severe right lower anterior chest pain at the lowest right costal margin at the sternum.  CXR may 2025 neg for acute.  Pain is sharp, pleuritic and non positional and non exertional.  No fever, cough and Pt denies increased sob or doe, wheezing, orthopnea, PND, increased LE swelling, palpitations, dizziness or syncope.   Pt denies polydipsia, polyuria, or new focal neuro s/s.   Denies worsening reflux, abd pain, dysphagia, n/v, or blood but has had crampy pains and constipation off and on mild intermittent.         Wt Readings from Last 3 Encounters:  12/30/23 204 lb 9.6 oz (92.8 kg)  11/14/23 201 lb 6.4 oz (91.4 kg)  10/02/23 204 lb (92.5 kg)   BP Readings from Last 3 Encounters:  12/30/23 (!) 130/94  11/14/23 (!) 132/90  10/02/23 120/82         Past Medical History:  Diagnosis Date   A-fib [facility])    on Eliquis    CHEST PAIN-PRECORDIAL 05/25/2009   ERECTILE DYSFUNCTION, ORGANIC 01/16/2010   GERD (gastroesophageal reflux disease)    hx of   HYPERLIPIDEMIA 12/15/2007   on meds   HYPERTENSION 12/15/2007   on meds   HYPOKALEMIA 12/15/2007   Internal hemorrhoids    Persistent atrial fibrillation (HCC)    a. prior ablation/flecainide ; placed on Tikosyn  07/2017.   POSTURAL LIGHTHEADEDNESS 07/06/2008   Seasonal allergies    Sinus bradycardia    Sleep apnea    does not use CPAP   Visit for monitoring Tikosyn  therapy 07/03/2017   Past Surgical History:  Procedure Laterality Date   ATRIAL FIBRILLATION ABLATION N/A 08/13/2011   Procedure: ATRIAL FIBRILLATION ABLATION;  Surgeon: Lynwood Rakers, MD;  Location: [facility] CATH LAB;  Service: Cardiovascular;  Laterality: N/A;   ATRIAL FIBRILLATION ABLATION  10/31/2016   ATRIAL FIBRILLATION  ABLATION N/A 10/31/2016   Procedure: Atrial Fibrillation Ablation;  Surgeon: Rakers Lynwood, MD;  Location: [facility] INVASIVE CV LAB;  Service: Cardiovascular;  Laterality: N/A;   ATRIAL FLUTTER ABLATION N/A 12/27/2011   CTI repeat ablation by Dr Rakers   CARDIOVERSION  11/21/2011   Procedure: CARDIOVERSION;  Surgeon: Ezra GORMAN Shuck, MD;  Location: [facility] OR;  Service: Cardiovascular;  Laterality: N/A;   CARDIOVERSION N/A 12/02/2016   Procedure: CARDIOVERSION;  Surgeon: Mona Vinie BROCKS, MD;  Location: [facility] ENDOSCOPY;  Service: Cardiovascular;  Laterality: N/A;   CHALAZION EXCISION Right    COLONOSCOPY  2017   SA-MAC-suprep (good)-TA x 2   ELBOW SURGERY Right early 1980s   bone spur/chip; opened me up   INGUINAL HERNIA REPAIR Right 11/2006   POLYPECTOMY  2017   TA x 2   TEE WITHOUT CARDIOVERSION  08/12/2011   Procedure: TRANSESOPHAGEAL ECHOCARDIOGRAM (TEE);  Surgeon: Vina LULLA Gull, MD;  Location: [facility] ENDOSCOPY;  Service: Cardiovascular;  Laterality: N/A;   TEE WITHOUT CARDIOVERSION N/A 10/30/2016   Procedure: TRANSESOPHAGEAL ECHOCARDIOGRAM (TEE);  Surgeon: Delford Maude BROCKS, MD;  Location: [facility] ENDOSCOPY;  Service: Cardiovascular;  Laterality: N/A;   TEE WITHOUT CARDIOVERSION N/A 12/02/2016   Procedure: TRANSESOPHAGEAL ECHOCARDIOGRAM (TEE);  Surgeon: Mona Vinie BROCKS, MD;  Location: [facility] ENDOSCOPY;  Service: Cardiovascular;  Laterality: N/A;  reports that he quit smoking about 13 years ago. His smoking use included cigarettes. He started smoking about 28 years ago. He has a 1.5 pack-year smoking history. He has never used smokeless tobacco. He reports current alcohol use of about 1.0 - 2.0 standard drink of alcohol per week. He reports that he does not use drugs. family history includes Alcohol abuse in his father; Heart disease in his sister; Other in his father; Sleep apnea in his sister; Stroke in his mother; Thyroid  disease in his sister. Allergies  Allergen Reactions   Ibuprofen Palpitations and  Other (See Comments)    Rapid heart beat   Current Outpatient Medications on File Prior to Visit  Medication Sig Dispense Refill   albuterol  (VENTOLIN  HFA) 108 (90 Base) MCG/ACT inhaler Inhale 2 puffs into the lungs every 6 (six) hours as needed for wheezing or shortness of breath. 8 g 1   apixaban  (ELIQUIS ) 5 MG TABS tablet TAKE 1 TABLET BY MOUTH TWICE DAILY. APPOINTMENT REQUIRED FOR FURTHER REFILLS [phone]. 180 tablet 3   Arginine 500 MG CAPS Take 500 mg by mouth daily.     Ascorbic Acid (VITAMIN C) 1000 MG tablet Take 1,000 mg by mouth daily.      atorvastatin  (LIPITOR) 20 MG tablet Take 1 tablet (20 mg total) by mouth daily. 90 tablet 3   CHOLECALCIFEROL PO Take 2,000 mcg by mouth in the morning and at bedtime.     diltiazem  (CARDIZEM  CD) 120 MG 24 hr capsule Take 1 capsule (120 mg total) by mouth daily. 90 capsule 3   diltiazem  (CARDIZEM ) 30 MG tablet Take 1 tablet every 4 hours AS NEEDED for heart rate over 100 45 tablet 2   dofetilide  (TIKOSYN ) 250 MCG capsule Take 1 capsule (250 mcg total) by mouth 2 (two) times daily. 180 capsule 2   doxazosin  (CARDURA ) 4 MG tablet Take 1 tablet (4 mg total) by mouth at bedtime. 90 tablet 3   irbesartan  (AVAPRO ) 300 MG tablet Take 1 tablet (300 mg total) by mouth daily. 90 tablet 3   Lysine HCl 500 MG TABS Take 500 mg by mouth daily.      Multiple Vitamins-Minerals (MULTIVITAL) tablet Take 1 tablet by mouth daily.     nitroGLYCERIN  (NITROSTAT ) 0.4 MG SL tablet Place 1 tablet (0.4 mg total) under the tongue every 5 (five) minutes as needed for chest pain. 30 tablet 0   triamcinolone  (NASACORT ) 55 MCG/ACT AERO nasal inhaler Place 2 sprays into the nose daily. 1 each 12   potassium chloride  (KLOR-CON ) 10 MEQ tablet Take 1 tablet (10 mEq total) by mouth daily. 90 tablet 3   No current facility-administered medications on file prior to visit.        ROS:  All others reviewed and negative.  Objective        PE:  BP (!) 130/94   Pulse (!) 58    Temp 98.4 F (36.9 C)   Ht 6' (1.829 m)   Wt 204 lb 9.6 oz (92.8 kg)   SpO2 99%   BMI 27.75 kg/m                 Constitutional: Pt appears in NAD               HENT: Head: NCAT.                Right Ear: External ear normal.  Left Ear: External ear normal.                Eyes: . Pupils are equal, round, and reactive to light. Conjunctivae and EOM are normal               Nose: without d/c or deformity               Neck: Neck supple. Gross normal ROM               Cardiovascular: Normal rate and regular rhythm.                 Pulmonary/Chest: Effort normal and breath sounds without rales or wheezing.                Abd:  Soft, NT, ND, + BS, no organomegaly               Neurological: Pt is alert. At baseline orientation, motor grossly intact               Skin: Skin is warm. No rashes, no other new lesions, LE edema - none               Psychiatric: Pt behavior is normal without agitation   Micro: none  Cardiac tracings I have personally interpreted today:  none  Pertinent Radiological findings (summarize): none   Lab Results  Component Value Date   WBC 8.5 10/02/2023   HGB 15.0 10/02/2023   HCT 44.8 10/02/2023   PLT 222.0 10/02/2023   GLUCOSE 112 (H) 10/02/2023   CHOL 151 10/02/2023   TRIG 128.0 10/02/2023   HDL 58.50 10/02/2023   LDLDIRECT 42.0 06/17/2022   LDLCALC 67 10/02/2023   ALT 27 10/02/2023   AST 20 10/02/2023   NA 137 10/02/2023   K 4.0 10/02/2023   CL 105 10/02/2023   CREATININE 1.09 10/02/2023   BUN 21 10/02/2023   CO2 25 10/02/2023   TSH 1.36 10/02/2023   PSA 1.04 10/02/2023   INR 1.8 (A) 09/11/2018   HGBA1C 6.1 10/02/2023   MICROALBUR 0.9 10/02/2023   Assessment/Plan:  TYRE BEAVER is a 63 y.o. Black or African American [2] male with  has a past medical history of A-fib [facility]), CHEST PAIN-PRECORDIAL (05/25/2009), ERECTILE DYSFUNCTION, ORGANIC (01/16/2010), GERD (gastroesophageal reflux disease), HYPERLIPIDEMIA (12/15/2007),  HYPERTENSION (12/15/2007), HYPOKALEMIA (12/15/2007), Internal hemorrhoids, Persistent atrial fibrillation (HCC), POSTURAL LIGHTHEADEDNESS (07/06/2008), Seasonal allergies, Sinus bradycardia, Sleep apnea, and Visit for monitoring Tikosyn  therapy (07/03/2017).  Chest pain Chronic persistent, etiology unclear, may 2025 cxr neg for acute, now for CT chest wo  Abdominal pain, epigastric C/w constipation  -for miralax  daily  Vitamin D  deficiency Last vitamin D  Lab Results  Component Value Date   VD25OH 42.12 10/02/2023   Stable, cont oral replacement   Essential hypertension BP Readings from Last 3 Encounters:  12/30/23 (!) 130/94  11/14/23 (!) 132/90  10/02/23 120/82   Uncontrolled, likely reactive, pt to continue medical treatment cardizem  cd 120 mg every day, avapro  300 mg qd   Followup: Return if symptoms worsen or fail to improve.  Lynwood Rush, MD 12/30/2023 7:42 PM [facility]  Primary Care - [facility] Internal Medicine

## 2023-12-30 NOTE — Telephone Encounter (Signed)
 Patient following up today with Dr.John

## 2023-12-30 NOTE — Assessment & Plan Note (Signed)
 Last vitamin D  Lab Results  Component Value Date   VD25OH 42.12 10/02/2023   Stable, cont oral replacement

## 2023-12-30 NOTE — Assessment & Plan Note (Signed)
 BP Readings from Last 3 Encounters:  12/30/23 (!) 130/94  11/14/23 (!) 132/90  10/02/23 120/82   Uncontrolled, likely reactive, pt to continue medical treatment cardizem  cd 120 mg every day, avapro  300 mg qd

## 2024-01-05 ENCOUNTER — Ambulatory Visit
Admission: RE | Admit: 2024-01-05 | Discharge: 2024-01-05 | Disposition: A | Discharge status: Home / Self Care | Source: Ambulatory Visit | Attending: Internal Medicine | Admitting: Internal Medicine

## 2024-01-05 DIAGNOSIS — J479 Bronchiectasis, uncomplicated: Secondary | ICD-10-CM | POA: Diagnosis not present

## 2024-01-05 DIAGNOSIS — I700 Atherosclerosis of aorta: Secondary | ICD-10-CM | POA: Diagnosis not present

## 2024-01-05 DIAGNOSIS — R079 Chest pain, unspecified: Secondary | ICD-10-CM

## 2024-01-06 DIAGNOSIS — K080 Exfoliation of teeth due to systemic causes: Secondary | ICD-10-CM | POA: Diagnosis not present

## 2024-01-09 ENCOUNTER — Telehealth: Payer: Self-pay

## 2024-01-09 NOTE — Telephone Encounter (Signed)
 Will keep an eye out for when this is resulted

## 2024-01-09 NOTE — Telephone Encounter (Signed)
 Copied from CRM #[id number]. Topic: General - Call Back - No Documentation >> Jan 09, 2024  1:11 PM Leah C wrote: Reason for CRM: Patient called in to follow up on Ct Chest scan. Advised patient results werent viewed just yet but patient will appreciate a call when they are viewed.

## 2024-01-15 ENCOUNTER — Ambulatory Visit: Payer: Self-pay

## 2024-01-15 ENCOUNTER — Telehealth: Payer: Self-pay

## 2024-01-15 ENCOUNTER — Ambulatory Visit: Payer: Self-pay | Admitting: Internal Medicine

## 2024-01-15 ENCOUNTER — Encounter: Payer: Self-pay | Admitting: Internal Medicine

## 2024-01-15 DIAGNOSIS — J479 Bronchiectasis, uncomplicated: Secondary | ICD-10-CM

## 2024-01-15 DIAGNOSIS — R1310 Dysphagia, unspecified: Secondary | ICD-10-CM

## 2024-01-15 NOTE — Telephone Encounter (Signed)
 Pt calling in stating that he is still having the abd pain and also trouble swallowing, states that he can swallow but its like food goes down so far and then stops and he has to drink something to help with it or eat smaller amounts. States that this has been ongoing for a while and he forgot to mention it to Dr. Norleen at his last visit. Offered an appt for today and pt declined and states that he would like to avoid another visit.

## 2024-01-15 NOTE — Telephone Encounter (Signed)
 Copied from CRM [id number]. Topic: Clinical - Lab/Test Results >> Jan 14, 2024  4:08 PM Paige D wrote: Reason for CRM: Pt is calling in regards to his results he stated he reached out last week and was told some one would reach out to him no later than Tuesday and it is now Wednesday. Pt would like a call back in regards to these results .

## 2024-01-15 NOTE — Telephone Encounter (Signed)
 FYI Only or Action Required?: FYI only for provider.  Patient was last seen in primary care on 12/30/2023 by Norleen Lynwood ORN, MD.  Called Nurse Triage reporting Abdominal Pain.  Symptoms began several weeks ago.  Interventions attempted: Nothing.  Symptoms are: unchanged.  Triage Disposition: See PCP Within 2 Weeks  Patient/caregiver understands and will follow disposition?: No, wishes to speak with PCP     Copied from CRM [id number]. Topic: Clinical - Red Word Triage >> Jan 15, 2024 11:49 AM Drema MATSU wrote: Red Word that prompted transfer to Nurse Triage: Patient is still experiencing abdominal pain (upper area below rib cage) and when he eats he can't swallow how he use to. (Gets stuck) Reason for Disposition  Abdominal pain is a chronic symptom (recurrent or ongoing AND present > 4 weeks)  Answer Assessment - Initial Assessment Questions 1. LOCATION: Where does it hurt?      Same as before  2. RADIATION: Does the pain shoot anywhere else? (e.g., chest, back)     no 3. ONSET: When did the pain begin? (Minutes, hours or days ago)      A  while 4. SUDDEN: Gradual or sudden onset?     Gradual  5. PATTERN Does the pain come and go, or is it constant?     constant 6. SEVERITY: How bad is the pain?  (e.g., Scale 1-10; mild, moderate, or severe)     5/10 7. RECURRENT SYMPTOM: Have you ever had this type of stomach pain before? If Yes, ask: When was the last time? and What happened that time?      Has been here for a while 8. CAUSE: What do you think is causing the stomach pain? (e.g., gallstones, recent abdominal surgery)     unknown 9. RELIEVING/AGGRAVATING FACTORS: What makes it better or worse? (e.g., antacids, bending or twisting motion, bowel movement)     none 10. OTHER SYMPTOMS: Do you have any other symptoms? (e.g., back pain, diarrhea, fever, urination pain, vomiting)       Pt states that he is having trouble with food going down. Pt states that  he can't eat as much and has to eat small amount  Protocols used: Abdominal Pain - Male-A-AH

## 2024-01-15 NOTE — Telephone Encounter (Signed)
 Being addressed in separate note. Have since reached out to [facility] reading room to have CT result escalated

## 2024-01-15 NOTE — Telephone Encounter (Signed)
 Called and left detailed message informing patient that exam had still not been resulted but I had reached out to [facility] reading room to escalate the reading. Hoping for results to be in before the end of today.

## 2024-01-16 NOTE — Addendum Note (Signed)
 Addended by: NORLEEN LYNWOOD ORN on: 01/16/2024 10:10 AM   Modules accepted: Orders

## 2024-01-16 NOTE — Telephone Encounter (Signed)
 Ok we should refer to GI for dysphagia  - hopefully he will hear soon.

## 2024-01-16 NOTE — Addendum Note (Signed)
 Addended by: NORLEEN LYNWOOD ORN on: 01/16/2024 09:49 AM   Modules accepted: Orders

## 2024-01-16 NOTE — Telephone Encounter (Signed)
 See below

## 2024-01-16 NOTE — Telephone Encounter (Signed)
 Unable to reach patient. LMTRC

## 2024-01-16 NOTE — Telephone Encounter (Signed)
 Called and spoke with patient. Informed him of findings. Patient is good to start referral to pulmonologist.

## 2024-01-16 NOTE — Telephone Encounter (Signed)
 Imaging resulted and relayed back to patient

## 2024-01-19 NOTE — Telephone Encounter (Unsigned)
 Copied from CRM #[id number]. Topic: General - Other >> Jan 19, 2024 11:15 AM Thersia BROCKS wrote: Reason for CRM: Patient called in regarding a missed call from [place], would like a callback

## 2024-03-04 ENCOUNTER — Encounter: Payer: Self-pay | Admitting: Internal Medicine

## 2024-03-04 ENCOUNTER — Ambulatory Visit (INDEPENDENT_AMBULATORY_CARE_PROVIDER_SITE_OTHER): Admitting: Internal Medicine

## 2024-03-04 VITALS — BP 119/88 | HR 80 | Temp 97.8°F | Ht 72.0 in | Wt 197.0 lb

## 2024-03-04 DIAGNOSIS — J479 Bronchiectasis, uncomplicated: Secondary | ICD-10-CM

## 2024-03-04 DIAGNOSIS — R079 Chest pain, unspecified: Secondary | ICD-10-CM | POA: Diagnosis not present

## 2024-03-04 MED ORDER — PANTOPRAZOLE SODIUM 40 MG PO TBEC: 40.0000 mg | DELAYED_RELEASE_TABLET | Freq: Every day | ORAL | 2 refills | Status: DC

## 2024-03-04 MED ORDER — FAMOTIDINE 20 MG PO TABS: ORAL_TABLET | ORAL | 11 refills | Status: AC

## 2024-03-04 NOTE — Assessment & Plan Note (Addendum)
 See CT cest 01/05/24 involving RML / lingula but not well seen on cxr  s assoc productive cough or pleuritic features so doubt bronchiectasis has anything to do with his chest pain.   Reviewed pathophysiology of bronchiectasis na need to keep up with vaccination per PCP.       Each maintenance medication was reviewed in detail including emphasizing most importantly the difference between maintenance and prns and under what circumstances the prns are to be triggered using an action plan format where appropriate.  Total time for H and P, chart review, counseling,  and generating customized AVS unique to this office visit / same day charting = 45  min new pt eval

## 2024-03-04 NOTE — Patient Instructions (Addendum)
 Classic subdiaphragmatic pain pattern suggests ibs:    very limited distribution of pain locations, daytime, not usually exacerbated by exercise  or coughing, worse in sitting position, frequently associated with generalized abd bloating, not as likely to be present supine due to the dome effect of the diaphragm which  is  canceled in that position. Frequently these patients have had multiple negative GI workups and CT scans.  Treatment consists of avoiding foods that cause gas (especially boiled eggs, mexcican food but especially  beans and undercooked vegetables like  spinach and some salads)  and citrucel 1 heaping tbsp twice daily with a large glass of water.  Pain should improve w/in 2 weeks and if not then consider further GI work up.  Pantoprazole  (protonix ) 40 mg   Take  30-60 min before first meal of the day and Pepcid (famotidine)  20 mg after supper for at least a month  GERD (REFLUX)  is an extremely common cause of respiratory symptoms just like yours , many times with no obvious heartburn at all.    It can be treated with medication, but also with lifestyle changes including elevation of the head of your bed (ideally with 6 -8inch blocks under the headboard of your bed),  Smoking cessation, avoidance of late meals, excessive alcohol, and avoid fatty foods, chocolate, peppermint, colas, red wine, and acidic juices such as orange juice.  NO MINT OR MENTHOL PRODUCTS SO NO COUGH DROPS  USE SUGARLESS CANDY INSTEAD (Jolley ranchers or Stover's or Life Savers) or even ice chips will also do - the key is to swallow to prevent all throat clearing. NO OIL BASED VITAMINS - use powdered substitutes.  Avoid fish oil when coughing.    My office will be contacting you by phone for referral to ultrasound   - if you don't hear back from my office within one week please call us  back or notify us  thru MyChart and we'll address it right away.   Bronchiectasis = scarring in bronchial tubes    Call if  not better in two weeks

## 2024-03-04 NOTE — Progress Notes (Signed)
 [name] [name], male    DOB: April 12, 1961   MRN: [id number]   Brief patient profile:  63 yobm  healthy child smoked cigs/ MJ but stopped while still a teenager with no resp symptoms until around 2006  onset afib/fatigue > sob and still occurrying up to 2 x  a  day or two   one month out of every month or 3-5 (quite variable)   referred to pulmonary clinic 03/04/2024 by Lynwood Rush  for unexplained  R localized  ant CP x spring 2025   s radiation or pleuritic component with w/u significant for RML/ lingula scarring / bronchiectasis   Pt not previously seen by PCCM service.    History of Present Illness  03/04/2024  Pulmonary/ 1st office eval/Nancylee Gaines maint on eliquis   Dyspnea:  jogs up to a block or two as long as not in afib s cp or limiting doe  Cough: none  R CP not pleuritic and  worse when lying down decubitus positions  on same side but pain can move to the other side if flips over/ bending over induces worse pain and lying flat on back no pain but doesn't like to sleep that way/ no change with meals or bms/ no nausea, fatty food intolerance  SABA use:  02 ldz:[id number]    Dysphagia    does btter with small bites Voice changes Has tried ppi but not ac    No obvious day to day or daytime pattern/variability or assoc excess/ purulent sputum or mucus plugs or hemoptysis or   chest tightness, subjective wheeze or overt sinus or hb symptoms.    Also denies any obvious fluctuation of symptoms with weather or environmental changes or other aggravating or alleviating factors except as outlined above   No unusual exposure hx or h/o childhood pna/ asthma or knowledge of premature birth.  Current Allergies, Complete Past Medical History, Past Surgical History, Family History, and Social History were reviewed in [employer] record.  ROS  The following are not active complaints unless bolded Hoarseness, sore throat, dysphagia, dental problems, itching, sneezing,  nasal congestion or  discharge of excess mucus or purulent secretions, ear ache,   fever, chills, sweats, unintended wt loss or wt gain, classically pleuritic or exertional cp,  orthopnea pnd or arm/hand swelling  or leg swelling, presyncope, palpitations, abdominal pain, anorexia, nausea, vomiting, diarrhea  or change in bowel habits or change in bladder habits, change in stools or change in urine, dysuria, hematuria,  rash, arthralgias, visual complaints, headache, numbness, weakness or ataxia or problems with walking or coordination,  change in mood or  memory.             Outpatient Medications Prior to Visit  Medication Sig Dispense Refill   albuterol  (VENTOLIN  HFA) 108 (90 Base) MCG/ACT inhaler Inhale 2 puffs into the lungs every 6 (six) hours as needed for wheezing or shortness of breath. 8 g 1   apixaban  (ELIQUIS ) 5 MG TABS tablet TAKE 1 TABLET BY MOUTH TWICE DAILY. APPOINTMENT REQUIRED FOR FURTHER REFILLS [phone]. 180 tablet 3   Arginine 500 MG CAPS Take 500 mg by mouth daily.     Ascorbic Acid (VITAMIN C) 1000 MG tablet Take 1,000 mg by mouth daily.      atorvastatin  (LIPITOR) 20 MG tablet Take 1 tablet (20 mg total) by mouth daily. 90 tablet 3   CHOLECALCIFEROL PO Take 2,000 mcg by mouth in the morning and at bedtime.     diltiazem  (CARDIZEM   CD) 120 MG 24 hr capsule Take 1 capsule (120 mg total) by mouth daily. 90 capsule 3   diltiazem  (CARDIZEM ) 30 MG tablet Take 1 tablet every 4 hours AS NEEDED for heart rate over 100 45 tablet 2   dofetilide  (TIKOSYN ) 250 MCG capsule Take 1 capsule (250 mcg total) by mouth 2 (two) times daily. 180 capsule 2   doxazosin  (CARDURA ) 4 MG tablet Take 1 tablet (4 mg total) by mouth at bedtime. 90 tablet 3   irbesartan  (AVAPRO ) 300 MG tablet Take 1 tablet (300 mg total) by mouth daily. 90 tablet 3   loratadine  (CLARITIN ) 10 MG tablet Take 1 tablet (10 mg total) by mouth daily. 90 tablet 3   Lysine HCl 500 MG TABS Take 500 mg by mouth daily.      Multiple  Vitamins-Minerals (MULTIVITAL) tablet Take 1 tablet by mouth daily.     nitroGLYCERIN  (NITROSTAT ) 0.4 MG SL tablet Place 1 tablet (0.4 mg total) under the tongue every 5 (five) minutes as needed for chest pain. 30 tablet 0   polyethylene glycol powder (GLYCOLAX /MIRALAX ) 17 GM/SCOOP powder Take 17 g by mouth 2 (two) times daily as needed. 3350 g 1   potassium chloride  (KLOR-CON ) 10 MEQ tablet Take 1 tablet (10 mEq total) by mouth daily. 90 tablet 3   triamcinolone  (NASACORT ) 55 MCG/ACT AERO nasal inhaler Place 2 sprays into the nose daily. 1 each 12   No facility-administered medications prior to visit.    Past Medical History:  Diagnosis Date   A-fib [facility])    on Eliquis    CHEST PAIN-PRECORDIAL 05/25/2009   ERECTILE DYSFUNCTION, ORGANIC 01/16/2010   GERD (gastroesophageal reflux disease)    hx of   HYPERLIPIDEMIA 12/15/2007   on meds   HYPERTENSION 12/15/2007   on meds   HYPOKALEMIA 12/15/2007   Internal hemorrhoids    Persistent atrial fibrillation (HCC)    a. prior ablation/flecainide ; placed on Tikosyn  07/2017.   POSTURAL LIGHTHEADEDNESS 07/06/2008   Seasonal allergies    Sinus bradycardia    Sleep apnea    does not use CPAP   Visit for monitoring Tikosyn  therapy 07/03/2017      Objective:     BP 119/88   Pulse 80   Temp 97.8 F (36.6 C) (Temporal)   Ht 6' (1.829 m)   Wt 197 lb (89.4 kg)   SpO2 97%   BMI 26.72 kg/m   SpO2: 97 % RA  somewhat somber amb bm nad   HEENT: nl dentition, turbinates bilaterally, and oropharynx. Nl external ear canals without cough reflex   NECK :  without JVD/Nodes/TM/ nl carotid upstrokes bilaterally   LUNGS: no acc muscle use,  Nl contour chest which is clear to A and P bilaterally without cough on insp or exp maneuvers   CV:  RRR  no s3 or murmur or increase in P2, and no edema   ABD:  soft and nontender with nl inspiratory excursion in the supine position. No bruits or organomegaly appreciated, bowel sounds nl  MS:  Nl  gait/ ext warm without deformities, calf tenderness, cyanosis or clubbing No obvious joint restrictions   SKIN: warm and dry without lesions    NEURO:  alert, approp, nl sensorium with  no motor or cerebellar deficits apparent.          I personally reviewed images and agree with radiology impression as follows:   Chest CT w/o contrast 01/05/24      No pleural effusion or pneumothorax. Stable mild  chronic right middle lobe bronchiectasis with mild scarring in the right middle lobe and lingula. No confluent airspace disease or suspicious pulmonary nodularity. No assoc pleural effusion          Assessment     Assessment & Plan Chest pain, unspecified type Onset was spring 2025 with only pos finding = chest CT 01/05/24  RML> linglar minimal Bronchiectasis scarring  - GB U/S 03/04/2024 >>>  - 03/04/2024 max gerd/ IBS rx >>>  Cp is very atypical and positional component strongly suggests   either MSCP or IBS with hepatic flexure syndrome  (though sometimes in certain positions has similar pain on left c/w splenic flexure syndrome - but also assoc dysphagia worrisome for GERD /LPR   Rec max empirical gerd/ IBS rx x 2 weeks then regroup if not improved.   >>> r/o GB source for pain but no other suspects identified at this point despite exhaustive  hx   Discussed in detail all the  indications, usual  risks and alternatives  relative to the benefits with patient who agrees to proceed with Rx as outlined.       Bronchiectasis without complication (HCC) See CT cest 01/05/24 involving RML / lingula but not well seen on cxr  s assoc productive cough or pleuritic features so doubt bronchiectasis has anything to do with his chest pain.   Reviewed pathophysiology of bronchiectasis na need to keep up with vaccination per PCP.       Each maintenance medication was reviewed in detail including emphasizing most importantly the difference between maintenance and prns and under what circumstances  the prns are to be triggered using an action plan format where appropriate.  Total time for H and P, chart review, counseling,  and generating customized AVS unique to this office visit / same day charting = 45  min new pt eval           AVS  Patient Instructions  Classic subdiaphragmatic pain pattern suggests ibs:    very limited distribution of pain locations, daytime, not usually exacerbated by exercise  or coughing, worse in sitting position, frequently associated with generalized abd bloating, not as likely to be present supine due to the dome effect of the diaphragm which  is  canceled in that position. Frequently these patients have had multiple negative GI workups and CT scans.  Treatment consists of avoiding foods that cause gas (especially boiled eggs, mexcican food but especially  beans and undercooked vegetables like  spinach and some salads)  and citrucel 1 heaping tbsp twice daily with a large glass of water.  Pain should improve w/in 2 weeks and if not then consider further GI work up.  Pantoprazole  (protonix ) 40 mg   Take  30-60 min before first meal of the day and Pepcid (famotidine)  20 mg after supper for at least a month  GERD (REFLUX)  is an extremely common cause of respiratory symptoms just like yours , many times with no obvious heartburn at all.    It can be treated with medication, but also with lifestyle changes including elevation of the head of your bed (ideally with 6 -8inch blocks under the headboard of your bed),  Smoking cessation, avoidance of late meals, excessive alcohol, and avoid fatty foods, chocolate, peppermint, colas, red wine, and acidic juices such as orange juice.  NO MINT OR MENTHOL PRODUCTS SO NO COUGH DROPS  USE SUGARLESS CANDY INSTEAD (Jolley ranchers or Stover's or Life Savers) or even ice chips will also  do - the key is to swallow to prevent all throat clearing. NO OIL BASED VITAMINS - use powdered substitutes.  Avoid fish oil when coughing.     My office will be contacting you by phone for referral to ultrasound   - if you don't hear back from my office within one week please call us  back or notify us  thru MyChart and we'll address it right away.   Bronchiectasis = scarring in bronchial tubes    Call if not better in two weeks          Ozell America, MD 03/04/2024

## 2024-03-04 NOTE — Assessment & Plan Note (Addendum)
 Onset was spring 2025 with only pos finding = chest CT 01/05/24  RML> linglar minimal Bronchiectasis scarring  - GB U/S 03/04/2024 >>>  - 03/04/2024 max gerd/ IBS rx >>>  Cp is very atypical and positional component strongly suggests   either MSCP or IBS with hepatic flexure syndrome  (though sometimes in certain positions has similar pain on left c/w splenic flexure syndrome - but also assoc dysphagia worrisome for GERD /LPR   Rec max empirical gerd/ IBS rx x 2 weeks then regroup if not improved.   >>> r/o GB source for pain but no other suspects identified at this point despite exhaustive  hx   Discussed in detail all the  indications, usual  risks and alternatives  relative to the benefits with patient who agrees to proceed with Rx as outlined.

## 2024-03-11 ENCOUNTER — Telehealth: Payer: Self-pay | Admitting: *Deleted

## 2024-03-11 NOTE — Telephone Encounter (Signed)
 Spoke with patient regarding the Monday // : am abdominal U/S appointment at [facility] Arrival time is 9:45 am for check in and NPO after midnight.  Patient is aware of the location and he voiced his understanding

## 2024-03-15 ENCOUNTER — Ambulatory Visit: Payer: Self-pay

## 2024-03-15 ENCOUNTER — Ambulatory Visit (HOSPITAL_BASED_OUTPATIENT_CLINIC_OR_DEPARTMENT_OTHER)
Admission: RE | Admit: 2024-03-15 | Discharge: 2024-03-15 | Disposition: A | Discharge status: Home / Self Care | Source: Ambulatory Visit | Attending: Internal Medicine | Admitting: Internal Medicine

## 2024-03-15 DIAGNOSIS — R079 Chest pain, unspecified: Secondary | ICD-10-CM | POA: Diagnosis not present

## 2024-03-15 NOTE — Telephone Encounter (Signed)
 Patient called into Pulmonary triage to report diarrhea for the last five days. Patient states he was told to take Citrucel at his last appointment with pulmonary. Patient states he started having diarrhea and stopped the Citrucel but the diarrhea continued. Patient endorses some crampy pain he has to go but states pain goes away. Reports attempting to eat lightly to help with the diarrhea and reports drinking fluids. Educated on OTC medications to help with diarrhea. Patient verbalized understanding and all questions answered.  Patient recommended to Home Care at this time-instructed to call back if symptoms worsen.  FYI Only or Action Required?: FYI only for provider.  Patient is followed in Pulmonology for Bronchiectasis, last seen on 03/04/2024 by Darlean Ozell NOVAK, MD.  Called Nurse Triage reporting Diarrhea.  Symptoms began 5 days ago.  Interventions attempted: Increased fluids/rest.  Symptoms are: gradually improving.  Triage Disposition: Home Care  Patient/caregiver understands and will follow disposition?: Yes  Copied from CRM [id number]. Topic: Clinical - Red Word Triage >> Mar 15, 2024 10:49 AM Russell PARAS wrote: Red Word that prompted transfer to Nurse Triage:   Pt having chronic diarrhea for past 5 days Saw [facility] about 2 weeks ago and was advised to take 2 tablespoons of Citrucel 2 X a day. Stopped Citrucel but continuing to have diarrhea Feels weakness and lightheaded  Pt of [facility] Reason for Disposition  MILD-MODERATE diarrhea (e.g., 1-6 times / day more than normal)  Answer Assessment - Initial Assessment Questions 1. DIARRHEA SEVERITY: How bad is the diarrhea? How many more stools have you had in the past 24 hours than normal?      Multiple times a day 2. ONSET: When did the diarrhea begin?      5 days ago 3. STOOL DESCRIPTION:  How loose or watery is the diarrhea? What is the stool color? Is there any blood or mucous in the stool?     Loose and at times  watery 4. VOMITING: Are you also vomiting? If Yes, ask: How many times in the past 24 hours?      no 5. ABDOMEN PAIN: Are you having any abdomen pain? If Yes, ask: What does it feel like? (e.g., crampy, dull, intermittent, constant)      Yes-crampy 6. ABDOMEN PAIN SEVERITY: If present, ask: How bad is the pain?  (e.g., Scale 1-10; mild, moderate, or severe)     moderate 7. ORAL INTAKE: If vomiting, Have you been able to drink liquids? How much liquids have you had in the past 24 hours?     Drinking plenty of liquids 8. HYDRATION: Any signs of dehydration? (e.g., dry mouth [not just dry lips], too weak to stand, dizziness, new weight loss) When did you last urinate?     Having weakness, dry mouth at times 9. EXPOSURE: Have you traveled to a foreign country recently? Have you been exposed to anyone with diarrhea? Could you have eaten any food that was spoiled?     no 10. ANTIBIOTIC USE: Are you taking antibiotics now or have you taken antibiotics in the past 2 months?       no 11. OTHER SYMPTOMS: Do you have any other symptoms? (e.g., fever, blood in stool)       Weakness, lightheadedness  Protocols used: Diarrhea-A-AH

## 2024-03-15 NOTE — Telephone Encounter (Signed)
 Pt would need OV if has fever, worsening pain, bleeding, worsening or persistent volume of diarrhea, and falls/dizziness.   O/w ok to take OTC Probiotic such as Align.  Most diarrhea take over 1 wk to resolve, sometimes even 3-4 wks.   thanks

## 2024-03-16 ENCOUNTER — Ambulatory Visit: Payer: Self-pay | Admitting: Internal Medicine

## 2024-03-17 ENCOUNTER — Ambulatory Visit: Payer: Self-pay

## 2024-03-17 NOTE — Telephone Encounter (Signed)
 Copied from CRM [id number]. Topic: General - Other >> Mar 17, 2024  1:42 PM Ismael A wrote: Reason for CRM: patient called back returning call from [facility] I relayed the following message  Dear Mr. Ferger, We have tried to reach you today regarding your ultrasound results, but have unfortunately been unsuccessful. Per Dr. Darlean:   Study is unremarkable, no change in recs   This is good news! If you have any questions, please do not hesitate to give our office a call at [phone]  He verbalized understanding and having no questions in regards  Patient aware.no further questions.NFN

## 2024-03-17 NOTE — Progress Notes (Signed)
 ATCx1, LVMTCB. Sent MyChart message

## 2024-03-17 NOTE — Telephone Encounter (Signed)
 Called patient and advised him per provider request to take a probiotic and if symptoms worsen and still have not gotten better in a week or two to make an appointment to see us  or go to ED if cannot be seen with us  within 24-48 hours pt verbalized he understood

## 2024-03-17 NOTE — Telephone Encounter (Signed)
 FYI Only or Action Required?: FYI only for provider.  Patient was last seen in primary care on 12/30/2023 by Norleen Lynwood ORN, MD.  Called Nurse Triage reporting Medication question.  Symptoms began today.  Interventions attempted: OTC medications: probiotic.  Symptoms are: stable.  Triage Disposition: Information or Advice Only Call  Patient/caregiver understands and will follow disposition?: Yes    Copied from CRM [id number]. Topic: Clinical - Medical Advice >> Mar 17, 2024  3:15 PM [name] [name] wrote: Reason for CRM: Pt states he was just talking to a triage nurse and thought of some additional questions he wanted to ask. Upon looking, I see he was triaged on 03/15/24 for diarrhea. Reason for Disposition  Caller has medicine question only, adult not sick, AND triager answers question  Answer Assessment - Initial Assessment Questions 1. NAME of MEDICINE: What medicine(s) are you calling about?     Probiotic 2. QUESTION: What is your question? (e.g., double dose of medicine, side effect)     Can I take with other medicines I'm on? 3. PRESCRIBER: Who prescribed the medicine? Reason: if prescribed by specialist, call should be referred to that group.     PCP 4. SYMPTOMS: Do you have any symptoms? If Yes, ask: What symptoms are you having?  How bad are the symptoms (e.g., mild, moderate, severe)     diarrhea 5. PREGNANCY:  Is there any chance that you are pregnant? When was your last menstrual period?     N/a  Protocols used: Medication Question Call-A-AH

## 2024-05-04 ENCOUNTER — Ambulatory Visit
Attending: Student in an Organized Health Care Education/Training Program | Admitting: Student in an Organized Health Care Education/Training Program

## 2024-05-04 ENCOUNTER — Encounter: Payer: Self-pay | Admitting: Student in an Organized Health Care Education/Training Program

## 2024-05-04 ENCOUNTER — Other Ambulatory Visit (HOSPITAL_COMMUNITY)
Admission: RE | Admit: 2024-05-04 | Discharge: 2024-05-04 | Disposition: A | Source: Ambulatory Visit | Attending: Student in an Organized Health Care Education/Training Program | Admitting: Student in an Organized Health Care Education/Training Program

## 2024-05-04 VITALS — BP 124/86 | HR 81 | Ht 72.0 in | Wt 200.0 lb

## 2024-05-04 DIAGNOSIS — Z79899 Other long term (current) drug therapy: Secondary | ICD-10-CM

## 2024-05-04 DIAGNOSIS — I4892 Unspecified atrial flutter: Secondary | ICD-10-CM | POA: Diagnosis not present

## 2024-05-04 LAB — BASIC METABOLIC PANEL WITH GFR
Anion gap: 11 (ref 5–15)
BUN: 19 mg/dL (ref 8–23)
CO2: 22 mmol/L (ref 22–32)
Calcium: 9.3 mg/dL (ref 8.9–10.3)
Chloride: 108 mmol/L (ref 98–111)
Creatinine, Ser: 1.29 mg/dL — ABNORMAL HIGH (ref 0.61–1.24)
GFR, Estimated: >60 mL/min (ref >60)
Glucose, Bld: 103 mg/dL — ABNORMAL HIGH (ref 70–99)
Potassium: 4 mmol/L (ref 3.5–5.1)
Sodium: 141 mmol/L (ref 135–145)

## 2024-05-04 LAB — MAGNESIUM: Magnesium: 2 mg/dL (ref 1.7–2.4)

## 2024-05-04 NOTE — Patient Instructions (Signed)
 Medication Instructions:  Your physician recommends that you continue on your current medications as directed. Please refer to the Current Medication list given to you today.  *If you need a refill on your cardiac medications before your next appointment, please call your pharmacy*  Lab Work: Your physician recommends that you return for lab work in: Today   Please have this done at [facility] (hours-Monday through Friday from 8:00 am to 4:00 pm except 11:30 am to 12:10 pm)   If you have labs (blood work) drawn today and your tests are completely normal, you will receive your results only by: MyChart Message (if you have MyChart) OR A paper copy in the mail If you have any lab test that is abnormal or we need to change your treatment, we will call you to review the results.  Testing/Procedures: Your physician has requested that you have an echocardiogram. Echocardiography is a painless test that uses sound waves to create images of your heart. It provides your doctor with information about the size and shape of your heart and how well your heart's chambers and valves are working. This procedure takes approximately one hour. There are no restrictions for this procedure. Please do NOT wear cologne, perfume, aftershave, or lotions (deodorant is allowed). Please arrive 15 minutes prior to your appointment time.  Please note: We ask at that you not bring children with you during ultrasound (echo/ vascular) testing. Due to room size and safety concerns, children are not allowed in the ultrasound rooms during exams. Our front office staff cannot provide observation of children in our lobby area while testing is being conducted. An adult accompanying a patient to their appointment will only be allowed in the ultrasound room at the discretion of the ultrasound technician under special circumstances. We apologize for any inconvenience.   Follow-Up: At [facility], you and your health  needs are our priority.  As part of our continuing mission to provide you with exceptional heart care, our providers are all part of one team.  This team includes your primary Cardiologist (physician) and Advanced Practice Providers or APPs (Physician Assistants and Nurse Practitioners) who all work together to provide you with the care you need, when you need it.  Your next appointment:   6 month(s)  Provider:   Donnice Primus, MD    We recommend signing up for the patient portal called MyChart.  Sign up information is provided on this After Visit Summary.  MyChart is used to connect with patients for Virtual Visits (Telemedicine).  Patients are able to view lab/test results, encounter notes, upcoming appointments, etc.  Non-urgent messages can be sent to your provider as well.   To learn more about what you can do with MyChart, go to [email or web address].   Other Instructions Thank you for choosing [facility]!

## 2024-05-04 NOTE — Progress Notes (Unsigned)
 Cardiology Office Note   Date:  05/04/2024  ID:  [name] [name], DOB 04-Sep-1960, MRN [id number] PCP: Norleen Lynwood ORN, MD  [facility] Providers Cardiologist:  Lynwood Rakers, MD (Inactive) Electrophysiologist:  Donnice DELENA Primus, MD    History of Present Illness [name] [name] is a 63 y.o. male with persistent AF, HTN, HLD, and OSA who presents for AF management.  History of Present Illness [name] [name] is a 63 year old male with atrial fibrillation who presents for evaluation of his arrhythmia management.  He experiences episodes of atrial fibrillation a couple of times a month, lasting up to a day, during which he feels tired and can detect irregular heartbeats. These episodes resolve spontaneously. He has undergone two prior ablations, which provided temporary relief. The first ablation was short-lived, while the second lasted longer but eventually, symptoms returned. He feels that Tikosyn  has been effective in managing his condition to a good extent.  The atrial fibrillation does not significantly impact his daily activities, although he occasionally feels tired. He is able to perform tasks such as yard work but tends to rest more on days when he feels fatigued.  He currently takes diltiazem  120 mg once daily to help manage his heart rate and has a prescription for an additional dose if his heart rate exceeds 100 bpm. He uses a pulse oximeter to monitor his heart rate at home.  He has a family history of heart issues, with his mother having had heart problems. He believes he is the only one with atrial fibrillation.  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 He lives in [place] with his wife and has a son and daughter living in [place]. His daughter works from home and visits frequently.  ROS: palpitations, fatigue  Studies Reviewed  ECG review 05/04/24: AF/VR 81, QRS 84, QT/c 384/446 11/14/23: SB 53, PR  156, QRS 82, QT/c 510/478  05/23/23: SB 52, PR 166, QRS 82, QT/c 482/448 05/16/23: SB 54, PR 158, QRS 88, QT/c 540/512 05/08/23: AF/RVR 120, QRS 84, QT/c 336/474 10/31/22: SB 50, PR 168, QRS 82, QT/c 520/474  Risk Assessment/Calculations  CHA2DS2-VASc Score = 1   This indicates a 0.6% annual risk of stroke. The patient's score is based upon: CHF History: 0 HTN History: 1 Diabetes History: 0 Stroke History: 0 Vascular Disease History: 0 Age Score: 0 Gender Score: 0   Physical Exam VS:  BP 124/86 (BP Location: Left Arm, Cuff Size: Large)   Pulse 81   Ht 6' (1.829 m)   Wt 200 lb (90.7 kg)   SpO2 97%   BMI 27.12 kg/m   Wt Readings from Last 3 Encounters:  05/04/24 200 lb (90.7 kg)  03/04/24 197 lb (89.4 kg)  12/30/23 204 lb 9.6 oz (92.8 kg)    GEN: Well nourished, well developed in no acute distress NECK: No JVD; No carotid bruits CARDIAC: irregular rhythm, rate controlled, no murmurs, rubs, gallops EXTREMITIES:  No edema; No deformity   ASSESSMENT AND PLAN Assessment & Plan Paroxysmal atrial fibrillation and atrial flutter Intermittent atrial fibrillation and flutter with fatigue and palpitations. Previous ablations provided temporary relief. Current dofetilide  therapy offers partial control. Discussed repeat ablation with improved technology but noted decreased yield. Explained risks of ablation. Emphasized shared decision-making considering age and symptoms.  We reviewed different options for management of his AF.  With his young age and reasonably high burden of AF despite antiarrhythmic medication  I would consider a third ablation with PFA.  I reviewed his prior to ablations and he had reconnection of 3 of the 4 PV prior to redo RF ablation.  I think there is reasonable likelihood that he has reconnection of prior veins from 2018 and potentially posterior wall that could be better treated with PFA.  He understands that there are diminishing returns that with repeat ablation.   He is going to talk with his family and decide whether he wants to continue course with dofetilide  versus considering repeat ablation.  He understands that even with ablation he probably will still need to be on antiarrhythmic medication to have reasonable control of his AF.  Discussed treatment options today for AF including antiarrhythmic drug therapy and ablation. Discussed risks, recovery and likelihood of success with each treatment strategy. Risk, benefits, and alternatives to EP study and ablation for afib were discussed. These risks include but are not limited to stroke, bleeding, vascular damage, tamponade, perforation, damage to the esophagus, lungs, phrenic nerve and other structures, worsening renal function, coronary vasospasm and death.  Discussed potential need for repeat ablation procedures and antiarrhythmic drugs after an initial ablation. The patient understands these risk and is going to consider his options. - Ordered echocardiogram to assess heart function and chamber size. - Continue dofetilide  for rhythm control. - Discuss with wife about potential repeat ablation. - Scheduled follow-up call to discuss results and potential ablation plan.  Dispo: RTC 6 months, in AF today but paroxysmal so no DCCV   A total of 45 minutes was spent preparing for the patient, reviewing history, performing exam, document encounter, coordinating care and counseling the patient. 30 minutes was spent with direct patient care.   Signed, Donnice DELENA Primus, MD

## 2024-05-07 ENCOUNTER — Ambulatory Visit: Admitting: Student in an Organized Health Care Education/Training Program

## 2024-05-10 ENCOUNTER — Other Ambulatory Visit (HOSPITAL_COMMUNITY): Payer: Self-pay | Admitting: Physician Assistant

## 2024-05-23 ENCOUNTER — Other Ambulatory Visit: Payer: Self-pay | Admitting: Internal Medicine

## 2024-05-23 DIAGNOSIS — R079 Chest pain, unspecified: Secondary | ICD-10-CM

## 2024-05-25 DIAGNOSIS — N5201 Erectile dysfunction due to arterial insufficiency: Secondary | ICD-10-CM | POA: Diagnosis not present

## 2024-05-25 DIAGNOSIS — R3915 Urgency of urination: Secondary | ICD-10-CM | POA: Diagnosis not present

## 2024-05-25 DIAGNOSIS — N401 Enlarged prostate with lower urinary tract symptoms: Secondary | ICD-10-CM | POA: Diagnosis not present

## 2024-05-25 DIAGNOSIS — R351 Nocturia: Secondary | ICD-10-CM | POA: Diagnosis not present

## 2024-05-31 ENCOUNTER — Ambulatory Visit (HOSPITAL_COMMUNITY)
Admission: RE | Admit: 2024-05-31 | Discharge: 2024-05-31 | Disposition: A | Discharge status: Home / Self Care | Source: Ambulatory Visit | Attending: Student in an Organized Health Care Education/Training Program | Admitting: Student in an Organized Health Care Education/Training Program

## 2024-05-31 ENCOUNTER — Encounter: Payer: Self-pay | Admitting: Internal Medicine

## 2024-05-31 DIAGNOSIS — I4891 Unspecified atrial fibrillation: Secondary | ICD-10-CM | POA: Diagnosis not present

## 2024-05-31 DIAGNOSIS — I4892 Unspecified atrial flutter: Secondary | ICD-10-CM | POA: Insufficient documentation

## 2024-05-31 LAB — ECHOCARDIOGRAM COMPLETE
Area-P 1/2: 4.21 cm2
S' Lateral: 2.3 cm

## 2024-05-31 NOTE — Progress Notes (Signed)
*  PRELIMINARY RESULTS* Echocardiogram 2D Echocardiogram has been performed.  [name] [name] 05/31/2024, 10:27 AM

## 2024-06-02 ENCOUNTER — Ambulatory Visit: Payer: Self-pay | Admitting: Student in an Organized Health Care Education/Training Program
